# Patient Record
Sex: Male | Born: 1985
Health system: Southern US, Community
[De-identification: ages and names within clinical notes are randomized; demographics above are authoritative.]

---

## 2011-10-12 ENCOUNTER — Emergency Department: Payer: Self-pay | Admitting: Emergency Medicine

## 2014-11-16 ENCOUNTER — Emergency Department (HOSPITAL_COMMUNITY): Payer: Self-pay

## 2014-11-16 ENCOUNTER — Encounter (HOSPITAL_COMMUNITY)
Admission: EM | Disposition: A | Discharge status: Home / Self Care | Payer: Self-pay | Source: Home / Self Care | Attending: Emergency Medicine

## 2014-11-16 ENCOUNTER — Observation Stay (HOSPITAL_COMMUNITY)
Admission: EM | Admit: 2014-11-16 | Discharge: 2014-11-17 | Disposition: A | Discharge status: Home / Self Care | Payer: Self-pay | Attending: General Surgery | Admitting: General Surgery

## 2014-11-16 ENCOUNTER — Encounter (HOSPITAL_COMMUNITY): Payer: Self-pay | Admitting: Emergency Medicine

## 2014-11-16 DIAGNOSIS — Y9289 Other specified places as the place of occurrence of the external cause: Secondary | ICD-10-CM | POA: Insufficient documentation

## 2014-11-16 DIAGNOSIS — F17210 Nicotine dependence, cigarettes, uncomplicated: Secondary | ICD-10-CM | POA: Insufficient documentation

## 2014-11-16 DIAGNOSIS — Y9389 Activity, other specified: Secondary | ICD-10-CM | POA: Insufficient documentation

## 2014-11-16 DIAGNOSIS — S0121XA Laceration without foreign body of nose, initial encounter: Principal | ICD-10-CM | POA: Insufficient documentation

## 2014-11-16 DIAGNOSIS — Y998 Other external cause status: Secondary | ICD-10-CM | POA: Insufficient documentation

## 2014-11-16 DIAGNOSIS — T1490XA Injury, unspecified, initial encounter: Secondary | ICD-10-CM

## 2014-11-16 DIAGNOSIS — S41012A Laceration without foreign body of left shoulder, initial encounter: Secondary | ICD-10-CM | POA: Insufficient documentation

## 2014-11-16 DIAGNOSIS — T07XXXA Unspecified multiple injuries, initial encounter: Secondary | ICD-10-CM

## 2014-11-16 DIAGNOSIS — S31119A Laceration without foreign body of abdominal wall, unspecified quadrant without penetration into peritoneal cavity, initial encounter: Secondary | ICD-10-CM | POA: Insufficient documentation

## 2014-11-16 DIAGNOSIS — S21112A Laceration without foreign body of left front wall of thorax without penetration into thoracic cavity, initial encounter: Secondary | ICD-10-CM | POA: Insufficient documentation

## 2014-11-16 HISTORY — PX: LACERATION REPAIR: SHX5284

## 2014-11-16 LAB — CBC
HCT: 42.4 % (ref 39.0–52.0)
HEMOGLOBIN: 14.3 g/dL (ref 13.0–17.0)
MCH: 29.1 pg (ref 26.0–34.0)
MCHC: 33.7 g/dL (ref 30.0–36.0)
MCV: 86.4 fL (ref 78.0–100.0)
Platelets: 206 10*3/uL (ref 150–400)
RBC: 4.91 MIL/uL (ref 4.22–5.81)
RDW: 13.6 % (ref 11.5–15.5)
WBC: 10 10*3/uL (ref 4.0–10.5)

## 2014-11-16 LAB — PREPARE FRESH FROZEN PLASMA
UNIT DIVISION: 0
Unit division: 0

## 2014-11-16 LAB — PROTIME-INR
INR: 1.18 (ref 0.00–1.49)
PROTHROMBIN TIME: 15.2 s (ref 11.6–15.2)

## 2014-11-16 LAB — CDS SEROLOGY

## 2014-11-16 SURGERY — REPAIR, LACERATION, 2 OR MORE
Anesthesia: General | Site: Nose

## 2014-11-16 MED ORDER — FENTANYL CITRATE 0.05 MG/ML IJ SOLN
INTRAMUSCULAR | Status: AC
  Filled 2014-11-16: qty 5

## 2014-11-16 MED ORDER — ARTIFICIAL TEARS OP OINT
TOPICAL_OINTMENT | OPHTHALMIC | Status: AC
  Filled 2014-11-16: qty 3.5

## 2014-11-16 MED ORDER — CLINDAMYCIN PHOSPHATE 600 MG/50ML IV SOLN
600.0000 mg | Freq: Once | INTRAVENOUS | Status: AC
Start: 2014-11-16 — End: 2014-11-17
  Administered 2014-11-16: 600 mg via INTRAVENOUS
  Filled 2014-11-16: qty 50

## 2014-11-16 MED ORDER — TETANUS-DIPHTH-ACELL PERTUSSIS 5-2.5-18.5 LF-MCG/0.5 IM SUSP
0.5000 mL | Freq: Once | INTRAMUSCULAR | Status: AC
  Administered 2014-11-16: 0.5 mL via INTRAMUSCULAR
  Filled 2014-11-16: qty 0.5

## 2014-11-16 MED ORDER — ONDANSETRON HCL 4 MG/2ML IJ SOLN
INTRAMUSCULAR | Status: AC
  Filled 2014-11-16: qty 2

## 2014-11-16 MED ORDER — MIDAZOLAM HCL 2 MG/2ML IJ SOLN
INTRAMUSCULAR | Status: AC
  Filled 2014-11-16: qty 2

## 2014-11-16 MED ORDER — LIDOCAINE-EPINEPHRINE 1 %-1:100000 IJ SOLN
10.0000 mL | Freq: Once | INTRAMUSCULAR | Status: DC
  Filled 2014-11-16: qty 1

## 2014-11-16 MED ORDER — LACTATED RINGERS IV SOLN
INTRAVENOUS | Status: DC
  Administered 2014-11-16: via INTRAVENOUS

## 2014-11-16 MED ORDER — PROPOFOL 10 MG/ML IV BOLUS
INTRAVENOUS | Status: AC
  Filled 2014-11-16: qty 20

## 2014-11-16 MED ORDER — SUCCINYLCHOLINE CHLORIDE 20 MG/ML IJ SOLN
INTRAMUSCULAR | Status: AC
  Filled 2014-11-16: qty 1

## 2014-11-16 MED ORDER — SODIUM CHLORIDE 0.9 % IV SOLN: Freq: Once | INTRAVENOUS | Status: DC

## 2014-11-16 SURGICAL SUPPLY — 31 items
CLEANER TIP ELECTROSURG 2X2 (MISCELLANEOUS) IMPLANT
COVER SURGICAL LIGHT HANDLE (MISCELLANEOUS) ×4 IMPLANT
CRADLE DONUT ADULT HEAD (MISCELLANEOUS) ×4 IMPLANT
DRSG NASOPORE 8CM (GAUZE/BANDAGES/DRESSINGS) ×4 IMPLANT
ELECT COATED BLADE 2.86 ST (ELECTRODE) ×4 IMPLANT
ELECT NEEDLE BLADE 2-5/6 (NEEDLE) IMPLANT
ELECT NEEDLE TIP 2.8 STRL (NEEDLE) ×4 IMPLANT
ELECT REM PT RETURN 9FT ADLT (ELECTROSURGICAL) ×4
ELECTRODE REM PT RTRN 9FT ADLT (ELECTROSURGICAL) ×2 IMPLANT
GAUZE SPONGE 4X4 12PLY STRL (GAUZE/BANDAGES/DRESSINGS) ×4 IMPLANT
GAUZE SPONGE 4X4 16PLY XRAY LF (GAUZE/BANDAGES/DRESSINGS) ×4 IMPLANT
GLOVE BIOGEL PI IND STRL 7.5 (GLOVE) ×2 IMPLANT
GLOVE BIOGEL PI INDICATOR 7.5 (GLOVE) ×2
GLOVE SURG SS PI 7.5 STRL IVOR (GLOVE) ×8 IMPLANT
GOWN STRL REUS W/ TWL LRG LVL3 (GOWN DISPOSABLE) ×6 IMPLANT
GOWN STRL REUS W/TWL LRG LVL3 (GOWN DISPOSABLE) ×6
KIT BASIN OR (CUSTOM PROCEDURE TRAY) ×4 IMPLANT
KIT ROOM TURNOVER OR (KITS) ×4 IMPLANT
NEEDLE HYPO 25GX1X1/2 BEV (NEEDLE) ×8 IMPLANT
NS IRRIG 1000ML POUR BTL (IV SOLUTION) ×4 IMPLANT
PAD ARMBOARD 7.5X6 YLW CONV (MISCELLANEOUS) ×8 IMPLANT
PENCIL BUTTON HOLSTER BLD 10FT (ELECTRODE) ×4 IMPLANT
SUT CHROMIC 4 0 P 3 18 (SUTURE) ×4 IMPLANT
SUT PROLENE 3 0 PS 2 (SUTURE) ×16 IMPLANT
SUT VIC AB 3-0 SH 27 (SUTURE) ×2
SUT VIC AB 3-0 SH 27X BRD (SUTURE) ×2 IMPLANT
SUT VICRYL 4 0 TF (SUTURE) ×4 IMPLANT
SYR CONTROL 10ML LL (SYRINGE) ×8 IMPLANT
TOWEL OR 17X24 6PK STRL BLUE (TOWEL DISPOSABLE) ×12 IMPLANT
TRAY CATH 16FR W/PLASTIC CATH (SET/KITS/TRAYS/PACK) ×4 IMPLANT
TRAY ENT MC OR (CUSTOM PROCEDURE TRAY) ×4 IMPLANT

## 2014-11-16 NOTE — Anesthesia Preprocedure Evaluation (Signed)
Anesthesia Evaluation  Patient identified by MRN, date of birth, ID band Patient awake    Reviewed: Allergy & Precautions, NPO status , Patient's Chart, lab work & pertinent test results  Airway Mallampati: II  TM Distance: >3 FB Neck ROM: Full    Dental no notable dental hx.    Pulmonary Current Smoker,  breath sounds clear to auscultation  Pulmonary exam normal       Cardiovascular negative cardio ROS  Rhythm:Regular Rate:Normal     Neuro/Psych negative neurological ROS  negative psych ROS   GI/Hepatic negative GI ROS, Neg liver ROS,   Endo/Other  negative endocrine ROS  Renal/GU negative Renal ROS     Musculoskeletal negative musculoskeletal ROS (+)   Abdominal   Peds  Hematology negative hematology ROS (+)   Anesthesia Other Findings   Reproductive/Obstetrics negative OB ROS                             Anesthesia Physical Anesthesia Plan  ASA: II  Anesthesia Plan: General   Post-op Pain Management:    Induction: Intravenous, Rapid sequence and Cricoid pressure planned  Airway Management Planned: Oral ETT  Additional Equipment: None  Intra-op Plan:   Post-operative Plan: Extubation in OR  Informed Consent: I have reviewed the patients History and Physical, chart, labs and discussed the procedure including the risks, benefits and alternatives for the proposed anesthesia with the patient or authorized representative who has indicated his/her understanding and acceptance.   Dental advisory given  Plan Discussed with: CRNA  Anesthesia Plan Comments:         Anesthesia Quick Evaluation

## 2014-11-16 NOTE — ED Notes (Signed)
P[facility] E[facility]
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
## 2014-11-16 NOTE — H&P (Signed)
11/16/2014  Michael Lester, Michael Lester  PREOPERATIVE HISTORY AND PHYSICAL/CONSULT NOTE  CHIEF COMPLAINT: nasal laceration  HISTORY: This is a 29 year old who presents with complex nasal laceration today.  He now presents for complex repair nasal laceration.  Dr. Emeline DarlingGore, Clovis RileyMitchell has discussed the risks (bleeding, infection, scarring, etc.), benefits, and alternatives of this procedure. The patient understands the risks and would like to proceed with the procedure. The chances of success of the procedure are >50% and the patient understands this. I personally performed an examination of the patient within 24 hours of the procedure.  PAST MEDICAL HISTORY: History reviewed. No pertinent past medical history.  PAST SURGICAL HISTORY: History reviewed. No pertinent past surgical history.  MEDICATIONS: No current facility-administered medications on file prior to encounter.   No current outpatient prescriptions on file prior to encounter.     ALLERGIES: Not on File    SOCIAL HISTORY: History   Social History  . Marital Status: N/A    Spouse Name: N/A    Number of Children: N/A  . Years of Education: N/A   Occupational History  . Not on file.   Social History Main Topics  . Smoking status: Current Some Day Smoker  . Smokeless tobacco: Not on file  . Alcohol Use: Yes  . Drug Use: No  . Sexual Activity: Not on file   Other Topics Concern  . Not on file   Social History Narrative  . No narrative on file    FAMILY HISTORY:No family history on file.  REVIEW OF SYSTEMS:  HEENT: nasal pain, otherwise negative x 12 systems except per HPI  PHYSICAL EXAM:  GENERAL:  NAD VITAL SIGNS:   Filed Vitals:   11/16/14 2305  BP: 120/71  Temp: 98.4 F (36.9 C)  Resp: 14   SKIN:  Warm, dry HEENT:  3cm nasal laceration NECK:  siupple LYMPH:  No LAD ABDOMEN:  soft MUSCULOSKELETAL: normal strength PSYCH:  Normal affect NEUROLOGIC:  CN 2-12 intact and symmetric   ASSESSMENT AND  PLAN: Plan to proceed with complex repair of nasal laceration: discussed the risks, benefits, and alternatives. Informed written consent signed witnessed and on chart. 11/16/2014  11:37 PM Michael Lester, Michael Lester
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
## 2014-11-17 ENCOUNTER — Emergency Department (HOSPITAL_COMMUNITY): Payer: Self-pay | Admitting: Anesthesiology

## 2014-11-17 ENCOUNTER — Encounter (HOSPITAL_COMMUNITY): Payer: Self-pay | Admitting: Otolaryngology

## 2014-11-17 DIAGNOSIS — T07XXXA Unspecified multiple injuries, initial encounter: Secondary | ICD-10-CM

## 2014-11-17 LAB — TYPE AND SCREEN
ABO/RH(D): O POS
Antibody Screen: NEGATIVE
UNIT DIVISION: 0
Unit division: 0

## 2014-11-17 LAB — COMPREHENSIVE METABOLIC PANEL
ALBUMIN: 4.2 g/dL (ref 3.5–5.2)
ALT: 15 U/L (ref 0–53)
AST: 25 U/L (ref 0–37)
Alkaline Phosphatase: 84 U/L (ref 39–117)
Anion gap: 23 — ABNORMAL HIGH (ref 5–15)
BILIRUBIN TOTAL: 0.4 mg/dL (ref 0.3–1.2)
BUN: 15 mg/dL (ref 6–23)
CHLORIDE: 103 meq/L (ref 96–112)
CO2: 11 mmol/L — ABNORMAL LOW (ref 19–32)
Calcium: 8.9 mg/dL (ref 8.4–10.5)
Creatinine, Ser: 1.53 mg/dL — ABNORMAL HIGH (ref 0.50–1.35)
GFR calc Af Amer: 69 mL/min — ABNORMAL LOW (ref >90)
GFR calc non Af Amer: 60 mL/min — ABNORMAL LOW (ref >90)
Glucose, Bld: 194 mg/dL — ABNORMAL HIGH (ref 70–99)
POTASSIUM: 4.1 mmol/L (ref 3.5–5.1)
Sodium: 137 mmol/L (ref 135–145)
Total Protein: 6.6 g/dL (ref 6.0–8.3)

## 2014-11-17 LAB — BLOOD PRODUCT ORDER (VERBAL) VERIFICATION

## 2014-11-17 LAB — ABO/RH: ABO/RH(D): O POS

## 2014-11-17 LAB — ETHANOL: Alcohol, Ethyl (B): 102 mg/dL — ABNORMAL HIGH (ref 0–9)

## 2014-11-17 MED ORDER — OXYCODONE HCL 5 MG PO TABS
5.0000 mg | ORAL_TABLET | ORAL | Status: DC | PRN
  Administered 2014-11-17: 5 mg via ORAL
  Filled 2014-11-17: qty 1

## 2014-11-17 MED ORDER — LIDOCAINE HCL (CARDIAC) 20 MG/ML IV SOLN
INTRAVENOUS | Status: DC | PRN
  Administered 2014-11-17: 80 mg via INTRAVENOUS

## 2014-11-17 MED ORDER — PHENYLEPHRINE 40 MCG/ML (10ML) SYRINGE FOR IV PUSH (FOR BLOOD PRESSURE SUPPORT)
PREFILLED_SYRINGE | INTRAVENOUS | Status: AC
  Filled 2014-11-17: qty 10

## 2014-11-17 MED ORDER — FENTANYL CITRATE 0.05 MG/ML IJ SOLN
INTRAMUSCULAR | Status: DC | PRN
  Administered 2014-11-17: 100 ug via INTRAVENOUS
  Administered 2014-11-17: 50 ug via INTRAVENOUS

## 2014-11-17 MED ORDER — ARTIFICIAL TEARS OP OINT
TOPICAL_OINTMENT | OPHTHALMIC | Status: AC
  Filled 2014-11-17: qty 3.5

## 2014-11-17 MED ORDER — OXYCODONE-ACETAMINOPHEN 5-325 MG PO TABS: 1.0000 | ORAL_TABLET | ORAL | Status: DC | PRN

## 2014-11-17 MED ORDER — BACITRACIN ZINC 500 UNIT/GM EX OINT
TOPICAL_OINTMENT | Freq: Two times a day (BID) | CUTANEOUS | Status: DC
  Administered 2014-11-17: 15.5556 via TOPICAL
  Filled 2014-11-17: qty 28.35

## 2014-11-17 MED ORDER — HYDROMORPHONE HCL 1 MG/ML IJ SOLN
1.0000 mg | INTRAMUSCULAR | Status: DC | PRN
Start: 2014-11-17 — End: 2014-11-17
  Administered 2014-11-17 (×2): 1 mg via INTRAVENOUS
  Filled 2014-11-17 (×2): qty 1

## 2014-11-17 MED ORDER — 0.9 % SODIUM CHLORIDE (POUR BTL) OPTIME
TOPICAL | Status: DC | PRN
Start: 2014-11-17 — End: 2014-11-17
  Administered 2014-11-17: 1000 mL

## 2014-11-17 MED ORDER — DEXAMETHASONE SODIUM PHOSPHATE 4 MG/ML IJ SOLN
INTRAMUSCULAR | Status: AC
  Filled 2014-11-17: qty 1

## 2014-11-17 MED ORDER — PROPOFOL 10 MG/ML IV BOLUS
INTRAVENOUS | Status: DC | PRN
  Administered 2014-11-17: 200 mg via INTRAVENOUS

## 2014-11-17 MED ORDER — HEMOSTATIC AGENTS (NO CHARGE) OPTIME
TOPICAL | Status: DC | PRN
  Administered 2014-11-17 (×2): 1 via TOPICAL

## 2014-11-17 MED ORDER — MEPERIDINE HCL 25 MG/ML IJ SOLN: 6.2500 mg | INTRAMUSCULAR | Status: DC | PRN

## 2014-11-17 MED ORDER — MIDAZOLAM HCL 5 MG/5ML IJ SOLN
INTRAMUSCULAR | Status: DC | PRN
  Administered 2014-11-17: 2 mg via INTRAVENOUS

## 2014-11-17 MED ORDER — SUCCINYLCHOLINE CHLORIDE 20 MG/ML IJ SOLN
INTRAMUSCULAR | Status: DC | PRN
  Administered 2014-11-17: 120 mg via INTRAVENOUS

## 2014-11-17 MED ORDER — ONDANSETRON HCL 4 MG/2ML IJ SOLN: 4.0000 mg | Freq: Four times a day (QID) | INTRAMUSCULAR | Status: DC | PRN

## 2014-11-17 MED ORDER — ONDANSETRON HCL 4 MG PO TABS: 4.0000 mg | ORAL_TABLET | Freq: Four times a day (QID) | ORAL | Status: DC | PRN

## 2014-11-17 MED ORDER — HYDROMORPHONE HCL 1 MG/ML IJ SOLN: 0.5000 mg | INTRAMUSCULAR | Status: DC | PRN

## 2014-11-17 MED ORDER — BACITRACIN-NEOMYCIN-POLYMYXIN OINTMENT TUBE
TOPICAL_OINTMENT | Freq: Three times a day (TID) | CUTANEOUS | Status: DC
  Administered 2014-11-17: 10:00:00 via TOPICAL
  Filled 2014-11-17: qty 15

## 2014-11-17 MED ORDER — PROMETHAZINE HCL 25 MG/ML IJ SOLN
6.2500 mg | INTRAMUSCULAR | Status: DC | PRN
  Filled 2014-11-17: qty 1

## 2014-11-17 MED ORDER — LACTATED RINGERS IV SOLN
INTRAVENOUS | Status: DC | PRN
  Administered 2014-11-17 (×2): via INTRAVENOUS

## 2014-11-17 MED ORDER — BACITRACIN 500 UNIT/GM EX OINT
TOPICAL_OINTMENT | CUTANEOUS | Status: DC | PRN
  Administered 2014-11-17: 1 via TOPICAL

## 2014-11-17 MED ORDER — DEXAMETHASONE SODIUM PHOSPHATE 4 MG/ML IJ SOLN
INTRAMUSCULAR | Status: DC | PRN
  Administered 2014-11-17: 4 mg via INTRAVENOUS

## 2014-11-17 MED ORDER — HYDROMORPHONE HCL 1 MG/ML IJ SOLN
0.2500 mg | INTRAMUSCULAR | Status: DC | PRN
  Administered 2014-11-17 (×4): 0.5 mg via INTRAVENOUS

## 2014-11-17 MED ORDER — POTASSIUM CHLORIDE IN NACL 20-0.9 MEQ/L-% IV SOLN
INTRAVENOUS | Status: DC
  Administered 2014-11-17: 04:00:00 via INTRAVENOUS
  Filled 2014-11-17 (×3): qty 1000

## 2014-11-17 MED ORDER — ARTIFICIAL TEARS OP OINT
TOPICAL_OINTMENT | OPHTHALMIC | Status: DC | PRN
  Administered 2014-11-17: 1 via OPHTHALMIC

## 2014-11-17 MED ORDER — LIDOCAINE-EPINEPHRINE 1 %-1:100000 IJ SOLN
INTRAMUSCULAR | Status: DC | PRN
  Administered 2014-11-17: 16 mL

## 2014-11-17 MED ORDER — ONDANSETRON HCL 4 MG/2ML IJ SOLN
INTRAMUSCULAR | Status: DC | PRN
  Administered 2014-11-17: 4 mg via INTRAVENOUS
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
## 2014-11-17 NOTE — Progress Notes (Signed)
UR completed 

## 2014-11-17 NOTE — Anesthesia Procedure Notes (Signed)
Procedure Name: Intubation Date/Time: 11/17/2014 12:21 AM Performed by: Julianne RiceBILOTTA, Michael Lester Z Pre-anesthesia Checklist: Patient identified, Patient being monitored, Timeout performed, Emergency Drugs available and Suction available Patient Re-evaluated:Patient Re-evaluated prior to inductionOxygen Delivery Method: Circle system utilized Intubation Type: IV induction, Rapid sequence and Cricoid Pressure applied Laryngoscope Size: Mac and 4 Grade View: Grade I Tube type: Oral Tube size: 8.0 mm Number of attempts: 1 Airway Equipment and Method: Stylet Placement Confirmation: ETT inserted through vocal cords under direct vision,  breath sounds checked- equal and bilateral and positive ETCO2 Secured at: 24 cm Tube secured with: Tape Dental Injury: Teeth and Oropharynx as per pre-operative assessment and Bloody posterior oropharynx
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
## 2014-11-17 NOTE — Progress Notes (Signed)
Patient took shower independently.  Noted that nasal packing had been removed.  Patient stated that he "couldn't take it anymore" and removed it himself.  Charma IgoMichael Jeffery, PA-C informed.  Discussed importance of avoidance of increased nasal pressure, such as in sneezing or nose - blowing with patient.  Reviewed prescriptions, follow up appointments, and care of incisions with bacitracin with patient.  Ascertained comprehension of instructions via 'teach-back' method.

## 2014-11-17 NOTE — Transfer of Care (Signed)
Immediate Anesthesia Transfer of Care Note  Patient: Michael MedalCarl J Degner  Procedure(s) Performed: Procedure(s) with comments: REPAIR MULTIPLE LACERATIONS (N/A) REPAIR MULTIPLE LACERATIONS (Left) - irrigation and suturing of lacerations on upper body  Patient Location: PACU  Anesthesia Type:General  Level of Consciousness: awake, alert , oriented and patient cooperative  Airway & Oxygen Therapy: Patient Spontanous Breathing and Patient connected to face mask oxygen  Post-op Assessment: Report given to PACU RN and Post -op Vital signs reviewed and stable  Post vital signs: Reviewed and stable  Complications: No apparent anesthesia complications

## 2014-11-17 NOTE — Discharge Instructions (Signed)
Wash wounds daily in shower with soap and water. °Do not soak. °Apply antibiotic ointment (e.g. Neosporin) twice daily and as needed to keep moist. °Cover with dry dressing. ° °No driving while taking oxycodone. °

## 2014-11-17 NOTE — Op Note (Signed)
DATE OF OPERATION: 11/17/2014 Surgeon: Melvenia BeamGore, Arie Powell Procedure Performed: complex repair 3cm nasal laceration  PREOPERATIVE DIAGNOSIS: 3cm nasal laceration  POSTOPERATIVE DIAGNOSIS: 3cm nasal laceration SURGEON: Melvenia BeamGore, Olando Willems ANESTHESIA: General endotracheal.  ESTIMATED BLOOD LOSS: less than 25mL DRAINS: none SPECIMENS: none DRESSINGS: surgiflo and nasopore to bilateral nasal cavities INDICATIONS: The patient is a 28yo with a history of 3cm nasal laceration. DESCRIPTION OF OPERATION: The patient was brought to the operating room and was placed in the supine position and was placed under general endotracheal anesthesia by anesthesiology. The 3cm left nasal laceration traversing and separating the left lower lateral and upper lateral cartilages from the septum was washed out and prepped with copious sterile betadine and then draped and injected with 1% lidocaine with epinephrine. I then closed the mucosal/cartilage and subcutaneous layers with deep buried, interrupted 4-0 vicryl sutures, and then closed the nasal skin with interrupted simple 4-0 chromic gut sutures. There was some oozing from the bilateral nasal cavities so surgiflo and then 4cm of nasopore was placed in the anterior nasal cavity bilaterally. Hemostasis was achieved. The nasal laceration was dressed with bacitracin ointment. The patient was turned back to anesthesia and awakened from anesthesia and extubated without difficulty. The patient tolerated the procedure well with no immediate complications and was taken to the postoperative recovery area in good condition.   Dr. Melvenia BeamMitchell Hortence Charter was present and performed the entire procedure. 11/17/2014  1:07 AM Melvenia BeamGore, Yohann Curl

## 2014-11-18 NOTE — Anesthesia Postprocedure Evaluation (Signed)
Anesthesia Post Note  Patient: Michael Lester  Procedure(s) Performed: Procedure(s) (LRB): REPAIR MULTIPLE LACERATIONS (N/A) REPAIR MULTIPLE LACERATIONS (Left)  Anesthesia type: General  Patient location: PACU  Post pain: Pain level controlled  Post assessment: Post-op Vital signs reviewed  Last Vitals: BP 126/66 mmHg  Pulse 106  Temp(Src) 36.9 C (Oral)  Resp 18  Ht 5' 9.02" (1.753 m)  Wt 155 lb 12.8 oz (70.67 kg)  BMI 23.00 kg/m2  SpO2 98%  Post vital signs: Reviewed  Level of consciousness: sedated  Complications: No apparent anesthesia complications
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
## 2016-04-17 ENCOUNTER — Emergency Department: Payer: Self-pay

## 2016-04-17 ENCOUNTER — Encounter: Payer: Self-pay | Admitting: Emergency Medicine

## 2016-04-17 ENCOUNTER — Emergency Department
Admission: EM | Admit: 2016-04-17 | Discharge: 2016-04-17 | Disposition: A | Discharge status: Home / Self Care | Payer: Self-pay | Attending: Emergency Medicine | Admitting: Emergency Medicine

## 2016-04-17 DIAGNOSIS — W2201XA Walked into wall, initial encounter: Secondary | ICD-10-CM | POA: Insufficient documentation

## 2016-04-17 DIAGNOSIS — S62317A Displaced fracture of base of fifth metacarpal bone. left hand, initial encounter for closed fracture: Secondary | ICD-10-CM | POA: Insufficient documentation

## 2016-04-17 DIAGNOSIS — Y929 Unspecified place or not applicable: Secondary | ICD-10-CM | POA: Insufficient documentation

## 2016-04-17 DIAGNOSIS — S6292XA Unspecified fracture of left wrist and hand, initial encounter for closed fracture: Secondary | ICD-10-CM

## 2016-04-17 DIAGNOSIS — Y939 Activity, unspecified: Secondary | ICD-10-CM | POA: Insufficient documentation

## 2016-04-17 DIAGNOSIS — Y999 Unspecified external cause status: Secondary | ICD-10-CM | POA: Insufficient documentation
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
## 2016-04-17 NOTE — ED Notes (Signed)
Discharge instructions reviewed with patient. Patient verbalized understanding. Patient ambulated to lobby without difficulty.   

## 2016-04-17 NOTE — Discharge Instructions (Signed)
Cast or Splint Care Casts and splints support injured limbs and keep bones from moving while they heal. It is important to care for your cast or splint at home.  HOME CARE INSTRUCTIONS  Keep the cast or splint uncovered during the drying period. It can take 24 to 48 hours to dry if it is made of plaster. A fiberglass cast will dry in less than 1 hour.  Do not rest the cast on anything harder than a pillow for the first 24 hours.  Do not put weight on your injured limb or apply pressure to the cast until your health care provider gives you permission.  Keep the cast or splint dry. Wet casts or splints can lose their shape and may not support the limb as well. A wet cast that has lost its shape can also create harmful pressure on your skin when it dries. Also, wet skin can become infected.  Cover the cast or splint with a plastic bag when bathing or when out in the rain or snow. If the cast is on the trunk of the body, take sponge baths until the cast is removed.  If your cast does become wet, dry it with a towel or a blow dryer on the cool setting only.  Keep your cast or splint clean. Soiled casts may be wiped with a moistened cloth.  Do not place any hard or soft foreign objects under your cast or splint, such as cotton, toilet paper, lotion, or powder.  Do not try to scratch the skin under the cast with any object. The object could get stuck inside the cast. Also, scratching could lead to an infection. If itching is a problem, use a blow dryer on a cool setting to relieve discomfort.  Do not trim or cut your cast or remove padding from inside of it.  Exercise all joints next to the injury that are not immobilized by the cast or splint. For example, if you have a long leg cast, exercise the hip joint and toes. If you have an arm cast or splint, exercise the shoulder, elbow, thumb, and fingers.  Elevate your injured arm or leg on 1 or 2 pillows for the first 1 to 3 days to decrease  swelling and pain.It is best if you can comfortably elevate your cast so it is higher than your heart. SEEK MEDICAL CARE IF:   Your cast or splint cracks.  Your cast or splint is too tight or too loose.  You have unbearable itching inside the cast.  Your cast becomes wet or develops a soft spot or area.  You have a bad smell coming from inside your cast.  You get an object stuck under your cast.  Your skin around the cast becomes red or raw.  You have new pain or worsening pain after the cast has been applied. SEEK IMMEDIATE MEDICAL CARE IF:   You have fluid leaking through the cast.  You are unable to move your fingers or toes.  You have discolored (blue or white), cool, painful, or very swollen fingers or toes beyond the cast.  You have tingling or numbness around the injured area.  You have severe pain or pressure under the cast.  You have any difficulty with your breathing or have shortness of breath.  You have chest pain.   This information is not intended to replace advice given to you by your health care provider. Make sure you discuss any questions you have with your health care  provider.   Document Released: 10/12/2000 Document Revised: 08/05/2013 Document Reviewed: 04/23/2013 Elsevier Interactive Patient Education 2016 [employer]  Rest, ice , and elevate both hands

## 2017-01-13 ENCOUNTER — Encounter: Payer: Self-pay | Admitting: Emergency Medicine

## 2017-01-13 ENCOUNTER — Emergency Department
Admission: EM | Admit: 2017-01-13 | Discharge: 2017-01-13 | Disposition: A | Discharge status: Home / Self Care | Payer: Self-pay | Attending: Student in an Organized Health Care Education/Training Program | Admitting: Student in an Organized Health Care Education/Training Program

## 2017-01-13 DIAGNOSIS — M79671 Pain in right foot: Secondary | ICD-10-CM

## 2017-01-13 DIAGNOSIS — B353 Tinea pedis: Secondary | ICD-10-CM | POA: Insufficient documentation

## 2017-01-13 DIAGNOSIS — M79672 Pain in left foot: Secondary | ICD-10-CM

## 2017-01-13 DIAGNOSIS — F17200 Nicotine dependence, unspecified, uncomplicated: Secondary | ICD-10-CM | POA: Insufficient documentation

## 2017-01-13 MED ORDER — CLOTRIMAZOLE 1 % EX SOLN: 1.0000 "application " | Freq: Two times a day (BID) | CUTANEOUS | 0 refills | Status: DC

## 2017-01-13 MED ORDER — AMOXICILLIN-POT CLAVULANATE 875-125 MG PO TABS: 1.0000 | ORAL_TABLET | Freq: Two times a day (BID) | ORAL | 0 refills | Status: AC

## 2017-01-13 NOTE — ED Triage Notes (Signed)
Patient reports foot pain that started 2 days ago. Patient states that today he had blisters that started on the bottom of his feet.

## 2017-01-13 NOTE — ED Provider Notes (Signed)
[facility] Emergency Department Provider Note    First MD Initiated Contact with Patient 01/13/17 [phone]     (approximate)  I have reviewed the triage vital signs and the nursing notes.   HISTORY  Chief Complaint Foot Pain    HPI Michael Lester is a 31 y.o. male history of polysubstance abuse and admits to using heroin yesterday presents with chief complaint of bilateral foot pain that became acutely worse today. States he has a history of athlete's foot. States that yesterday he noted blisters on his toes bilaterally so he tried to pop them with a needle. States that he did feel some improvement in symptoms after that but today the burning in his toes became significantly worse. No fevers. Denies any nausea or vomiting. No chest pain or shortness of breath.  Able to ambulate but states that it does burn when he puts shoes on her is walking.   History reviewed. No pertinent past medical history. FMH: no bleeding disorders Past Surgical History:  Procedure Laterality Date  . LACERATION REPAIR N/A 11/16/2014   Procedure: REPAIR MULTIPLE LACERATIONS;  Surgeon: Melvenia BeamMitchell Gore, MD;  [facility];  Service: ENT;  Laterality: N/A;  . LACERATION REPAIR Left 11/16/2014   Procedure: REPAIR MULTIPLE LACERATIONS;  Surgeon: Manus RuddMatthew Tsuei, MD;  Location: [facility] OR;  Service: General;  Laterality: Left;  irrigation and suturing of lacerations on upper body   Patient Active Problem List   Diagnosis Date Noted  . Multiple lacerations 11/17/2014      Prior to Admission medications   Medication Sig Start Date End Date Taking? Authorizing Provider  oxyCODONE-acetaminophen (ROXICET) 5-325 MG per tablet Take 1-2 tablets by mouth every 4 (four) hours as needed (Pain). 11/17/14   Freeman CaldronMichael J Jeffery, PA-C    Allergies Shellfish allergy    Social History Social History  Substance Use Topics  . Smoking status: Current Some Day Smoker  . Smokeless tobacco: Never Used  .  Alcohol use Yes    Review of Systems Patient denies headaches, rhinorrhea, blurry vision, numbness, shortness of breath, chest pain, edema, cough, abdominal pain, nausea, vomiting, diarrhea, dysuria, fevers, rashes or hallucinations unless otherwise stated above in HPI. ____________________________________________   PHYSICAL EXAM:  VITAL SIGNS: Vitals:   01/13/17 0440 01/13/17 0551  BP: (!) 131/57 117/72  Pulse: (!) 118 90  Resp: 20 18  Temp: 98.6 F (37 C)     Constitutional: Alert and oriented. Disheveled appearing but in no acute distress Eyes: Conjunctivae are normal. Head: Atraumatic.  Scattered superficial abrasions to face from picking Nose: No congestion/rhinnorhea. Mouth/Throat: Mucous membranes are moist.  Oropharynx non-erythematous. Neck: No stridor. Painless ROM. No cervical spine tenderness to palpation Hematological/Lymphatic/Immunilogical: No cervical lymphadenopathy. Cardiovascular: Normal rate, regular rhythm. Grossly normal heart sounds.  Good peripheral circulation. Respiratory: Normal respiratory effort.  No retractions. Lungs CTAB. Gastrointestinal: Soft and nontender. No distention. No abdominal bruits. No CVA tenderness. Musculoskeletal: No lower extremity tenderness nor edema.  No joint effusions.  BLE with erythematous toes particularly between digits.  Old, popped blisters without purulent drainage.  No crepitus.  No streaking erythema Neurologic:  Normal speech and language. No gross focal neurologic deficits are appreciated. No gait instability. Skin:  Skin is warm, dry and intact. No rash noted. Psychiatric: Mood and affect are normal. Speech and behavior are normal.  ____________________________________________   LABS (all labs ordered are listed, but only abnormal results are displayed)  No results found for this or any previous visit (from  the past 24  hour(s)). ____________________________________________  EKG____________________________________________   PROCEDURES  Procedure(s) performed:  Procedures    Critical Care performed: no ____________________________________________   INITIAL IMPRESSION / ASSESSMENT AND PLAN / ED COURSE  Pertinent labs & imaging results that were available during my care of the patient were reviewed by me and considered in my medical decision making (see chart for details).  DDX: tinea, cellultiis, nsti  Michael Lester is a 30 y.o. who presents to the ED with chief complaint of bilateral foot pain. Patient without any evidence of stomach illness disease afebrile. Does appear disheveled and does admit to history of polysubstance abuse but his exam appears more consistent with tenia with possible superimposed infection. No evidence of deep space infection as he does not have any pain out of proportion to exam. Painless passive flexion and extension of his toes. There is no crepitus. Will treat patient with low treatment as well as antibiotics for area of overlying cellulitis.  Have discussed with the patient and available family all diagnostics and treatments performed thus far and all questions were answered to the best of my ability. The patient demonstrates understanding and agreement with plan.       ____________________________________________   FINAL CLINICAL IMPRESSION(S) / ED DIAGNOSES  Final diagnoses:  Foot pain, bilateral  Tinea pedis of both feet      NEW MEDICATIONS STARTED DURING THIS VISIT:  New Prescriptions   No medications on file     Note:  This document was prepared using Dragon voice recognition software and may include unintentional dictation errors.    Willy Eddy, MD 01/13/17 0800

## 2018-11-20 ENCOUNTER — Other Ambulatory Visit: Payer: Self-pay

## 2018-11-20 ENCOUNTER — Emergency Department
Admission: EM | Admit: 2018-11-20 | Discharge: 2018-11-20 | Disposition: A | Discharge status: Home / Self Care | Payer: Self-pay | Attending: Emergency Medicine | Admitting: Emergency Medicine

## 2018-11-20 DIAGNOSIS — L02413 Cutaneous abscess of right upper limb: Secondary | ICD-10-CM | POA: Insufficient documentation

## 2018-11-20 DIAGNOSIS — L0291 Cutaneous abscess, unspecified: Secondary | ICD-10-CM

## 2018-11-20 DIAGNOSIS — T401X1A Poisoning by heroin, accidental (unintentional), initial encounter: Secondary | ICD-10-CM

## 2018-11-20 DIAGNOSIS — T401X4A Poisoning by heroin, undetermined, initial encounter: Secondary | ICD-10-CM | POA: Insufficient documentation

## 2018-11-20 DIAGNOSIS — F17210 Nicotine dependence, cigarettes, uncomplicated: Secondary | ICD-10-CM | POA: Insufficient documentation

## 2018-11-20 LAB — COMPREHENSIVE METABOLIC PANEL
ALT: 17 U/L (ref 0–44)
ANION GAP: 9 (ref 5–15)
AST: 22 U/L (ref 15–41)
Albumin: 4.1 g/dL (ref 3.5–5.0)
Alkaline Phosphatase: 91 U/L (ref 38–126)
BUN: 19 mg/dL (ref 6–20)
CHLORIDE: 106 mmol/L (ref 98–111)
CO2: 25 mmol/L (ref 22–32)
Calcium: 8.8 mg/dL — ABNORMAL LOW (ref 8.9–10.3)
Creatinine, Ser: 1.04 mg/dL (ref 0.61–1.24)
GFR calc non Af Amer: >60 mL/min (ref >60)
Glucose, Bld: 195 mg/dL — ABNORMAL HIGH (ref 70–99)
POTASSIUM: 3.1 mmol/L — AB (ref 3.5–5.1)
SODIUM: 140 mmol/L (ref 135–145)
Total Bilirubin: 0.3 mg/dL (ref 0.3–1.2)
Total Protein: 7.8 g/dL (ref 6.5–8.1)

## 2018-11-20 LAB — CBC WITH DIFFERENTIAL/PLATELET
ABS IMMATURE GRANULOCYTES: 0.1 10*3/uL — AB (ref 0.00–0.07)
BASOS PCT: 1 %
Basophils Absolute: 0.1 10*3/uL (ref 0.0–0.1)
EOS ABS: 0.4 10*3/uL (ref 0.0–0.5)
EOS PCT: 2 %
HCT: 39 % (ref 39.0–52.0)
Hemoglobin: 12.2 g/dL — ABNORMAL LOW (ref 13.0–17.0)
Immature Granulocytes: 1 %
LYMPHS ABS: 8.2 10*3/uL — AB (ref 0.7–4.0)
Lymphocytes Relative: 45 %
MCH: 25.3 pg — ABNORMAL LOW (ref 26.0–34.0)
MCHC: 31.3 g/dL (ref 30.0–36.0)
MCV: 80.9 fL (ref 80.0–100.0)
MONOS PCT: 5 %
Monocytes Absolute: 0.9 10*3/uL (ref 0.1–1.0)
Neutro Abs: 8.4 10*3/uL — ABNORMAL HIGH (ref 1.7–7.7)
Neutrophils Relative %: 46 %
PLATELETS: 302 10*3/uL (ref 150–400)
RBC: 4.82 MIL/uL (ref 4.22–5.81)
RDW: 16.4 % — AB (ref 11.5–15.5)
WBC: 18.1 10*3/uL — ABNORMAL HIGH (ref 4.0–10.5)
nRBC: 0 % (ref 0.0–0.2)

## 2018-11-20 LAB — ACETAMINOPHEN LEVEL

## 2018-11-20 LAB — SALICYLATE LEVEL

## 2018-11-20 LAB — ETHANOL

## 2018-11-20 MED ORDER — SODIUM CHLORIDE 0.9 % IV SOLN: Freq: Once | INTRAVENOUS | Status: DC

## 2018-11-20 MED ORDER — SULFAMETHOXAZOLE-TRIMETHOPRIM 800-160 MG PO TABS
1.0000 | ORAL_TABLET | Freq: Once | ORAL | Status: AC
  Administered 2018-11-20: 1 via ORAL
  Filled 2018-11-20: qty 1

## 2018-11-20 MED ORDER — SULFAMETHOXAZOLE-TRIMETHOPRIM 800-160 MG PO TABS: 1.0000 | ORAL_TABLET | Freq: Two times a day (BID) | ORAL | 0 refills | Status: DC

## 2018-11-20 MED ORDER — SODIUM CHLORIDE 0.9 % IV BOLUS: 1000.0000 mL | Freq: Once | INTRAVENOUS | Status: DC

## 2018-11-20 NOTE — ED Notes (Signed)
Wife called in looking for pt. He was given phone to speak to her

## 2018-11-20 NOTE — ED Triage Notes (Signed)
Pt to ED via POV with friend. Per friend pt OD on Heroin and was unconscious at home. Pt alert but groggy now pt states he took .1g heroin 2hrs ago and had 2 shots of vodka 4hrs ago. Pt a&o x4, nad.

## 2018-11-20 NOTE — ED Notes (Signed)
Pt attempting to get out of bed.  Pt reoriented per this RN.  Pt expresses that he wishes to sign AMA papers and leave, states, "I will get medical attention but I would rather leave and go to [facility]."  EDP notified and to bedside at this time.

## 2018-11-20 NOTE — ED Provider Notes (Signed)
[facility] Emergency Department Provider Note  ____________________________________________   First MD Initiated Contact with Patient 11/20/18 2049     (approximate)  I have reviewed the triage vital signs and the nursing notes.   HISTORY  Chief Complaint Drug Overdose   HPI Michael Lester is a 33 y.o. male with a history of IV drug use who is presenting to emergency department today with an overdose.  He states that he thinks he passed out.  Was brought in by a friend.  Says that he was using heroin and injected himself in his left antecubital fossa.  Denying any pain at this time.  Says that he also drank alcohol earlier.  Says that he is trying to wean himself down off of heroin.  However, still uses daily.  Also with a "boil" to his right forearm which he said burst yesterday.   History reviewed. No pertinent past medical history.  Patient Active Problem List   Diagnosis Date Noted  . Multiple lacerations 11/17/2014    Past Surgical History:  Procedure Laterality Date  . LACERATION REPAIR N/A 11/16/2014   Procedure: REPAIR MULTIPLE LACERATIONS;  Surgeon: Melvenia BeamMitchell Gore, MD;  [facility];  Service: ENT;  Laterality: N/A;  . LACERATION REPAIR Left 11/16/2014   Procedure: REPAIR MULTIPLE LACERATIONS;  Surgeon: Manus RuddMatthew Tsuei, MD;  Location: [facility] OR;  Service: General;  Laterality: Left;  irrigation and suturing of lacerations on upper body    Prior to Admission medications   Medication Sig Start Date End Date Taking? Authorizing Provider  clotrimazole (LOTRIMIN) 1 % external solution Apply 1 application topically 2 (two) times daily. 01/13/17   Willy Eddyobinson, Patrick, MD  oxyCODONE-acetaminophen (ROXICET) 5-325 MG per tablet Take 1-2 tablets by mouth every 4 (four) hours as needed (Pain). 11/17/14   Freeman CaldronJeffery, Michael J, PA-C    Allergies Shellfish allergy  No family history on file.  Social History Social History   Tobacco Use  . Smoking status:  Current Some Day Smoker  . Smokeless tobacco: Never Used  Substance Use Topics  . Alcohol use: Yes  . Drug use: Yes    Types: Marijuana    Review of Systems  Constitutional: No fever/chills Eyes: No visual changes. ENT: No sore throat. Cardiovascular: Denies chest pain. Respiratory: Denies shortness of breath. Gastrointestinal: No abdominal pain.  No nausea, no vomiting.  No diarrhea.  No constipation. Genitourinary: Negative for dysuria. Musculoskeletal: Negative for back pain. Skin: As above Neurological: Negative for headaches, focal weakness or numbness.   ____________________________________________   PHYSICAL EXAM:  VITAL SIGNS: ED Triage Vitals  Enc Vitals Group     BP 11/20/18 2043 (!) 162/97     Pulse Rate 11/20/18 2043 (!) 142     Resp 11/20/18 2043 (!) 23     Temp 11/20/18 2049 98.3 F (36.8 C)     Temp Source 11/20/18 2049 Oral     SpO2 11/20/18 2043 94 %     Weight 11/20/18 2040 180 lb (81.6 kg)     Height 11/20/18 2040 6' (1.829 m)     Head Circumference --      Peak Flow --      Pain Score --      Pain Loc --      Pain Edu? --      Excl. in [place]? --     Constitutional: Alert and oriented but with mildly slurred speech and droopy eyes.  Appears intoxicated but awake and alert with a normal  respiratory rate. Eyes: Conjunctivae are normal.  Head: Atraumatic. Nose: No congestion/rhinnorhea. Mouth/Throat: Mucous membranes are moist.  Neck: No stridor.   Cardiovascular: Tachycardic, regular rhythm. Grossly normal heart sounds. Respiratory: Normal respiratory effort.  No retractions. Lungs CTAB. Gastrointestinal: Soft and nontender. No distention.  Musculoskeletal: No lower extremity tenderness nor edema.  No joint effusions. Neurologic:   No gross focal neurologic deficits are appreciated. Skin: Right forearm with a 2 cm area of granulation tissue which is pink and scabbed over with a pit in the middle of it.  No pus is able to be expressed but there  is a wheal of erythema proximally 1 cm around this area.     ____________________________________________   LABS (all labs ordered are listed, but only abnormal results are displayed)  Labs Reviewed  CBC WITH DIFFERENTIAL/PLATELET - Abnormal; Notable for the following components:      Result Value   WBC 18.1 (*)    Hemoglobin 12.2 (*)    MCH 25.3 (*)    RDW 16.4 (*)    Neutro Abs 8.4 (*)    Lymphs Abs 8.2 (*)    Abs Immature Granulocytes 0.10 (*)    All other components within normal limits  COMPREHENSIVE METABOLIC PANEL - Abnormal; Notable for the following components:   Potassium 3.1 (*)    Glucose, Bld 195 (*)    Calcium 8.8 (*)    All other components within normal limits  ACETAMINOPHEN LEVEL - Abnormal; Notable for the following components:   Acetaminophen (Tylenol), Serum <10 (*)    All other components within normal limits  SALICYLATE LEVEL  ETHANOL  URINE DRUG SCREEN, QUALITATIVE ([facility] ONLY)  PATHOLOGIST SMEAR REVIEW   ____________________________________________  EKG  ED ECG REPORT I, Arelia Longest, the attending physician, personally viewed and interpreted this ECG.   Date: 11/20/2018  EKG Time: 2041  Rate: 139  Rhythm: sinus tachycardia  Axis: Normal  Intervals:none  ST&T Change: Minimal ST segment elevation in V3 with a concave morphology.  No reciprocal depression.  No abnormal T wave inversions.  ____________________________________________  RADIOLOGY   ____________________________________________   PROCEDURES  Procedure(s) performed:   Procedures  Critical Care performed:   ____________________________________________   INITIAL IMPRESSION / ASSESSMENT AND PLAN / ED COURSE  Pertinent labs & imaging results that were available during my care of the patient were reviewed by me and considered in my medical decision making (see chart for details).  DDX: Polysubstance abuse, heroin overdose, abscess, cellulitis, alcohol  intoxication As part of my medical decision making, I reviewed the following data within the electronic MEDICAL RECORD NUMBER Notes from prior ED visits  ----------------------------------------- 11:22 PM on 11/20/2018 -----------------------------------------  Patient with heart rate of 92 at this time.  Awake and alert.  No longer groggy.  Not slurring his words.  Never received Narcan.  Reassuring blood work.  Patient has needed to be discharged at this time which I believe is appropriate.  He refused the resources from the [facility]TS.  We will give follow-up with [facility] as well as [facility] on discharge instructions.  Is understand the diagnosis well treatment and willing to comply.  Abscess is open and draining.  Does not need to be incised.  However, will give antibiotics. ____________________________________________   FINAL CLINICAL IMPRESSION(S) / ED DIAGNOSES  Heroin overdose.  Abscess.  Cellulitis.  NEW MEDICATIONS STARTED DURING THIS VISIT:  New Prescriptions   No medications on file     Note:  This document was prepared using Dragon  voice recognition software and may include unintentional dictation errors.     Myrna BlazerSchaevitz, Hughey Rittenberry Matthew, MD 11/20/18 [phone]

## 2018-11-21 LAB — PATHOLOGIST SMEAR REVIEW

## 2019-12-06 ENCOUNTER — Other Ambulatory Visit: Payer: Self-pay

## 2019-12-06 ENCOUNTER — Emergency Department
Admission: EM | Admit: 2019-12-06 | Discharge: 2019-12-06 | Disposition: A | Discharge status: Home / Self Care | Payer: Self-pay | Attending: Emergency Medicine | Admitting: Emergency Medicine

## 2019-12-06 ENCOUNTER — Emergency Department: Payer: Self-pay

## 2019-12-06 DIAGNOSIS — T401X1A Poisoning by heroin, accidental (unintentional), initial encounter: Secondary | ICD-10-CM | POA: Insufficient documentation

## 2019-12-06 DIAGNOSIS — T402X4A Poisoning by other opioids, undetermined, initial encounter: Secondary | ICD-10-CM

## 2019-12-06 DIAGNOSIS — E860 Dehydration: Secondary | ICD-10-CM | POA: Insufficient documentation

## 2019-12-06 DIAGNOSIS — Z20822 Contact with and (suspected) exposure to covid-19: Secondary | ICD-10-CM | POA: Insufficient documentation

## 2019-12-06 DIAGNOSIS — M25511 Pain in right shoulder: Secondary | ICD-10-CM | POA: Insufficient documentation

## 2019-12-06 DIAGNOSIS — F17200 Nicotine dependence, unspecified, uncomplicated: Secondary | ICD-10-CM | POA: Insufficient documentation

## 2019-12-06 DIAGNOSIS — R519 Headache, unspecified: Secondary | ICD-10-CM | POA: Insufficient documentation

## 2019-12-06 DIAGNOSIS — J40 Bronchitis, not specified as acute or chronic: Secondary | ICD-10-CM | POA: Insufficient documentation

## 2019-12-06 LAB — CBC WITH DIFFERENTIAL/PLATELET
Abs Immature Granulocytes: 0.05 10*3/uL (ref 0.00–0.07)
Basophils Absolute: 0 10*3/uL (ref 0.0–0.1)
Basophils Relative: 0 %
Eosinophils Absolute: 0.2 10*3/uL (ref 0.0–0.5)
Eosinophils Relative: 2 %
HCT: 35.9 % — ABNORMAL LOW (ref 39.0–52.0)
Hemoglobin: 11.7 g/dL — ABNORMAL LOW (ref 13.0–17.0)
Immature Granulocytes: 1 %
Lymphocytes Relative: 12 %
Lymphs Abs: 1.2 10*3/uL (ref 0.7–4.0)
MCH: 26.4 pg (ref 26.0–34.0)
MCHC: 32.6 g/dL (ref 30.0–36.0)
MCV: 81 fL (ref 80.0–100.0)
Monocytes Absolute: 0.7 10*3/uL (ref 0.1–1.0)
Monocytes Relative: 7 %
Neutro Abs: 7.7 10*3/uL (ref 1.7–7.7)
Neutrophils Relative %: 78 %
Platelets: 252 10*3/uL (ref 150–400)
RBC: 4.43 MIL/uL (ref 4.22–5.81)
RDW: 14.6 % (ref 11.5–15.5)
WBC: 9.8 10*3/uL (ref 4.0–10.5)
nRBC: 0 % (ref 0.0–0.2)

## 2019-12-06 LAB — COMPREHENSIVE METABOLIC PANEL
ALT: 115 U/L — ABNORMAL HIGH (ref 0–44)
AST: 60 U/L — ABNORMAL HIGH (ref 15–41)
Albumin: 3.9 g/dL (ref 3.5–5.0)
Alkaline Phosphatase: 97 U/L (ref 38–126)
Anion gap: 7 (ref 5–15)
BUN: 24 mg/dL — ABNORMAL HIGH (ref 6–20)
CO2: 26 mmol/L (ref 22–32)
Calcium: 8.9 mg/dL (ref 8.9–10.3)
Chloride: 106 mmol/L (ref 98–111)
Creatinine, Ser: 1.37 mg/dL — ABNORMAL HIGH (ref 0.61–1.24)
GFR calc Af Amer: >60 mL/min (ref >60)
GFR calc non Af Amer: >60 mL/min (ref >60)
Glucose, Bld: 131 mg/dL — ABNORMAL HIGH (ref 70–99)
Potassium: 4.2 mmol/L (ref 3.5–5.1)
Sodium: 139 mmol/L (ref 135–145)
Total Bilirubin: 0.7 mg/dL (ref 0.3–1.2)
Total Protein: 8.1 g/dL (ref 6.5–8.1)

## 2019-12-06 LAB — RESPIRATORY PANEL BY RT PCR (FLU A&B, COVID)
Influenza A by PCR: NEGATIVE
Influenza B by PCR: NEGATIVE
SARS Coronavirus 2 by RT PCR: NEGATIVE

## 2019-12-06 LAB — TROPONIN I (HIGH SENSITIVITY): Troponin I (High Sensitivity): 5 ng/L (ref <18)

## 2019-12-06 LAB — ETHANOL: Alcohol, Ethyl (B): <10 mg/dL (ref <10)

## 2019-12-06 MED ORDER — NALOXONE HCL 2 MG/2ML IJ SOSY
2.0000 mg | PREFILLED_SYRINGE | Freq: Once | INTRAMUSCULAR | Status: AC
  Administered 2019-12-06: 2 mg via INTRAVENOUS
  Filled 2019-12-06: qty 2

## 2019-12-06 MED ORDER — AZITHROMYCIN 250 MG PO TABS: ORAL_TABLET | ORAL | 0 refills | Status: DC

## 2019-12-06 MED ORDER — SODIUM CHLORIDE 0.9 % IV SOLN: Freq: Once | INTRAVENOUS | Status: AC

## 2019-12-06 NOTE — ED Notes (Signed)
Dr. Mayford Knife in to see patient. Patient continues to refuse treatment. Dr. Mayford Knife explained to the patient that he would be discharged at this time.

## 2019-12-06 NOTE — ED Triage Notes (Signed)
Per EMS pt was found on kitchen floor by wife who said pt was not breathing. Pt was alert and breathing upon EMS arrival with pinpoint pupils noted. Pt reports heroin use but says he did not use heroin today and has childhood hx of syncopal episodes. Pt c/o right shoulder pain and mild headache.  Pt aox4, nad noted. Pinpoint pupils noted bilaterally.

## 2019-12-06 NOTE — ED Notes (Signed)
Attempted IV X2 without success due to scar tissue. Will have alternate RN attempt.

## 2019-12-06 NOTE — ED Provider Notes (Signed)
[facility] Emergency Department Provider Note       Time seen: ----------------------------------------- 10:00 PM on 12/06/2019 -----------------------------------------   I have reviewed the triage vital signs and the nursing notes.  HISTORY   Chief Complaint Loss of Consciousness    HPI Michael Lester is a 34 y.o. male with a history of heroin use who presents to the ED for unresponsiveness.  Patient reportedly was brought in by EMS after being found on the kitchen floor by his wife is that he was not breathing.  Patient reports heroin use but states he does not use heroin today and has a history in his childhood of syncopal episodes.  He complains of right shoulder pain and headache.  Discomfort is 6 out of 10.  History reviewed. No pertinent past medical history.  Patient Active Problem List   Diagnosis Date Noted  . Multiple lacerations 11/17/2014    Past Surgical History:  Procedure Laterality Date  . LACERATION REPAIR N/A 11/16/2014   Procedure: REPAIR MULTIPLE LACERATIONS;  Surgeon: Ruby Cola, MD;  Location: [place];  Service: ENT;  Laterality: N/A;  . LACERATION REPAIR Left 11/16/2014   Procedure: REPAIR MULTIPLE LACERATIONS;  Surgeon: Donnie Mesa, MD;  Location: [place];  Service: General;  Laterality: Left;  irrigation and suturing of lacerations on upper body    Allergies Shellfish allergy  Social History Social History   Tobacco Use  . Smoking status: Current Some Day Smoker  . Smokeless tobacco: Never Used  Substance Use Topics  . Alcohol use: Yes  . Drug use: Yes    Types: Marijuana    Review of Systems Constitutional: Negative for fever. Cardiovascular: Negative for chest pain.  Positive for syncope Respiratory: Negative for shortness of breath. Gastrointestinal: Negative for abdominal pain, vomiting and diarrhea. Musculoskeletal: positive for right shoulder pain Skin: Negative for rash. Neurological: Positive for  headache  All systems negative/normal/unremarkable except as stated in the HPI  ____________________________________________   PHYSICAL EXAM:  VITAL SIGNS: ED Triage Vitals  Enc Vitals Group     BP 12/06/19 2138 135/80     Pulse Rate 12/06/19 2139 (!) 119     Resp 12/06/19 2143 11     Temp 12/06/19 2143 98.9 F (37.2 C)     Temp src --      SpO2 12/06/19 2138 93 %     Weight 12/06/19 2144 150 lb (68 kg)     Height 12/06/19 2144 6\' 1"  (1.854 m)     Head Circumference --      Peak Flow --      Pain Score 12/06/19 2143 6     Pain Loc --      Pain Edu? --      Excl. in [place]? --     Constitutional: Alert and oriented. Well appearing and in no distress. Eyes: Conjunctivae are normal. Normal extraocular movements. ENT      Head: Normocephalic and atraumatic.      Nose: No congestion/rhinnorhea.      Mouth/Throat: Mucous membranes are moist.      Neck: No stridor. Cardiovascular: Normal rate, regular rhythm. No murmurs, rubs, or gallops. Respiratory: Normal respiratory effort without tachypnea nor retractions. Breath sounds are clear and equal bilaterally. No wheezes/rales/rhonchi. Gastrointestinal: Soft and nontender. Normal bowel sounds Musculoskeletal: Nontender with normal range of motion in extremities. No lower extremity tenderness nor edema. Neurologic:  Normal speech and language. No gross focal neurologic deficits are appreciated.  Skin:  Skin is warm,  dry and intact. No rash noted. Psychiatric: Mood and affect are normal. Speech and behavior are normal.  ____________________________________________  EKG: Interpreted by me.  Sinus tachycardia with a rate of 106 bpm, normal PR interval, normal QRS, normal QT  ____________________________________________  ED COURSE:  As part of my medical decision making, I reviewed the following data within the electronic MEDICAL RECORD NUMBER History obtained from family if available, nursing notes, old chart and ekg, as well as notes  from prior ED visits. Patient presented for an unresponsive episode, we will assess with labs and imaging as indicated at this time.   Procedures  Michael Lester was evaluated in Emergency Department on 12/06/2019 for the symptoms described in the history of present illness. He was evaluated in the context of the global COVID-19 pandemic, which necessitated consideration that the patient might be at risk for infection with the SARS-CoV-2 virus that causes COVID-19. Institutional protocols and algorithms that pertain to the evaluation of patients at risk for COVID-19 are in a state of rapid change based on information released by regulatory bodies including the CDC and federal and state organizations. These policies and algorithms were followed during the patient's care in the ED.  ____________________________________________   LABS (pertinent positives/negatives)  Labs Reviewed  COMPREHENSIVE METABOLIC PANEL - Abnormal; Notable for the following components:      Result Value   Glucose, Bld 131 (*)    BUN 24 (*)    Creatinine, Ser 1.37 (*)    AST 60 (*)    ALT 115 (*)    All other components within normal limits  CBC WITH DIFFERENTIAL/PLATELET - Abnormal; Notable for the following components:   Hemoglobin 11.7 (*)    HCT 35.9 (*)    All other components within normal limits  RESPIRATORY PANEL BY RT PCR (FLU A&B, COVID)  CBC WITH DIFFERENTIAL/PLATELET  URINALYSIS, COMPLETE (UACMP) WITH MICROSCOPIC  URINE DRUG SCREEN, QUALITATIVE ([facility] ONLY)  ETHANOL  TROPONIN I (HIGH SENSITIVITY)    RADIOLOGY Images were viewed by me  Chest x-ray IMPRESSION:  Mildly increased bilateral perihilar opacities which could be due to  atelectasis and/or early infectious etiology.  ____________________________________________   DIFFERENTIAL DIAGNOSIS   Overdose, intoxication, arrhythmia, MI, dehydration, electrolyte abnormality  FINAL ASSESSMENT AND PLAN  Opioid overdose, bronchitis,  dehydration   Plan: The patient had presented for likely accidental overdose of narcotics.  Patient was given Narcan which abruptly improved his pinpoint pupils and altered mental status as well as his intoxicated appearance patient's labs did indicate some dehydration. Patient's imaging resembled bronchitis. He is cleared for outpatient follow up.    Ulice Dash, MD    Note: This note was generated in part or whole with voice recognition software. Voice recognition is usually quite accurate but there are transcription errors that can and very often do occur. I apologize for any typographical errors that were not detected and corrected.     Emily Filbert, MD 12/06/19 2300

## 2019-12-06 NOTE — ED Notes (Signed)
Patient calling out this nurse in to see patient. Patient had pulled IV out and removed cardiac monitor, blood pressure cuff and pulse ox. Patient states that we gave him that shit that he does not want. This nurse explained that we were giving him NS. Patient states that we had given him Narcan. This RN asked patient if he would let me restart his IV he said no that it was making him cold. This nurse asked if he would let me connect him to back to the monitor. Patient refused to be connected to the monitor at this time. Dr. Mayford Knife notified.

## 2019-12-19 ENCOUNTER — Encounter: Payer: Self-pay | Admitting: Emergency Medicine

## 2019-12-19 ENCOUNTER — Emergency Department: Payer: Self-pay

## 2019-12-19 ENCOUNTER — Emergency Department
Admission: EM | Admit: 2019-12-19 | Discharge: 2019-12-19 | Disposition: A | Discharge status: Other Acute Inpatient Hospital | Payer: Self-pay | Attending: Emergency Medicine | Admitting: Emergency Medicine

## 2019-12-19 ENCOUNTER — Inpatient Hospital Stay (HOSPITAL_COMMUNITY)
Admission: AD | Admit: 2019-12-19 | Discharge: 2020-02-02 | DRG: 853 | Disposition: A | Discharge status: Home / Self Care | Payer: Self-pay | Source: Other Acute Inpatient Hospital | Attending: Internal Medicine | Admitting: Internal Medicine

## 2019-12-19 ENCOUNTER — Other Ambulatory Visit: Payer: Self-pay

## 2019-12-19 DIAGNOSIS — G061 Intraspinal abscess and granuloma: Secondary | ICD-10-CM | POA: Diagnosis present

## 2019-12-19 DIAGNOSIS — Z79899 Other long term (current) drug therapy: Secondary | ICD-10-CM

## 2019-12-19 DIAGNOSIS — M5080 Other cervical disc disorders, unspecified cervical region: Secondary | ICD-10-CM

## 2019-12-19 DIAGNOSIS — A411 Sepsis due to other specified staphylococcus: Principal | ICD-10-CM | POA: Diagnosis present

## 2019-12-19 DIAGNOSIS — M4642 Discitis, unspecified, cervical region: Secondary | ICD-10-CM | POA: Insufficient documentation

## 2019-12-19 DIAGNOSIS — Z20822 Contact with and (suspected) exposure to covid-19: Secondary | ICD-10-CM | POA: Diagnosis present

## 2019-12-19 DIAGNOSIS — M4622 Osteomyelitis of vertebra, cervical region: Secondary | ICD-10-CM | POA: Insufficient documentation

## 2019-12-19 DIAGNOSIS — A419 Sepsis, unspecified organism: Secondary | ICD-10-CM | POA: Diagnosis present

## 2019-12-19 DIAGNOSIS — Z419 Encounter for procedure for purposes other than remedying health state, unspecified: Secondary | ICD-10-CM

## 2019-12-19 DIAGNOSIS — B9689 Other specified bacterial agents as the cause of diseases classified elsewhere: Secondary | ICD-10-CM

## 2019-12-19 DIAGNOSIS — F1990 Other psychoactive substance use, unspecified, uncomplicated: Secondary | ICD-10-CM | POA: Diagnosis present

## 2019-12-19 DIAGNOSIS — D638 Anemia in other chronic diseases classified elsewhere: Secondary | ICD-10-CM | POA: Diagnosis present

## 2019-12-19 DIAGNOSIS — K5900 Constipation, unspecified: Secondary | ICD-10-CM | POA: Diagnosis present

## 2019-12-19 DIAGNOSIS — M4802 Spinal stenosis, cervical region: Secondary | ICD-10-CM | POA: Diagnosis present

## 2019-12-19 DIAGNOSIS — R55 Syncope and collapse: Secondary | ICD-10-CM

## 2019-12-19 DIAGNOSIS — R748 Abnormal levels of other serum enzymes: Secondary | ICD-10-CM

## 2019-12-19 DIAGNOSIS — R200 Anesthesia of skin: Secondary | ICD-10-CM | POA: Insufficient documentation

## 2019-12-19 DIAGNOSIS — R768 Other specified abnormal immunological findings in serum: Secondary | ICD-10-CM

## 2019-12-19 DIAGNOSIS — B356 Tinea cruris: Secondary | ICD-10-CM | POA: Diagnosis not present

## 2019-12-19 DIAGNOSIS — D696 Thrombocytopenia, unspecified: Secondary | ICD-10-CM | POA: Diagnosis not present

## 2019-12-19 DIAGNOSIS — Z720 Tobacco use: Secondary | ICD-10-CM | POA: Insufficient documentation

## 2019-12-19 DIAGNOSIS — M479 Spondylosis, unspecified: Secondary | ICD-10-CM | POA: Diagnosis present

## 2019-12-19 DIAGNOSIS — F1120 Opioid dependence, uncomplicated: Secondary | ICD-10-CM | POA: Diagnosis present

## 2019-12-19 DIAGNOSIS — B182 Chronic viral hepatitis C: Secondary | ICD-10-CM | POA: Diagnosis present

## 2019-12-19 DIAGNOSIS — R6520 Severe sepsis without septic shock: Secondary | ICD-10-CM | POA: Diagnosis present

## 2019-12-19 DIAGNOSIS — Z91013 Allergy to seafood: Secondary | ICD-10-CM

## 2019-12-19 DIAGNOSIS — F17210 Nicotine dependence, cigarettes, uncomplicated: Secondary | ICD-10-CM | POA: Diagnosis present

## 2019-12-19 DIAGNOSIS — F1910 Other psychoactive substance abuse, uncomplicated: Secondary | ICD-10-CM

## 2019-12-19 LAB — COMPREHENSIVE METABOLIC PANEL
ALT: 15 U/L (ref 0–44)
AST: 17 U/L (ref 15–41)
Albumin: 3.6 g/dL (ref 3.5–5.0)
Alkaline Phosphatase: 112 U/L (ref 38–126)
Anion gap: 9 (ref 5–15)
BUN: 23 mg/dL — ABNORMAL HIGH (ref 6–20)
CO2: 24 mmol/L (ref 22–32)
Calcium: 9 mg/dL (ref 8.9–10.3)
Chloride: 105 mmol/L (ref 98–111)
Creatinine, Ser: 1.19 mg/dL (ref 0.61–1.24)
GFR calc Af Amer: >60 mL/min (ref >60)
GFR calc non Af Amer: >60 mL/min (ref >60)
Glucose, Bld: 195 mg/dL — ABNORMAL HIGH (ref 70–99)
Potassium: 4.7 mmol/L (ref 3.5–5.1)
Sodium: 138 mmol/L (ref 135–145)
Total Bilirubin: 0.2 mg/dL — ABNORMAL LOW (ref 0.3–1.2)
Total Protein: 8.4 g/dL — ABNORMAL HIGH (ref 6.5–8.1)

## 2019-12-19 LAB — CBC WITH DIFFERENTIAL/PLATELET
Abs Immature Granulocytes: 0.09 10*3/uL — ABNORMAL HIGH (ref 0.00–0.07)
Basophils Absolute: 0.1 10*3/uL (ref 0.0–0.1)
Basophils Relative: 0 %
Eosinophils Absolute: 0 10*3/uL (ref 0.0–0.5)
Eosinophils Relative: 0 %
HCT: 35.1 % — ABNORMAL LOW (ref 39.0–52.0)
Hemoglobin: 11.3 g/dL — ABNORMAL LOW (ref 13.0–17.0)
Immature Granulocytes: 1 %
Lymphocytes Relative: 6 %
Lymphs Abs: 0.8 10*3/uL (ref 0.7–4.0)
MCH: 26.3 pg (ref 26.0–34.0)
MCHC: 32.2 g/dL (ref 30.0–36.0)
MCV: 81.6 fL (ref 80.0–100.0)
Monocytes Absolute: 0.9 10*3/uL (ref 0.1–1.0)
Monocytes Relative: 6 %
Neutro Abs: 12.6 10*3/uL — ABNORMAL HIGH (ref 1.7–7.7)
Neutrophils Relative %: 87 %
Platelets: 325 10*3/uL (ref 150–400)
RBC: 4.3 MIL/uL (ref 4.22–5.81)
RDW: 13.6 % (ref 11.5–15.5)
WBC: 14.4 10*3/uL — ABNORMAL HIGH (ref 4.0–10.5)
nRBC: 0 % (ref 0.0–0.2)

## 2019-12-19 LAB — PROTIME-INR
INR: 1.2 (ref 0.8–1.2)
Prothrombin Time: 14.8 seconds (ref 11.4–15.2)

## 2019-12-19 LAB — ETHANOL: Alcohol, Ethyl (B): <10 mg/dL (ref <10)

## 2019-12-19 LAB — ACETAMINOPHEN LEVEL: Acetaminophen (Tylenol), Serum: <10 ug/mL — ABNORMAL LOW (ref 10–30)

## 2019-12-19 LAB — CK: Total CK: 86 U/L (ref 49–397)

## 2019-12-19 LAB — SALICYLATE LEVEL: Salicylate Lvl: <7 mg/dL — ABNORMAL LOW (ref 7.0–30.0)

## 2019-12-19 LAB — RESPIRATORY PANEL BY RT PCR (FLU A&B, COVID)
Influenza A by PCR: NEGATIVE
Influenza B by PCR: NEGATIVE
SARS Coronavirus 2 by RT PCR: NEGATIVE

## 2019-12-19 LAB — LACTIC ACID, PLASMA: Lactic Acid, Venous: 1.2 mmol/L (ref 0.5–1.9)

## 2019-12-19 LAB — APTT: aPTT: 33 seconds (ref 24–36)

## 2019-12-19 MED ORDER — SODIUM CHLORIDE 0.9 % IV SOLN
2.0000 g | Freq: Once | INTRAVENOUS | Status: AC
  Administered 2019-12-19: 18:00:00 2 g via INTRAVENOUS
  Filled 2019-12-19: qty 2

## 2019-12-19 MED ORDER — LACTATED RINGERS IV BOLUS
1000.0000 mL | Freq: Once | INTRAVENOUS | Status: AC
  Administered 2019-12-19: 1000 mL via INTRAVENOUS

## 2019-12-19 MED ORDER — ACETAMINOPHEN 500 MG PO TABS
1000.0000 mg | ORAL_TABLET | Freq: Once | ORAL | Status: AC
  Administered 2019-12-19: 1000 mg via ORAL
  Filled 2019-12-19: qty 2

## 2019-12-19 MED ORDER — VANCOMYCIN HCL IN DEXTROSE 1-5 GM/200ML-% IV SOLN
1000.0000 mg | Freq: Once | INTRAVENOUS | Status: AC
  Administered 2019-12-19: 20:00:00 1000 mg via INTRAVENOUS
  Filled 2019-12-19: qty 200

## 2019-12-19 MED ORDER — METRONIDAZOLE IN NACL 5-0.79 MG/ML-% IV SOLN
500.0000 mg | Freq: Once | INTRAVENOUS | Status: AC
  Administered 2019-12-19: 500 mg via INTRAVENOUS
  Filled 2019-12-19: qty 100

## 2019-12-19 MED ORDER — MORPHINE SULFATE (PF) 4 MG/ML IV SOLN
4.0000 mg | Freq: Once | INTRAVENOUS | Status: AC
  Administered 2019-12-19: 4 mg via INTRAVENOUS
  Filled 2019-12-19: qty 1

## 2019-12-19 MED ORDER — GADOBUTROL 1 MMOL/ML IV SOLN
7.0000 mL | Freq: Once | INTRAVENOUS | Status: AC | PRN
  Administered 2019-12-19: 19:00:00 7 mL via INTRAVENOUS

## 2019-12-19 MED ORDER — LACTATED RINGERS IV BOLUS
1000.0000 mL | Freq: Once | INTRAVENOUS | Status: AC
  Administered 2019-12-19: 19:00:00 1000 mL via INTRAVENOUS

## 2019-12-19 NOTE — ED Notes (Signed)
Patient's mouth was swabbed with lemon swabs with Dr. Diona Foley approval. Patient has dried blood on teeth and poor dental hygiene.

## 2019-12-19 NOTE — ED Notes (Signed)
Report given to [facility]. 

## 2019-12-19 NOTE — ED Notes (Signed)
Patient gave verbal permission to give report to father via telephone.

## 2019-12-19 NOTE — ED Notes (Signed)
C-collar removed by Dr. Larinda Buttery.

## 2019-12-19 NOTE — ED Notes (Signed)
[facility] at bedside 

## 2019-12-19 NOTE — ED Notes (Addendum)
Patient c/o numbness to right 2nd and 3rd fingers. Dr. Larinda Buttery aware. Patient voided while at MRI. Patient denies need to void at this time.

## 2019-12-19 NOTE — ED Notes (Signed)
[facility] left with patient 

## 2019-12-19 NOTE — ED Notes (Signed)
C-collar in place

## 2019-12-19 NOTE — ED Notes (Signed)
Patient at CT scan.

## 2019-12-19 NOTE — Consult Note (Signed)
PHARMACY -  BRIEF ANTIBIOTIC NOTE   Pharmacy has received consult(s) for vancomycin and cefepime from an ED provider. Patient also ordered metronidazole x 1. The patient's profile has been reviewed for ht/wt/allergies/indication/available labs.    One time order(s) placed for -Vancomycin 1 g (already ordered) -Cefepime 2 g (already ordered)  Further antibiotics/pharmacy consults should be ordered by admitting physician if indicated.                       Thank you, Tressie Ellis  Pharmacy Resident 12/19/2019  5:00 PM

## 2019-12-19 NOTE — ED Notes (Signed)
Patient is fully alert and oriented. Patient was repositioned by raising head of bed. Patient was given oral swabs for mouth care. Patient is watching TV at this time.

## 2019-12-19 NOTE — Progress Notes (Signed)
CODE SEPSIS - PHARMACY COMMUNICATION  **Broad Spectrum Antibiotics should be administered within 1 hour of Sepsis diagnosis**  Time Code Sepsis Called/Page Received: 1657  Antibiotics Ordered: Cefepime, metronidazole, vancomycin  Time of 1st antibiotic administration: 1746  Additional action taken by pharmacy: n/a   Tressie Ellis  Pharmacy Resident 12/19/2019  5:02 PM

## 2019-12-19 NOTE — ED Provider Notes (Signed)
[facility] Emergency Department Provider Note   ____________________________________________   First MD Initiated Contact with Patient 12/19/19 1501     (approximate)  I have reviewed the triage vital signs and the nursing notes.   HISTORY  Chief Complaint Altered Mental Status    HPI Michael Lester is a 34 y.o. male with possible history of IV drug abuse who presents to the ED for altered mental status.  History is limited due to patient's confusion.  Per EMS, patient's girlfriend had stated he went out to the barn to "check on something".  A couple of hours later, she found him on the ground in the barn unresponsive.  EMS found the patient to be responsive but lethargic, groaning and seeming to be in pain.  Patient is able to answer some questions and primarily complains of pain along the back of his head and neck.  He states the pain is sharp and severe, shooting down the back of his head into his neck and right shoulder.  He admits to using heroin earlier today, but does not remember if he fell or hit his head.  He denies any drug use beyond heroin and denies any alcohol abuse.        History reviewed. No pertinent past medical history.  Patient Active Problem List   Diagnosis Date Noted  . Multiple lacerations 11/17/2014    Past Surgical History:  Procedure Laterality Date  . LACERATION REPAIR N/A 11/16/2014   Procedure: REPAIR MULTIPLE LACERATIONS;  Surgeon: Ruby Cola, MD;  Location: [place];  Service: ENT;  Laterality: N/A;  . LACERATION REPAIR Left 11/16/2014   Procedure: REPAIR MULTIPLE LACERATIONS;  Surgeon: Donnie Mesa, MD;  Location: [place];  Service: General;  Laterality: Left;  irrigation and suturing of lacerations on upper body    Prior to Admission medications   Medication Sig Start Date End Date Taking? Authorizing Provider  azithromycin (ZITHROMAX Z-PAK) 250 MG tablet Dispense 1 Z-Pak as directed 12/06/19   Earleen Newport, MD   clotrimazole (LOTRIMIN) 1 % external solution Apply 1 application topically 2 (two) times daily. 01/13/17   Merlyn Lot, MD  oxyCODONE-acetaminophen (ROXICET) 5-325 MG per tablet Take 1-2 tablets by mouth every 4 (four) hours as needed (Pain). 11/17/14   Lisette Abu, PA-C  sulfamethoxazole-trimethoprim (BACTRIM DS,SEPTRA DS) 800-160 MG tablet Take 1 tablet by mouth 2 (two) times daily. 11/20/18   Schaevitz, Randall An, MD    Allergies Shellfish allergy  History reviewed. No pertinent family history.  Social History Social History   Tobacco Use  . Smoking status: Current Some Day Smoker  . Smokeless tobacco: Never Used  Substance Use Topics  . Alcohol use: Yes  . Drug use: Yes    Types: Marijuana    Review of Systems Unable to obtain secondary to altered mental status  ____________________________________________   PHYSICAL EXAM:  VITAL SIGNS: ED Triage Vitals  Enc Vitals Group     BP 12/19/19 1507 (!) 130/96     Pulse Rate 12/19/19 1502 (!) 165     Resp 12/19/19 1502 (!) 26     Temp 12/19/19 1507 98.7 F (37.1 C)     Temp Source 12/19/19 1507 Oral     SpO2 12/19/19 1502 100 %     Weight --      Height --      Head Circumference --      Peak Flow --      Pain Score --  Pain Loc --      Pain Edu? --      Excl. in GC? --     Constitutional: Somnolent but arousable, groaning in pain. Eyes: Conjunctivae are normal.  Pupils 2 mm and minimally reactive bilaterally. Head: Atraumatic. Nose: No congestion/rhinnorhea. Mouth/Throat: Mucous membranes are very dry. Neck: Midline cervical spine tenderness to palpation, range of motion limited secondary to pain. Cardiovascular: Tachycardic, regular rhythm. Grossly normal heart sounds. Respiratory: Normal respiratory effort.  No retractions. Lungs CTAB. Gastrointestinal: Soft and nontender. No distention. Genitourinary: deferred Musculoskeletal: No lower extremity tenderness nor edema. Neurologic: Slurred  and irregular speech pattern.  Difficulty with following commands, appears to have diminished strength in his right upper extremity compared to all other extremities. Skin:  Skin is warm, dry and intact. No rash noted. Psychiatric: Mood and affect are normal.  Slowed speech.  ____________________________________________   LABS (all labs ordered are listed, but only abnormal results are displayed)  Labs Reviewed  COMPREHENSIVE METABOLIC PANEL - Abnormal; Notable for the following components:      Result Value   Glucose, Bld 195 (*)    BUN 23 (*)    Total Protein 8.4 (*)    Total Bilirubin 0.2 (*)    All other components within normal limits  CBC WITH DIFFERENTIAL/PLATELET - Abnormal; Notable for the following components:   WBC 14.4 (*)    Hemoglobin 11.3 (*)    HCT 35.1 (*)    Neutro Abs 12.6 (*)    Abs Immature Granulocytes 0.09 (*)    All other components within normal limits  ACETAMINOPHEN LEVEL - Abnormal; Notable for the following components:   Acetaminophen (Tylenol), Serum <10 (*)    All other components within normal limits  SALICYLATE LEVEL - Abnormal; Notable for the following components:   Salicylate Lvl <7.0 (*)    All other components within normal limits  RESPIRATORY PANEL BY RT PCR (FLU A&B, COVID)  CULTURE, BLOOD (ROUTINE X 2)  CULTURE, BLOOD (ROUTINE X 2)  LACTIC ACID, PLASMA  ETHANOL  CK  APTT  PROTIME-INR  URINALYSIS, COMPLETE (UACMP) WITH MICROSCOPIC  URINE DRUG SCREEN, QUALITATIVE ([facility] ONLY)   ____________________________________________  EKG  ED ECG REPORT I, Chesley Noon, the attending physician, personally viewed and interpreted this ECG.   Date: 12/19/2019  EKG Time: 15:27  Rate: 144  Rhythm: sinus tachycardia  Axis: Normal  Intervals:none  ST&T Change: None   PROCEDURES  Procedure(s) performed (including Critical Care):  .Critical Care Performed by: Chesley Noon, MD Authorized by: Chesley Noon, MD   Critical care  provider statement:    Critical care time (minutes):  45   Critical care time was exclusive of:  Separately billable procedures and treating other patients and teaching time   Critical care was necessary to treat or prevent imminent or life-threatening deterioration of the following conditions:  Sepsis and CNS failure or compromise   Critical care was time spent personally by me on the following activities:  Discussions with consultants, evaluation of patient's response to treatment, examination of patient, ordering and performing treatments and interventions, ordering and review of laboratory studies, ordering and review of radiographic studies, pulse oximetry, re-evaluation of patient's condition, obtaining history from patient or surrogate and review of old charts   I assumed direction of critical care for this patient from another provider in my specialty: no       ____________________________________________   INITIAL IMPRESSION / ASSESSMENT AND PLAN / ED COURSE       34 year old  male with history of IV drug abuse presents to the ED after being found down in a barn by his significant other, admits to heroin use earlier today.  He is confused and lethargic here in the ED with significant sinus tachycardia and tachypnea, but afebrile.  He primarily complains of pain to the back of his head and neck and due to concern for trauma, was subsequently placed in a c-collar.  We will initially assess with CT head and C-spine, but if these are negative, we will also need to consider noninfectious etiology given his IV drug abuse.  Drug intoxication also on the differential but his clinical picture currently does not appear consistent with heroin overdose.  ----------------------------------------- 5:01 PM on 12/19/2019 -----------------------------------------  CT scan concerning for discitis and osteomyelitis, will start on broad-spectrum antibiotics and perform MRI with and without contrast to  further assess.  Will reach out to discuss with neurosurgery at [facility] given no neurosurgery available here at [facility] today.  ----------------------------------------- 7:25 PM on 12/19/2019 -----------------------------------------  Case discussed with PA Doran Durand of [facility] neurosurgery, who states that we will need to determine need for transfer based off of MRI results.  If patient has abscess or other process requiring surgical intervention, then patient may be transferred, but otherwise may be admitted here at [facility].  MRI of cervical spine confirms discitis and osteomyelitis with developing phlegmon but no abscess.  Case was again discussed with neurosurgery at [facility], who does not plan for any acute surgical intervention but agrees with plan for transfer to [facility] for patient to be admitted under hospitalist service.  Case discussed with Dr. Antionette Char at [facility], who accepts patient in transfer.  His mental status is improving at this time, no longer appears intoxicated and is answering questions appropriately.  Patient expresses understanding agreement with plan for transfer.     ____________________________________________   FINAL CLINICAL IMPRESSION(S) / ED DIAGNOSES  Final diagnoses:  Cervical discitis  Acute osteomyelitis of cervical spine (HCC)  IVDU (intravenous drug user)     ED Discharge Orders    None       Note:  This document was prepared using Dragon voice recognition software and may include unintentional dictation errors.   Chesley Noon, MD 12/19/19 2238

## 2019-12-19 NOTE — ED Triage Notes (Addendum)
Pt via EMS from home. Per pt's significant other, pt was found in the barn unresponsive. Pt on arrival is very lethargic. Moaning out in pain stating, "my brain hurts, I am sorry." Pt is alert and oriented to self. Pt sinus tach on the monitor at 160bpm. Per girlfriend, pt had heroin this am.

## 2019-12-19 NOTE — ED Notes (Signed)
Patient transported to MRI. FLuids and Flagyl paused.

## 2019-12-20 ENCOUNTER — Encounter (HOSPITAL_COMMUNITY): Payer: Self-pay | Admitting: Family Medicine

## 2019-12-20 ENCOUNTER — Other Ambulatory Visit: Payer: Self-pay

## 2019-12-20 ENCOUNTER — Inpatient Hospital Stay (HOSPITAL_COMMUNITY): Payer: Self-pay

## 2019-12-20 DIAGNOSIS — F1990 Other psychoactive substance use, unspecified, uncomplicated: Secondary | ICD-10-CM | POA: Diagnosis present

## 2019-12-20 DIAGNOSIS — F419 Anxiety disorder, unspecified: Secondary | ICD-10-CM

## 2019-12-20 DIAGNOSIS — R55 Syncope and collapse: Secondary | ICD-10-CM | POA: Diagnosis present

## 2019-12-20 DIAGNOSIS — M4622 Osteomyelitis of vertebra, cervical region: Secondary | ICD-10-CM | POA: Diagnosis present

## 2019-12-20 DIAGNOSIS — M4800 Spinal stenosis, site unspecified: Secondary | ICD-10-CM

## 2019-12-20 DIAGNOSIS — G061 Intraspinal abscess and granuloma: Secondary | ICD-10-CM

## 2019-12-20 DIAGNOSIS — M4642 Discitis, unspecified, cervical region: Secondary | ICD-10-CM

## 2019-12-20 DIAGNOSIS — I361 Nonrheumatic tricuspid (valve) insufficiency: Secondary | ICD-10-CM

## 2019-12-20 DIAGNOSIS — F17200 Nicotine dependence, unspecified, uncomplicated: Secondary | ICD-10-CM

## 2019-12-20 DIAGNOSIS — Z91013 Allergy to seafood: Secondary | ICD-10-CM

## 2019-12-20 LAB — CBC WITH DIFFERENTIAL/PLATELET
Abs Immature Granulocytes: 0.02 10*3/uL (ref 0.00–0.07)
Basophils Absolute: 0 10*3/uL (ref 0.0–0.1)
Basophils Relative: 1 %
Eosinophils Absolute: 0.1 10*3/uL (ref 0.0–0.5)
Eosinophils Relative: 2 %
HCT: 29.9 % — ABNORMAL LOW (ref 39.0–52.0)
Hemoglobin: 9.8 g/dL — ABNORMAL LOW (ref 13.0–17.0)
Immature Granulocytes: 0 %
Lymphocytes Relative: 32 %
Lymphs Abs: 2 10*3/uL (ref 0.7–4.0)
MCH: 26.6 pg (ref 26.0–34.0)
MCHC: 32.8 g/dL (ref 30.0–36.0)
MCV: 81.3 fL (ref 80.0–100.0)
Monocytes Absolute: 0.4 10*3/uL (ref 0.1–1.0)
Monocytes Relative: 7 %
Neutro Abs: 3.6 10*3/uL (ref 1.7–7.7)
Neutrophils Relative %: 58 %
Platelets: 225 10*3/uL (ref 150–400)
RBC: 3.68 MIL/uL — ABNORMAL LOW (ref 4.22–5.81)
RDW: 13.7 % (ref 11.5–15.5)
WBC: 6.1 10*3/uL (ref 4.0–10.5)
nRBC: 0 % (ref 0.0–0.2)

## 2019-12-20 LAB — TYPE AND SCREEN
ABO/RH(D): O POS
Antibody Screen: NEGATIVE

## 2019-12-20 LAB — ECHOCARDIOGRAM COMPLETE
Height: 73 in
Weight: 2522.06 oz

## 2019-12-20 LAB — BLOOD CULTURE ID PANEL (REFLEXED)

## 2019-12-20 LAB — MAGNESIUM: Magnesium: 1.6 mg/dL — ABNORMAL LOW (ref 1.7–2.4)

## 2019-12-20 LAB — COMPREHENSIVE METABOLIC PANEL
ALT: 13 U/L (ref 0–44)
AST: 10 U/L — ABNORMAL LOW (ref 15–41)
Albumin: 2.6 g/dL — ABNORMAL LOW (ref 3.5–5.0)
Alkaline Phosphatase: 89 U/L (ref 38–126)
Anion gap: 8 (ref 5–15)
BUN: 16 mg/dL (ref 6–20)
CO2: 26 mmol/L (ref 22–32)
Calcium: 8.7 mg/dL — ABNORMAL LOW (ref 8.9–10.3)
Chloride: 103 mmol/L (ref 98–111)
Creatinine, Ser: 0.99 mg/dL (ref 0.61–1.24)
GFR calc Af Amer: >60 mL/min (ref >60)
GFR calc non Af Amer: >60 mL/min (ref >60)
Glucose, Bld: 88 mg/dL (ref 70–99)
Potassium: 3.8 mmol/L (ref 3.5–5.1)
Sodium: 137 mmol/L (ref 135–145)
Total Bilirubin: 0.6 mg/dL (ref 0.3–1.2)
Total Protein: 6.2 g/dL — ABNORMAL LOW (ref 6.5–8.1)

## 2019-12-20 LAB — CBC
HCT: 34 % — ABNORMAL LOW (ref 39.0–52.0)
Hemoglobin: 11.1 g/dL — ABNORMAL LOW (ref 13.0–17.0)
MCH: 26.3 pg (ref 26.0–34.0)
MCHC: 32.6 g/dL (ref 30.0–36.0)
MCV: 80.6 fL (ref 80.0–100.0)
Platelets: 241 10*3/uL (ref 150–400)
RBC: 4.22 MIL/uL (ref 4.22–5.81)
RDW: 13.7 % (ref 11.5–15.5)
WBC: 5.7 10*3/uL (ref 4.0–10.5)
nRBC: 0 % (ref 0.0–0.2)

## 2019-12-20 LAB — CREATININE, SERUM
Creatinine, Ser: 1.08 mg/dL (ref 0.61–1.24)
GFR calc Af Amer: >60 mL/min (ref >60)
GFR calc non Af Amer: >60 mL/min (ref >60)

## 2019-12-20 LAB — MRSA PCR SCREENING: MRSA by PCR: NEGATIVE

## 2019-12-20 LAB — ABO/RH: ABO/RH(D): O POS

## 2019-12-20 LAB — HIV ANTIBODY (ROUTINE TESTING W REFLEX): HIV Screen 4th Generation wRfx: NONREACTIVE — AB

## 2019-12-20 MED ORDER — GABAPENTIN 100 MG PO CAPS
100.0000 mg | ORAL_CAPSULE | Freq: Three times a day (TID) | ORAL | Status: DC
  Administered 2019-12-20 – 2020-02-02 (×130): 100 mg via ORAL
  Filled 2019-12-20 (×133): qty 1

## 2019-12-20 MED ORDER — ONDANSETRON HCL 4 MG PO TABS: 4.0000 mg | ORAL_TABLET | Freq: Four times a day (QID) | ORAL | Status: DC | PRN

## 2019-12-20 MED ORDER — POLYETHYLENE GLYCOL 3350 17 G PO PACK: 17.0000 g | PACK | Freq: Every day | ORAL | Status: DC | PRN

## 2019-12-20 MED ORDER — MORPHINE SULFATE (PF) 2 MG/ML IV SOLN
2.0000 mg | INTRAVENOUS | Status: DC | PRN
  Administered 2019-12-20: 01:00:00 2 mg via INTRAVENOUS
  Administered 2019-12-20 – 2019-12-22 (×14): 4 mg via INTRAVENOUS
  Administered 2019-12-23: 10:00:00 2 mg via INTRAVENOUS
  Administered 2019-12-23: 02:00:00 4 mg via INTRAVENOUS
  Administered 2019-12-23: 16:00:00 2 mg via INTRAVENOUS
  Filled 2019-12-20: qty 2
  Filled 2019-12-20 (×2): qty 1
  Filled 2019-12-20 (×14): qty 2
  Filled 2019-12-20: qty 1

## 2019-12-20 MED ORDER — SODIUM CHLORIDE 0.45 % IV SOLN: INTRAVENOUS | Status: AC

## 2019-12-20 MED ORDER — SODIUM CHLORIDE 0.9% FLUSH
3.0000 mL | Freq: Two times a day (BID) | INTRAVENOUS | Status: DC
  Administered 2019-12-20 – 2019-12-28 (×13): 3 mL via INTRAVENOUS

## 2019-12-20 MED ORDER — VANCOMYCIN HCL 1750 MG/350ML IV SOLN
1750.0000 mg | Freq: Two times a day (BID) | INTRAVENOUS | Status: DC
  Administered 2019-12-20 – 2019-12-21 (×4): 1750 mg via INTRAVENOUS
  Filled 2019-12-20 (×5): qty 350

## 2019-12-20 MED ORDER — SODIUM CHLORIDE 0.9 % IV SOLN
2.0000 g | INTRAVENOUS | Status: AC
  Administered 2019-12-20 – 2020-02-02 (×45): 2 g via INTRAVENOUS
  Filled 2019-12-20 (×44): qty 20

## 2019-12-20 MED ORDER — NICOTINE 14 MG/24HR TD PT24
14.0000 mg | MEDICATED_PATCH | Freq: Every day | TRANSDERMAL | Status: DC
  Administered 2019-12-20 – 2020-01-12 (×19): 14 mg via TRANSDERMAL
  Filled 2019-12-20 (×31): qty 1

## 2019-12-20 MED ORDER — ENOXAPARIN SODIUM 40 MG/0.4ML ~~LOC~~ SOLN
40.0000 mg | SUBCUTANEOUS | Status: DC
  Administered 2019-12-20 – 2019-12-26 (×6): 40 mg via SUBCUTANEOUS
  Filled 2019-12-20 (×6): qty 0.4

## 2019-12-20 MED ORDER — MAGNESIUM SULFATE 2 GM/50ML IV SOLN
2.0000 g | Freq: Once | INTRAVENOUS | Status: AC
  Administered 2019-12-20: 10:00:00 2 g via INTRAVENOUS
  Filled 2019-12-20: qty 50

## 2019-12-20 MED ORDER — ONDANSETRON HCL 4 MG/2ML IJ SOLN: 4.0000 mg | Freq: Four times a day (QID) | INTRAMUSCULAR | Status: DC | PRN

## 2019-12-20 MED ORDER — ACETAMINOPHEN 650 MG RE SUPP: 650.0000 mg | Freq: Four times a day (QID) | RECTAL | Status: DC | PRN

## 2019-12-20 MED ORDER — ACETAMINOPHEN 325 MG PO TABS
650.0000 mg | ORAL_TABLET | Freq: Four times a day (QID) | ORAL | Status: DC | PRN
  Administered 2019-12-20 – 2019-12-21 (×2): 650 mg via ORAL
  Filled 2019-12-20 (×2): qty 2

## 2019-12-20 NOTE — Progress Notes (Signed)
PROGRESS NOTE    Michael Lester    Code Status: Full Code  YQM:[id number] DOB: 07/27/1986 DOA: 12/19/2019 LOS: 1 days  PCP: Patient, [name] CC: No chief complaint on file.      Hospital Summary  Michael Lester is a 34 y.o. male with a history of IV drug use who initially presented to [facility] after being found unresponsive and complaining of headache and neck pain x2 weeks at the base of his skull radiating to right shoulder with associated paresthesias of right first, second and third fingers as well as recurrent syncopal episodes over the same interval.  At [facility], he was found to be afebrile, saturating well on room air, tachycardic as high as 160s, and with stable blood pressure.    Sinus tach on EKG, RSR' in V1 and suspected LVH with anterior ST elevation.  Chest x-ray is negative for acute cardiopulmonary disease.  Cr 1.37, and glucose of 190.    WBC 14,400 and mild normocytic anemia.  Lactic acid is reassuringly normal.  CT head is negative for acute intracranial abnormality and CT cervical spine is negative for acute fracture but features an abnormal lucency at the adjacent endplates of C6-7.  This was followed by MRI cervical spine which is concerning for discitis osteomyelitis at C6-7 with prevertebral edema and phlegmon resulting in spinal stenosis and cord mass-effect without definite cord signal abnormality and without drainable fluid collection. 2 L of lactated Ringer's were administered, and the patient was treated with cefepime, vancomycin, and Flagyl.  Neurosurgery at [facility] was consulted by the ED physician, did not anticipate any urgent surgical intervention but recommended admission to the hospitalist service at [facility] for ongoing treatment.   Blood cultures positive for methicillin-resistant coag negative staph.  ID was consulted.  A & P   Principal Problem:   Septic discitis of cervical region Active Problems:   IV drug abuse (HCC)    Syncope   1. 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 Blood culture positive for methicillin-resistant coag negative staph b. ID consulted c. ED MD discussed with neurosurgery who recommended medical management d. Continue vancomycin and ceftriaxone pending further ID recommendations e. Follow-up echo f. Follow-up with neurosurgery 2. Methicillin-resistant coag negative staph bacteremia a. will repeat blood cultures 3. Recurrent syncope, unknown etiology likely related to above a. Sinus tachycardia on EKG and likely LVH, not concerning for STEMI on personal read b. Follow-up echo and continue telemetry 4. Hypomagnesemia  a. Replete via IV and follow-up 5. Normocytic anemia a. Hb 11.3> 9.8 without signs of bleed, possibly dilutional b. Iron studies 6. IV drug use, daily heroin use a. Continue to advise cessation b. Patient denies withdrawal symptoms at this point c. SW on board  DVT prophylaxis: SCDs, Lovenox Family Communication: Patient updated at bedside Disposition Plan:   Patient came from:   Home                                                                                          Anticipated d/c place: TBD  Barriers to d/c: Medical stability  Pressure injury documentation    None  Consultants  Infectious disease  Procedures  None  Antibiotics   Anti-infectives (From admission, onward)   Start     Dose/Rate Route Frequency Ordered Stop   12/20/19 0600  vancomycin (VANCOREADY) IVPB 1750 mg/350 mL     1,750 mg 175 mL/hr over 120 Minutes Intravenous Every 12 hours 12/20/19 0042     12/20/19 0030  cefTRIAXone (ROCEPHIN) 2 g in sodium chloride 0.9 % 100 mL IVPB     2 g 200 mL/hr over 30 Minutes Intravenous Every 24 hours 12/20/19 0027          Subjective  Reports persistent neck pain, right shoulder pain and paresthesias of first and second digits and reported his having mild left shoulder pain now.  Denies chest pain or shortness of breath.  Denies symptoms of  heroin withdrawal.  States he injects heroin daily into his upper extremities. denies leg swelling or any other complaints.  Objective   Vitals:   12/20/19 0135 12/20/19 0321 12/20/19 0800 12/20/19 0929  BP: 123/83 111/64  122/69  Pulse: 60 70 69 66  Resp: 10 10 13 10   Temp: 98.3 F (36.8 C) 98.3 F (36.8 C)  98.1 F (36.7 C)  TempSrc: Oral Oral  Oral  SpO2:  96% 99% 100%  Weight: 71.5 kg     Height: 6\' 1"  (1.854 m)       Intake/Output Summary (Last 24 hours) at 12/20/2019 1241 Last data filed at 12/20/2019 0900 Gross per 24 hour  Intake 489.18 ml  Output 975 ml  Net -485.82 ml   Filed Weights   12/20/19 0135  Weight: 71.5 kg    Examination:  Physical Exam Vitals and nursing note reviewed.  Constitutional:      Appearance: He is ill-appearing and diaphoretic.  HENT:     Head: Normocephalic.     Mouth/Throat:     Mouth: Mucous membranes are moist.  Eyes:     Conjunctiva/sclera: Conjunctivae normal.  Neck:     Comments: Poor Neck ROM due to pain Cardiovascular:     Rate and Rhythm: Regular rhythm. Tachycardia present.     Heart sounds: No murmur.  Pulmonary:     Effort: Pulmonary effort is normal. No respiratory distress.     Breath sounds: Normal breath sounds.  Abdominal:     General: Abdomen is flat.     Palpations: Abdomen is soft.  Musculoskeletal:        General: No swelling or tenderness.  Neurological:     Mental Status: He is alert.     Comments: Paresthesias of right first and second distal digit  Psychiatric:        Mood and Affect: Mood normal.        Behavior: Behavior normal.     Data Reviewed: I have personally reviewed following labs and imaging studies  CBC: Recent Labs  Lab 12/19/19 1515 12/20/19 0439  WBC 14.4* 6.1  NEUTROABS 12.6* 3.6  HGB 11.3* 9.8*  HCT 35.1* 29.9*  MCV 81.6 81.3  PLT 325 225   Basic Metabolic Panel: Recent Labs  Lab 12/19/19 1515 12/20/19 0439  NA 138 137  K 4.7 3.8  CL 105 103  CO2 24 26   GLUCOSE 195* 88  BUN 23* 16  CREATININE 1.19 0.99  CALCIUM 9.0 8.7*  MG  --  1.6*   GFR: Estimated Creatinine Clearance: 105.3 mL/min (by C-G formula based on SCr of 0.99 mg/dL). Liver Function Tests: Recent Labs  Lab 12/19/19 1515 12/20/19 0439  AST 17 10*  ALT 15 13  ALKPHOS 112 89  BILITOT 0.2* 0.6  PROT 8.4* 6.2*  ALBUMIN 3.6 2.6*   No results for input(s): LIPASE, AMYLASE in the last 168 hours. No results for input(s): AMMONIA in the last 168 hours. Coagulation Profile: Recent Labs  Lab 12/19/19 1743  INR 1.2   Cardiac Enzymes: Recent Labs  Lab 12/19/19 1515  CKTOTAL 86   BNP (last 3 results) No results for input(s): PROBNP in the last 8760 hours. HbA1C: No results for input(s): HGBA1C in the last 72 hours. CBG: No results for input(s): GLUCAP in the last 168 hours. Lipid Profile: No results for input(s): CHOL, HDL, LDLCALC, TRIG, CHOLHDL, LDLDIRECT in the last 72 hours. Thyroid Function Tests: No results for input(s): TSH, T4TOTAL, FREET4, T3FREE, THYROIDAB in the last 72 hours. Anemia Panel: No results for input(s): VITAMINB12, FOLATE, FERRITIN, TIBC, IRON, RETICCTPCT in the last 72 hours. Sepsis Labs: Recent Labs  Lab 12/19/19 1541  LATICACIDVEN 1.2    Recent Results (from the past 240 hour(s))  Culture, blood (routine x 2)     Status: None (Preliminary result)   Collection Time: 12/19/19  3:41 PM   Specimen: BLOOD  Result Value Ref Range Status   Specimen Description BLOOD  R FOREARM  Final   Special Requests   Final    BOTTLES DRAWN AEROBIC AND ANAEROBIC Blood Culture adequate volume   Culture   Final    NO GROWTH < 24 HOURS Performed at [facility], [address] [place], [place] [address]    Report Status PENDING  Incomplete  Culture, blood (routine x 2)     Status: None (Preliminary result)   Collection Time: 12/19/19  3:41 PM   Specimen: BLOOD  Result Value Ref Range Status   Specimen Description BLOOD  L FOREARM   Final   Special Requests   Final    BOTTLES DRAWN AEROBIC AND ANAEROBIC Blood Culture adequate volume   Culture  Setup Time   Final    Organism ID to follow GRAM POSITIVE COCCI IN BOTH AEROBIC AND ANAEROBIC BOTTLES CRITICAL RESULT CALLED TO, READ BACK BY AND VERIFIED WITH: JEENA PIGG AND V BRYAK AT 0719 ON 12/20/19 SNG Performed at [facility], [address] [place], [place] [address]    Culture GRAM POSITIVE COCCI  Final   Report Status PENDING  Incomplete  Blood Culture ID Panel (Reflexed)     Status: Abnormal   Collection Time: 12/19/19  3:41 PM  Result Value Ref Range Status   Enterococcus species NOT DETECTED NOT DETECTED Final   Listeria monocytogenes NOT DETECTED NOT DETECTED Final   Staphylococcus species DETECTED (A) NOT DETECTED Final    Comment: Methicillin (oxacillin) resistant coagulase negative staphylococcus. Possible blood culture contaminant (unless isolated from more than one blood culture draw or clinical case suggests pathogenicity). No antibiotic treatment is indicated for blood  culture contaminants. CRITICAL RESULT CALLED TO, READ BACK BY AND VERIFIED WITH: KAREN PATTON AT 0944 ON 12/20/19 SNG    Staphylococcus aureus (BCID) NOT DETECTED NOT DETECTED Final   Methicillin resistance DETECTED (A) NOT DETECTED Final    Comment: CRITICAL RESULT CALLED TO, READ BACK BY AND VERIFIED WITH: KAREN PATTON AT 0944 ON 12/20/19 SNG    Streptococcus species NOT DETECTED NOT DETECTED Final   Streptococcus agalactiae NOT DETECTED NOT DETECTED Final   Streptococcus pneumoniae NOT DETECTED NOT DETECTED Final   Streptococcus pyogenes NOT DETECTED NOT DETECTED Final   Acinetobacter baumannii  NOT DETECTED NOT DETECTED Final   Enterobacteriaceae species NOT DETECTED NOT DETECTED Final   Enterobacter cloacae complex NOT DETECTED NOT DETECTED Final   Escherichia coli NOT DETECTED NOT DETECTED Final   Klebsiella oxytoca NOT DETECTED NOT DETECTED Final   Klebsiella  pneumoniae NOT DETECTED NOT DETECTED Final   Proteus species NOT DETECTED NOT DETECTED Final   Serratia marcescens NOT DETECTED NOT DETECTED Final   Haemophilus influenzae NOT DETECTED NOT DETECTED Final   Neisseria meningitidis NOT DETECTED NOT DETECTED Final   Pseudomonas aeruginosa NOT DETECTED NOT DETECTED Final   Candida albicans NOT DETECTED NOT DETECTED Final   Candida glabrata NOT DETECTED NOT DETECTED Final   Candida krusei NOT DETECTED NOT DETECTED Final   Candida parapsilosis NOT DETECTED NOT DETECTED Final   Candida tropicalis NOT DETECTED NOT DETECTED Final    Comment: Performed at [facility], [address] [place], [place] [address]  Respiratory Panel by RT PCR (Flu A&B, Covid) - Nasopharyngeal Swab     Status: None   Collection Time: 12/19/19  5:43 PM   Specimen: Nasopharyngeal Swab  Result Value Ref Range Status   SARS Coronavirus 2 by RT PCR NEGATIVE NEGATIVE Final    Comment: (NOTE) SARS-CoV-2 target nucleic acids are NOT DETECTED. The SARS-CoV-2 RNA is generally detectable in upper respiratoy specimens during the acute phase of infection. The lowest concentration of SARS-CoV-2 viral copies this assay can detect is 131 copies/mL. A negative result does not preclude SARS-Cov-2 infection and should not be used as the sole basis for treatment or other patient management decisions. A negative result may occur with  improper specimen collection/handling, submission of specimen other than nasopharyngeal swab, presence of viral mutation(s) within the areas targeted by this assay, and inadequate number of viral copies (<131 copies/mL). A negative result must be combined with clinical observations, patient history, and epidemiological information. The expected result is Negative. Fact Sheet for Patients:  https://www.moore.com/ Fact Sheet for Healthcare Providers:  https://www.young.biz/ This test is not yet ap proved or  cleared by the [place] FDA and  has been authorized for detection and/or diagnosis of SARS-CoV-2 by FDA under an Emergency Use Authorization (EUA). This EUA will remain  in effect (meaning this test can be used) for the duration of the COVID-19 declaration under Section 564(b)(1) of the Act, 21 U.S.C. section 360bbb-3(b)(1), unless the authorization is terminated or revoked sooner.    Influenza A by PCR NEGATIVE NEGATIVE Final   Influenza B by PCR NEGATIVE NEGATIVE Final    Comment: (NOTE) The Xpert Xpress SARS-CoV-2/FLU/RSV assay is intended as an aid in  the diagnosis of influenza from Nasopharyngeal swab specimens and  should not be used as a sole basis for treatment. Nasal washings and  aspirates are unacceptable for Xpert Xpress SARS-CoV-2/FLU/RSV  testing. Fact Sheet for Patients: https://www.moore.com/ Fact Sheet for Healthcare Providers: https://www.young.biz/ This test is not yet approved or cleared by the [place] FDA and  has been authorized for detection and/or diagnosis of SARS-CoV-2 by  FDA under an Emergency Use Authorization (EUA). This EUA will remain  in effect (meaning this test can be used) for the duration of the  Covid-19 declaration under Section 564(b)(1) of the Act, 21  U.S.C. section 360bbb-3(b)(1), unless the authorization is  terminated or revoked. Performed at [facility], [address] [place], [place] [address]   MRSA PCR Screening     Status: None   Collection Time: 12/20/19 12:54 AM   Specimen: Nasal Mucosa;  Nasopharyngeal  Result Value Ref Range Status   MRSA by PCR NEGATIVE NEGATIVE Final    Comment:        The GeneXpert MRSA Assay (FDA approved for NASAL specimens only), is one component of a comprehensive MRSA colonization surveillance program. It is not intended to diagnose MRSA infection nor to guide or monitor treatment for MRSA infections. Performed at [facility], [address] [place], [place] [id number]          Radiology Studies: CT Head Wo Contrast  Result Date: 12/19/2019 CLINICAL DATA:  Found down EXAM: CT HEAD WITHOUT CONTRAST CT CERVICAL SPINE WITHOUT CONTRAST TECHNIQUE: Multidetector CT imaging of the head and cervical spine was performed following the standard protocol without intravenous contrast. Multiplanar CT image reconstructions of the cervical spine were also generated. COMPARISON:  None. FINDINGS: CT HEAD FINDINGS Brain: There is no mass, hemorrhage or extra-axial collection. The size and configuration of the ventricles and extra-axial CSF spaces are normal. The brain parenchyma is normal, without evidence of acute or chronic infarction. Vascular: No abnormal hyperdensity of the major intracranial arteries or dural venous sinuses. No intracranial atherosclerosis. Skull: The visualized skull base, calvarium and extracranial soft tissues are normal. Sinuses/Orbits: No fluid levels or advanced mucosal thickening of the visualized paranasal sinuses. No mastoid or middle ear effusion. The orbits are normal. CT CERVICAL SPINE FINDINGS Alignment: Grade 1 retrolisthesis at C6-7. Skull base and vertebrae: There is abnormal lucency at the adjacent endplates of C6-7. no fracture. Soft tissues and spinal canal: No prevertebral fluid or swelling. No visible canal hematoma. Disc levels: No advanced spinal canal or neural foraminal stenosis. Upper chest: No pneumothorax, pulmonary nodule or pleural effusion. Other: Normal visualized paraspinal cervical soft tissues. IMPRESSION: 1. No acute intracranial abnormality. 2. No acute fracture of the cervical spine. 3. Abnormal lucency at the adjacent endplates of C6-7. This may be a manifestation of degenerative disc disease, but the appearance is more concerning for discitis osteomyelitis in a patient with history of IV drug use. MRI of the cervical spine with and without contrast is recommended for further  characterization. Electronically Signed   By: Deatra RobinsonKevin  Herman M.D.   On: 12/19/2019 16:42   CT Cervical Spine Wo Contrast  Result Date: 12/19/2019 CLINICAL DATA:  Found down EXAM: CT HEAD WITHOUT CONTRAST CT CERVICAL SPINE WITHOUT CONTRAST TECHNIQUE: Multidetector CT imaging of the head and cervical spine was performed following the standard protocol without intravenous contrast. Multiplanar CT image reconstructions of the cervical spine were also generated. COMPARISON:  None. FINDINGS: CT HEAD FINDINGS Brain: There is no mass, hemorrhage or extra-axial collection. The size and configuration of the ventricles and extra-axial CSF spaces are normal. The brain parenchyma is normal, without evidence of acute or chronic infarction. Vascular: No abnormal hyperdensity of the major intracranial arteries or dural venous sinuses. No intracranial atherosclerosis. Skull: The visualized skull base, calvarium and extracranial soft tissues are normal. Sinuses/Orbits: No fluid levels or advanced mucosal thickening of the visualized paranasal sinuses. No mastoid or middle ear effusion. The orbits are normal. CT CERVICAL SPINE FINDINGS Alignment: Grade 1 retrolisthesis at C6-7. Skull base and vertebrae: There is abnormal lucency at the adjacent endplates of C6-7. no fracture. Soft tissues and spinal canal: No prevertebral fluid or swelling. No visible canal hematoma. Disc levels: No advanced spinal canal or neural foraminal stenosis. Upper chest: No pneumothorax, pulmonary nodule or pleural effusion. Other: Normal visualized paraspinal cervical soft tissues. IMPRESSION: 1. No acute intracranial abnormality. 2. No  acute fracture of the cervical spine. 3. Abnormal lucency at the adjacent endplates of L3-8. This may be a manifestation of degenerative disc disease, but the appearance is more concerning for discitis osteomyelitis in a patient with history of IV drug use. MRI of the cervical spine with and without contrast is recommended  for further characterization. Electronically Signed   By: Ulyses Jarred M.D.   On: 12/19/2019 16:42   MR Cervical Spine W or Wo Contrast  Result Date: 12/19/2019 CLINICAL DATA:  34 year old male found down. Cervical spine CT earlier today suspicious for discitis osteomyelitis at C6-C7. EXAM: MRI CERVICAL SPINE WITHOUT AND WITH CONTRAST TECHNIQUE: Multiplanar and multiecho pulse sequences of the cervical spine, to include the craniocervical junction and cervicothoracic junction, were obtained without and with intravenous contrast. CONTRAST:  67mL GADAVIST GADOBUTROL 1 MMOL/ML IV SOLN COMPARISON:  CT cervical spine 1622 hours today. FINDINGS: Alignment: Stable straightening of cervical lordosis with mild retrolisthesis at C6-C7. Vertebrae: Confluent abnormal marrow edema and enhancement in the C6 and C7 vertebral bodies. Mild right side pedicle edema also. Loss of the intervening disc space which is mostly low signal on MRI, however, there is abundant prevertebral and longus coli muscle edema emanating from that level. Superimposed retropharyngeal effusion. Abnormal thickened and enhancing soft tissue in the ventral right epidural space (series 14 image 7 and series 15, image 24). No drainable fluid collection identified. Inflammation appears to extend into the right C7 neural foramen. No superimposed ventral dural thickening, although there does appear to be mild dorsal dural thickening extending cephalad from that level. The C5 and the T1 levels are not inflamed. There is chronic C5-C6 disc and endplate degeneration. Cord: Spinal stenosis and spinal cord mass effect at C5-C6 (degenerative) and C6-C7 (inflammatory) but no definite abnormal cord signal. No abnormal intradural enhancement. Posterior Fossa, vertebral arteries, paraspinal tissues: Cervicomedullary junction is within normal limits. Negative visible posterior fossa. Preserved major vascular flow voids in the neck. Abnormal prevertebral soft tissues  maximal at C6-C7 as detailed above. Pronounced bilateral longus coli muscle edema at C6 and C7. No drainable fluid collection. Disc levels: Abnormal C6-C7 level detailed above. Superimposed chronic appearing disc and endplate degeneration at C5-C6 with right eccentric disc osteophyte complex. Mild spinal stenosis, mild spinal cord mass effect, mild to moderate left and severe right C6 foraminal stenosis. IMPRESSION: 1. Positive for Discitis Osteomyelitis at C6-C7. Mild retrolisthesis at that level with prevertebral fluid and edema, and phlegmon in the ventral epidural space resulting in spinal stenosis and spinal cord mass effect. But spinal cord signal abnormality. And no drainable fluid collection. 2. Superimposed chronic C5-C6 degeneration with degenerative spinal and severe right foraminal stenosis. Electronically Signed   By: Genevie Ann M.D.   On: 12/19/2019 19:39   DG Chest Portable 1 View  Result Date: 12/19/2019 CLINICAL DATA:  Altered mental status, lethargy EXAM: PORTABLE CHEST 1 VIEW COMPARISON:  12/06/2019 FINDINGS: The heart size and mediastinal contours are within normal limits. Both lungs are clear. The visualized skeletal structures are unremarkable. IMPRESSION: No active disease. Electronically Signed   By: Jerilynn Mages.  Shick M.D.   On: 12/19/2019 18:05        Scheduled Meds: . gabapentin  100 mg Oral TID  . nicotine  14 mg Transdermal Daily  . sodium chloride flush  3 mL Intravenous Q12H   Continuous Infusions: . sodium chloride 85 mL/hr at 12/20/19 0700  . cefTRIAXone (ROCEPHIN)  IV 2 g (12/20/19 0128)  . vancomycin 1,750 mg (12/20/19 0428)  Time spent: 30 minutes with over 50% of the time coordinating the patient's care    Harold Hedge, DO [facility] Pager [phone]  Call night coverage person covering after 7pm

## 2019-12-20 NOTE — Progress Notes (Signed)
Patient has withdrawal scores every 4 hours and I do not have the tab for scoring. He was a 0 at admission.

## 2019-12-20 NOTE — Discharge Instructions (Signed)
Buprenorphine providers  Dr. Ronita Hipps, MD [address] [place], [place] [address] [phone] Fax : [phone]  Dr. Dolores Frame, MD [address] [place], [place] [address] [phone] Fax : [phone]  Dr. Caryn Section, MD [address] [facility] [address] [place], [place] [address] [phone] Fax : [phone]  Buprenorphine walk-in clinics  [facility] Address: [address] [place], [place] [address] Hours:  Closed ? Opens 5:30AM Mon Updated by business 2 days ago Phone: [phone]  [facility]  [address] Closed ? Opens 5AM Mon  [phone]

## 2019-12-20 NOTE — Progress Notes (Signed)
Pharmacy Antibiotic Note  Michael Lester is a 34 y.o. male admitted on 12/19/2019 with osteomyelitis.  Pharmacy has been consulted for vancomycin dosing. Vancomycin 1gm given at 20:17  Plan: Continue vancomycin 1750 mg IV q12 hours F/u renal function, cultures and vanc peak and trough     Temp (24hrs), Avg:98.5 F (36.9 C), Min:98.3 F (36.8 C), Max:98.7 F (37.1 C)  Recent Labs  Lab 12/19/19 1515 12/19/19 1541  WBC 14.4*  --   CREATININE 1.19  --   LATICACIDVEN  --  1.2    Estimated Creatinine Clearance: 83.3 mL/min (by C-G formula based on SCr of 1.19 mg/dL).    Allergies  Allergen Reactions  . Shellfish Allergy Anaphylaxis     Thank you for allowing pharmacy to be a part of this patient's care.  Woodfin Ganja 12/20/2019 12:36 AM

## 2019-12-20 NOTE — Consult Note (Signed)
Date of Admission:  12/19/2019          Reason for Consult: Discitis Osteomyelitis at C6-C7.    Referring Provider: Dr. Neysa Bonito   Assessment:   1. Diskitis, Osteomyelitis at C6-C7. With Mild retrolisthesis at that level with prevertebral fluid and edema, 2. phlegmon in the ventral epidural space resulting in spinal stenosis and spinal cord mass effect. 3. IVDU 4. Coag negative staphylococcal species in 1 of 2 blood cultures likely contaminant     Plan:  1. Agree with continue vancomycin and ceftriaxone for now 2. Await neurosurgical input if they operate we can get some deep cultures to help guide therapy 3. Ideally should be treated with 6 to 8 weeks of parenteral antibiotics. 4. Check HIV hep C and hep B. 5. Check sed rate and CRP   Dr. Baxter Flattery to take over the service tomorrow.   Principal Problem:   Septic discitis of cervical region Active Problems:   IV drug abuse (HCC)   Syncope   Scheduled Meds: . enoxaparin (LOVENOX) injection  40 mg Subcutaneous Q24H  . gabapentin  100 mg Oral TID  . nicotine  14 mg Transdermal Daily  . sodium chloride flush  3 mL Intravenous Q12H   Continuous Infusions: . sodium chloride 85 mL/hr at 12/20/19 0700  . cefTRIAXone (ROCEPHIN)  IV 2 g (12/20/19 0128)  . vancomycin 1,750 mg (12/20/19 0428)   PRN Meds:.acetaminophen **OR** acetaminophen, morphine injection, ondansetron **OR** ondansetron (ZOFRAN) IV, polyethylene glycol  HPI: [name] is a 34 y.o. male [name] is a 34 y.o. male with a history of IV drug use who initially presented to [facility] after being found unresponsive and complaining of headache and neck pain x2 weeks at the base of his skull radiating to right shoulder with associated paresthesias of right first, second and third fingers as well as recurrent syncopal episodes over the same interval.    At [facility] regional he was tachycardic with leukocytosis and acute kidney injury.  Blood cultures were drawn  which have now grown a coagulase-negative staphylococcal species in 1 of 2 blood cultures.  MRI of the cervical spine was performed which showed evidence of discitis at C6-C7 with prevertebral edema and a phlegmon with spinal stenosis and cord mass-effect.  He has been started on vancomycin and ceftriaxone.  He has not yet been seen by neurosurgery.  When I saw him today he does have weaker grip on his right hand and states he has numbness that is present as well.  He does endorse active heroin use but says he will stay in the hospital to be treated for now.  His main complaint was that he was hungry as he is n.p.o. for possible surgery.       Review of Systems: Review of Systems  Constitutional: Positive for fever. Negative for chills, diaphoresis, malaise/fatigue and weight loss.  HENT: Negative for congestion, hearing loss, sore throat and tinnitus.   Eyes: Negative for blurred vision and double vision.  Respiratory: Negative for cough, sputum production, shortness of breath and wheezing.   Cardiovascular: Negative for chest pain, palpitations and leg swelling.  Gastrointestinal: Negative for abdominal pain, blood in stool, constipation, diarrhea, heartburn, melena, nausea and vomiting.  Genitourinary: Negative for dysuria, flank pain and hematuria.  Musculoskeletal: Positive for myalgias and neck pain. Negative for back pain, falls and joint pain.  Skin: Negative for itching and rash.  Neurological: Negative for dizziness, sensory change, focal weakness, loss of consciousness,  weakness and headaches.  Endo/Heme/Allergies: Does not bruise/bleed easily.  Psychiatric/Behavioral: Positive for substance abuse. Negative for depression, memory loss and suicidal ideas. The patient is not nervous/anxious.     History reviewed. No pertinent past medical history.  Social History   Tobacco Use  . Smoking status: Current Every Day Smoker    Packs/day: 0.50  . Smokeless tobacco: Never  Used  Substance Use Topics  . Alcohol use: Not Currently  . Drug use: Yes    Types: Marijuana, Heroin    Comment: daily     History reviewed. No pertinent family history. Allergies  Allergen Reactions  . Shellfish Allergy Anaphylaxis    OBJECTIVE: Blood pressure 122/69, pulse 66, temperature 98.1 F (36.7 C), temperature source Oral, resp. rate 10, height 6\' 1"  (1.854 m), weight 71.5 kg, SpO2 100 %.  Physical Exam Constitutional:      General: He is not in acute distress.    Appearance: Normal appearance. He is well-developed. He is not ill-appearing or diaphoretic.  HENT:     Head: Normocephalic and atraumatic.     Right Ear: Hearing and external ear normal.     Left Ear: Hearing and external ear normal.     Nose: No nasal deformity or rhinorrhea.  Eyes:     General: No scleral icterus.    Conjunctiva/sclera: Conjunctivae normal.     Right eye: Right conjunctiva is not injected.     Left eye: Left conjunctiva is not injected.     Pupils: Pupils are equal, round, and reactive to light.  Neck:     Vascular: No JVD.  Cardiovascular:     Rate and Rhythm: Normal rate and regular rhythm.     Heart sounds: Normal heart sounds, S1 normal and S2 normal. No murmur. No friction rub. No gallop.   Pulmonary:     Effort: Pulmonary effort is normal. No respiratory distress.     Breath sounds: Normal breath sounds. No stridor.  Abdominal:     General: Abdomen is flat. Bowel sounds are normal. There is no distension.     Palpations: Abdomen is soft.     Tenderness: There is no abdominal tenderness.  Musculoskeletal:        General: Normal range of motion.     Right shoulder: Normal.     Left shoulder: Normal.     Cervical back: Normal range of motion and neck supple.     Right hip: Normal.     Left hip: Normal.     Right knee: Normal.     Left knee: Normal.  Lymphadenopathy:     Head:     Right side of head: No submandibular, preauricular or posterior auricular adenopathy.      Left side of head: No submandibular, preauricular or posterior auricular adenopathy.     Cervical: No cervical adenopathy.     Right cervical: No superficial or deep cervical adenopathy.    Left cervical: No superficial or deep cervical adenopathy.  Skin:    General: Skin is warm and dry.     Coloration: Skin is not pale.     Findings: No abrasion, bruising, ecchymosis, erythema, lesion or rash.     Nails: There is no clubbing.  Neurological:     Mental Status: He is alert and oriented to person, place, and time.     Coordination: Coordination normal.     Gait: Gait normal.     Comments: His grip is weaker in R vs LUE  Psychiatric:  Attention and Perception: He is attentive.        Mood and Affect: Mood is anxious.        Speech: Speech normal.        Behavior: Behavior normal. Behavior is cooperative.        Thought Content: Thought content normal.        Judgment: Judgment normal.     Lab Results Lab Results  Component Value Date   WBC 6.1 12/20/2019   HGB 9.8 (L) 12/20/2019   HCT 29.9 (L) 12/20/2019   MCV 81.3 12/20/2019   PLT 225 12/20/2019    Lab Results  Component Value Date   CREATININE 0.99 12/20/2019   BUN 16 12/20/2019   NA 137 12/20/2019   K 3.8 12/20/2019   CL 103 12/20/2019   CO2 26 12/20/2019    Lab Results  Component Value Date   ALT 13 12/20/2019   AST 10 (L) 12/20/2019   ALKPHOS 89 12/20/2019   BILITOT 0.6 12/20/2019     Microbiology: Recent Results (from the past 240 hour(s))  Culture, blood (routine x 2)     Status: None (Preliminary result)   Collection Time: 12/19/19  3:41 PM   Specimen: BLOOD  Result Value Ref Range Status   Specimen Description BLOOD  R FOREARM  Final   Special Requests   Final    BOTTLES DRAWN AEROBIC AND ANAEROBIC Blood Culture adequate volume   Culture   Final    NO GROWTH < 24 HOURS Performed at [facility], [address], [place], [place] [address]    Report Status PENDING  Incomplete    Culture, blood (routine x 2)     Status: None (Preliminary result)   Collection Time: 12/19/19  3:41 PM   Specimen: BLOOD  Result Value Ref Range Status   Specimen Description BLOOD  L FOREARM  Final   Special Requests   Final    BOTTLES DRAWN AEROBIC AND ANAEROBIC Blood Culture adequate volume   Culture  Setup Time   Final    Organism ID to follow GRAM POSITIVE COCCI IN BOTH AEROBIC AND ANAEROBIC BOTTLES CRITICAL RESULT CALLED TO, READ BACK BY AND VERIFIED WITH: JEENA PIGG AND V BRYAK AT 0719 ON 12/20/19 SNG Performed at [facility], [address] [place], [place] [address]    Culture GRAM POSITIVE COCCI  Final   Report Status PENDING  Incomplete  Blood Culture ID Panel (Reflexed)     Status: Abnormal   Collection Time: 12/19/19  3:41 PM  Result Value Ref Range Status   Enterococcus species NOT DETECTED NOT DETECTED Final   Listeria monocytogenes NOT DETECTED NOT DETECTED Final   Staphylococcus species DETECTED (A) NOT DETECTED Final    Comment: Methicillin (oxacillin) resistant coagulase negative staphylococcus. Possible blood culture contaminant (unless isolated from more than one blood culture draw or clinical case suggests pathogenicity). No antibiotic treatment is indicated for blood  culture contaminants. CRITICAL RESULT CALLED TO, READ BACK BY AND VERIFIED WITH: KAREN PATTON AT 0944 ON 12/20/19 SNG    Staphylococcus aureus (BCID) NOT DETECTED NOT DETECTED Final   Methicillin resistance DETECTED (A) NOT DETECTED Final    Comment: CRITICAL RESULT CALLED TO, READ BACK BY AND VERIFIED WITH: KAREN PATTON AT 0944 ON 12/20/19 SNG    Streptococcus species NOT DETECTED NOT DETECTED Final   Streptococcus agalactiae NOT DETECTED NOT DETECTED Final   Streptococcus pneumoniae NOT DETECTED NOT DETECTED Final   Streptococcus pyogenes NOT DETECTED NOT DETECTED  Final   Acinetobacter baumannii NOT DETECTED NOT DETECTED Final   Enterobacteriaceae species NOT DETECTED NOT  DETECTED Final   Enterobacter cloacae complex NOT DETECTED NOT DETECTED Final   Escherichia coli NOT DETECTED NOT DETECTED Final   Klebsiella oxytoca NOT DETECTED NOT DETECTED Final   Klebsiella pneumoniae NOT DETECTED NOT DETECTED Final   Proteus species NOT DETECTED NOT DETECTED Final   Serratia marcescens NOT DETECTED NOT DETECTED Final   Haemophilus influenzae NOT DETECTED NOT DETECTED Final   Neisseria meningitidis NOT DETECTED NOT DETECTED Final   Pseudomonas aeruginosa NOT DETECTED NOT DETECTED Final   Candida albicans NOT DETECTED NOT DETECTED Final   Candida glabrata NOT DETECTED NOT DETECTED Final   Candida krusei NOT DETECTED NOT DETECTED Final   Candida parapsilosis NOT DETECTED NOT DETECTED Final   Candida tropicalis NOT DETECTED NOT DETECTED Final    Comment: Performed at [facility], [address] [place], [place] [address]  Respiratory Panel by RT PCR (Flu A&B, Covid) - Nasopharyngeal Swab     Status: None   Collection Time: 12/19/19  5:43 PM   Specimen: Nasopharyngeal Swab  Result Value Ref Range Status   SARS Coronavirus 2 by RT PCR NEGATIVE NEGATIVE Final    Comment: (NOTE) SARS-CoV-2 target nucleic acids are NOT DETECTED. The SARS-CoV-2 RNA is generally detectable in upper respiratoy specimens during the acute phase of infection. The lowest concentration of SARS-CoV-2 viral copies this assay can detect is 131 copies/mL. A negative result does not preclude SARS-Cov-2 infection and should not be used as the sole basis for treatment or other patient management decisions. A negative result may occur with  improper specimen collection/handling, submission of specimen other than nasopharyngeal swab, presence of viral mutation(s) within the areas targeted by this assay, and inadequate number of viral copies (<131 copies/mL). A negative result must be combined with clinical observations, patient history, and epidemiological information. The expected result  is Negative. Fact Sheet for Patients:  https://www.moore.com/ Fact Sheet for Healthcare Providers:  https://www.young.biz/ This test is not yet ap proved or cleared by the [place] FDA and  has been authorized for detection and/or diagnosis of SARS-CoV-2 by FDA under an Emergency Use Authorization (EUA). This EUA will remain  in effect (meaning this test can be used) for the duration of the COVID-19 declaration under Section 564(b)(1) of the Act, 21 U.S.C. section 360bbb-3(b)(1), unless the authorization is terminated or revoked sooner.    Influenza A by PCR NEGATIVE NEGATIVE Final   Influenza B by PCR NEGATIVE NEGATIVE Final    Comment: (NOTE) The Xpert Xpress SARS-CoV-2/FLU/RSV assay is intended as an aid in  the diagnosis of influenza from Nasopharyngeal swab specimens and  should not be used as a sole basis for treatment. Nasal washings and  aspirates are unacceptable for Xpert Xpress SARS-CoV-2/FLU/RSV  testing. Fact Sheet for Patients: https://www.moore.com/ Fact Sheet for Healthcare Providers: https://www.young.biz/ This test is not yet approved or cleared by the [place] FDA and  has been authorized for detection and/or diagnosis of SARS-CoV-2 by  FDA under an Emergency Use Authorization (EUA). This EUA will remain  in effect (meaning this test can be used) for the duration of the  Covid-19 declaration under Section 564(b)(1) of the Act, 21  U.S.C. section 360bbb-3(b)(1), unless the authorization is  terminated or revoked. Performed at [facility], [address] [place], [place] [address]   MRSA PCR Screening     Status: None   Collection Time: 12/20/19 12:54 AM  Specimen: Nasal Mucosa; Nasopharyngeal  Result Value Ref Range Status   MRSA by PCR NEGATIVE NEGATIVE Final    Comment:        The GeneXpert MRSA Assay (FDA approved for NASAL specimens only), is one  component of a comprehensive MRSA colonization surveillance program. It is not intended to diagnose MRSA infection nor to guide or monitor treatment for MRSA infections. Performed at [facility], [address] [place], [place] [address]     Acey Lav, MD [facility] [phone] pager  12/20/2019, 1:18 PM
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
## 2019-12-20 NOTE — Progress Notes (Signed)
*  PRELIMINARY RESULTS* Echocardiogram 2D Echocardiogram has been performed.  Michael Lester 12/20/2019, 3:36 PM

## 2019-12-20 NOTE — H&P (Signed)
History and Physical    Michael Lester OIZ:[id number] DOB: 14-Jun-1986 DOA: 12/19/2019  PCP: Patient, [name]   Patient coming from: Home   Chief Complaint: Found unresponsive, neck pain   HPI: Michael Lester is a 34 y.o. male with medical history significant for IV heroin abuse, now presenting to the emergency department after he was found unresponsive and complaining of headache and neck pain.  Patient reports 1 to 2 weeks of pain near the base of his neck that radiates to the right shoulder and has been associated with numbness involving the right second and third fingers.  He has also had recurrent syncopal episodes over the same interval.  He has not noted any fevers or chills and denies cough or shortness of breath.  He does not become lightheaded, experienced chest pain or palpitations, or have any nausea or diaphoresis associated with the syncopal episodes.  He acknowledges ongoing daily IV heroin use but states that he would like to quit.  He has not experienced any chest pain and denies any other joint aches or rash.  [facility] ED Course: Upon arrival to the ED, patient is found to be afebrile, saturating well on room air, tachycardic as high as 160s, and with stable blood pressure.  EKG features sinus tachycardia with rate 144, RSR' in V1 and suspected LVH with anterior ST elevation.  Chest x-ray is negative for acute cardiopulmonary disease.  Chemistry panel features a slight renal insufficiency and glucose of 190.  CBC notable for leukocytosis to 14,400 and mild normocytic anemia.  Lactic acid is reassuringly normal.  CT head is negative for acute intracranial abnormality and CT cervical spine is negative for acute fracture but features an abnormal lucency at the adjacent endplates of C6-7.  This was followed by MRI cervical spine which is concerning for discitis osteomyelitis at C6-7 with prevertebral edema and phlegmon resulting in spinal stenosis and cord mass-effect without definite cord  signal abnormality and without drainable fluid collection.  Blood cultures were collected in the emergency department, 2 L of lactated Ringer's were administered, and the patient was treated with cefepime, vancomycin, and Flagyl.  Neurosurgery at [facility] was consulted by the ED physician, did not anticipate any urgent surgical intervention but recommended admission to the hospitalist service at [facility] for ongoing treatment. COVID-19 PCR was negative in the ED.   Review of Systems:  All other systems reviewed and apart from HPI, are negative.  History reviewed. No pertinent past medical history.  Past Surgical History:  Procedure Laterality Date  . LACERATION REPAIR N/A 11/16/2014   Procedure: REPAIR MULTIPLE LACERATIONS;  Surgeon: Melvenia Beam, MD;  Location: [facility] OR;  Service: ENT;  Laterality: N/A;  . LACERATION REPAIR Left 11/16/2014   Procedure: REPAIR MULTIPLE LACERATIONS;  Surgeon: Manus Rudd, MD;  Location: [facility] OR;  Service: General;  Laterality: Left;  irrigation and suturing of lacerations on upper body     reports that he has been smoking. He has been smoking about 0.50 packs per day. He has never used smokeless tobacco. He reports previous alcohol use. He reports current drug use. Drugs: Marijuana and Heroin.  Allergies  Allergen Reactions  . Shellfish Allergy Anaphylaxis    History reviewed. No pertinent family history.   Prior to Admission medications   Medication Sig Start Date End Date Taking? Authorizing Provider  azithromycin (ZITHROMAX Z-PAK) 250 MG tablet Dispense 1 Z-Pak as directed 12/06/19   Emily Filbert, MD  clotrimazole (LOTRIMIN) 1 %  external solution Apply 1 application topically 2 (two) times daily. 01/13/17   Willy Eddy, MD  oxyCODONE-acetaminophen (ROXICET) 5-325 MG per tablet Take 1-2 tablets by mouth every 4 (four) hours as needed (Pain). 11/17/14   Freeman Caldron, PA-C  sulfamethoxazole-trimethoprim (BACTRIM  DS,SEPTRA DS) 800-160 MG tablet Take 1 tablet by mouth 2 (two) times daily. 11/20/18   Myrna Blazer, MD    Physical Exam: Vitals:   12/20/19 0051  BP: 123/83  Pulse: 60  Resp: 10  Temp: 98.3 F (36.8 C)  TempSrc: Oral  SpO2: 97%     Constitutional: NAD, calm  Eyes: PERTLA, lids and conjunctivae normal ENMT: Mucous membranes are moist. Posterior pharynx clear of any exudate or lesions.   Neck: normal, supple, no masses, no thyromegaly Respiratory: clear to auscultation bilaterally, no wheezing, no crackles. No accessory muscle use.  Cardiovascular: S1 & S2 heard, regular rate and rhythm. No extremity edema.   Abdomen: No distension, no tenderness, soft. Bowel sounds active.  Musculoskeletal: no clubbing / cyanosis. No joint deformity upper and lower extremities.   Skin: no significant rashes, lesions, ulcers. Warm, dry, well-perfused. Neurologic: CN 2-12 grossly intact. Sensation to light touch diminished in right 2nd and 3rd fingers. Strength 5/5 in all 4 limbs.  Psychiatric: Alert and oriented to person, place, and situation. Pleasant and cooperative.    Labs and Imaging on Admission: I have personally reviewed following labs and imaging studies  CBC: Recent Labs  Lab 12/19/19 1515  WBC 14.4*  NEUTROABS 12.6*  HGB 11.3*  HCT 35.1*  MCV 81.6  PLT 325   Basic Metabolic Panel: Recent Labs  Lab 12/19/19 1515  NA 138  K 4.7  CL 105  CO2 24  GLUCOSE 195*  BUN 23*  CREATININE 1.19  CALCIUM 9.0   GFR: Estimated Creatinine Clearance: 83.3 mL/min (by C-G formula based on SCr of 1.19 mg/dL). Liver Function Tests: Recent Labs  Lab 12/19/19 1515  AST 17  ALT 15  ALKPHOS 112  BILITOT 0.2*  PROT 8.4*  ALBUMIN 3.6   No results for input(s): LIPASE, AMYLASE in the last 168 hours. No results for input(s): AMMONIA in the last 168 hours. Coagulation Profile: Recent Labs  Lab 12/19/19 1743  INR 1.2   Cardiac Enzymes: Recent Labs  Lab 12/19/19 1515   CKTOTAL 86   BNP (last 3 results) No results for input(s): PROBNP in the last 8760 hours. HbA1C: No results for input(s): HGBA1C in the last 72 hours. CBG: No results for input(s): GLUCAP in the last 168 hours. Lipid Profile: No results for input(s): CHOL, HDL, LDLCALC, TRIG, CHOLHDL, LDLDIRECT in the last 72 hours. Thyroid Function Tests: No results for input(s): TSH, T4TOTAL, FREET4, T3FREE, THYROIDAB in the last 72 hours. Anemia Panel: No results for input(s): VITAMINB12, FOLATE, FERRITIN, TIBC, IRON, RETICCTPCT in the last 72 hours. Urine analysis: No results found for: COLORURINE, APPEARANCEUR, LABSPEC, PHURINE, GLUCOSEU, HGBUR, BILIRUBINUR, KETONESUR, PROTEINUR, UROBILINOGEN, NITRITE, LEUKOCYTESUR Sepsis Labs: @LABRCNTIP (procalcitonin:4,lacticidven:4) ) Recent Results (from the past 240 hour(s))  Respiratory Panel by RT PCR (Flu A&B, Covid) - Nasopharyngeal Swab     Status: None   Collection Time: 12/19/19  5:43 PM   Specimen: Nasopharyngeal Swab  Result Value Ref Range Status   SARS Coronavirus 2 by RT PCR NEGATIVE NEGATIVE Final    Comment: (NOTE) SARS-CoV-2 target nucleic acids are NOT DETECTED. The SARS-CoV-2 RNA is generally detectable in upper respiratoy specimens during the acute phase of infection. The lowest concentration of SARS-CoV-2 viral  copies this assay can detect is 131 copies/mL. A negative result does not preclude SARS-Cov-2 infection and should not be used as the sole basis for treatment or other patient management decisions. A negative result may occur with  improper specimen collection/handling, submission of specimen other than nasopharyngeal swab, presence of viral mutation(s) within the areas targeted by this assay, and inadequate number of viral copies (<131 copies/mL). A negative result must be combined with clinical observations, patient history, and epidemiological information. The expected result is Negative. Fact Sheet for Patients:    https://www.moore.com/ Fact Sheet for Healthcare Providers:  https://www.young.biz/ This test is not yet ap proved or cleared by the [place] FDA and  has been authorized for detection and/or diagnosis of SARS-CoV-2 by FDA under an Emergency Use Authorization (EUA). This EUA will remain  in effect (meaning this test can be used) for the duration of the COVID-19 declaration under Section 564(b)(1) of the Act, 21 U.S.C. section 360bbb-3(b)(1), unless the authorization is terminated or revoked sooner.    Influenza A by PCR NEGATIVE NEGATIVE Final   Influenza B by PCR NEGATIVE NEGATIVE Final    Comment: (NOTE) The Xpert Xpress SARS-CoV-2/FLU/RSV assay is intended as an aid in  the diagnosis of influenza from Nasopharyngeal swab specimens and  should not be used as a sole basis for treatment. Nasal washings and  aspirates are unacceptable for Xpert Xpress SARS-CoV-2/FLU/RSV  testing. Fact Sheet for Patients: https://www.moore.com/ Fact Sheet for Healthcare Providers: https://www.young.biz/ This test is not yet approved or cleared by the [place] FDA and  has been authorized for detection and/or diagnosis of SARS-CoV-2 by  FDA under an Emergency Use Authorization (EUA). This EUA will remain  in effect (meaning this test can be used) for the duration of the  Covid-19 declaration under Section 564(b)(1) of the Act, 21  U.S.C. section 360bbb-3(b)(1), unless the authorization is  terminated or revoked. Performed at [facility], [address] [place], [place] [address]      Radiological Exams on Admission: CT Head Wo Contrast  Result Date: 12/19/2019 CLINICAL DATA:  Found down EXAM: CT HEAD WITHOUT CONTRAST CT CERVICAL SPINE WITHOUT CONTRAST TECHNIQUE: Multidetector CT imaging of the head and cervical spine was performed following the standard protocol without intravenous contrast.  Multiplanar CT image reconstructions of the cervical spine were also generated. COMPARISON:  None. FINDINGS: CT HEAD FINDINGS Brain: There is no mass, hemorrhage or extra-axial collection. The size and configuration of the ventricles and extra-axial CSF spaces are normal. The brain parenchyma is normal, without evidence of acute or chronic infarction. Vascular: No abnormal hyperdensity of the major intracranial arteries or dural venous sinuses. No intracranial atherosclerosis. Skull: The visualized skull base, calvarium and extracranial soft tissues are normal. Sinuses/Orbits: No fluid levels or advanced mucosal thickening of the visualized paranasal sinuses. No mastoid or middle ear effusion. The orbits are normal. CT CERVICAL SPINE FINDINGS Alignment: Grade 1 retrolisthesis at C6-7. Skull base and vertebrae: There is abnormal lucency at the adjacent endplates of C6-7. no fracture. Soft tissues and spinal canal: No prevertebral fluid or swelling. No visible canal hematoma. Disc levels: No advanced spinal canal or neural foraminal stenosis. Upper chest: No pneumothorax, pulmonary nodule or pleural effusion. Other: Normal visualized paraspinal cervical soft tissues. IMPRESSION: 1. No acute intracranial abnormality. 2. No acute fracture of the cervical spine. 3. Abnormal lucency at the adjacent endplates of C6-7. This may be a manifestation of degenerative disc disease, but the appearance is more concerning for discitis osteomyelitis  in a patient with history of IV drug use. MRI of the cervical spine with and without contrast is recommended for further characterization. Electronically Signed   By: Deatra Robinson M.D.   On: 12/19/2019 16:42   CT Cervical Spine Wo Contrast  Result Date: 12/19/2019 CLINICAL DATA:  Found down EXAM: CT HEAD WITHOUT CONTRAST CT CERVICAL SPINE WITHOUT CONTRAST TECHNIQUE: Multidetector CT imaging of the head and cervical spine was performed following the standard protocol without  intravenous contrast. Multiplanar CT image reconstructions of the cervical spine were also generated. COMPARISON:  None. FINDINGS: CT HEAD FINDINGS Brain: There is no mass, hemorrhage or extra-axial collection. The size and configuration of the ventricles and extra-axial CSF spaces are normal. The brain parenchyma is normal, without evidence of acute or chronic infarction. Vascular: No abnormal hyperdensity of the major intracranial arteries or dural venous sinuses. No intracranial atherosclerosis. Skull: The visualized skull base, calvarium and extracranial soft tissues are normal. Sinuses/Orbits: No fluid levels or advanced mucosal thickening of the visualized paranasal sinuses. No mastoid or middle ear effusion. The orbits are normal. CT CERVICAL SPINE FINDINGS Alignment: Grade 1 retrolisthesis at C6-7. Skull base and vertebrae: There is abnormal lucency at the adjacent endplates of C6-7. no fracture. Soft tissues and spinal canal: No prevertebral fluid or swelling. No visible canal hematoma. Disc levels: No advanced spinal canal or neural foraminal stenosis. Upper chest: No pneumothorax, pulmonary nodule or pleural effusion. Other: Normal visualized paraspinal cervical soft tissues. IMPRESSION: 1. No acute intracranial abnormality. 2. No acute fracture of the cervical spine. 3. Abnormal lucency at the adjacent endplates of C6-7. This may be a manifestation of degenerative disc disease, but the appearance is more concerning for discitis osteomyelitis in a patient with history of IV drug use. MRI of the cervical spine with and without contrast is recommended for further characterization. Electronically Signed   By: Deatra Robinson M.D.   On: 12/19/2019 16:42   MR Cervical Spine W or Wo Contrast  Result Date: 12/19/2019 CLINICAL DATA:  34 year old male found down. Cervical spine CT earlier today suspicious for discitis osteomyelitis at C6-C7. EXAM: MRI CERVICAL SPINE WITHOUT AND WITH CONTRAST TECHNIQUE:  Multiplanar and multiecho pulse sequences of the cervical spine, to include the craniocervical junction and cervicothoracic junction, were obtained without and with intravenous contrast. CONTRAST:  75mL GADAVIST GADOBUTROL 1 MMOL/ML IV SOLN COMPARISON:  CT cervical spine 1622 hours today. FINDINGS: Alignment: Stable straightening of cervical lordosis with mild retrolisthesis at C6-C7. Vertebrae: Confluent abnormal marrow edema and enhancement in the C6 and C7 vertebral bodies. Mild right side pedicle edema also. Loss of the intervening disc space which is mostly low signal on MRI, however, there is abundant prevertebral and longus coli muscle edema emanating from that level. Superimposed retropharyngeal effusion. Abnormal thickened and enhancing soft tissue in the ventral right epidural space (series 14 image 7 and series 15, image 24). No drainable fluid collection identified. Inflammation appears to extend into the right C7 neural foramen. No superimposed ventral dural thickening, although there does appear to be mild dorsal dural thickening extending cephalad from that level. The C5 and the T1 levels are not inflamed. There is chronic C5-C6 disc and endplate degeneration. Cord: Spinal stenosis and spinal cord mass effect at C5-C6 (degenerative) and C6-C7 (inflammatory) but no definite abnormal cord signal. No abnormal intradural enhancement. Posterior Fossa, vertebral arteries, paraspinal tissues: Cervicomedullary junction is within normal limits. Negative visible posterior fossa. Preserved major vascular flow voids in the neck. Abnormal prevertebral soft tissues maximal  at C6-C7 as detailed above. Pronounced bilateral longus coli muscle edema at C6 and C7. No drainable fluid collection. Disc levels: Abnormal C6-C7 level detailed above. Superimposed chronic appearing disc and endplate degeneration at C5-C6 with right eccentric disc osteophyte complex. Mild spinal stenosis, mild spinal cord mass effect, mild to  moderate left and severe right C6 foraminal stenosis. IMPRESSION: 1. Positive for Discitis Osteomyelitis at C6-C7. Mild retrolisthesis at that level with prevertebral fluid and edema, and phlegmon in the ventral epidural space resulting in spinal stenosis and spinal cord mass effect. But spinal cord signal abnormality. And no drainable fluid collection. 2. Superimposed chronic C5-C6 degeneration with degenerative spinal and severe right foraminal stenosis. Electronically Signed   By: Genevie Ann M.D.   On: 12/19/2019 19:39   DG Chest Portable 1 View  Result Date: 12/19/2019 CLINICAL DATA:  Altered mental status, lethargy EXAM: PORTABLE CHEST 1 VIEW COMPARISON:  12/06/2019 FINDINGS: The heart size and mediastinal contours are within normal limits. Both lungs are clear. The visualized skeletal structures are unremarkable. IMPRESSION: No active disease. Electronically Signed   By: Jerilynn Mages.  Shick M.D.   On: 12/19/2019 18:05    EKG: Independently reviewed. Sinus tachycardia (rate 144), suspected LVH with anterior ST elevation.   Assessment/Plan   1. 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  - Presents after a syncopal episodes, reports 1-2 weeks of severe neck pain with radiation to right shoulder and right 2nd and 3rd finger numbness  - MRI concerning for 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  - Neurosurgery consulted by ED physician and did not anticipate urgent surgery but recommended admission to [facility] for ongoing treatment  - Blood cultures were collected in ED and empiric vancomycin, cefepime, and Flagyl were administered  - Continue empiric coverage with vancomycin and ceftriaxone, follow cultures and clinical response to treatment    2. Recurrent syncope    - Pt reports several syncopal episodes in the past 2 wks without prodrome  - EKG is abnormal  - Continue cardiac monitoring and check echocardiogram    3. IV drug abuse - Ongoing daily IV heroin use, reports interest in quitting  - Monitor for withdrawal, consult SW  for possible resources    DVT prophylaxis: SCD's  Code Status: Full  Family Communication: Discussed with patient   Disposition Plan: Will likely return home once cleared for discharge by neurosurgery. If he requires long course of IV antibiotics but otherwise stable for hospital discharge, will likely require SNF rather than home health for this given hx of IVDA.  Consults called: Neurosurgery  Admission status: Inapient     Vianne Bulls, MD [facility] Pager: See [email or web address]  If 7AM-7PM, please contact the daytime attending [email or web address]  12/20/2019, 1:02 AM

## 2019-12-20 NOTE — Progress Notes (Signed)
PHARMACY - PHYSICIAN COMMUNICATION CRITICAL VALUE ALERT - BLOOD CULTURE IDENTIFICATION (BCID)  Michael Lester is an 34 y.o. male who presented to [facility] on 12/19/2019 with a chief complaint of unresponsiveness > lethargy after heroin use.  Assessment:  Exam/CT concerning for 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; now 1/4 blood cx bottles growing GPC, awaiting C/S.  Name of physician (or Provider) Contacted: JSegal  Current antibiotics: vancomycin and ceftriaxone  Changes to prescribed antibiotics recommended:  No changes for now, f/u C/S.   Vernard Gambles, PharmD, BCPS  12/20/2019  7:34 AM

## 2019-12-21 ENCOUNTER — Other Ambulatory Visit: Payer: Self-pay | Admitting: Neurosurgery

## 2019-12-21 DIAGNOSIS — B182 Chronic viral hepatitis C: Secondary | ICD-10-CM | POA: Diagnosis present

## 2019-12-21 DIAGNOSIS — F1120 Opioid dependence, uncomplicated: Secondary | ICD-10-CM | POA: Diagnosis present

## 2019-12-21 DIAGNOSIS — G061 Intraspinal abscess and granuloma: Secondary | ICD-10-CM | POA: Diagnosis present

## 2019-12-21 LAB — SEDIMENTATION RATE: Sed Rate: 35 mm/hr — ABNORMAL HIGH (ref 0–16)

## 2019-12-21 LAB — CBC
HCT: 33.3 % — ABNORMAL LOW (ref 39.0–52.0)
Hemoglobin: 11.1 g/dL — ABNORMAL LOW (ref 13.0–17.0)
MCH: 26.4 pg (ref 26.0–34.0)
MCHC: 33.3 g/dL (ref 30.0–36.0)
MCV: 79.3 fL — ABNORMAL LOW (ref 80.0–100.0)
Platelets: 274 10*3/uL (ref 150–400)
RBC: 4.2 MIL/uL — ABNORMAL LOW (ref 4.22–5.81)
RDW: 13.5 % (ref 11.5–15.5)
WBC: 5.7 10*3/uL (ref 4.0–10.5)
nRBC: 0 % (ref 0.0–0.2)

## 2019-12-21 LAB — FERRITIN: Ferritin: 72 ng/mL (ref 24–336)

## 2019-12-21 LAB — BASIC METABOLIC PANEL
Anion gap: 11 (ref 5–15)
BUN: 12 mg/dL (ref 6–20)
CO2: 24 mmol/L (ref 22–32)
Calcium: 8.6 mg/dL — ABNORMAL LOW (ref 8.9–10.3)
Chloride: 102 mmol/L (ref 98–111)
Creatinine, Ser: 0.95 mg/dL (ref 0.61–1.24)
GFR calc Af Amer: >60 mL/min (ref >60)
GFR calc non Af Amer: >60 mL/min (ref >60)
Glucose, Bld: 125 mg/dL — ABNORMAL HIGH (ref 70–99)
Potassium: 3.6 mmol/L (ref 3.5–5.1)
Sodium: 137 mmol/L (ref 135–145)

## 2019-12-21 LAB — VANCOMYCIN, TROUGH: Vancomycin Tr: 13 ug/mL — ABNORMAL LOW (ref 15–20)

## 2019-12-21 LAB — IRON AND TIBC
Iron: 38 ug/dL — ABNORMAL LOW (ref 45–182)
Saturation Ratios: 11 % — ABNORMAL LOW (ref 17.9–39.5)
TIBC: 342 ug/dL (ref 250–450)
UIBC: 304 ug/dL

## 2019-12-21 LAB — HIV ANTIBODY (ROUTINE TESTING W REFLEX): HIV Screen 4th Generation wRfx: NONREACTIVE

## 2019-12-21 LAB — HEPATITIS A ANTIBODY, TOTAL: hep A Total Ab: NONREACTIVE

## 2019-12-21 LAB — HEPATITIS C ANTIBODY: HCV Ab: REACTIVE — AB

## 2019-12-21 LAB — VANCOMYCIN, PEAK: Vancomycin Pk: 38 ug/mL (ref 30–40)

## 2019-12-21 LAB — MAGNESIUM: Magnesium: 2 mg/dL (ref 1.7–2.4)

## 2019-12-21 LAB — C-REACTIVE PROTEIN: CRP: 2.8 mg/dL — ABNORMAL HIGH (ref <1.0)

## 2019-12-21 MED ORDER — VANCOMYCIN HCL 1250 MG/250ML IV SOLN
1250.0000 mg | Freq: Two times a day (BID) | INTRAVENOUS | Status: DC
  Administered 2019-12-22 – 2019-12-30 (×17): 1250 mg via INTRAVENOUS
  Filled 2019-12-21 (×18): qty 250

## 2019-12-21 NOTE — Progress Notes (Signed)
[facility]  Date of Admission:  12/19/2019      Total days of antibiotics 3  Day 3 vancomycin + Ceftriaxone           ASSESSMENT: Michael Lester is a 34 y.o. male with subacute osteomyelitis involving C6-7 developing over the last 3 weeks and now with MRI indicating compressive epidural abscess at C7 nerve root. He has been on empiric Vancomycin + Ceftriaxone now x 3 days; blood cultures are 1/4 bottles coagulase negative staph species likely reflecting a skin contaminant. Dr. Trenton Gammon is planning surgery with anterior approach tomorrow for debulking of abscess and stabilization. Planning intraoperative cultures which will hopefully yield an organism during treatment. Discussed typical course of treatment 6-8 weeks with IV course. Baseline CRP 2.8, ESR 35.   Will continue Vancomycin + Ceftriaxone for now and follow micro data from surgery tomorrow.   Substance Use Disorder with Injection - (+) Hep C Ab test. NR HIV. Will check Hep C RNA quantitative level and Hep B Surface Ag.   PLAN: 1. Continue vancomycin and ceftriaxone 2. Follow Hep C Ab (+) with RNA to determine chronic infection for outpatient management.  3. Follow Hep B sAg and Ab 4. Would benefit from Hep A vaccines  5. Hold on PICC line for now    Principal Problem:   Osteomyelitis of C6-7 Active Problems:   Abscess in epidural space of cervical spine   Injection of illicit drug within last 12 months   Syncope   Opioid dependence, daily use (HCC)   Positive hepatitis C antibody test   . enoxaparin (LOVENOX) injection  40 mg Subcutaneous Q24H  . gabapentin  100 mg Oral TID  . nicotine  14 mg Transdermal Daily  . sodium chloride flush  3 mL Intravenous Q12H    SUBJECTIVE: He is tearful upon entering the room with the severe, constant pain. He has had right hand/finger numbness for 2-3 weeks now during the course of time he has experienced worsening and now severe neck pain at  C6-7.   He has not had any problems with progressive radiculopathy since hospital admission. Dr. Trenton Gammon has seen and planning for anterior cervical discectomy with interbody fusion with autograft and hardware C5-C6 and C6-C7.   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 Wife is a Glass blower/designer company.    Review of Systems: Review of Systems  Constitutional: Negative for chills, fever, malaise/fatigue and weight loss.  HENT: Negative for sore throat.        No dental problems  Respiratory: Negative for cough and sputum production.   Cardiovascular: Negative for chest pain and leg swelling.  Gastrointestinal: Negative for abdominal pain, diarrhea and vomiting.  Genitourinary: Negative for dysuria and flank pain.  Musculoskeletal: Positive for neck pain. Negative for joint pain and myalgias.  Skin: Negative for rash.  Neurological: Positive for tingling and weakness. Negative for dizziness and headaches.  Psychiatric/Behavioral: Negative for depression and substance abuse. The patient is not nervous/anxious and does not have insomnia.     Allergies  Allergen Reactions  . Shellfish Allergy Anaphylaxis    OBJECTIVE: Vitals:   12/20/19 1927 12/21/19 0000 12/21/19 0400 12/21/19 0806  BP: 125/68 115/73 130/78 129/79  Pulse:    78  Resp:    15  Temp: 98.5 F (36.9 C) 98 F (36.7 C) 98.4 F (36.9 C) 98.2 F (36.8 C)  TempSrc: Oral Oral  Oral Oral  SpO2:    98%  Weight:      Height:       Body mass index is 20.8 kg/m.  Physical Exam Constitutional:      Appearance: He is well-developed.     Comments: Appears to be in significant pain trying to rest in bed.   HENT:     Mouth/Throat:     Mouth: Mucous membranes are moist.     Dentition: Normal dentition. No dental abscesses.     Pharynx: Oropharynx is clear.     Comments: Poor dentition with multiple carries noted.  Cardiovascular:     Rate and Rhythm: Normal rate and regular  rhythm.     Heart sounds: Normal heart sounds.  Pulmonary:     Effort: Pulmonary effort is normal.     Breath sounds: Normal breath sounds.  Abdominal:     General: There is no distension.     Palpations: Abdomen is soft.     Tenderness: There is no abdominal tenderness.  Lymphadenopathy:     Cervical: No cervical adenopathy.  Skin:    General: Skin is warm and dry.     Findings: No rash.  Neurological:     Mental Status: He is alert and oriented to person, place, and time.  Psychiatric:        Judgment: Judgment normal.     Comments: Tearful in bed.      Lab Results Lab Results  Component Value Date   WBC 5.7 12/21/2019   HGB 11.1 (L) 12/21/2019   HCT 33.3 (L) 12/21/2019   MCV 79.3 (L) 12/21/2019   PLT 274 12/21/2019    Lab Results  Component Value Date   CREATININE 0.95 12/21/2019   BUN 12 12/21/2019   NA 137 12/21/2019   K 3.6 12/21/2019   CL 102 12/21/2019   CO2 24 12/21/2019    Lab Results  Component Value Date   ALT 13 12/20/2019   AST 10 (L) 12/20/2019   ALKPHOS 89 12/20/2019   BILITOT 0.6 12/20/2019     Microbiology: Recent Results (from the past 240 hour(s))  Culture, blood (routine x 2)     Status: None (Preliminary result)   Collection Time: 12/19/19  3:41 PM   Specimen: BLOOD  Result Value Ref Range Status   Specimen Description BLOOD  R FOREARM  Final   Special Requests   Final    BOTTLES DRAWN AEROBIC AND ANAEROBIC Blood Culture adequate volume   Culture   Final    NO GROWTH 2 DAYS Performed at [facility], [address] [place], [place] [address]    Report Status PENDING  Incomplete  Culture, blood (routine x 2)     Status: Abnormal   Collection Time: 12/19/19  3:41 PM   Specimen: BLOOD  Result Value Ref Range Status   Specimen Description   Final    BLOOD  L FOREARM Performed at [facility], [address], [place], [place] [address]    Special Requests   Final    BOTTLES DRAWN AEROBIC AND ANAEROBIC Blood  Culture adequate volume Performed at [facility], [address] [place], [place] [address]    Culture  Setup Time   Final    GRAM POSITIVE COCCI IN BOTH AEROBIC AND ANAEROBIC BOTTLES CRITICAL RESULT CALLED TO, READ BACK BY AND VERIFIED WITH: JEENA PIGG AND V BRYAK AT 0719 ON 12/20/19 SNG    Culture (A)  Final    STAPHYLOCOCCUS  SPECIES (COAGULASE NEGATIVE) THE SIGNIFICANCE OF ISOLATING THIS ORGANISM FROM A SINGLE SET OF BLOOD CULTURES WHEN MULTIPLE SETS ARE DRAWN IS UNCERTAIN. PLEASE NOTIFY THE MICROBIOLOGY DEPARTMENT WITHIN ONE WEEK IF SPECIATION AND SENSITIVITIES ARE REQUIRED. Performed at [facility], [place] [address] [place], [place] [address]    Report Status 12/21/2019 FINAL  Final  Blood Culture ID Panel (Reflexed)     Status: Abnormal   Collection Time: 12/19/19  3:41 PM  Result Value Ref Range Status   Enterococcus species NOT DETECTED NOT DETECTED Final   Listeria monocytogenes NOT DETECTED NOT DETECTED Final   Staphylococcus species DETECTED (A) NOT DETECTED Final    Comment: Methicillin (oxacillin) resistant coagulase negative staphylococcus. Possible blood culture contaminant (unless isolated from more than one blood culture draw or clinical case suggests pathogenicity). No antibiotic treatment is indicated for blood  culture contaminants. CRITICAL RESULT CALLED TO, READ BACK BY AND VERIFIED WITH: KAREN PATTON AT 0944 ON 12/20/19 SNG    Staphylococcus aureus (BCID) NOT DETECTED NOT DETECTED Final   Methicillin resistance DETECTED (A) NOT DETECTED Final    Comment: CRITICAL RESULT CALLED TO, READ BACK BY AND VERIFIED WITH: KAREN PATTON AT 0944 ON 12/20/19 SNG    Streptococcus species NOT DETECTED NOT DETECTED Final   Streptococcus agalactiae NOT DETECTED NOT DETECTED Final   Streptococcus pneumoniae NOT DETECTED NOT DETECTED Final   Streptococcus pyogenes NOT DETECTED NOT DETECTED Final   Acinetobacter baumannii NOT DETECTED NOT DETECTED Final    Enterobacteriaceae species NOT DETECTED NOT DETECTED Final   Enterobacter cloacae complex NOT DETECTED NOT DETECTED Final   Escherichia coli NOT DETECTED NOT DETECTED Final   Klebsiella oxytoca NOT DETECTED NOT DETECTED Final   Klebsiella pneumoniae NOT DETECTED NOT DETECTED Final   Proteus species NOT DETECTED NOT DETECTED Final   Serratia marcescens NOT DETECTED NOT DETECTED Final   Haemophilus influenzae NOT DETECTED NOT DETECTED Final   Neisseria meningitidis NOT DETECTED NOT DETECTED Final   Pseudomonas aeruginosa NOT DETECTED NOT DETECTED Final   Candida albicans NOT DETECTED NOT DETECTED Final   Candida glabrata NOT DETECTED NOT DETECTED Final   Candida krusei NOT DETECTED NOT DETECTED Final   Candida parapsilosis NOT DETECTED NOT DETECTED Final   Candida tropicalis NOT DETECTED NOT DETECTED Final    Comment: Performed at [facility], [address] [place],  [address]  Respiratory Panel by RT PCR (Flu A&B, Covid) - Nasopharyngeal Swab     Status: None   Collection Time: 12/19/19  5:43 PM   Specimen: Nasopharyngeal Swab  Result Value Ref Range Status   SARS Coronavirus 2 by RT PCR NEGATIVE NEGATIVE Final    Comment: (NOTE) SARS-CoV-2 target nucleic acids are NOT DETECTED. The SARS-CoV-2 RNA is generally detectable in upper respiratoy specimens during the acute phase of infection. The lowest concentration of SARS-CoV-2 viral copies this assay can detect is 131 copies/mL. A negative result does not preclude SARS-Cov-2 infection and should not be used as the sole basis for treatment or other patient management decisions. A negative result may occur with  improper specimen collection/handling, submission of specimen other than nasopharyngeal swab, presence of viral mutation(s) within the areas targeted by this assay, and inadequate number of viral copies (<131 copies/mL). A negative result must be combined with clinical observations, patient history, and  epidemiological information. The expected result is Negative. Fact Sheet for Patients:  PinkCheek.be Fact Sheet for Healthcare Providers:  GravelBags.it This test is not yet ap proved or cleared by  the [employer] and  has been authorized for detection and/or diagnosis of SARS-CoV-2 by FDA under an Emergency Use Authorization (EUA). This EUA will remain  in effect (meaning this test can be used) for the duration of the COVID-19 declaration under Section 564(b)(1) of the Act, 21 U.S.C. section 360bbb-3(b)(1), unless the authorization is terminated or revoked sooner.    Influenza A by PCR NEGATIVE NEGATIVE Final   Influenza B by PCR NEGATIVE NEGATIVE Final    Comment: (NOTE) The Xpert Xpress SARS-CoV-2/FLU/RSV assay is intended as an aid in  the diagnosis of influenza from Nasopharyngeal swab specimens and  should not be used as a sole basis for treatment. Nasal washings and  aspirates are unacceptable for Xpert Xpress SARS-CoV-2/FLU/RSV  testing. Fact Sheet for Patients: PinkCheek.be Fact Sheet for Healthcare Providers: GravelBags.it This test is not yet approved or cleared by the [place] FDA and  has been authorized for detection and/or diagnosis of SARS-CoV-2 by  FDA under an Emergency Use Authorization (EUA). This EUA will remain  in effect (meaning this test can be used) for the duration of the  Covid-19 declaration under Section 564(b)(1) of the Act, 21  U.S.C. section 360bbb-3(b)(1), unless the authorization is  terminated or revoked. Performed at [facility], [address] [place], [place] [address]   MRSA PCR Screening     Status: None   Collection Time: 12/20/19 12:54 AM   Specimen: Nasal Mucosa; Nasopharyngeal  Result Value Ref Range Status   MRSA by PCR NEGATIVE NEGATIVE Final    Comment:        The GeneXpert MRSA Assay (FDA  approved for NASAL specimens only), is one component of a comprehensive MRSA colonization surveillance program. It is not intended to diagnose MRSA infection nor to guide or monitor treatment for MRSA infections. Performed at [facility], [place] [address] [place], [place] [address]   Culture, blood (Routine X 2) w Reflex to ID Panel     Status: None (Preliminary result)   Collection Time: 12/20/19  1:07 PM   Specimen: BLOOD LEFT FOREARM  Result Value Ref Range Status   Specimen Description BLOOD LEFT FOREARM  Final   Special Requests   Final    BOTTLES DRAWN AEROBIC AND ANAEROBIC Blood Culture adequate volume   Culture   Final    NO GROWTH < 24 HOURS Performed at [facility], [place] [address] [place], [place] [address]    Report Status PENDING  Incomplete  Culture, blood (Routine X 2) w Reflex to ID Panel     Status: None (Preliminary result)   Collection Time: 12/20/19  1:16 PM   Specimen: BLOOD LEFT FOREARM  Result Value Ref Range Status   Specimen Description BLOOD LEFT FOREARM  Final   Special Requests   Final    BOTTLES DRAWN AEROBIC AND ANAEROBIC Blood Culture adequate volume   Culture   Final    NO GROWTH < 24 HOURS Performed at [facility], [place] [address], [place],  [address]    Report Status PENDING  Incomplete     Janene Madeira, MSN, NP-C [facility] [email or web address] Pager: [phone] Office: [phone] [place]: [phone]

## 2019-12-21 NOTE — Progress Notes (Signed)
PROGRESS NOTE    Michael Lester    Code Status: Full Code  ZOX:[id number] DOB: [birth year] DOA: 12/19/2019 LOS: 2 days  PCP: Patient, [name] CC: No chief complaint on file.      Hospital Summary  Michael Lester is a 34 y.o. male with a history of IV drug use who initially presented to [facility] after being found unresponsive and complaining of headache and neck pain x2 weeks at the base of his skull radiating to right shoulder with associated paresthesias of right first, second and third fingers as well as recurrent syncopal episodes over the same interval.  At [facility], he was found to be afebrile, saturating well on room air, tachycardic as high as 160s, and with stable blood pressure.    Sinus tach on EKG, RSR' in V1 and suspected LVH with anterior ST elevation.  Chest x-ray is negative for acute cardiopulmonary disease.  Cr 1.37, and glucose of 190.    WBC 14,400 and mild normocytic anemia.  Lactic acid is reassuringly normal.  CT head is negative for acute intracranial abnormality and CT cervical spine is negative for acute fracture but features an abnormal lucency at the adjacent endplates of C6-7.  This was followed by MRI cervical spine which is concerning for discitis osteomyelitis at C6-7 with prevertebral edema and phlegmon resulting in spinal stenosis and cord mass-effect without definite cord signal abnormality and without drainable fluid collection. 2 L of lactated Ringer's were administered, and the patient was treated with cefepime, vancomycin, and Flagyl.  Neurosurgery at [facility] was consulted by the ED physician, did not anticipate any urgent surgical intervention but recommended admission to the hospitalist service at [facility] for ongoing treatment.   Blood cultures positive for 1 of 2 methicillin-resistant coag negative staph likely contaminant,.  ID consulted.  Neurosurgery consulted as well.   A & P   Principal Problem:   Septic discitis of cervical  region Active Problems:   IV drug abuse (HCC)   Syncope   1. 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 Blood culture positive for 1 of 2 methicillin-resistant coag negative staph, concern for contaminant b. ID consulted: Vancomycin/ceftriaxone for now.  Plan for 6 to 8 weeks IV antibiotics ideally. c. Echo unremarkable d. Neurosurgery: Plan for OR in a.m.  Will make n.p.o. after midnight 2. Methicillin-resistant coag negative staph bacteremia, likely contaminant a. Follow-up repeat blood cultures and ID recommendations 3. Recurrent syncope, unknown etiology likely related to above a. Sinus tachycardia on EKG and likely LVH, not concerning for STEMI on personal read b. Echo unremarkable c. PT/OT once able to 4. Hypomagnesemia  a. Follow-up 5. Normocytic anemia, likely ACD a. Hb stable at 11 6. IV drug use, daily heroin use a. Plans to stop using.  Glad he is in the hospital to help with his detox b. Patient denies withdrawal symptoms at this point c. SW on board  DVT prophylaxis: SCDs, Lovenox Family Communication: Patient updated at bedside Disposition Plan:   Patient came from:   Home  Anticipated d/c place: TBD  Barriers to d/c: Medical stability  Pressure injury documentation    None  Consultants  Infectious disease Neurosurgery  Procedures  None  Antibiotics   Anti-infectives (From admission, onward)   Start     Dose/Rate Route Frequency Ordered Stop   12/20/19 0600  vancomycin (VANCOREADY) IVPB 1750 mg/350 mL     1,750 mg 175 mL/hr over 120 Minutes Intravenous Every 12 hours 12/20/19 0042     12/20/19 0030  cefTRIAXone (ROCEPHIN) 2 g in sodium chloride 0.9 % 100 mL IVPB     2 g 200 mL/hr over 30 Minutes Intravenous Every 24 hours 12/20/19 0027          Subjective   No overnight events.  Persistent right and left shoulder pain as well as right first and second digit  paresthesias.  Otherwise no complaints.  Objective   Vitals:   12/20/19 1927 12/21/19 0000 12/21/19 0400 12/21/19 0806  BP: 125/68 115/73 130/78 129/79  Pulse:    78  Resp:    15  Temp: 98.5 F (36.9 C) 98 F (36.7 C) 98.4 F (36.9 C) 98.2 F (36.8 C)  TempSrc: Oral Oral Oral Oral  SpO2:    98%  Weight:      Height:        Intake/Output Summary (Last 24 hours) at 12/21/2019 1217 Last data filed at 12/21/2019 4825 Gross per 24 hour  Intake 950.66 ml  Output 2700 ml  Net -1749.34 ml   Filed Weights   12/20/19 0135  Weight: 71.5 kg    Examination:  Physical Exam Vitals and nursing note reviewed.  Constitutional:      General: He is not in acute distress. Eyes:     Conjunctiva/sclera: Conjunctivae normal.  Cardiovascular:     Rate and Rhythm: Normal rate and regular rhythm.     Heart sounds: No murmur.  Pulmonary:     Effort: Pulmonary effort is normal.     Breath sounds: Normal breath sounds.  Abdominal:     General: Abdomen is flat.  Musculoskeletal:        General: No swelling.     Right lower leg: No edema.     Left lower leg: No edema.  Neurological:     Mental Status: He is alert.     Comments: Paresthesias of right first and second digit Weak grip in right hand  Psychiatric:        Mood and Affect: Mood normal.        Behavior: Behavior normal.     Data Reviewed: I have personally reviewed following labs and imaging studies  CBC: Recent Labs  Lab 12/19/19 1515 12/20/19 0439 12/20/19 1314 12/21/19 0452  WBC 14.4* 6.1 5.7 5.7  NEUTROABS 12.6* 3.6  --   --   HGB 11.3* 9.8* 11.1* 11.1*  HCT 35.1* 29.9* 34.0* 33.3*  MCV 81.6 81.3 80.6 79.3*  PLT 325 225 241 274   Basic Metabolic Panel: Recent Labs  Lab 12/19/19 1515 12/20/19 0439 12/20/19 1314 12/21/19 0452  NA 138 137  --  137  K 4.7 3.8  --  3.6  CL 105 103  --  102  CO2 24 26  --  24  GLUCOSE 195* 88  --  125*  BUN 23* 16  --  12  CREATININE 1.19 0.99 1.08 0.95  CALCIUM 9.0  8.7*  --  8.6*  MG  --  1.6*  --   --    GFR:  Estimated Creatinine Clearance: 109.8 mL/min (by C-G formula based on SCr of 0.95 mg/dL). Liver Function Tests: Recent Labs  Lab 12/19/19 1515 12/20/19 0439  AST 17 10*  ALT 15 13  ALKPHOS 112 89  BILITOT 0.2* 0.6  PROT 8.4* 6.2*  ALBUMIN 3.6 2.6*   No results for input(s): LIPASE, AMYLASE in the last 168 hours. No results for input(s): AMMONIA in the last 168 hours. Coagulation Profile: Recent Labs  Lab 12/19/19 1743  INR 1.2   Cardiac Enzymes: Recent Labs  Lab 12/19/19 1515  CKTOTAL 86   BNP (last 3 results) No results for input(s): PROBNP in the last 8760 hours. HbA1C: No results for input(s): HGBA1C in the last 72 hours. CBG: No results for input(s): GLUCAP in the last 168 hours. Lipid Profile: No results for input(s): CHOL, HDL, LDLCALC, TRIG, CHOLHDL, LDLDIRECT in the last 72 hours. Thyroid Function Tests: No results for input(s): TSH, T4TOTAL, FREET4, T3FREE, THYROIDAB in the last 72 hours. Anemia Panel: Recent Labs    12/21/19 0452  FERRITIN 72  TIBC 342  IRON 38*   Sepsis Labs: Recent Labs  Lab 12/19/19 1541  LATICACIDVEN 1.2    Recent Results (from the past 240 hour(s))  Culture, blood (routine x 2)     Status: None (Preliminary result)   Collection Time: 12/19/19  3:41 PM   Specimen: BLOOD  Result Value Ref Range Status   Specimen Description BLOOD  R FOREARM  Final   Special Requests   Final    BOTTLES DRAWN AEROBIC AND ANAEROBIC Blood Culture adequate volume   Culture   Final    NO GROWTH 2 DAYS Performed at [facility], [address] [place], [place] [address]    Report Status PENDING  Incomplete  Culture, blood (routine x 2)     Status: None (Preliminary result)   Collection Time: 12/19/19  3:41 PM   Specimen: BLOOD  Result Value Ref Range Status   Specimen Description   Final    BLOOD  L FOREARM Performed at [facility], [address], [place], [place]  [address]    Special Requests   Final    BOTTLES DRAWN AEROBIC AND ANAEROBIC Blood Culture adequate volume Performed at [facility], [address] [place], [place] [address]    Culture  Setup Time   Final    GRAM POSITIVE COCCI IN BOTH AEROBIC AND ANAEROBIC BOTTLES CRITICAL RESULT CALLED TO, READ BACK BY AND VERIFIED WITH: JEENA PIGG AND V BRYAK AT 0719 ON 12/20/19 SNG    Culture   Final    GRAM POSITIVE COCCI IDENTIFICATION TO FOLLOW Performed at [facility], [address] [place], [place] [address]    Report Status PENDING  Incomplete  Blood Culture ID Panel (Reflexed)     Status: Abnormal   Collection Time: 12/19/19  3:41 PM  Result Value Ref Range Status   Enterococcus species NOT DETECTED NOT DETECTED Final   Listeria monocytogenes NOT DETECTED NOT DETECTED Final   Staphylococcus species DETECTED (A) NOT DETECTED Final    Comment: Methicillin (oxacillin) resistant coagulase negative staphylococcus. Possible blood culture contaminant (unless isolated from more than one blood culture draw or clinical case suggests pathogenicity). No antibiotic treatment is indicated for blood  culture contaminants. CRITICAL RESULT CALLED TO, READ BACK BY AND VERIFIED WITH: KAREN PATTON AT 0944 ON 12/20/19 SNG    Staphylococcus aureus (BCID) NOT DETECTED NOT DETECTED Final   Methicillin resistance DETECTED (A) NOT DETECTED  Final    Comment: CRITICAL RESULT CALLED TO, READ BACK BY AND VERIFIED WITH: KAREN PATTON AT 0944 ON 12/20/19 SNG    Streptococcus species NOT DETECTED NOT DETECTED Final   Streptococcus agalactiae NOT DETECTED NOT DETECTED Final   Streptococcus pneumoniae NOT DETECTED NOT DETECTED Final   Streptococcus pyogenes NOT DETECTED NOT DETECTED Final   Acinetobacter baumannii NOT DETECTED NOT DETECTED Final   Enterobacteriaceae species NOT DETECTED NOT DETECTED Final   Enterobacter cloacae complex NOT DETECTED NOT DETECTED Final   Escherichia coli NOT DETECTED NOT  DETECTED Final   Klebsiella oxytoca NOT DETECTED NOT DETECTED Final   Klebsiella pneumoniae NOT DETECTED NOT DETECTED Final   Proteus species NOT DETECTED NOT DETECTED Final   Serratia marcescens NOT DETECTED NOT DETECTED Final   Haemophilus influenzae NOT DETECTED NOT DETECTED Final   Neisseria meningitidis NOT DETECTED NOT DETECTED Final   Pseudomonas aeruginosa NOT DETECTED NOT DETECTED Final   Candida albicans NOT DETECTED NOT DETECTED Final   Candida glabrata NOT DETECTED NOT DETECTED Final   Candida krusei NOT DETECTED NOT DETECTED Final   Candida parapsilosis NOT DETECTED NOT DETECTED Final   Candida tropicalis NOT DETECTED NOT DETECTED Final    Comment: Performed at [facility], [address] [place], [place] [address]  Respiratory Panel by RT PCR (Flu A&B, Covid) - Nasopharyngeal Swab     Status: None   Collection Time: 12/19/19  5:43 PM   Specimen: Nasopharyngeal Swab  Result Value Ref Range Status   SARS Coronavirus 2 by RT PCR NEGATIVE NEGATIVE Final    Comment: (NOTE) SARS-CoV-2 target nucleic acids are NOT DETECTED. The SARS-CoV-2 RNA is generally detectable in upper respiratoy specimens during the acute phase of infection. The lowest concentration of SARS-CoV-2 viral copies this assay can detect is 131 copies/mL. A negative result does not preclude SARS-Cov-2 infection and should not be used as the sole basis for treatment or other patient management decisions. A negative result may occur with  improper specimen collection/handling, submission of specimen other than nasopharyngeal swab, presence of viral mutation(s) within the areas targeted by this assay, and inadequate number of viral copies (<131 copies/mL). A negative result must be combined with clinical observations, patient history, and epidemiological information. The expected result is Negative. Fact Sheet for Patients:  https://www.moore.com/https://www.fda.gov/media/142436/download Fact Sheet for Healthcare  Providers:  https://www.young.biz/https://www.fda.gov/media/142435/download This test is not yet ap proved or cleared by the [place] FDA and  has been authorized for detection and/or diagnosis of SARS-CoV-2 by FDA under an Emergency Use Authorization (EUA). This EUA will remain  in effect (meaning this test can be used) for the duration of the COVID-19 declaration under Section 564(b)(1) of the Act, 21 U.S.C. section 360bbb-3(b)(1), unless the authorization is terminated or revoked sooner.    Influenza A by PCR NEGATIVE NEGATIVE Final   Influenza B by PCR NEGATIVE NEGATIVE Final    Comment: (NOTE) The Xpert Xpress SARS-CoV-2/FLU/RSV assay is intended as an aid in  the diagnosis of influenza from Nasopharyngeal swab specimens and  should not be used as a sole basis for treatment. Nasal washings and  aspirates are unacceptable for Xpert Xpress SARS-CoV-2/FLU/RSV  testing. Fact Sheet for Patients: https://www.moore.com/https://www.fda.gov/media/142436/download Fact Sheet for Healthcare Providers: https://www.young.biz/https://www.fda.gov/media/142435/download This test is not yet approved or cleared by the [place] FDA and  has been authorized for detection and/or diagnosis of SARS-CoV-2 by  FDA under an Emergency Use Authorization (EUA). This EUA will remain  in effect (meaning this test can be  used) for the duration of the  Covid-19 declaration under Section 564(b)(1) of the Act, 21  U.S.C. section 360bbb-3(b)(1), unless the authorization is  terminated or revoked. Performed at [facility], [address] [place] [address] [place], [place] [address]   MRSA PCR Screening     Status: None   Collection Time: 12/20/19 12:54 AM   Specimen: Nasal Mucosa; Nasopharyngeal  Result Value Ref Range Status   MRSA by PCR NEGATIVE NEGATIVE Final    Comment:        The GeneXpert MRSA Assay (FDA approved for NASAL specimens only), is one component of a comprehensive MRSA colonization surveillance program. It is not intended to diagnose MRSA infection  nor to guide or monitor treatment for MRSA infections. Performed at [facility], [address], [place], [place] [address]   Culture, blood (Routine X 2) w Reflex to ID Panel     Status: None (Preliminary result)   Collection Time: 12/20/19  1:07 PM   Specimen: BLOOD LEFT FOREARM  Result Value Ref Range Status   Specimen Description BLOOD LEFT FOREARM  Final   Special Requests   Final    BOTTLES DRAWN AEROBIC AND ANAEROBIC Blood Culture adequate volume   Culture   Final    NO GROWTH < 24 HOURS Performed at [facility], [address] [place], [place] [address]    Report Status PENDING  Incomplete  Culture, blood (Routine X 2) w Reflex to ID Panel     Status: None (Preliminary result)   Collection Time: 12/20/19  1:16 PM   Specimen: BLOOD LEFT FOREARM  Result Value Ref Range Status   Specimen Description BLOOD LEFT FOREARM  Final   Special Requests   Final    BOTTLES DRAWN AEROBIC AND ANAEROBIC Blood Culture adequate volume   Culture   Final    NO GROWTH < 24 HOURS Performed at [facility], [address] [place], [place] [address]    Report Status PENDING  Incomplete         Radiology Studies: CT Head Wo Contrast  Result Date: 12/19/2019 CLINICAL DATA:  Found down EXAM: CT HEAD WITHOUT CONTRAST CT CERVICAL SPINE WITHOUT CONTRAST TECHNIQUE: Multidetector CT imaging of the head and cervical spine was performed following the standard protocol without intravenous contrast. Multiplanar CT image reconstructions of the cervical spine were also generated. COMPARISON:  None. FINDINGS: CT HEAD FINDINGS Brain: There is no mass, hemorrhage or extra-axial collection. The size and configuration of the ventricles and extra-axial CSF spaces are normal. The brain parenchyma is normal, without evidence of acute or chronic infarction. Vascular: No abnormal hyperdensity of the major intracranial arteries or dural venous sinuses. No intracranial atherosclerosis. Skull: The  visualized skull base, calvarium and extracranial soft tissues are normal. Sinuses/Orbits: No fluid levels or advanced mucosal thickening of the visualized paranasal sinuses. No mastoid or middle ear effusion. The orbits are normal. CT CERVICAL SPINE FINDINGS Alignment: Grade 1 retrolisthesis at C6-7. Skull base and vertebrae: There is abnormal lucency at the adjacent endplates of C6-7. no fracture. Soft tissues and spinal canal: No prevertebral fluid or swelling. No visible canal hematoma. Disc levels: No advanced spinal canal or neural foraminal stenosis. Upper chest: No pneumothorax, pulmonary nodule or pleural effusion. Other: Normal visualized paraspinal cervical soft tissues. IMPRESSION: 1. No acute intracranial abnormality. 2. No acute fracture of the cervical spine. 3. Abnormal lucency at the adjacent endplates of C6-7. This may be a manifestation of degenerative disc disease, but  the appearance is more concerning for discitis osteomyelitis in a patient with history of IV drug use. MRI of the cervical spine with and without contrast is recommended for further characterization. Electronically Signed   By: Ulyses Jarred M.D.   On: 12/19/2019 16:42   CT Cervical Spine Wo Contrast  Result Date: 12/19/2019 CLINICAL DATA:  Found down EXAM: CT HEAD WITHOUT CONTRAST CT CERVICAL SPINE WITHOUT CONTRAST TECHNIQUE: Multidetector CT imaging of the head and cervical spine was performed following the standard protocol without intravenous contrast. Multiplanar CT image reconstructions of the cervical spine were also generated. COMPARISON:  None. FINDINGS: CT HEAD FINDINGS Brain: There is no mass, hemorrhage or extra-axial collection. The size and configuration of the ventricles and extra-axial CSF spaces are normal. The brain parenchyma is normal, without evidence of acute or chronic infarction. Vascular: No abnormal hyperdensity of the major intracranial arteries or dural venous sinuses. No intracranial  atherosclerosis. Skull: The visualized skull base, calvarium and extracranial soft tissues are normal. Sinuses/Orbits: No fluid levels or advanced mucosal thickening of the visualized paranasal sinuses. No mastoid or middle ear effusion. The orbits are normal. CT CERVICAL SPINE FINDINGS Alignment: Grade 1 retrolisthesis at C6-7. Skull base and vertebrae: There is abnormal lucency at the adjacent endplates of G6-4. no fracture. Soft tissues and spinal canal: No prevertebral fluid or swelling. No visible canal hematoma. Disc levels: No advanced spinal canal or neural foraminal stenosis. Upper chest: No pneumothorax, pulmonary nodule or pleural effusion. Other: Normal visualized paraspinal cervical soft tissues. IMPRESSION: 1. No acute intracranial abnormality. 2. No acute fracture of the cervical spine. 3. Abnormal lucency at the adjacent endplates of Q0-3. This may be a manifestation of degenerative disc disease, but the appearance is more concerning for discitis osteomyelitis in a patient with history of IV drug use. MRI of the cervical spine with and without contrast is recommended for further characterization. Electronically Signed   By: Ulyses Jarred M.D.   On: 12/19/2019 16:42   MR Cervical Spine W or Wo Contrast  Result Date: 12/19/2019 CLINICAL DATA:  34 year old male found down. Cervical spine CT earlier today suspicious for discitis osteomyelitis at C6-C7. EXAM: MRI CERVICAL SPINE WITHOUT AND WITH CONTRAST TECHNIQUE: Multiplanar and multiecho pulse sequences of the cervical spine, to include the craniocervical junction and cervicothoracic junction, were obtained without and with intravenous contrast. CONTRAST:  41mL GADAVIST GADOBUTROL 1 MMOL/ML IV SOLN COMPARISON:  CT cervical spine 1622 hours today. FINDINGS: Alignment: Stable straightening of cervical lordosis with mild retrolisthesis at C6-C7. Vertebrae: Confluent abnormal marrow edema and enhancement in the C6 and C7 vertebral bodies. Mild right side  pedicle edema also. Loss of the intervening disc space which is mostly low signal on MRI, however, there is abundant prevertebral and longus coli muscle edema emanating from that level. Superimposed retropharyngeal effusion. Abnormal thickened and enhancing soft tissue in the ventral right epidural space (series 14 image 7 and series 15, image 24). No drainable fluid collection identified. Inflammation appears to extend into the right C7 neural foramen. No superimposed ventral dural thickening, although there does appear to be mild dorsal dural thickening extending cephalad from that level. The C5 and the T1 levels are not inflamed. There is chronic C5-C6 disc and endplate degeneration. Cord: Spinal stenosis and spinal cord mass effect at C5-C6 (degenerative) and C6-C7 (inflammatory) but no definite abnormal cord signal. No abnormal intradural enhancement. Posterior Fossa, vertebral arteries, paraspinal tissues: Cervicomedullary junction is within normal limits. Negative visible posterior fossa. Preserved major vascular flow voids  in the neck. Abnormal prevertebral soft tissues maximal at C6-C7 as detailed above. Pronounced bilateral longus coli muscle edema at C6 and C7. No drainable fluid collection. Disc levels: Abnormal C6-C7 level detailed above. Superimposed chronic appearing disc and endplate degeneration at C5-C6 with right eccentric disc osteophyte complex. Mild spinal stenosis, mild spinal cord mass effect, mild to moderate left and severe right C6 foraminal stenosis. IMPRESSION: 1. Positive for Discitis Osteomyelitis at C6-C7. Mild retrolisthesis at that level with prevertebral fluid and edema, and phlegmon in the ventral epidural space resulting in spinal stenosis and spinal cord mass effect. But spinal cord signal abnormality. And no drainable fluid collection. 2. Superimposed chronic C5-C6 degeneration with degenerative spinal and severe right foraminal stenosis. Electronically Signed   By: Odessa Fleming  M.D.   On: 12/19/2019 19:39   DG Chest Portable 1 View  Result Date: 12/19/2019 CLINICAL DATA:  Altered mental status, lethargy EXAM: PORTABLE CHEST 1 VIEW COMPARISON:  12/06/2019 FINDINGS: The heart size and mediastinal contours are within normal limits. Both lungs are clear. The visualized skeletal structures are unremarkable. IMPRESSION: No active disease. Electronically Signed   By: Judie Petit.  Shick M.D.   On: 12/19/2019 18:05   ECHOCARDIOGRAM COMPLETE  Result Date: 12/20/2019    ECHOCARDIOGRAM REPORT   Patient Name:   SHERIDAN RITCHISON Date of Exam: 12/20/2019 Medical Rec #:  [id number]     Height:       73.0 in Accession #:    [id number]    Weight:       157.6 lb Date of Birth:  1986-09-12     BSA:          1.945 m Patient Age:    33 years      BP:           122/69 mmHg Patient Gender: M             HR:           66 bpm. [facility] Procedure: 2D Echo, Cardiac Doppler and Color Doppler Indications:    Syncope 780.2 / R55  History:        Patient has no prior history of Echocardiogram examinations.                 Risk Factors:Current Smoker. IV drug abuse.  Sonographer:    Celesta Gentile RCS Referring Phys: [id number] TIMOTHY S OPYD IMPRESSIONS  1. Left ventricular ejection fraction, by estimation, is 65 to 70%. The left ventricle has normal function. The left ventricle has no regional wall motion abnormalities. Left ventricular diastolic parameters were normal.  2. Right ventricular systolic function is normal. The right ventricular size is normal. There is normal pulmonary artery systolic pressure. The estimated right ventricular systolic pressure is 28.8 mmHg.  3. The mitral valve is grossly normal. Trivial mitral valve regurgitation.  4. The aortic valve is tricuspid. Aortic valve regurgitation is not visualized.  5. The inferior vena cava is normal in size with greater than 50% respiratory variability, suggesting right atrial pressure of 3 mmHg. FINDINGS  Left Ventricle: Left ventricular ejection  fraction, by estimation, is 65 to 70%. The left ventricle has normal function. The left ventricle has no regional wall motion abnormalities. The left ventricular internal cavity size was normal in size. There is  borderline left ventricular hypertrophy. Left ventricular diastolic parameters were normal. Right Ventricle: The right ventricular size is normal. No increase in right ventricular wall thickness. Right ventricular systolic function is normal.  There is normal pulmonary artery systolic pressure. The tricuspid regurgitant velocity is 2.54 m/s, and  with an assumed right atrial pressure of 3 mmHg, the estimated right ventricular systolic pressure is 28.8 mmHg. Left Atrium: Left atrial size was normal in size. Right Atrium: Right atrial size was normal in size. Pericardium: There is no evidence of pericardial effusion. Mitral Valve: The mitral valve is grossly normal. Trivial mitral valve regurgitation. Tricuspid Valve: The tricuspid valve is grossly normal. Tricuspid valve regurgitation is mild. Aortic Valve: The aortic valve is tricuspid. Aortic valve regurgitation is not visualized. Pulmonic Valve: The pulmonic valve was grossly normal. Pulmonic valve regurgitation is trivial. Aorta: The aortic root is normal in size and structure. Venous: The inferior vena cava is normal in size with greater than 50% respiratory variability, suggesting right atrial pressure of 3 mmHg. IAS/Shunts: No atrial level shunt detected by color flow Doppler.  LEFT VENTRICLE PLAX 2D LVIDd:         5.40 cm      Diastology LVIDs:         2.90 cm      LV e' lateral:   15.20 cm/s LV PW:         1.10 cm      LV E/e' lateral: 5.3 LV IVS:        0.90 cm      LV e' medial:    10.10 cm/s LVOT diam:     2.00 cm      LV E/e' medial:  7.9 LV SV:         75.71 ml LV SV Index:   38.94 LVOT Area:     3.14 cm  LV Volumes (MOD) LV vol d, MOD A2C: 110.0 ml LV vol d, MOD A4C: 93.3 ml LV vol s, MOD A2C: 33.8 ml LV vol s, MOD A4C: 26.0 ml LV SV MOD A2C:      76.2 ml LV SV MOD A4C:     93.3 ml LV SV MOD BP:      75.8 ml RIGHT VENTRICLE RV S prime:     13.90 cm/s TAPSE (M-mode): 2.3 cm LEFT ATRIUM             Index       RIGHT ATRIUM           Index LA diam:        3.00 cm 1.54 cm/m  RA Area:     16.60 cm LA Vol (A2C):   50.1 ml 25.76 ml/m RA Volume:   45.40 ml  23.35 ml/m LA Vol (A4C):   50.2 ml 25.82 ml/m LA Biplane Vol: 51.9 ml 26.69 ml/m  AORTIC VALVE LVOT Vmax:   129.00 cm/s LVOT Vmean:  78.100 cm/s LVOT VTI:    0.241 m  AORTA Ao Root diam: 3.10 cm MITRAL VALVE               TRICUSPID VALVE MV Area (PHT): 4.58 cm    TR Peak grad:   25.8 mmHg MV Decel Time: 166 msec    TR Vmax:        254.00 cm/s MV E velocity: 80.20 cm/s MV A velocity: 58.30 cm/s  SHUNTS MV E/A ratio:  1.38        Systemic VTI:  0.24 m                            Systemic Diam: 2.00 cm Nona Dell MD Electronically  signed by Nona DellSamuel Mcdowell MD Signature Date/Time: 12/20/2019/3:48:14 PM    Final         Scheduled Meds: . enoxaparin (LOVENOX) injection  40 mg Subcutaneous Q24H  . gabapentin  100 mg Oral TID  . nicotine  14 mg Transdermal Daily  . sodium chloride flush  3 mL Intravenous Q12H   Continuous Infusions: . cefTRIAXone (ROCEPHIN)  IV 2 g (12/21/19 0012)  . vancomycin 1,750 mg (12/21/19 0534)     Time spent: 25 minutes with over 50% of the time coordinating the patient's care    Jae DireJared E Hazelee Harbold, DO [facility] Pager [phone]  Call night coverage person covering after 7pm

## 2019-12-21 NOTE — Progress Notes (Signed)
Pharmacy Antibiotic Note  Michael Lester is a 34 y.o. male admitted on 12/19/2019 with osteomyelitis.  Pharmacy has been consulted for vancomycin dosing. Vancomycin 1750mg  IV q12hr with VP 38, VT 13 AUC 640 Which is > goal will decrease dose  Plan: Decrease vancomycin 1250 mg IV q12 hours F/u renal function, cultures and vanc peak and trough  Height: 6\' 1"  (185.4 cm) Weight: 157 lb 10.1 oz (71.5 kg) IBW/kg (Calculated) : 79.9  Temp (24hrs), Avg:98.2 F (36.8 C), Min:97.9 F (36.6 C), Max:98.4 F (36.9 C)  Recent Labs  Lab 12/19/19 1515 12/19/19 1541 12/20/19 0439 12/20/19 1314 12/21/19 0452 12/21/19 1700 12/21/19 2137  WBC 14.4*  --  6.1 5.7 5.7  --   --   CREATININE 1.19  --  0.99 1.08 0.95  --   --   LATICACIDVEN  --  1.2  --   --   --   --   --   VANCOTROUGH  --   --   --   --   --  13*  --   VANCOPEAK  --   --   --   --   --   --  38    Estimated Creatinine Clearance: 109.8 mL/min (by C-G formula based on SCr of 0.95 mg/dL).    Allergies  Allergen Reactions  . Shellfish Allergy Anaphylaxis     12/23/19 Pharm.D. CPP, BCPS Clinical Pharmacist [phone] 12/21/2019 11:03 PM

## 2019-12-21 NOTE — Consult Note (Signed)
Reason for Consult: Osteomyelitis discitis of cervical spine Referring Physician: Medicine  Michael Lester is an 34 y.o. male.  HPI: 34 year old male who with history of IV drug abuse.  Patient presented with evidence of drug overdose and sepsis to outside emergency room.  Work-up of his cervical spine was consistent with osteomyelitis discitis at C6-7.  MRI scanning also demonstrates the osteomyelitis discitis at C6-7 with a significant abscess in his prevertebral space.  Patient with severe neck pain and severe right upper extremity radicular pain mostly consistent with a right-sided C7 radiculopathy.  MRI scanning demonstrates evidence of significant disc disease and associated spondylosis with some stenosis on the right at C5-6.  At C6-7 the patient has a fully involved disc space infection with some epidural abscess again worse to the right side causing marked compression of his right C7 nerve root.  Patient denies any lower extremity symptoms.  Is having no bowel or bladder dysfunction. History reviewed. No pertinent past medical history.  Past Surgical History:  Procedure Laterality Date  . LACERATION REPAIR N/A 11/16/2014   Procedure: REPAIR MULTIPLE LACERATIONS;  Surgeon: Melvenia Beam, MD;  Location: [facility] OR;  Service: ENT;  Laterality: N/A;  . LACERATION REPAIR Left 11/16/2014   Procedure: REPAIR MULTIPLE LACERATIONS;  Surgeon: Manus Rudd, MD;  Location: [facility] OR;  Service: General;  Laterality: Left;  irrigation and suturing of lacerations on upper body    History reviewed. No pertinent family history.  Social History:  reports that he has been smoking. He has been smoking about 0.50 packs per day. He has never used smokeless tobacco. He reports previous alcohol use. He reports current drug use. Drugs: Marijuana and Heroin.  Allergies:  Allergies  Allergen Reactions  . Shellfish Allergy Anaphylaxis    Medications:  Prior to Admission:  Medications Prior to Admission  Medication Sig  Dispense Refill Last Dose  . ibuprofen (ADVIL) 200 MG tablet Take 400 mg by mouth every 6 (six) hours as needed for headache (pain).   12/18/2019  . azithromycin (ZITHROMAX Z-PAK) 250 MG tablet Dispense 1 Z-Pak as directed (Patient not taking: Reported on 12/20/2019) 6 each 0 Not Taking at Unknown time    Results for orders placed or performed during the hospital encounter of 12/19/19 (from the past 48 hour(s))  MRSA PCR Screening     Status: None   Collection Time: 12/20/19 12:54 AM   Specimen: Nasal Mucosa; Nasopharyngeal  Result Value Ref Range   MRSA by PCR NEGATIVE NEGATIVE    Comment:        The GeneXpert MRSA Assay (FDA approved for NASAL specimens only), is one component of a comprehensive MRSA colonization surveillance program. It is not intended to diagnose MRSA infection nor to guide or monitor treatment for MRSA infections. Performed at [facility], [address] [place], [place] [address]   HIV Antibody (routine testing w rflx)     Status: Abnormal   Collection Time: 12/20/19  4:39 AM  Result Value Ref Range   HIV Screen 4th Generation wRfx Non Reactive (A) Non Reactive    Comment: (NOTE) Performed At: [facility] [address] [place], [place] [id number] Jolene Schimke MD [id number]   Comprehensive metabolic panel     Status: Abnormal   Collection Time: 12/20/19  4:39 AM  Result Value Ref Range   Sodium 137 135 - 145 mmol/L   Potassium 3.8 3.5 - 5.1 mmol/L   Chloride 103 98 - 111 mmol/L   CO2 26 22 -  32 mmol/L   Glucose, Bld 88 70 - 99 mg/dL   BUN 16 6 - 20 mg/dL   Creatinine, Ser 0.99 0.61 - 1.24 mg/dL   Calcium 8.7 (L) 8.9 - 10.3 mg/dL   Total Protein 6.2 (L) 6.5 - 8.1 g/dL   Albumin 2.6 (L) 3.5 - 5.0 g/dL   AST 10 (L) 15 - 41 U/L   ALT 13 0 - 44 U/L   Alkaline Phosphatase 89 38 - 126 U/L   Total Bilirubin 0.6 0.3 - 1.2 mg/dL   GFR calc non Af Amer >60 >60 mL/min   GFR calc Af Amer >60 >60 mL/min   Anion gap 8 5 - 15    Comment:  Performed at [facility] [address] [place], [place] [address]  Magnesium     Status: Abnormal   Collection Time: 12/20/19  4:39 AM  Result Value Ref Range   Magnesium 1.6 (L) 1.7 - 2.4 mg/dL    Comment: Performed at [facility] [address] [place], [place] [address]  CBC WITH DIFFERENTIAL     Status: Abnormal   Collection Time: 12/20/19  4:39 AM  Result Value Ref Range   WBC 6.1 4.0 - 10.5 K/uL   RBC 3.68 (L) 4.22 - 5.81 MIL/uL   Hemoglobin 9.8 (L) 13.0 - 17.0 g/dL   HCT 29.9 (L) 39.0 - 52.0 %   MCV 81.3 80.0 - 100.0 fL   MCH 26.6 26.0 - 34.0 pg   MCHC 32.8 30.0 - 36.0 g/dL   RDW 13.7 11.5 - 15.5 %   Platelets 225 150 - 400 K/uL   nRBC 0.0 0.0 - 0.2 %   Neutrophils Relative % 58 %   Neutro Abs 3.6 1.7 - 7.7 K/uL   Lymphocytes Relative 32 %   Lymphs Abs 2.0 0.7 - 4.0 K/uL   Monocytes Relative 7 %   Monocytes Absolute 0.4 0.1 - 1.0 K/uL   Eosinophils Relative 2 %   Eosinophils Absolute 0.1 0.0 - 0.5 K/uL   Basophils Relative 1 %   Basophils Absolute 0.0 0.0 - 0.1 K/uL   Immature Granulocytes 0 %   Abs Immature Granulocytes 0.02 0.00 - 0.07 K/uL    Comment: Performed at [facility], [address], [place], [place] [address]  Type and screen [place]     Status: None   Collection Time: 12/20/19  4:39 AM  Result Value Ref Range   ABO/RH(D) O POS    Antibody Screen NEG    Sample Expiration      12/23/2019,2359 Performed at [facility], [place] [address] [place], [place] [address]   ABO/Rh     Status: None   Collection Time: 12/20/19  4:39 AM  Result Value Ref Range   ABO/RH(D)      O POS Performed at [facility] [address] [place], [place] [address]   Culture, blood (Routine X 2) w Reflex to ID Panel     Status: None (Preliminary result)   Collection Time: 12/20/19  1:07 PM   Specimen: BLOOD LEFT FOREARM  Result Value Ref Range   Specimen Description BLOOD LEFT FOREARM    Special Requests       BOTTLES DRAWN AEROBIC AND ANAEROBIC Blood Culture adequate volume   Culture      NO GROWTH < 24 HOURS Performed at [facility], [place] [address], [place], [place] [address]  Report Status PENDING   CBC     Status: Abnormal   Collection Time: 12/20/19  1:14 PM  Result Value Ref Range   WBC 5.7 4.0 - 10.5 K/uL   RBC 4.22 4.22 - 5.81 MIL/uL   Hemoglobin 11.1 (L) 13.0 - 17.0 g/dL   HCT 51.0 (L) 25.8 - 52.7 %   MCV 80.6 80.0 - 100.0 fL   MCH 26.3 26.0 - 34.0 pg   MCHC 32.6 30.0 - 36.0 g/dL   RDW 78.2 42.3 - 53.6 %   Platelets 241 150 - 400 K/uL   nRBC 0.0 0.0 - 0.2 %    Comment: Performed at [facility], [address] [place], [place] [address]  Creatinine, serum     Status: None   Collection Time: 12/20/19  1:14 PM  Result Value Ref Range   Creatinine, Ser 1.08 0.61 - 1.24 mg/dL   GFR calc non Af Amer >60 >60 mL/min   GFR calc Af Amer >60 >60 mL/min    Comment: Performed at [facility], [address] [place], [place] [address]  Culture, blood (Routine X 2) w Reflex to ID Panel     Status: None (Preliminary result)   Collection Time: 12/20/19  1:16 PM   Specimen: BLOOD LEFT FOREARM  Result Value Ref Range   Specimen Description BLOOD LEFT FOREARM    Special Requests      BOTTLES DRAWN AEROBIC AND ANAEROBIC Blood Culture adequate volume   Culture      NO GROWTH < 24 HOURS Performed at [facility], [address], [place], [place] [address]    Report Status PENDING   Ferritin     Status: None   Collection Time: 12/21/19  4:52 AM  Result Value Ref Range   Ferritin 72 24 - 336 ng/mL    Comment: Performed at [facility], [address], [place], [place] [address]  Iron and TIBC     Status: Abnormal   Collection Time: 12/21/19  4:52 AM  Result Value Ref Range   Iron 38 (L) 45 - 182 ug/dL   TIBC 671 245 - 809 ug/dL   Saturation Ratios 11 (L) 17.9 - 39.5 %   UIBC 304 ug/dL    Comment: Performed at [facility], [address],  [place], [place] [address]  CBC     Status: Abnormal   Collection Time: 12/21/19  4:52 AM  Result Value Ref Range   WBC 5.7 4.0 - 10.5 K/uL   RBC 4.20 (L) 4.22 - 5.81 MIL/uL   Hemoglobin 11.1 (L) 13.0 - 17.0 g/dL   HCT 25.0 (L) 53.9 - 76.7 %   MCV 79.3 (L) 80.0 - 100.0 fL   MCH 26.4 26.0 - 34.0 pg   MCHC 33.3 30.0 - 36.0 g/dL   RDW 34.1 93.7 - 90.2 %   Platelets 274 150 - 400 K/uL   nRBC 0.0 0.0 - 0.2 %    Comment: Performed at [facility], [address] [place], [place] [address]  Basic metabolic panel     Status: Abnormal   Collection Time: 12/21/19  4:52 AM  Result Value Ref Range   Sodium 137 135 - 145 mmol/L   Potassium 3.6 3.5 - 5.1 mmol/L   Chloride 102 98 - 111 mmol/L   CO2 24 22 - 32 mmol/L   Glucose, Bld 125 (H) 70 - 99 mg/dL   BUN 12 6 -  20 mg/dL   Creatinine, Ser 8.290.95 0.61 - 1.24 mg/dL   Calcium 8.6 (L) 8.9 - 10.3 mg/dL   GFR calc non Af Amer >60 >60 mL/min   GFR calc Af Amer >60 >60 mL/min   Anion gap 11 5 - 15    Comment: Performed at [facility], [address] [place], [place] [address]  C-reactive protein     Status: Abnormal   Collection Time: 12/21/19  4:52 AM  Result Value Ref Range   CRP 2.8 (H) <1.0 mg/dL    Comment: Performed at [facility], [address] [place], [place] [address]  Sedimentation rate     Status: Abnormal   Collection Time: 12/21/19  4:52 AM  Result Value Ref Range   Sed Rate 35 (H) 0 - 16 mm/hr    Comment: Performed at [facility], [address] [place], [place] [address]    CT Head Wo Contrast  Result Date: 12/19/2019 CLINICAL DATA:  Found down EXAM: CT HEAD WITHOUT CONTRAST CT CERVICAL SPINE WITHOUT CONTRAST TECHNIQUE: Multidetector CT imaging of the head and cervical spine was performed following the standard protocol without intravenous contrast. Multiplanar CT image reconstructions of the cervical spine were also generated. COMPARISON:  None. FINDINGS: CT HEAD FINDINGS Brain: There is no mass,  hemorrhage or extra-axial collection. The size and configuration of the ventricles and extra-axial CSF spaces are normal. The brain parenchyma is normal, without evidence of acute or chronic infarction. Vascular: No abnormal hyperdensity of the major intracranial arteries or dural venous sinuses. No intracranial atherosclerosis. Skull: The visualized skull base, calvarium and extracranial soft tissues are normal. Sinuses/Orbits: No fluid levels or advanced mucosal thickening of the visualized paranasal sinuses. No mastoid or middle ear effusion. The orbits are normal. CT CERVICAL SPINE FINDINGS Alignment: Grade 1 retrolisthesis at C6-7. Skull base and vertebrae: There is abnormal lucency at the adjacent endplates of C6-7. no fracture. Soft tissues and spinal canal: No prevertebral fluid or swelling. No visible canal hematoma. Disc levels: No advanced spinal canal or neural foraminal stenosis. Upper chest: No pneumothorax, pulmonary nodule or pleural effusion. Other: Normal visualized paraspinal cervical soft tissues. IMPRESSION: 1. No acute intracranial abnormality. 2. No acute fracture of the cervical spine. 3. Abnormal lucency at the adjacent endplates of C6-7. This may be a manifestation of degenerative disc disease, but the appearance is more concerning for discitis osteomyelitis in a patient with history of IV drug use. MRI of the cervical spine with and without contrast is recommended for further characterization. Electronically Signed   By: Deatra RobinsonKevin  Herman M.D.   On: 12/19/2019 16:42   CT Cervical Spine Wo Contrast  Result Date: 12/19/2019 CLINICAL DATA:  Found down EXAM: CT HEAD WITHOUT CONTRAST CT CERVICAL SPINE WITHOUT CONTRAST TECHNIQUE: Multidetector CT imaging of the head and cervical spine was performed following the standard protocol without intravenous contrast. Multiplanar CT image reconstructions of the cervical spine were also generated. COMPARISON:  None. FINDINGS: CT HEAD FINDINGS Brain: There  is no mass, hemorrhage or extra-axial collection. The size and configuration of the ventricles and extra-axial CSF spaces are normal. The brain parenchyma is normal, without evidence of acute or chronic infarction. Vascular: No abnormal hyperdensity of the major intracranial arteries or dural venous sinuses. No intracranial atherosclerosis. Skull: The visualized skull base, calvarium and extracranial soft tissues are normal. Sinuses/Orbits: No fluid levels or advanced mucosal thickening of the visualized paranasal sinuses. No mastoid or middle ear effusion. The orbits are  normal. CT CERVICAL SPINE FINDINGS Alignment: Grade 1 retrolisthesis at C6-7. Skull base and vertebrae: There is abnormal lucency at the adjacent endplates of C6-7. no fracture. Soft tissues and spinal canal: No prevertebral fluid or swelling. No visible canal hematoma. Disc levels: No advanced spinal canal or neural foraminal stenosis. Upper chest: No pneumothorax, pulmonary nodule or pleural effusion. Other: Normal visualized paraspinal cervical soft tissues. IMPRESSION: 1. No acute intracranial abnormality. 2. No acute fracture of the cervical spine. 3. Abnormal lucency at the adjacent endplates of C6-7. This may be a manifestation of degenerative disc disease, but the appearance is more concerning for discitis osteomyelitis in a patient with history of IV drug use. MRI of the cervical spine with and without contrast is recommended for further characterization. Electronically Signed   By: Deatra Robinson M.D.   On: 12/19/2019 16:42   MR Cervical Spine W or Wo Contrast  Result Date: 12/19/2019 CLINICAL DATA:  34 year old male found down. Cervical spine CT earlier today suspicious for discitis osteomyelitis at C6-C7. EXAM: MRI CERVICAL SPINE WITHOUT AND WITH CONTRAST TECHNIQUE: Multiplanar and multiecho pulse sequences of the cervical spine, to include the craniocervical junction and cervicothoracic junction, were obtained without and with  intravenous contrast. CONTRAST:  32mL GADAVIST GADOBUTROL 1 MMOL/ML IV SOLN COMPARISON:  CT cervical spine 1622 hours today. FINDINGS: Alignment: Stable straightening of cervical lordosis with mild retrolisthesis at C6-C7. Vertebrae: Confluent abnormal marrow edema and enhancement in the C6 and C7 vertebral bodies. Mild right side pedicle edema also. Loss of the intervening disc space which is mostly low signal on MRI, however, there is abundant prevertebral and longus coli muscle edema emanating from that level. Superimposed retropharyngeal effusion. Abnormal thickened and enhancing soft tissue in the ventral right epidural space (series 14 image 7 and series 15, image 24). No drainable fluid collection identified. Inflammation appears to extend into the right C7 neural foramen. No superimposed ventral dural thickening, although there does appear to be mild dorsal dural thickening extending cephalad from that level. The C5 and the T1 levels are not inflamed. There is chronic C5-C6 disc and endplate degeneration. Cord: Spinal stenosis and spinal cord mass effect at C5-C6 (degenerative) and C6-C7 (inflammatory) but no definite abnormal cord signal. No abnormal intradural enhancement. Posterior Fossa, vertebral arteries, paraspinal tissues: Cervicomedullary junction is within normal limits. Negative visible posterior fossa. Preserved major vascular flow voids in the neck. Abnormal prevertebral soft tissues maximal at C6-C7 as detailed above. Pronounced bilateral longus coli muscle edema at C6 and C7. No drainable fluid collection. Disc levels: Abnormal C6-C7 level detailed above. Superimposed chronic appearing disc and endplate degeneration at C5-C6 with right eccentric disc osteophyte complex. Mild spinal stenosis, mild spinal cord mass effect, mild to moderate left and severe right C6 foraminal stenosis. IMPRESSION: 1. Positive for Discitis Osteomyelitis at C6-C7. Mild retrolisthesis at that level with prevertebral  fluid and edema, and phlegmon in the ventral epidural space resulting in spinal stenosis and spinal cord mass effect. But spinal cord signal abnormality. And no drainable fluid collection. 2. Superimposed chronic C5-C6 degeneration with degenerative spinal and severe right foraminal stenosis. Electronically Signed   By: Odessa Fleming M.D.   On: 12/19/2019 19:39   DG Chest Portable 1 View  Result Date: 12/19/2019 CLINICAL DATA:  Altered mental status, lethargy EXAM: PORTABLE CHEST 1 VIEW COMPARISON:  12/06/2019 FINDINGS: The heart size and mediastinal contours are within normal limits. Both lungs are clear. The visualized skeletal structures are unremarkable. IMPRESSION: No active disease. Electronically Signed  By: Osvaldo Shipper M.D.   On: 12/19/2019 18:05   ECHOCARDIOGRAM COMPLETE  Result Date: 12/20/2019    ECHOCARDIOGRAM REPORT   Patient Name:   Michael Lester Date of Exam: 12/20/2019 Medical Rec #:  [id number]     Height:       73.0 in Accession #:    [id number]    Weight:       157.6 lb Date of Birth:  Dec 04, 1985     BSA:          1.945 m Patient Age:    33 years      BP:           122/69 mmHg Patient Gender: M             HR:           66 bpm. [facility] Procedure: 2D Echo, Cardiac Doppler and Color Doppler Indications:    Syncope 780.2 / R55  History:        Patient has no prior history of Echocardiogram examinations.                 Risk Factors:Current Smoker. IV drug abuse.  Sonographer:    Celesta Gentile RCS Referring Phys: [id number] TIMOTHY S OPYD IMPRESSIONS  1. Left ventricular ejection fraction, by estimation, is 65 to 70%. The left ventricle has normal function. The left ventricle has no regional wall motion abnormalities. Left ventricular diastolic parameters were normal.  2. Right ventricular systolic function is normal. The right ventricular size is normal. There is normal pulmonary artery systolic pressure. The estimated right ventricular systolic pressure is 28.8 mmHg.  3. The mitral  valve is grossly normal. Trivial mitral valve regurgitation.  4. The aortic valve is tricuspid. Aortic valve regurgitation is not visualized.  5. The inferior vena cava is normal in size with greater than 50% respiratory variability, suggesting right atrial pressure of 3 mmHg. FINDINGS  Left Ventricle: Left ventricular ejection fraction, by estimation, is 65 to 70%. The left ventricle has normal function. The left ventricle has no regional wall motion abnormalities. The left ventricular internal cavity size was normal in size. There is  borderline left ventricular hypertrophy. Left ventricular diastolic parameters were normal. Right Ventricle: The right ventricular size is normal. No increase in right ventricular wall thickness. Right ventricular systolic function is normal. There is normal pulmonary artery systolic pressure. The tricuspid regurgitant velocity is 2.54 m/s, and  with an assumed right atrial pressure of 3 mmHg, the estimated right ventricular systolic pressure is 28.8 mmHg. Left Atrium: Left atrial size was normal in size. Right Atrium: Right atrial size was normal in size. Pericardium: There is no evidence of pericardial effusion. Mitral Valve: The mitral valve is grossly normal. Trivial mitral valve regurgitation. Tricuspid Valve: The tricuspid valve is grossly normal. Tricuspid valve regurgitation is mild. Aortic Valve: The aortic valve is tricuspid. Aortic valve regurgitation is not visualized. Pulmonic Valve: The pulmonic valve was grossly normal. Pulmonic valve regurgitation is trivial. Aorta: The aortic root is normal in size and structure. Venous: The inferior vena cava is normal in size with greater than 50% respiratory variability, suggesting right atrial pressure of 3 mmHg. IAS/Shunts: No atrial level shunt detected by color flow Doppler.  LEFT VENTRICLE PLAX 2D LVIDd:         5.40 cm      Diastology LVIDs:         2.90 cm      LV e' lateral:  15.20 cm/s LV PW:         1.10 cm      LV E/e'  lateral: 5.3 LV IVS:        0.90 cm      LV e' medial:    10.10 cm/s LVOT diam:     2.00 cm      LV E/e' medial:  7.9 LV SV:         75.71 ml LV SV Index:   38.94 LVOT Area:     3.14 cm  LV Volumes (MOD) LV vol d, MOD A2C: 110.0 ml LV vol d, MOD A4C: 93.3 ml LV vol s, MOD A2C: 33.8 ml LV vol s, MOD A4C: 26.0 ml LV SV MOD A2C:     76.2 ml LV SV MOD A4C:     93.3 ml LV SV MOD BP:      75.8 ml RIGHT VENTRICLE RV S prime:     13.90 cm/s TAPSE (M-mode): 2.3 cm LEFT ATRIUM             Index       RIGHT ATRIUM           Index LA diam:        3.00 cm 1.54 cm/m  RA Area:     16.60 cm LA Vol (A2C):   50.1 ml 25.76 ml/m RA Volume:   45.40 ml  23.35 ml/m LA Vol (A4C):   50.2 ml 25.82 ml/m LA Biplane Vol: 51.9 ml 26.69 ml/m  AORTIC VALVE LVOT Vmax:   129.00 cm/s LVOT Vmean:  78.100 cm/s LVOT VTI:    0.241 m  AORTA Ao Root diam: 3.10 cm MITRAL VALVE               TRICUSPID VALVE MV Area (PHT): 4.58 cm    TR Peak grad:   25.8 mmHg MV Decel Time: 166 msec    TR Vmax:        254.00 cm/s MV E velocity: 80.20 cm/s MV A velocity: 58.30 cm/s  SHUNTS MV E/A ratio:  1.38        Systemic VTI:  0.24 m                            Systemic Diam: 2.00 cm Nona Dell MD Electronically signed by Nona Dell MD Signature Date/Time: 12/20/2019/3:48:14 PM    Final     Pertinent items noted in HPI and remainder of comprehensive ROS otherwise negative. Blood pressure 129/79, pulse 78, temperature 98.2 F (36.8 C), temperature source Oral, resp. rate 15, height 6\' 1"  (1.854 m), weight 71.5 kg, SpO2 98 %. Patient is awake and alert.  He is oriented and reasonably appropriate.  His speech is fluent.  His judgment insight appear fair.  Cranial nerve function normal bilateral.  Motor examination reveals significant weakness of his right-sided triceps muscle group grading at 4-/5.  His weakness in his right grip.  Remainder is motor strength intact.  Sensory examination with decrease sensation involving his right C7 dermatome.  Reflexes  normal.  Examination of the head ears eyes nose throat is unremarkable aside from poor dentition.  He has significant cervical spasm and decreased range of motion.  Airway is midline.  Neck is otherwise soft and thin.  Chest and abdomen are unremarkable.  Assessment/Plan: Patient with cervical osteomyelitis discitis with significant epidural abscess and severe radiculopathy.  I discussed options of treatment including both operative and operative  care.  I discussed in detail the possible moving forward with a C5-6 and C6-7 anterior cervical discectomy with interbody fusion utilizing interbody cages, locally harvested autograft, and anterior plate instrumentation.  We will obtain deep cultures during this surgery to hopefully guide antibiotic therapy.  Plan for surgery tomorrow.  Sherilyn CooterHenry A Davidson Palmieri 12/21/2019, 11:20 AM

## 2019-12-22 ENCOUNTER — Inpatient Hospital Stay (HOSPITAL_COMMUNITY): Payer: Self-pay

## 2019-12-22 ENCOUNTER — Encounter (HOSPITAL_COMMUNITY): Payer: Self-pay | Admitting: Family Medicine

## 2019-12-22 ENCOUNTER — Encounter (HOSPITAL_COMMUNITY)
Admission: AD | Disposition: A | Discharge status: Home / Self Care | Payer: Self-pay | Source: Other Acute Inpatient Hospital | Attending: Internal Medicine

## 2019-12-22 ENCOUNTER — Inpatient Hospital Stay (HOSPITAL_COMMUNITY): Payer: Self-pay | Admitting: Certified Registered"

## 2019-12-22 HISTORY — PX: ANTERIOR CERVICAL DECOMP/DISCECTOMY FUSION: SHX1161

## 2019-12-22 LAB — BASIC METABOLIC PANEL
Anion gap: 10 (ref 5–15)
BUN: 13 mg/dL (ref 6–20)
CO2: 22 mmol/L (ref 22–32)
Calcium: 8.9 mg/dL (ref 8.9–10.3)
Chloride: 106 mmol/L (ref 98–111)
Creatinine, Ser: 1.03 mg/dL (ref 0.61–1.24)
GFR calc Af Amer: >60 mL/min (ref >60)
GFR calc non Af Amer: >60 mL/min (ref >60)
Glucose, Bld: 106 mg/dL — ABNORMAL HIGH (ref 70–99)
Potassium: 3.8 mmol/L (ref 3.5–5.1)
Sodium: 138 mmol/L (ref 135–145)

## 2019-12-22 LAB — CBC
HCT: 34 % — ABNORMAL LOW (ref 39.0–52.0)
Hemoglobin: 11.1 g/dL — ABNORMAL LOW (ref 13.0–17.0)
MCH: 26.2 pg (ref 26.0–34.0)
MCHC: 32.6 g/dL (ref 30.0–36.0)
MCV: 80.4 fL (ref 80.0–100.0)
Platelets: 251 10*3/uL (ref 150–400)
RBC: 4.23 MIL/uL (ref 4.22–5.81)
RDW: 13.6 % (ref 11.5–15.5)
WBC: 5.6 10*3/uL (ref 4.0–10.5)
nRBC: 0 % (ref 0.0–0.2)

## 2019-12-22 LAB — CULTURE, BLOOD (ROUTINE X 2): Special Requests: ADEQUATE

## 2019-12-22 LAB — HEPATITIS B SURFACE ANTIGEN: Hepatitis B Surface Ag: NEGATIVE — AB

## 2019-12-22 LAB — MAGNESIUM: Magnesium: 1.8 mg/dL (ref 1.7–2.4)

## 2019-12-22 LAB — HEPATITIS B SURFACE ANTIBODY, QUANTITATIVE: Hep B S AB Quant (Post): 17.2 m[IU]/mL (ref >9.9)

## 2019-12-22 SURGERY — ANTERIOR CERVICAL DECOMPRESSION/DISCECTOMY FUSION 2 LEVEL/HARDWARE REMOVAL
Anesthesia: General | Site: Neck

## 2019-12-22 MED ORDER — 0.9 % SODIUM CHLORIDE (POUR BTL) OPTIME
TOPICAL | Status: DC | PRN
  Administered 2019-12-22: 1000 mL

## 2019-12-22 MED ORDER — MIDAZOLAM HCL 5 MG/5ML IJ SOLN
INTRAMUSCULAR | Status: DC | PRN
  Administered 2019-12-22: 2 mg via INTRAVENOUS

## 2019-12-22 MED ORDER — SODIUM CHLORIDE 0.9 % IV SOLN
250.0000 mL | INTRAVENOUS | Status: DC
  Administered 2019-12-22: 23:00:00 250 mL via INTRAVENOUS

## 2019-12-22 MED ORDER — ONDANSETRON HCL 4 MG/2ML IJ SOLN
INTRAMUSCULAR | Status: AC
  Filled 2019-12-22: qty 2

## 2019-12-22 MED ORDER — THROMBIN 5000 UNITS EX SOLR
CUTANEOUS | Status: DC | PRN
  Administered 2019-12-22 (×2): 5000 [IU] via TOPICAL

## 2019-12-22 MED ORDER — ACETAMINOPHEN 160 MG/5ML PO SOLN: 1000.0000 mg | Freq: Once | ORAL | Status: DC | PRN

## 2019-12-22 MED ORDER — SUCCINYLCHOLINE CHLORIDE 200 MG/10ML IV SOSY
PREFILLED_SYRINGE | INTRAVENOUS | Status: AC
  Filled 2019-12-22: qty 10

## 2019-12-22 MED ORDER — FENTANYL CITRATE (PF) 100 MCG/2ML IJ SOLN
INTRAMUSCULAR | Status: DC | PRN
  Administered 2019-12-22: 50 ug via INTRAVENOUS
  Administered 2019-12-22: 150 ug via INTRAVENOUS
  Administered 2019-12-22: 50 ug via INTRAVENOUS

## 2019-12-22 MED ORDER — FENTANYL CITRATE (PF) 100 MCG/2ML IJ SOLN
INTRAMUSCULAR | Status: AC
  Filled 2019-12-22: qty 2

## 2019-12-22 MED ORDER — ACETAMINOPHEN 500 MG PO TABS: 1000.0000 mg | ORAL_TABLET | Freq: Once | ORAL | Status: DC | PRN

## 2019-12-22 MED ORDER — HYDROCODONE-ACETAMINOPHEN 5-325 MG PO TABS
1.0000 | ORAL_TABLET | ORAL | Status: DC | PRN
  Administered 2019-12-28 – 2020-01-22 (×23): 1 via ORAL
  Filled 2019-12-22 (×24): qty 1

## 2019-12-22 MED ORDER — FENTANYL CITRATE (PF) 100 MCG/2ML IJ SOLN
25.0000 ug | INTRAMUSCULAR | Status: DC | PRN
  Administered 2019-12-22 (×3): 50 ug via INTRAVENOUS
  Administered 2019-12-22 (×2): 25 ug via INTRAVENOUS

## 2019-12-22 MED ORDER — THROMBIN 5000 UNITS EX SOLR
CUTANEOUS | Status: AC
  Filled 2019-12-22: qty 15000

## 2019-12-22 MED ORDER — DIPHENHYDRAMINE HCL 50 MG/ML IJ SOLN
INTRAMUSCULAR | Status: AC
  Filled 2019-12-22: qty 1

## 2019-12-22 MED ORDER — FENTANYL CITRATE (PF) 250 MCG/5ML IJ SOLN
INTRAMUSCULAR | Status: AC
  Filled 2019-12-22: qty 5

## 2019-12-22 MED ORDER — DEXAMETHASONE SODIUM PHOSPHATE 10 MG/ML IJ SOLN
INTRAMUSCULAR | Status: AC
  Filled 2019-12-22: qty 1

## 2019-12-22 MED ORDER — MIDAZOLAM HCL 2 MG/2ML IJ SOLN
INTRAMUSCULAR | Status: AC
  Filled 2019-12-22: qty 2

## 2019-12-22 MED ORDER — OXYCODONE HCL 5 MG PO TABS: 5.0000 mg | ORAL_TABLET | Freq: Once | ORAL | Status: DC | PRN

## 2019-12-22 MED ORDER — DEXMEDETOMIDINE HCL IN NACL 80 MCG/20ML IV SOLN
INTRAVENOUS | Status: AC
  Administered 2019-12-22: 21:00:00 40 ug
  Filled 2019-12-22: qty 20

## 2019-12-22 MED ORDER — SODIUM CHLORIDE 0.9 % IV SOLN
INTRAVENOUS | Status: DC | PRN
  Administered 2019-12-22: 500 mL

## 2019-12-22 MED ORDER — EPHEDRINE SULFATE 50 MG/ML IJ SOLN
INTRAMUSCULAR | Status: DC | PRN
  Administered 2019-12-22: 5 mg via INTRAVENOUS

## 2019-12-22 MED ORDER — DEXMEDETOMIDINE HCL 200 MCG/2ML IV SOLN: 40.0000 ug | Freq: Once | INTRAVENOUS | Status: AC

## 2019-12-22 MED ORDER — ONDANSETRON HCL 4 MG/2ML IJ SOLN
INTRAMUSCULAR | Status: DC | PRN
  Administered 2019-12-22: 4 mg via INTRAVENOUS

## 2019-12-22 MED ORDER — SODIUM CHLORIDE 0.9% FLUSH
3.0000 mL | INTRAVENOUS | Status: DC | PRN
  Administered 2019-12-22 – 2019-12-25 (×2): 3 mL via INTRAVENOUS

## 2019-12-22 MED ORDER — DIPHENHYDRAMINE HCL 50 MG/ML IJ SOLN
INTRAMUSCULAR | Status: DC | PRN
  Administered 2019-12-22: 12.5 mg via INTRAVENOUS

## 2019-12-22 MED ORDER — PHENYLEPHRINE HCL (PRESSORS) 10 MG/ML IV SOLN
INTRAVENOUS | Status: DC | PRN
  Administered 2019-12-22 (×2): 80 ug via INTRAVENOUS

## 2019-12-22 MED ORDER — PROPOFOL 10 MG/ML IV BOLUS
INTRAVENOUS | Status: AC
  Filled 2019-12-22: qty 20

## 2019-12-22 MED ORDER — CYCLOBENZAPRINE HCL 10 MG PO TABS
10.0000 mg | ORAL_TABLET | Freq: Three times a day (TID) | ORAL | Status: DC | PRN
  Administered 2019-12-22 – 2020-01-26 (×18): 10 mg via ORAL
  Filled 2019-12-22 (×19): qty 1

## 2019-12-22 MED ORDER — CEFAZOLIN SODIUM-DEXTROSE 2-4 GM/100ML-% IV SOLN
INTRAVENOUS | Status: AC
  Filled 2019-12-22: qty 100

## 2019-12-22 MED ORDER — MENTHOL 3 MG MT LOZG: 1.0000 | LOZENGE | OROMUCOSAL | Status: DC | PRN

## 2019-12-22 MED ORDER — ACETAMINOPHEN 10 MG/ML IV SOLN
1000.0000 mg | Freq: Once | INTRAVENOUS | Status: DC | PRN
  Administered 2019-12-22: 21:00:00 1000 mg via INTRAVENOUS

## 2019-12-22 MED ORDER — HEMOSTATIC AGENTS (NO CHARGE) OPTIME
TOPICAL | Status: DC | PRN
  Administered 2019-12-22: 1 via TOPICAL

## 2019-12-22 MED ORDER — ACETAMINOPHEN 10 MG/ML IV SOLN
INTRAVENOUS | Status: AC
  Filled 2019-12-22: qty 100

## 2019-12-22 MED ORDER — MIDAZOLAM HCL 2 MG/2ML IJ SOLN
1.0000 mg | INTRAMUSCULAR | Status: AC | PRN
  Administered 2019-12-22 (×2): 1 mg via INTRAVENOUS

## 2019-12-22 MED ORDER — LACTATED RINGERS IV SOLN: INTRAVENOUS | Status: DC

## 2019-12-22 MED ORDER — ROCURONIUM BROMIDE 10 MG/ML (PF) SYRINGE
PREFILLED_SYRINGE | INTRAVENOUS | Status: AC
  Filled 2019-12-22: qty 10

## 2019-12-22 MED ORDER — ONDANSETRON HCL 4 MG/2ML IJ SOLN: 4.0000 mg | Freq: Four times a day (QID) | INTRAMUSCULAR | Status: DC | PRN

## 2019-12-22 MED ORDER — HYDROCODONE-ACETAMINOPHEN 10-325 MG PO TABS
2.0000 | ORAL_TABLET | ORAL | Status: DC | PRN
  Administered 2019-12-22 – 2020-01-22 (×60): 2 via ORAL
  Filled 2019-12-22 (×61): qty 2

## 2019-12-22 MED ORDER — PHENOL 1.4 % MT LIQD: 1.0000 | OROMUCOSAL | Status: DC | PRN

## 2019-12-22 MED ORDER — DEXAMETHASONE SODIUM PHOSPHATE 10 MG/ML IJ SOLN
INTRAMUSCULAR | Status: DC | PRN
  Administered 2019-12-22: 10 mg via INTRAVENOUS

## 2019-12-22 MED ORDER — LIDOCAINE 2% (20 MG/ML) 5 ML SYRINGE
INTRAMUSCULAR | Status: DC | PRN
  Administered 2019-12-22: 100 mg via INTRAVENOUS

## 2019-12-22 MED ORDER — ONDANSETRON HCL 4 MG PO TABS: 4.0000 mg | ORAL_TABLET | Freq: Four times a day (QID) | ORAL | Status: DC | PRN

## 2019-12-22 MED ORDER — OXYCODONE HCL 5 MG/5ML PO SOLN: 5.0000 mg | Freq: Once | ORAL | Status: DC | PRN

## 2019-12-22 MED ORDER — ROCURONIUM BROMIDE 50 MG/5ML IV SOSY
PREFILLED_SYRINGE | INTRAVENOUS | Status: DC | PRN
  Administered 2019-12-22: 60 mg via INTRAVENOUS
  Administered 2019-12-22: 20 mg via INTRAVENOUS

## 2019-12-22 MED ORDER — LIDOCAINE 2% (20 MG/ML) 5 ML SYRINGE
INTRAMUSCULAR | Status: AC
  Filled 2019-12-22: qty 5

## 2019-12-22 MED ORDER — ARTIFICIAL TEARS OPHTHALMIC OINT
TOPICAL_OINTMENT | OPHTHALMIC | Status: AC
  Filled 2019-12-22: qty 3.5

## 2019-12-22 MED ORDER — SODIUM CHLORIDE 0.9% FLUSH
3.0000 mL | Freq: Two times a day (BID) | INTRAVENOUS | Status: DC
  Administered 2019-12-22 – 2019-12-28 (×13): 3 mL via INTRAVENOUS

## 2019-12-22 MED ORDER — PROPOFOL 10 MG/ML IV BOLUS
INTRAVENOUS | Status: DC | PRN
  Administered 2019-12-22: 160 mg via INTRAVENOUS

## 2019-12-22 MED ORDER — HYDROMORPHONE HCL 1 MG/ML IJ SOLN
1.0000 mg | INTRAMUSCULAR | Status: DC | PRN
  Administered 2019-12-22 – 2019-12-23 (×2): 1 mg via INTRAVENOUS
  Filled 2019-12-22 (×2): qty 1

## 2019-12-22 MED ORDER — EPHEDRINE 5 MG/ML INJ
INTRAVENOUS | Status: AC
  Filled 2019-12-22: qty 10

## 2019-12-22 MED ORDER — SUGAMMADEX SODIUM 200 MG/2ML IV SOLN
INTRAVENOUS | Status: DC | PRN
  Administered 2019-12-22: 200 mg via INTRAVENOUS

## 2019-12-22 MED ORDER — PHENYLEPHRINE 40 MCG/ML (10ML) SYRINGE FOR IV PUSH (FOR BLOOD PRESSURE SUPPORT)
PREFILLED_SYRINGE | INTRAVENOUS | Status: AC
  Filled 2019-12-22: qty 10

## 2019-12-22 SURGICAL SUPPLY — 63 items
BAG DECANTER FOR FLEXI CONT (MISCELLANEOUS) ×3 IMPLANT
BENZOIN TINCTURE PRP APPL 2/3 (GAUZE/BANDAGES/DRESSINGS) ×3 IMPLANT
BIT DRILL 13 (BIT) ×1 IMPLANT
BIT DRILL 13MM (BIT) ×1
BUR MATCHSTICK NEURO 3.0 LAGG (BURR) ×3 IMPLANT
CANISTER SUCT 3000ML PPV (MISCELLANEOUS) ×3 IMPLANT
CARTRIDGE OIL MAESTRO DRILL (MISCELLANEOUS) ×1 IMPLANT
CLOSURE STERI-STRIP 1/2X4 (GAUZE/BANDAGES/DRESSINGS) ×1
CLOSURE WOUND 1/2 X4 (GAUZE/BANDAGES/DRESSINGS) ×1
CLSR STERI-STRIP ANTIMIC 1/2X4 (GAUZE/BANDAGES/DRESSINGS) ×1 IMPLANT
COVER WAND RF STERILE (DRAPES) ×3 IMPLANT
DIFFUSER DRILL AIR PNEUMATIC (MISCELLANEOUS) ×3 IMPLANT
DRAPE C-ARM 42X72 X-RAY (DRAPES) ×6 IMPLANT
DRAPE LAPAROTOMY 100X72 PEDS (DRAPES) ×3 IMPLANT
DRAPE MICROSCOPE LEICA (MISCELLANEOUS) ×3 IMPLANT
DRSG OPSITE POSTOP 4X6 (GAUZE/BANDAGES/DRESSINGS) ×2 IMPLANT
DURAPREP 6ML APPLICATOR 50/CS (WOUND CARE) ×3 IMPLANT
ELECT COATED BLADE 2.86 ST (ELECTRODE) ×3 IMPLANT
ELECT REM PT RETURN 9FT ADLT (ELECTROSURGICAL) ×3
ELECTRODE REM PT RTRN 9FT ADLT (ELECTROSURGICAL) ×1 IMPLANT
EVACUATOR 1/8 PVC DRAIN (DRAIN) ×2 IMPLANT
GAUZE 4X4 16PLY RFD (DISPOSABLE) IMPLANT
GAUZE SPONGE 4X4 12PLY STRL (GAUZE/BANDAGES/DRESSINGS) ×3 IMPLANT
GLOVE BIO SURGEON STRL SZ 6.5 (GLOVE) ×3 IMPLANT
GLOVE BIO SURGEON STRL SZ7 (GLOVE) ×2 IMPLANT
GLOVE BIO SURGEONS STRL SZ 6.5 (GLOVE) ×3
GLOVE BIOGEL PI IND STRL 6.5 (GLOVE) IMPLANT
GLOVE BIOGEL PI INDICATOR 6.5 (GLOVE) ×2
GLOVE ECLIPSE 9.0 STRL (GLOVE) ×3 IMPLANT
GLOVE EXAM NITRILE XL STR (GLOVE) IMPLANT
GOWN STRL REUS W/ TWL LRG LVL3 (GOWN DISPOSABLE) IMPLANT
GOWN STRL REUS W/ TWL XL LVL3 (GOWN DISPOSABLE) IMPLANT
GOWN STRL REUS W/TWL 2XL LVL3 (GOWN DISPOSABLE) IMPLANT
GOWN STRL REUS W/TWL LRG LVL3 (GOWN DISPOSABLE) ×4
GOWN STRL REUS W/TWL XL LVL3 (GOWN DISPOSABLE)
HALTER HD/CHIN CERV TRACTION D (MISCELLANEOUS) ×3 IMPLANT
HEMOSTAT POWDER KIT SURGIFOAM (HEMOSTASIS) IMPLANT
HEMOSTAT SURGICEL 2X14 (HEMOSTASIS) IMPLANT
KIT BASIN OR (CUSTOM PROCEDURE TRAY) ×3 IMPLANT
KIT TURNOVER KIT B (KITS) ×3 IMPLANT
NDL SPNL 20GX3.5 QUINCKE YW (NEEDLE) ×1 IMPLANT
NEEDLE SPNL 20GX3.5 QUINCKE YW (NEEDLE) ×3 IMPLANT
NS IRRIG 1000ML POUR BTL (IV SOLUTION) ×3 IMPLANT
OIL CARTRIDGE MAESTRO DRILL (MISCELLANEOUS) ×3
PACK LAMINECTOMY NEURO (CUSTOM PROCEDURE TRAY) ×3 IMPLANT
PAD ARMBOARD 7.5X6 YLW CONV (MISCELLANEOUS) ×9 IMPLANT
PLATE ELITE 42MM (Plate) ×2 IMPLANT
RUBBERBAND STERILE (MISCELLANEOUS) ×6 IMPLANT
SCREW ST 13X4XST VA NS SPNE (Screw) IMPLANT
SCREW ST VAR 4 ATL (Screw) ×12 IMPLANT
SPACER BONE CORNERSTONE 6X14 (Orthopedic Implant) ×2 IMPLANT
SPACER BONE CORNERSTONE 7X14 (Orthopedic Implant) ×2 IMPLANT
SPACER BONE CORNERSTONE 9X14 (Block) ×2 IMPLANT
SPONGE INTESTINAL PEANUT (DISPOSABLE) ×3 IMPLANT
SPONGE SURGIFOAM ABS GEL SZ50 (HEMOSTASIS) IMPLANT
STRIP CLOSURE SKIN 1/2X4 (GAUZE/BANDAGES/DRESSINGS) ×2 IMPLANT
SUT VIC AB 3-0 SH 8-18 (SUTURE) ×3 IMPLANT
SUT VIC AB 4-0 RB1 18 (SUTURE) ×3 IMPLANT
TAPE CLOTH 4X10 WHT NS (GAUZE/BANDAGES/DRESSINGS) ×3 IMPLANT
TOWEL GREEN STERILE (TOWEL DISPOSABLE) ×3 IMPLANT
TOWEL GREEN STERILE FF (TOWEL DISPOSABLE) ×3 IMPLANT
TRAP SPECIMEN MUCOUS 40CC (MISCELLANEOUS) ×3 IMPLANT
WATER STERILE IRR 1000ML POUR (IV SOLUTION) ×3 IMPLANT

## 2019-12-22 NOTE — Anesthesia Preprocedure Evaluation (Addendum)
Anesthesia Evaluation  Patient identified by MRN, date of birth, ID band Patient awake    Reviewed: Allergy & Precautions, NPO status , Patient's Chart, lab work & pertinent test results  Airway Mallampati: II  TM Distance: >3 FB Neck ROM: Full    Dental  (+) Poor Dentition, Dental Advisory Given   Pulmonary neg recent URI, Current Smoker and Patient abstained from smoking.,    breath sounds clear to auscultation       Cardiovascular negative cardio ROS   Rhythm:Regular     Neuro/Psych Cervical osteomyelitis discitis with epidural abscess negative psych ROS   GI/Hepatic negative GI ROS, Neg liver ROS,   Endo/Other  negative endocrine ROS  Renal/GU negative Renal ROS     Musculoskeletal   Abdominal   Peds  Hematology negative hematology ROS (+)   Anesthesia Other Findings  1. Left ventricular ejection fraction, by estimation, is 65 to 70%. The  left ventricle has normal function. The left ventricle has no regional  wall motion abnormalities. Left ventricular diastolic parameters were  normal.  2. Right ventricular systolic function is normal. The right ventricular  size is normal. There is normal pulmonary artery systolic pressure. The  estimated right ventricular systolic pressure is 28.8 mmHg.  3. The mitral valve is grossly normal. Trivial mitral valve  regurgitation.  4. The aortic valve is tricuspid. Aortic valve regurgitation is not  visualized.  5. The inferior vena cava is normal in size with greater than 50%  respiratory variability, suggesting right atrial pressure of 3 mmHg.  Reproductive/Obstetrics                            Anesthesia Physical Anesthesia Plan  ASA: II  Anesthesia Plan: General   Post-op Pain Management:    Induction: Intravenous  PONV Risk Score and Plan: 1 and Treatment may vary due to age or medical condition, Ondansetron and  Dexamethasone  Airway Management Planned: Oral ETT and Video Laryngoscope Planned  Additional Equipment: None  Intra-op Plan:   Post-operative Plan: Extubation in OR  Informed Consent: I have reviewed the patients History and Physical, chart, labs and discussed the procedure including the risks, benefits and alternatives for the proposed anesthesia with the patient or authorized representative who has indicated his/her understanding and acceptance.     Dental advisory given  Plan Discussed with: CRNA and Surgeon  Anesthesia Plan Comments:         Anesthesia Quick Evaluation

## 2019-12-22 NOTE — Progress Notes (Signed)
Patient back to the unit from OR/PACU. Patient alert and oriented, follows command, but in a very excruciating pain, Patient stated pain of 10 out of 10. Vital signs remains stable, TP=97.3, SBP=110/89, RP=14, SPO298%. PRN pain meds administered. Will continue monitoring

## 2019-12-22 NOTE — Progress Notes (Signed)
No new issues. Pain numbness and weakness persist.   C5/6 and C6/7 radiculopathy secondary to osteomyelitis/discitis. Plan C5/6 C6/7 ACDF.Marland Kitchen

## 2019-12-22 NOTE — Brief Op Note (Signed)
12/22/2019  8:39 PM  PATIENT:  Michael Lester  34 y.o. male  PRE-OPERATIVE DIAGNOSIS:  Cervical osteomyelitis discitis with epidural abscess  POST-OPERATIVE DIAGNOSIS:  Cervical osteomyelitis discitis with epidural abscess  PROCEDURE:  Procedure(s): cervical five-six, cervical six-seven anterior cervical discectomy with interbody fusion (N/A)  SURGEON:  Surgeon(s) and Role:    Julio Sicks, MD - Primary    * Bedelia Person, MD - Assisting  PHYSICIAN ASSISTANT:   ASSISTANTS:    ANESTHESIA:   general  EBL:  100 mL   BLOOD ADMINISTERED:none  DRAINS: (med) Hemovact drain(s) in the prevertebral space with  Suction Open   LOCAL MEDICATIONS USED:  NONE  SPECIMEN:  No Specimen  DISPOSITION OF SPECIMEN:  N/A  COUNTS:  YES  TOURNIQUET:  * No tourniquets in log *  DICTATION: .Dragon Dictation  PLAN OF CARE: Admit to inpatient   PATIENT DISPOSITION:  PACU - hemodynamically stable.   Delay start of Pharmacological VTE agent (>24hrs) due to surgical blood loss or risk of bleeding: yes

## 2019-12-22 NOTE — Op Note (Signed)
Date of procedure: 12/22/2019  Date of dictation: Same  Service: Neurosurgery  Preoperative diagnosis: Cervical osteomyelitis discitis with epidural abscess and myeloradiculopathy  Postoperative diagnosis: Same  Procedure Name: C5-6, C6-7 anterior cervical discectomy with interbody fusion utilizing interbody allograft wedges and anterior plate instrumentation.  Surgeon:Yash Cacciola A.Breck [place], M.D.  Asst. Surgeon: Maisie Fus  Anesthesia: General  Indication: 34 year old male with history of IV drug abuse presents with severe neck and right upper extremity pain.  Work-up consistent with cervical osteomyelitis discitis worse at the C6-7 level with right-sided extension into the anterior epidural space with marked compression of the thecal sac and right C7 nerve root.  Patient also with severe spondylosis and stenosis at C5-6.  Patient presents now for two-level anterior cervical decompression and fusion in hopes of improving his symptoms.  Operative note: After induction of anesthesia, patient positioned supine with neck slightly extended and held placed halter traction.  Patient's anterior cervical region prepped and draped sterilely.  Incision made overlying C6.  Dissection performed on the right side.  Retractor placed.  Fluoroscopy used.  Levels confirmed.  Longus coli muscles and prevertebral tissue was very edematous.  There was some phlegmon change but no liquid abscess.  Cultures were taken and the C6-7 disc space and sent to microbiology.  Displaces at C5-6 and C6-7 were incised.  Discectomy was then performed using various instruments down to level the posterior annulus.  Microscope was then brought to field using microdissection of the spinal canal.  Remaining aspects of annulus and osteophytes removed using high-speed drill.  Posterior logical limb was then elevated and resected piecemeal fashion.  Underlying thecal sac was then identified.  Wide central decompression then performed undercutting the  bodies of C5 and C6.  Decompression then proceeded to each neural foramina.  Wide anterior foraminotomies were performed on the course exiting C6 nerve roots bilaterally.  At this point a very thorough decompression had been achieved.  Discectomy then repeated at C6-7.  At C6-7 on the right side there was significant epidural phlegmon which was dissected free.  Wound is then irrigated with antibiotic solution.  Gelfoam was placed topically for hemostasis then removed.  [employer] allograft wedges were then impacted in the place and recessed slightly from the anterior cortical margin at both C5-6 and C6-7.  Atlantis anterior cervical plate was then placed over the C5, C6 and C7 levels.  This then attached under fluoroscopic guidance using 13 mm variable angle screws to each at all 3 levels.  All screws given final tightening found to be solidly within the bone.  Locking screws engaged.  Final images reveal good position of the cages and the hardware at the proper operative level with normal alignment of spine.  Wound is then inspected for hemostasis.  A medium Hemovac drain was left in the prevertebral space.  Wound is then closed in layers with Vicryl sutures.  Steri-Strips and sterile dressing were applied.  No apparent complications.  Patient tolerated the procedure well and he returns to recovery room postop.Marland Kitchen

## 2019-12-22 NOTE — Progress Notes (Signed)
PROGRESS NOTE    Michael Lester    Code Status: Full Code  RDE:[id number] DOB: Nov 29, 1985 DOA: 12/19/2019 LOS: 3 days  PCP: Patient, [name] CC: No chief complaint on file.      Hospital Summary  Michael Lester is a 34 y.o. male with a history of IV drug use who initially presented to [facility] after being found unresponsive and complaining of headache and neck pain x2 weeks at the base of his skull radiating to right shoulder with associated paresthesias of right first, second and third fingers as well as recurrent syncopal episodes over the same interval.  At [facility], he was found to be afebrile, saturating well on room air, tachycardic as high as 160s, and with stable blood pressure.    Sinus tach on EKG, RSR' in V1 and suspected LVH with anterior ST elevation.  Chest x-ray is negative for acute cardiopulmonary disease.  Cr 1.37, and glucose of 190.    WBC 14,400 and mild normocytic anemia.  Lactic acid is reassuringly normal.  CT head is negative for acute intracranial abnormality and CT cervical spine is negative for acute fracture but features an abnormal lucency at the adjacent endplates of C6-7.  This was followed by MRI cervical spine which is concerning for discitis osteomyelitis at C6-7 with prevertebral edema and phlegmon resulting in spinal stenosis and cord mass-effect without definite cord signal abnormality and without drainable fluid collection. 2 L of lactated Ringer's were administered, and the patient was treated with cefepime, vancomycin, and Flagyl.  Neurosurgery at [facility] was consulted by the ED physician, did not anticipate any urgent surgical intervention but recommended admission to the hospitalist service at [facility] for ongoing treatment.   Blood cultures positive for 1 of 2 methicillin-resistant coag negative staph likely contaminant,.  ID consulted.  Neurosurgery consulted as well.  A & P   Principal Problem:   Osteomyelitis of C6-7 Active Problems:  Injection of illicit drug within last 12 months   Syncope   Abscess in epidural space of cervical spine   Opioid dependence, daily use (HCC)   Positive hepatitis C antibody test   1. C6-7 discitis/osteomyelitis with RUE paresthesias/weak grip strength a. Symptomatically worse today after 'moving wrong' last night b. Blood culture positive for 1 of 2 methicillin-resistant coag negative staph, likely contaminant. Repeat cultures with no growth to date c. ID consulted: Vancomycin/ceftriaxone for now.  Plan for 6 to 8 weeks IV antibiotics ideally. Holding PICC for now d. Echo unremarkable e. Neurosurgery: OR this evening with cultures. 2. Methicillin-resistant coag negative staph bacteremia, likely contaminant a. Follow-up repeat blood cultures and ID recommendations 3. Recurrent syncope, unknown etiology likely related to above a. Sinus tachycardia on EKG and likely LVH, not concerning for STEMI on personal read b. Echo unremarkable c. PT/OT and further workup after surgery 4. Newly diagnosed HCV a. HCV Ab + b. Follow up RNA quantity and genotype 5. Hypomagnesemia resolved 6. Normocytic anemia, likely ACD a. Hb stable at 11 7. IV drug use, daily heroin use a. Plans to stop using.  Glad he is in the hospital to help with his detox b. Patient denies withdrawal symptoms at this point c. SW on board  DVT prophylaxis: SCDs, Lovenox Family Communication: Patient updated at bedside Disposition Plan:   Patient came from:   Home  Anticipated d/c place: TBD  Barriers to d/c: Medical stability, further infectious workup  Pressure injury documentation    None  Consultants  Infectious disease Neurosurgery  Procedures  None  Antibiotics   Anti-infectives (From admission, onward)   Start     Dose/Rate Route Frequency Ordered Stop   12/22/19 1000  vancomycin (VANCOREADY) IVPB 1250 mg/250 mL      1,250 mg 166.7 mL/hr over 90 Minutes Intravenous Every 12 hours 12/21/19 2301     12/20/19 0600  vancomycin (VANCOREADY) IVPB 1750 mg/350 mL  Status:  Discontinued     1,750 mg 175 mL/hr over 120 Minutes Intravenous Every 12 hours 12/20/19 0042 12/21/19 2301   12/20/19 0030  cefTRIAXone (ROCEPHIN) 2 g in sodium chloride 0.9 % 100 mL IVPB     2 g 200 mL/hr over 30 Minutes Intravenous Every 24 hours 12/20/19 0027          Subjective   Reported that he "moved wrong last night "and this led to worsening paresthesias of right upper extremity now up to mid forearm.  Having pain in his right shoulder which is worse today leading to decreased ROM.  Pain medication does help but only last for a few hours.  Appears in discomfort.  No other complaints at this point Objective   Vitals:   12/21/19 2354 12/22/19 0428 12/22/19 0741 12/22/19 1226  BP: 117/79 131/80 103/63 120/78  Pulse: 64 60 (!) 57 60  Resp: 16 16 20 17   Temp: 98.3 F (36.8 C) 98.6 F (37 C) 98 F (36.7 C) 97.6 F (36.4 C)  TempSrc: Oral Oral  Oral  SpO2: 97% 98% 98% 99%  Weight:      Height:        Intake/Output Summary (Last 24 hours) at 12/22/2019 1531 Last data filed at 12/22/2019 1250 Gross per 24 hour  Intake 243 ml  Output 1575 ml  Net -1332 ml   Filed Weights   12/20/19 0135  Weight: 71.5 kg    Examination:  Physical Exam Constitutional:      Appearance: He is ill-appearing.     Comments: Appears in discomfort  HENT:     Head: Normocephalic.     Mouth/Throat:     Mouth: Mucous membranes are moist.  Cardiovascular:     Rate and Rhythm: Normal rate and regular rhythm.  Pulmonary:     Effort: Pulmonary effort is normal.     Breath sounds: Normal breath sounds.  Abdominal:     General: Abdomen is flat.     Palpations: Abdomen is soft.  Musculoskeletal:        General: No swelling.     Comments: Decreased ROM of right shoulder  Neurological:     Sensory: Sensory deficit present.     Comments:  Paresthesias of anterior and posterior RUE from about elbow to fingertips  Psychiatric:        Mood and Affect: Mood normal.        Behavior: Behavior normal.     Data Reviewed: I have personally reviewed following labs and imaging studies  CBC: Recent Labs  Lab 12/19/19 1515 12/20/19 0439 12/20/19 1314 12/21/19 0452 12/22/19 0650  WBC 14.4* 6.1 5.7 5.7 5.6  NEUTROABS 12.6* 3.6  --   --   --   HGB 11.3* 9.8* 11.1* 11.1* 11.1*  HCT 35.1* 29.9* 34.0* 33.3* 34.0*  MCV 81.6 81.3 80.6 79.3* 80.4  PLT 325 225 241 274 251   Basic Metabolic Panel: Recent Labs  Lab 12/19/19 1515 12/20/19 0439 12/20/19 1314 12/21/19 0452 12/22/19 0650  NA 138 137  --  137 138  K 4.7 3.8  --  3.6 3.8  CL 105 103  --  102 106  CO2 24 26  --  24 22  GLUCOSE 195* 88  --  125* 106*  BUN 23* 16  --  12 13  CREATININE 1.19 0.99 1.08 0.95 1.03  CALCIUM 9.0 8.7*  --  8.6* 8.9  MG  --  1.6*  --  2.0 1.8   GFR: Estimated Creatinine Clearance: 101.2 mL/min (by C-G formula based on SCr of 1.03 mg/dL). Liver Function Tests: Recent Labs  Lab 12/19/19 1515 12/20/19 0439  AST 17 10*  ALT 15 13  ALKPHOS 112 89  BILITOT 0.2* 0.6  PROT 8.4* 6.2*  ALBUMIN 3.6 2.6*   No results for input(s): LIPASE, AMYLASE in the last 168 hours. No results for input(s): AMMONIA in the last 168 hours. Coagulation Profile: Recent Labs  Lab 12/19/19 1743  INR 1.2   Cardiac Enzymes: Recent Labs  Lab 12/19/19 1515  CKTOTAL 86   BNP (last 3 results) No results for input(s): PROBNP in the last 8760 hours. HbA1C: No results for input(s): HGBA1C in the last 72 hours. CBG: No results for input(s): GLUCAP in the last 168 hours. Lipid Profile: No results for input(s): CHOL, HDL, LDLCALC, TRIG, CHOLHDL, LDLDIRECT in the last 72 hours. Thyroid Function Tests: No results for input(s): TSH, T4TOTAL, FREET4, T3FREE, THYROIDAB in the last 72 hours. Anemia Panel: Recent Labs    12/21/19 0452  FERRITIN 72  TIBC 342    IRON 38*   Sepsis Labs: Recent Labs  Lab 12/19/19 1541  LATICACIDVEN 1.2    Recent Results (from the past 240 hour(s))  Culture, blood (routine x 2)     Status: None (Preliminary result)   Collection Time: 12/19/19  3:41 PM   Specimen: BLOOD  Result Value Ref Range Status   Specimen Description BLOOD  R FOREARM  Final   Special Requests   Final    BOTTLES DRAWN AEROBIC AND ANAEROBIC Blood Culture adequate volume   Culture   Final    NO GROWTH 3 DAYS Performed at [facility], [address] [place], [place] [address]    Report Status PENDING  Incomplete  Culture, blood (routine x 2)     Status: Abnormal   Collection Time: 12/19/19  3:41 PM   Specimen: BLOOD  Result Value Ref Range Status   Specimen Description BLOOD  L FOREARM  Final   Special Requests   Final    BOTTLES DRAWN AEROBIC AND ANAEROBIC Blood Culture adequate volume   Culture  Setup Time   Final    GRAM POSITIVE COCCI IN BOTH AEROBIC AND ANAEROBIC BOTTLES CRITICAL RESULT CALLED TO, READ BACK BY AND VERIFIED WITH: JEENA PIGG AND V BRYAK AT 0719 ON 12/20/19 SNG    Culture (A)  Final    STAPHYLOCOCCUS SPECIES (COAGULASE NEGATIVE) THE SIGNIFICANCE OF ISOLATING THIS ORGANISM FROM A SINGLE SET OF BLOOD CULTURES WHEN MULTIPLE SETS ARE DRAWN IS UNCERTAIN. PLEASE NOTIFY THE MICROBIOLOGY DEPARTMENT WITHIN ONE WEEK IF SPECIATION AND SENSITIVITIES ARE REQUIRED.    Report Status 12/21/2019 FINAL  Final  Blood Culture ID Panel (Reflexed)     Status: Abnormal   Collection Time: 12/19/19  3:41 PM  Result Value Ref Range Status   Enterococcus species NOT DETECTED NOT DETECTED Final   Listeria monocytogenes NOT DETECTED NOT DETECTED  Final   Staphylococcus species DETECTED (A) NOT DETECTED Final    Comment: Methicillin (oxacillin) resistant coagulase negative staphylococcus. Possible blood culture contaminant (unless isolated from more than one blood culture draw or clinical case suggests pathogenicity). No  antibiotic treatment is indicated for blood  culture contaminants. CRITICAL RESULT CALLED TO, READ BACK BY AND VERIFIED WITH: KAREN PATTON AT 0944 ON 12/20/19 SNG    Staphylococcus aureus (BCID) NOT DETECTED NOT DETECTED Final   Methicillin resistance DETECTED (A) NOT DETECTED Final    Comment: CRITICAL RESULT CALLED TO, READ BACK BY AND VERIFIED WITH: KAREN PATTON AT 0944 ON 12/20/19 SNG    Streptococcus species NOT DETECTED NOT DETECTED Final   Streptococcus agalactiae NOT DETECTED NOT DETECTED Final   Streptococcus pneumoniae NOT DETECTED NOT DETECTED Final   Streptococcus pyogenes NOT DETECTED NOT DETECTED Final   Acinetobacter baumannii NOT DETECTED NOT DETECTED Final   Enterobacteriaceae species NOT DETECTED NOT DETECTED Final   Enterobacter cloacae complex NOT DETECTED NOT DETECTED Final   Escherichia coli NOT DETECTED NOT DETECTED Final   Klebsiella oxytoca NOT DETECTED NOT DETECTED Final   Klebsiella pneumoniae NOT DETECTED NOT DETECTED Final   Proteus species NOT DETECTED NOT DETECTED Final   Serratia marcescens NOT DETECTED NOT DETECTED Final   Haemophilus influenzae NOT DETECTED NOT DETECTED Final   Neisseria meningitidis NOT DETECTED NOT DETECTED Final   Pseudomonas aeruginosa NOT DETECTED NOT DETECTED Final   Candida albicans NOT DETECTED NOT DETECTED Final   Candida glabrata NOT DETECTED NOT DETECTED Final   Candida krusei NOT DETECTED NOT DETECTED Final   Candida parapsilosis NOT DETECTED NOT DETECTED Final   Candida tropicalis NOT DETECTED NOT DETECTED Final    Comment: Performed at [facility], [address] [place], [place] [address]  Respiratory Panel by RT PCR (Flu A&B, Covid) - Nasopharyngeal Swab     Status: None   Collection Time: 12/19/19  5:43 PM   Specimen: Nasopharyngeal Swab  Result Value Ref Range Status   SARS Coronavirus 2 by RT PCR NEGATIVE NEGATIVE Final    Comment: (NOTE) SARS-CoV-2 target nucleic acids are NOT DETECTED. The  SARS-CoV-2 RNA is generally detectable in upper respiratoy specimens during the acute phase of infection. The lowest concentration of SARS-CoV-2 viral copies this assay can detect is 131 copies/mL. A negative result does not preclude SARS-Cov-2 infection and should not be used as the sole basis for treatment or other patient management decisions. A negative result may occur with  improper specimen collection/handling, submission of specimen other than nasopharyngeal swab, presence of viral mutation(s) within the areas targeted by this assay, and inadequate number of viral copies (<131 copies/mL). A negative result must be combined with clinical observations, patient history, and epidemiological information. The expected result is Negative. Fact Sheet for Patients:  https://www.moore.com/ Fact Sheet for Healthcare Providers:  https://www.young.biz/ This test is not yet ap proved or cleared by the [place] FDA and  has been authorized for detection and/or diagnosis of SARS-CoV-2 by FDA under an Emergency Use Authorization (EUA). This EUA will remain  in effect (meaning this test can be used) for the duration of the COVID-19 declaration under Section 564(b)(1) of the Act, 21 U.S.C. section 360bbb-3(b)(1), unless the authorization is terminated or revoked sooner.    Influenza A by PCR NEGATIVE NEGATIVE Final   Influenza B by PCR NEGATIVE NEGATIVE Final    Comment: (NOTE) The Xpert Xpress SARS-CoV-2/FLU/RSV assay is intended as an aid in  the diagnosis of influenza from  Nasopharyngeal swab specimens and  should not be used as a sole basis for treatment. Nasal washings and  aspirates are unacceptable for Xpert Xpress SARS-CoV-2/FLU/RSV  testing. Fact Sheet for Patients: PinkCheek.be Fact Sheet for Healthcare Providers: GravelBags.it This test is not yet approved or cleared by the [place] FDA and  has been authorized for detection and/or diagnosis of SARS-CoV-2 by  FDA under an Emergency Use Authorization (EUA). This EUA will remain  in effect (meaning this test can be used) for the duration of the  Covid-19 declaration under Section 564(b)(1) of the Act, 21  U.S.C. section 360bbb-3(b)(1), unless the authorization is  terminated or revoked. Performed at [facility], [address] [place], [place] [address]   MRSA PCR Screening     Status: None   Collection Time: 12/20/19 12:54 AM   Specimen: Nasal Mucosa; Nasopharyngeal  Result Value Ref Range Status   MRSA by PCR NEGATIVE NEGATIVE Final    Comment:        The GeneXpert MRSA Assay (FDA approved for NASAL specimens only), is one component of a comprehensive MRSA colonization surveillance program. It is not intended to diagnose MRSA infection nor to guide or monitor treatment for MRSA infections. Performed at [facility], [place] [address], [place], [place] [address]   Culture, blood (Routine X 2) w Reflex to ID Panel     Status: None (Preliminary result)   Collection Time: 12/20/19  1:07 PM   Specimen: BLOOD LEFT FOREARM  Result Value Ref Range Status   Specimen Description BLOOD LEFT FOREARM  Final   Special Requests   Final    BOTTLES DRAWN AEROBIC AND ANAEROBIC Blood Culture adequate volume   Culture   Final    NO GROWTH 2 DAYS Performed at [facility], [place] [address] [place], [place] [address]    Report Status PENDING  Incomplete  Culture, blood (Routine X 2) w Reflex to ID Panel     Status: None (Preliminary result)   Collection Time: 12/20/19  1:16 PM   Specimen: BLOOD LEFT FOREARM  Result Value Ref Range Status   Specimen Description BLOOD LEFT FOREARM  Final   Special Requests   Final    BOTTLES DRAWN AEROBIC AND ANAEROBIC Blood Culture adequate volume   Culture   Final    NO GROWTH 2 DAYS Performed at [facility], [place] [address] [place], [place]  [address]    Report Status PENDING  Incomplete         Radiology Studies: ECHOCARDIOGRAM COMPLETE  Result Date: 12/20/2019    ECHOCARDIOGRAM REPORT   Patient Name:   Michael Lester Date of Exam: 12/20/2019 Medical Rec #:  [id number]     Height:       73.0 in Accession #:    [id number]    Weight:       157.6 lb Date of Birth:  04-23-1986     BSA:          1.945 m Patient Age:    33 years      BP:           122/69 mmHg Patient Gender: M             HR:           66 bpm. Exam Location:  [facility] Procedure: 2D Echo, Cardiac Doppler and Color Doppler Indications:    Syncope 780.2 / R55  History:  Patient has no prior history of Echocardiogram examinations.                 Risk Factors:Current Smoker. IV drug abuse.  Sonographer:    Celesta GentileBernard White RCS Referring Phys: [id number] TIMOTHY S OPYD IMPRESSIONS  1. Left ventricular ejection fraction, by estimation, is 65 to 70%. The left ventricle has normal function. The left ventricle has no regional wall motion abnormalities. Left ventricular diastolic parameters were normal.  2. Right ventricular systolic function is normal. The right ventricular size is normal. There is normal pulmonary artery systolic pressure. The estimated right ventricular systolic pressure is 28.8 mmHg.  3. The mitral valve is grossly normal. Trivial mitral valve regurgitation.  4. The aortic valve is tricuspid. Aortic valve regurgitation is not visualized.  5. The inferior vena cava is normal in size with greater than 50% respiratory variability, suggesting right atrial pressure of 3 mmHg. FINDINGS  Left Ventricle: Left ventricular ejection fraction, by estimation, is 65 to 70%. The left ventricle has normal function. The left ventricle has no regional wall motion abnormalities. The left ventricular internal cavity size was normal in size. There is  borderline left ventricular hypertrophy. Left ventricular diastolic parameters were normal. Right Ventricle: The right ventricular size is  normal. No increase in right ventricular wall thickness. Right ventricular systolic function is normal. There is normal pulmonary artery systolic pressure. The tricuspid regurgitant velocity is 2.54 m/s, and  with an assumed right atrial pressure of 3 mmHg, the estimated right ventricular systolic pressure is 28.8 mmHg. Left Atrium: Left atrial size was normal in size. Right Atrium: Right atrial size was normal in size. Pericardium: There is no evidence of pericardial effusion. Mitral Valve: The mitral valve is grossly normal. Trivial mitral valve regurgitation. Tricuspid Valve: The tricuspid valve is grossly normal. Tricuspid valve regurgitation is mild. Aortic Valve: The aortic valve is tricuspid. Aortic valve regurgitation is not visualized. Pulmonic Valve: The pulmonic valve was grossly normal. Pulmonic valve regurgitation is trivial. Aorta: The aortic root is normal in size and structure. Venous: The inferior vena cava is normal in size with greater than 50% respiratory variability, suggesting right atrial pressure of 3 mmHg. IAS/Shunts: No atrial level shunt detected by color flow Doppler.  LEFT VENTRICLE PLAX 2D LVIDd:         5.40 cm      Diastology LVIDs:         2.90 cm      LV e' lateral:   15.20 cm/s LV PW:         1.10 cm      LV E/e' lateral: 5.3 LV IVS:        0.90 cm      LV e' medial:    10.10 cm/s LVOT diam:     2.00 cm      LV E/e' medial:  7.9 LV SV:         75.71 ml LV SV Index:   38.94 LVOT Area:     3.14 cm  LV Volumes (MOD) LV vol d, MOD A2C: 110.0 ml LV vol d, MOD A4C: 93.3 ml LV vol s, MOD A2C: 33.8 ml LV vol s, MOD A4C: 26.0 ml LV SV MOD A2C:     76.2 ml LV SV MOD A4C:     93.3 ml LV SV MOD BP:      75.8 ml RIGHT VENTRICLE RV S prime:     13.90 cm/s TAPSE (M-mode): 2.3 cm LEFT ATRIUM  Index       RIGHT ATRIUM           Index LA diam:        3.00 cm 1.54 cm/m  RA Area:     16.60 cm LA Vol (A2C):   50.1 ml 25.76 ml/m RA Volume:   45.40 ml  23.35 ml/m LA Vol (A4C):   50.2 ml  25.82 ml/m LA Biplane Vol: 51.9 ml 26.69 ml/m  AORTIC VALVE LVOT Vmax:   129.00 cm/s LVOT Vmean:  78.100 cm/s LVOT VTI:    0.241 m  AORTA Ao Root diam: 3.10 cm MITRAL VALVE               TRICUSPID VALVE MV Area (PHT): 4.58 cm    TR Peak grad:   25.8 mmHg MV Decel Time: 166 msec    TR Vmax:        254.00 cm/s MV E velocity: 80.20 cm/s MV A velocity: 58.30 cm/s  SHUNTS MV E/A ratio:  1.38        Systemic VTI:  0.24 m                            Systemic Diam: 2.00 cm Nona Dell MD Electronically signed by Nona Dell MD Signature Date/Time: 12/20/2019/3:48:14 PM    Final         Scheduled Meds: . enoxaparin (LOVENOX) injection  40 mg Subcutaneous Q24H  . gabapentin  100 mg Oral TID  . nicotine  14 mg Transdermal Daily  . sodium chloride flush  3 mL Intravenous Q12H   Continuous Infusions: . cefTRIAXone (ROCEPHIN)  IV 2 g (12/22/19 0031)  . vancomycin 1,250 mg (12/22/19 0956)     Time spent: with over 50% of the time coordinating the patient's care    Jae Dire, DO [facility] Pager [phone]  Call night coverage person covering after 7pm

## 2019-12-22 NOTE — Anesthesia Procedure Notes (Signed)
Procedure Name: Intubation Date/Time: 12/22/2019 6:44 PM Performed by: Laruth Bouchard., CRNA Pre-anesthesia Checklist: Patient identified, Emergency Drugs available, Suction available, Patient being monitored and Timeout performed Patient Re-evaluated:Patient Re-evaluated prior to induction Oxygen Delivery Method: Circle system utilized Preoxygenation: Pre-oxygenation with 100% oxygen Induction Type: IV induction Ventilation: Mask ventilation without difficulty Laryngoscope Size: Glidescope and 4 Grade View: Grade I Tube type: Oral Tube size: 7.5 mm Number of attempts: 1 Airway Equipment and Method: Video-laryngoscopy and Rigid stylet Placement Confirmation: ETT inserted through vocal cords under direct vision,  positive ETCO2 and breath sounds checked- equal and bilateral Secured at: 23 cm Tube secured with: Tape Dental Injury: Teeth and Oropharynx as per pre-operative assessment

## 2019-12-22 NOTE — Progress Notes (Signed)
[facility]  Date of Admission:  12/19/2019      Total days of antibiotics 4  Day 4 vancomycin + Ceftriaxone           ASSESSMENT: Michael Lester is a 33 y.o. male with subacute osteomyelitis involving C6-7 developing over the last 3 weeks complicated by compressive epidural abscess at C7 nerve root. Surgery planned tonight @ 5:30.  Will continue Vancomycin + Ceftriaxone for now and follow micro data from surgery.   Please send cultures to lab in sterile cup to facilitate in ID of pathogen causing infection.   Substance Use Disorder with Injection - (+) Hep C Ab test. NR HIV. Hep B sAg negative and surface Ab c/w immunity. Following for Hep C RNA but would benefit from Hep A vaccine.     PLAN: 1. Continue vancomycin and ceftriaxone 2. Follow micro from surgery this evening.  3. Follow Hep C Ab (+) with RNA to determine chronic infection for outpatient management.  4. Hold on PICC line for now    Principal Problem:   Osteomyelitis of C6-7 Active Problems:   Abscess in epidural space of cervical spine   Injection of illicit drug within last 12 months   Syncope   Opioid dependence, daily use (HCC)   Positive hepatitis C antibody test   . enoxaparin (LOVENOX) injection  40 mg Subcutaneous Q24H  . gabapentin  100 mg Oral TID  . nicotine  14 mg Transdermal Daily  . sodium chloride flush  3 mL Intravenous Q12H    SUBJECTIVE: Severe pain unchanged. At least stable tingling/weakness compared to yesterday.  Tolerating IV abx without concern.    Review of Systems: Review of Systems  Constitutional: Negative for chills, fever, malaise/fatigue and weight loss.  HENT: Negative for sore throat.        Multiple dental carries  Respiratory: Negative for cough and sputum production.   Cardiovascular: Negative for chest pain and leg swelling.  Gastrointestinal: Negative for abdominal pain, diarrhea and vomiting.  Genitourinary: Negative for  dysuria and flank pain.  Musculoskeletal: Positive for neck pain. Negative for joint pain and myalgias.  Skin: Negative for rash.  Neurological: Positive for tingling and weakness. Negative for dizziness and headaches.  Psychiatric/Behavioral: Negative for depression and substance abuse. The patient is not nervous/anxious and does not have insomnia.     Allergies  Allergen Reactions  . Shellfish Allergy Anaphylaxis    OBJECTIVE: Vitals:   12/21/19 2354 12/22/19 0428 12/22/19 0741 12/22/19 1226  BP: 117/79 131/80 103/63 120/78  Pulse: 64 60 (!) 57 60  Resp: 16 16 20 17   Temp: 98.3 F (36.8 C) 98.6 F (37 C) 98 F (36.7 C) 97.6 F (36.4 C)  TempSrc: Oral Oral  Oral  SpO2: 97% 98% 98% 99%  Weight:      Height:       Body mass index is 20.8 kg/m.  Physical Exam Constitutional:      Appearance: He is well-developed.     Comments: Appears to be in significant pain trying to rest in bed.   HENT:     Mouth/Throat:     Mouth: Mucous membranes are moist.     Dentition: Normal dentition. No dental abscesses.     Pharynx: Oropharynx is clear.     Comments: Poor dentition with multiple carries noted.  Cardiovascular:     Rate and Rhythm: Normal rate and regular rhythm.  Heart sounds: Normal heart sounds.  Pulmonary:     Effort: Pulmonary effort is normal.     Breath sounds: Normal breath sounds.  Abdominal:     General: There is no distension.     Palpations: Abdomen is soft.     Tenderness: There is no abdominal tenderness.  Lymphadenopathy:     Cervical: No cervical adenopathy.  Skin:    General: Skin is warm and dry.     Findings: No rash.  Neurological:     Mental Status: He is alert and oriented to person, place, and time.  Psychiatric:        Judgment: Judgment normal.     Lab Results Lab Results  Component Value Date   WBC 5.6 12/22/2019   HGB 11.1 (L) 12/22/2019   HCT 34.0 (L) 12/22/2019   MCV 80.4 12/22/2019   PLT 251 12/22/2019    Lab Results    Component Value Date   CREATININE 1.03 12/22/2019   BUN 13 12/22/2019   NA 138 12/22/2019   K 3.8 12/22/2019   CL 106 12/22/2019   CO2 22 12/22/2019    Lab Results  Component Value Date   ALT 13 12/20/2019   AST 10 (L) 12/20/2019   ALKPHOS 89 12/20/2019   BILITOT 0.6 12/20/2019     Microbiology: Recent Results (from the past 240 hour(s))  Culture, blood (routine x 2)     Status: None (Preliminary result)   Collection Time: 12/19/19  3:41 PM   Specimen: BLOOD  Result Value Ref Range Status   Specimen Description BLOOD  R FOREARM  Final   Special Requests   Final    BOTTLES DRAWN AEROBIC AND ANAEROBIC Blood Culture adequate volume   Culture   Final    NO GROWTH 3 DAYS Performed at [facility], [address] [place], [place] [address]    Report Status PENDING  Incomplete  Culture, blood (routine x 2)     Status: Abnormal   Collection Time: 12/19/19  3:41 PM   Specimen: BLOOD  Result Value Ref Range Status   Specimen Description BLOOD  L FOREARM  Final   Special Requests   Final    BOTTLES DRAWN AEROBIC AND ANAEROBIC Blood Culture adequate volume   Culture  Setup Time   Final    GRAM POSITIVE COCCI IN BOTH AEROBIC AND ANAEROBIC BOTTLES CRITICAL RESULT CALLED TO, READ BACK BY AND VERIFIED WITH: JEENA PIGG AND V BRYAK AT 0719 ON 12/20/19 SNG    Culture (A)  Final    STAPHYLOCOCCUS SPECIES (COAGULASE NEGATIVE) THE SIGNIFICANCE OF ISOLATING THIS ORGANISM FROM A SINGLE SET OF BLOOD CULTURES WHEN MULTIPLE SETS ARE DRAWN IS UNCERTAIN. PLEASE NOTIFY THE MICROBIOLOGY DEPARTMENT WITHIN ONE WEEK IF SPECIATION AND SENSITIVITIES ARE REQUIRED.    Report Status 12/21/2019 FINAL  Final  Blood Culture ID Panel (Reflexed)     Status: Abnormal   Collection Time: 12/19/19  3:41 PM  Result Value Ref Range Status   Enterococcus species NOT DETECTED NOT DETECTED Final   Listeria monocytogenes NOT DETECTED NOT DETECTED Final   Staphylococcus species DETECTED (A) NOT DETECTED  Final    Comment: Methicillin (oxacillin) resistant coagulase negative staphylococcus. Possible blood culture contaminant (unless isolated from more than one blood culture draw or clinical case suggests pathogenicity). No antibiotic treatment is indicated for blood  culture contaminants. CRITICAL RESULT CALLED TO, READ BACK BY AND VERIFIED WITH: KAREN PATTON AT 0944 ON 12/20/19 SNG    Staphylococcus aureus (BCID)  NOT DETECTED NOT DETECTED Final   Methicillin resistance DETECTED (A) NOT DETECTED Final    Comment: CRITICAL RESULT CALLED TO, READ BACK BY AND VERIFIED WITH: KAREN PATTON AT 0944 ON 12/20/19 SNG    Streptococcus species NOT DETECTED NOT DETECTED Final   Streptococcus agalactiae NOT DETECTED NOT DETECTED Final   Streptococcus pneumoniae NOT DETECTED NOT DETECTED Final   Streptococcus pyogenes NOT DETECTED NOT DETECTED Final   Acinetobacter baumannii NOT DETECTED NOT DETECTED Final   Enterobacteriaceae species NOT DETECTED NOT DETECTED Final   Enterobacter cloacae complex NOT DETECTED NOT DETECTED Final   Escherichia coli NOT DETECTED NOT DETECTED Final   Klebsiella oxytoca NOT DETECTED NOT DETECTED Final   Klebsiella pneumoniae NOT DETECTED NOT DETECTED Final   Proteus species NOT DETECTED NOT DETECTED Final   Serratia marcescens NOT DETECTED NOT DETECTED Final   Haemophilus influenzae NOT DETECTED NOT DETECTED Final   Neisseria meningitidis NOT DETECTED NOT DETECTED Final   Pseudomonas aeruginosa NOT DETECTED NOT DETECTED Final   Candida albicans NOT DETECTED NOT DETECTED Final   Candida glabrata NOT DETECTED NOT DETECTED Final   Candida krusei NOT DETECTED NOT DETECTED Final   Candida parapsilosis NOT DETECTED NOT DETECTED Final   Candida tropicalis NOT DETECTED NOT DETECTED Final    Comment: Performed at [facility], [address] [place], [place] [address]  Respiratory Panel by RT PCR (Flu A&B, Covid) - Nasopharyngeal Swab     Status: None   Collection Time:  12/19/19  5:43 PM   Specimen: Nasopharyngeal Swab  Result Value Ref Range Status   SARS Coronavirus 2 by RT PCR NEGATIVE NEGATIVE Final    Comment: (NOTE) SARS-CoV-2 target nucleic acids are NOT DETECTED. The SARS-CoV-2 RNA is generally detectable in upper respiratoy specimens during the acute phase of infection. The lowest concentration of SARS-CoV-2 viral copies this assay can detect is 131 copies/mL. A negative result does not preclude SARS-Cov-2 infection and should not be used as the sole basis for treatment or other patient management decisions. A negative result may occur with  improper specimen collection/handling, submission of specimen other than nasopharyngeal swab, presence of viral mutation(s) within the areas targeted by this assay, and inadequate number of viral copies (<131 copies/mL). A negative result must be combined with clinical observations, patient history, and epidemiological information. The expected result is Negative. Fact Sheet for Patients:  PinkCheek.be Fact Sheet for Healthcare Providers:  GravelBags.it This test is not yet ap proved or cleared by the [place] FDA and  has been authorized for detection and/or diagnosis of SARS-CoV-2 by FDA under an Emergency Use Authorization (EUA). This EUA will remain  in effect (meaning this test can be used) for the duration of the COVID-19 declaration under Section 564(b)(1) of the Act, 21 U.S.C. section 360bbb-3(b)(1), unless the authorization is terminated or revoked sooner.    Influenza A by PCR NEGATIVE NEGATIVE Final   Influenza B by PCR NEGATIVE NEGATIVE Final    Comment: (NOTE) The Xpert Xpress SARS-CoV-2/FLU/RSV assay is intended as an aid in  the diagnosis of influenza from Nasopharyngeal swab specimens and  should not be used as a sole basis for treatment. Nasal washings and  aspirates are unacceptable for Xpert Xpress SARS-CoV-2/FLU/RSV    testing. Fact Sheet for Patients: PinkCheek.be Fact Sheet for Healthcare Providers: GravelBags.it This test is not yet approved or cleared by the [place] FDA and  has been authorized for detection and/or diagnosis of SARS-CoV-2 by  FDA under an Emergency Use  Authorization (EUA). This EUA will remain  in effect (meaning this test can be used) for the duration of the  Covid-19 declaration under Section 564(b)(1) of the Act, 21  U.S.C. section 360bbb-3(b)(1), unless the authorization is  terminated or revoked. Performed at [facility], [address] [place], [place] [address]   MRSA PCR Screening     Status: None   Collection Time: 12/20/19 12:54 AM   Specimen: Nasal Mucosa; Nasopharyngeal  Result Value Ref Range Status   MRSA by PCR NEGATIVE NEGATIVE Final    Comment:        The GeneXpert MRSA Assay (FDA approved for NASAL specimens only), is one component of a comprehensive MRSA colonization surveillance program. It is not intended to diagnose MRSA infection nor to guide or monitor treatment for MRSA infections. Performed at [facility], [address] [place], [place] [address]   Culture, blood (Routine X 2) w Reflex to ID Panel     Status: None (Preliminary result)   Collection Time: 12/20/19  1:07 PM   Specimen: BLOOD LEFT FOREARM  Result Value Ref Range Status   Specimen Description BLOOD LEFT FOREARM  Final   Special Requests   Final    BOTTLES DRAWN AEROBIC AND ANAEROBIC Blood Culture adequate volume   Culture   Final    NO GROWTH 2 DAYS Performed at [facility], [address], [place], [place] [address]    Report Status PENDING  Incomplete  Culture, blood (Routine X 2) w Reflex to ID Panel     Status: None (Preliminary result)   Collection Time: 12/20/19  1:16 PM   Specimen: BLOOD LEFT FOREARM  Result Value Ref Range Status   Specimen Description BLOOD LEFT FOREARM   Final   Special Requests   Final    BOTTLES DRAWN AEROBIC AND ANAEROBIC Blood Culture adequate volume   Culture   Final    NO GROWTH 2 DAYS Performed at [facility], [address] [place], [place] [address]    Report Status PENDING  Incomplete     Rexene Alberts, MSN, NP-C [facility]  [email or web address] Pager: [phone] Office: [phone] [facility] Main Line: [phone]

## 2019-12-22 NOTE — Transfer of Care (Signed)
Immediate Anesthesia Transfer of Care Note  Patient: Michael Lester  Procedure(s) Performed: cervical five-six, cervical six-seven anterior cervical discectomy with interbody fusion (N/A Neck)  Patient Location: PACU  Anesthesia Type:General  Level of Consciousness: drowsy  Airway & Oxygen Therapy: Patient Spontanous Breathing and Patient connected to face mask oxygen  Post-op Assessment: Report given to RN and Post -op Vital signs reviewed and stable  Post vital signs: Reviewed and stable  Last Vitals:  Vitals Value Taken Time  BP 128/84 12/22/19 2052  Temp    Pulse 92 12/22/19 2054  Resp 16 12/22/19 2054  SpO2 100 % 12/22/19 2054  Vitals shown include unvalidated device data.  Last Pain:  Vitals:   12/22/19 1549  TempSrc:   PainSc: Asleep      Patients Stated Pain Goal: 2 (12/22/19 0503)  Complications: No apparent anesthesia complications

## 2019-12-23 DIAGNOSIS — M79609 Pain in unspecified limb: Secondary | ICD-10-CM

## 2019-12-23 DIAGNOSIS — R202 Paresthesia of skin: Secondary | ICD-10-CM

## 2019-12-23 DIAGNOSIS — A419 Sepsis, unspecified organism: Secondary | ICD-10-CM

## 2019-12-23 LAB — BASIC METABOLIC PANEL
Anion gap: 10 (ref 5–15)
BUN: 18 mg/dL (ref 6–20)
CO2: 21 mmol/L — ABNORMAL LOW (ref 22–32)
Calcium: 9.1 mg/dL (ref 8.9–10.3)
Chloride: 106 mmol/L (ref 98–111)
Creatinine, Ser: 0.98 mg/dL (ref 0.61–1.24)
GFR calc Af Amer: >60 mL/min (ref >60)
GFR calc non Af Amer: >60 mL/min (ref >60)
Glucose, Bld: 149 mg/dL — ABNORMAL HIGH (ref 70–99)
Potassium: 4.5 mmol/L (ref 3.5–5.1)
Sodium: 137 mmol/L (ref 135–145)

## 2019-12-23 LAB — CBC
HCT: 35.5 % — ABNORMAL LOW (ref 39.0–52.0)
Hemoglobin: 11.8 g/dL — ABNORMAL LOW (ref 13.0–17.0)
MCH: 26.3 pg (ref 26.0–34.0)
MCHC: 33.2 g/dL (ref 30.0–36.0)
MCV: 79.1 fL — ABNORMAL LOW (ref 80.0–100.0)
Platelets: 292 10*3/uL (ref 150–400)
RBC: 4.49 MIL/uL (ref 4.22–5.81)
RDW: 13.4 % (ref 11.5–15.5)
WBC: 4.9 10*3/uL (ref 4.0–10.5)
nRBC: 0 % (ref 0.0–0.2)

## 2019-12-23 LAB — HCV RNA QUANT
HCV Quantitative Log: 1.954 log10 IU/mL (ref >1.70)
HCV Quantitative: 90 IU/mL (ref >50)

## 2019-12-23 MED ORDER — SENNOSIDES-DOCUSATE SODIUM 8.6-50 MG PO TABS
1.0000 | ORAL_TABLET | Freq: Two times a day (BID) | ORAL | Status: DC | PRN
  Administered 2019-12-30: 1 via ORAL
  Filled 2019-12-23: qty 1

## 2019-12-23 MED ORDER — POLYETHYLENE GLYCOL 3350 17 G PO PACK
17.0000 g | PACK | Freq: Two times a day (BID) | ORAL | Status: DC | PRN
  Administered 2019-12-24: 09:00:00 17 g via ORAL
  Filled 2019-12-23: qty 1

## 2019-12-23 NOTE — Progress Notes (Signed)
[facility]  Date of Admission:  12/19/2019      Total days of antibiotics 5  Day 5 vancomycin + Ceftriaxone           ASSESSMENT: Michael Lester is a 34 y.o. male with subacute osteomyelitis involving C6-7 complicated by compressive epidural abscess at C7 nerve root. POD1 anterior decompression with fusion. Phlegmon described but bone did not appear to be badly damaged. Gram stain is without organisms and no growth thusfar < 24h.   Will continue vancomycin + ceftriaxone IV.   Substance Use Disorder with Injection - (+) Hep C Ab test. NR HIV. Hep B sAg negative and surface Ab c/w immunity. Following for Hep C RNA but would benefit from Hep A vaccine.     PLAN: 1. Continue vancomycin and ceftriaxone 2. Follow micro from surgery   3. Follow Hep C Ab (+) with RNA to determine chronic infection for outpatient management.  4. Hold on PICC line for now    Principal Problem:   Osteomyelitis of C6-7 Active Problems:   Abscess in epidural space of cervical spine   Injection of illicit drug within last 12 months   Syncope   Opioid dependence, daily use (HCC)   Positive hepatitis C antibody test   . enoxaparin (LOVENOX) injection  40 mg Subcutaneous Q24H  . gabapentin  100 mg Oral TID  . nicotine  14 mg Transdermal Daily  . sodium chloride flush  3 mL Intravenous Q12H  . sodium chloride flush  3 mL Intravenous Q12H    SUBJECTIVE: Severe pain unchanged. At least stable tingling/weakness compared to yesterday.  Tolerating IV abx without concern.    Review of Systems: Review of Systems  Constitutional: Negative for chills and fever.  Musculoskeletal: Positive for neck pain.    Allergies  Allergen Reactions  . Shellfish Allergy Anaphylaxis    OBJECTIVE: Vitals:   12/23/19 0000 12/23/19 0400 12/23/19 0741 12/23/19 1139  BP: 114/72 110/75 125/74 123/73  Pulse: 60 71 72 88  Resp: 14 13 14 17   Temp:  (!) 97.4 F (36.3 C) 97.8 F  (36.6 C) 98.2 F (36.8 C)  TempSrc:  Oral Oral Oral  SpO2: 96% 100% 99% 98%  Weight:      Height:       Body mass index is 20.8 kg/m.  Physical Exam Constitutional:      Comments: Trying to sleep upright in bed.   Cardiovascular:     Rate and Rhythm: Normal rate and regular rhythm.  Pulmonary:     Effort: Pulmonary effort is normal.  Musculoskeletal:     Comments: Attempting to sleep and not move due to pain presently. Noted improvement outlined in Dr. note with regards to neuro exam.   Skin:    General: Skin is warm and dry.  Neurological:     Mental Status: He is oriented to person, place, and time.     Lab Results Lab Results  Component Value Date   WBC 4.9 12/23/2019   HGB 11.8 (L) 12/23/2019   HCT 35.5 (L) 12/23/2019   MCV 79.1 (L) 12/23/2019   PLT 292 12/23/2019    Lab Results  Component Value Date   CREATININE 0.98 12/23/2019   BUN 18 12/23/2019   NA 137 12/23/2019   K 4.5 12/23/2019   CL 106 12/23/2019   CO2 21 (L) 12/23/2019    Lab Results  Component Value Date   ALT  13 12/20/2019   AST 10 (L) 12/20/2019   ALKPHOS 89 12/20/2019   BILITOT 0.6 12/20/2019     Microbiology: Recent Results (from the past 240 hour(s))  Culture, blood (routine x 2)     Status: None (Preliminary result)   Collection Time: 12/19/19  3:41 PM   Specimen: BLOOD  Result Value Ref Range Status   Specimen Description BLOOD  R FOREARM  Final   Special Requests   Final    BOTTLES DRAWN AEROBIC AND ANAEROBIC Blood Culture adequate volume   Culture   Final    NO GROWTH 4 DAYS Performed at [facility], [address] [place], [place] [address]    Report Status PENDING  Incomplete  Culture, blood (routine x 2)     Status: Abnormal   Collection Time: 12/19/19  3:41 PM   Specimen: BLOOD  Result Value Ref Range Status   Specimen Description BLOOD  L FOREARM  Final   Special Requests   Final    BOTTLES DRAWN AEROBIC AND ANAEROBIC Blood Culture adequate  volume   Culture  Setup Time   Final    GRAM POSITIVE COCCI IN BOTH AEROBIC AND ANAEROBIC BOTTLES CRITICAL RESULT CALLED TO, READ BACK BY AND VERIFIED WITH: JEENA PIGG AND V BRYAK AT 0719 ON 12/20/19 SNG    Culture (A)  Final    STAPHYLOCOCCUS SPECIES (COAGULASE NEGATIVE) THE SIGNIFICANCE OF ISOLATING THIS ORGANISM FROM A SINGLE SET OF BLOOD CULTURES WHEN MULTIPLE SETS ARE DRAWN IS UNCERTAIN. PLEASE NOTIFY THE MICROBIOLOGY DEPARTMENT WITHIN ONE WEEK IF SPECIATION AND SENSITIVITIES ARE REQUIRED.    Report Status 12/21/2019 FINAL  Final  Blood Culture ID Panel (Reflexed)     Status: Abnormal   Collection Time: 12/19/19  3:41 PM  Result Value Ref Range Status   Enterococcus species NOT DETECTED NOT DETECTED Final   Listeria monocytogenes NOT DETECTED NOT DETECTED Final   Staphylococcus species DETECTED (A) NOT DETECTED Final    Comment: Methicillin (oxacillin) resistant coagulase negative staphylococcus. Possible blood culture contaminant (unless isolated from more than one blood culture draw or clinical case suggests pathogenicity). No antibiotic treatment is indicated for blood  culture contaminants. CRITICAL RESULT CALLED TO, READ BACK BY AND VERIFIED WITH: KAREN PATTON AT 0944 ON 12/20/19 SNG    Staphylococcus aureus (BCID) NOT DETECTED NOT DETECTED Final   Methicillin resistance DETECTED (A) NOT DETECTED Final    Comment: CRITICAL RESULT CALLED TO, READ BACK BY AND VERIFIED WITH: KAREN PATTON AT 0944 ON 12/20/19 SNG    Streptococcus species NOT DETECTED NOT DETECTED Final   Streptococcus agalactiae NOT DETECTED NOT DETECTED Final   Streptococcus pneumoniae NOT DETECTED NOT DETECTED Final   Streptococcus pyogenes NOT DETECTED NOT DETECTED Final   Acinetobacter baumannii NOT DETECTED NOT DETECTED Final   Enterobacteriaceae species NOT DETECTED NOT DETECTED Final   Enterobacter cloacae complex NOT DETECTED NOT DETECTED Final   Escherichia coli NOT DETECTED NOT DETECTED Final    Klebsiella oxytoca NOT DETECTED NOT DETECTED Final   Klebsiella pneumoniae NOT DETECTED NOT DETECTED Final   Proteus species NOT DETECTED NOT DETECTED Final   Serratia marcescens NOT DETECTED NOT DETECTED Final   Haemophilus influenzae NOT DETECTED NOT DETECTED Final   Neisseria meningitidis NOT DETECTED NOT DETECTED Final   Pseudomonas aeruginosa NOT DETECTED NOT DETECTED Final   Candida albicans NOT DETECTED NOT DETECTED Final   Candida glabrata NOT DETECTED NOT DETECTED Final   Candida krusei NOT DETECTED NOT DETECTED Final   Candida parapsilosis  NOT DETECTED NOT DETECTED Final   Candida tropicalis NOT DETECTED NOT DETECTED Final    Comment: Performed at [facility], [address] [place], [place] [address]  Respiratory Panel by RT PCR (Flu A&B, Covid) - Nasopharyngeal Swab     Status: None   Collection Time: 12/19/19  5:43 PM   Specimen: Nasopharyngeal Swab  Result Value Ref Range Status   SARS Coronavirus 2 by RT PCR NEGATIVE NEGATIVE Final    Comment: (NOTE) SARS-CoV-2 target nucleic acids are NOT DETECTED. The SARS-CoV-2 RNA is generally detectable in upper respiratoy specimens during the acute phase of infection. The lowest concentration of SARS-CoV-2 viral copies this assay can detect is 131 copies/mL. A negative result does not preclude SARS-Cov-2 infection and should not be used as the sole basis for treatment or other patient management decisions. A negative result may occur with  improper specimen collection/handling, submission of specimen other than nasopharyngeal swab, presence of viral mutation(s) within the areas targeted by this assay, and inadequate number of viral copies (<131 copies/mL). A negative result must be combined with clinical observations, patient history, and epidemiological information. The expected result is Negative. Fact Sheet for Patients:  https://www.moore.com/ Fact Sheet for Healthcare Providers:    https://www.young.biz/ This test is not yet ap proved or cleared by the [place] FDA and  has been authorized for detection and/or diagnosis of SARS-CoV-2 by FDA under an Emergency Use Authorization (EUA). This EUA will remain  in effect (meaning this test can be used) for the duration of the COVID-19 declaration under Section 564(b)(1) of the Act, 21 U.S.C. section 360bbb-3(b)(1), unless the authorization is terminated or revoked sooner.    Influenza A by PCR NEGATIVE NEGATIVE Final   Influenza B by PCR NEGATIVE NEGATIVE Final    Comment: (NOTE) The Xpert Xpress SARS-CoV-2/FLU/RSV assay is intended as an aid in  the diagnosis of influenza from Nasopharyngeal swab specimens and  should not be used as a sole basis for treatment. Nasal washings and  aspirates are unacceptable for Xpert Xpress SARS-CoV-2/FLU/RSV  testing. Fact Sheet for Patients: https://www.moore.com/ Fact Sheet for Healthcare Providers: https://www.young.biz/ This test is not yet approved or cleared by the [place] FDA and  has been authorized for detection and/or diagnosis of SARS-CoV-2 by  FDA under an Emergency Use Authorization (EUA). This EUA will remain  in effect (meaning this test can be used) for the duration of the  Covid-19 declaration under Section 564(b)(1) of the Act, 21  U.S.C. section 360bbb-3(b)(1), unless the authorization is  terminated or revoked. Performed at [facility], [address] [place], [place] [address]   MRSA PCR Screening     Status: None   Collection Time: 12/20/19 12:54 AM   Specimen: Nasal Mucosa; Nasopharyngeal  Result Value Ref Range Status   MRSA by PCR NEGATIVE NEGATIVE Final    Comment:        The GeneXpert MRSA Assay (FDA approved for NASAL specimens only), is one component of a comprehensive MRSA colonization surveillance program. It is not intended to diagnose MRSA infection nor to  guide or monitor treatment for MRSA infections. Performed at [facility], [address], [place], [place] [address]   Culture, blood (Routine X 2) w Reflex to ID Panel     Status: None (Preliminary result)   Collection Time: 12/20/19  1:07 PM   Specimen: BLOOD LEFT FOREARM  Result Value Ref Range Status   Specimen Description BLOOD LEFT FOREARM  Final  Special Requests   Final    BOTTLES DRAWN AEROBIC AND ANAEROBIC Blood Culture adequate volume   Culture   Final    NO GROWTH 2 DAYS Performed at [facility], [place] [address] [place], [place] [address]    Report Status PENDING  Incomplete  Culture, blood (Routine X 2) w Reflex to ID Panel     Status: None (Preliminary result)   Collection Time: 12/20/19  1:16 PM   Specimen: BLOOD LEFT FOREARM  Result Value Ref Range Status   Specimen Description BLOOD LEFT FOREARM  Final   Special Requests   Final    BOTTLES DRAWN AEROBIC AND ANAEROBIC Blood Culture adequate volume   Culture   Final    NO GROWTH 2 DAYS Performed at [facility], [place] [address], [place], [place] [address]    Report Status PENDING  Incomplete  Aerobic Culture (superficial specimen)     Status: None (Preliminary result)   Collection Time: 12/22/19  7:20 PM   Specimen: Wound; Abscess  Result Value Ref Range Status   Specimen Description WOUND CERVICAL DISC SPACE  Final   Special Requests PATIENT ON FOLLOWING ROCEPHIN AND VANC  Final   Gram Stain   Final    RARE WBC PRESENT, PREDOMINANTLY PMN NO ORGANISMS SEEN    Culture   Final    NO GROWTH < 24 HOURS Performed at [facility], [place] [address], [place],  [address]    Report Status PENDING  Incomplete     Janene Madeira, MSN, NP-C [facility] [email or web address] Pager: [phone] Office: [phone] [place]: [phone]

## 2019-12-23 NOTE — Progress Notes (Signed)
PROGRESS NOTE  DEAUNDRE ALLSTON BTD:[id number] DOB: Oct 20, 1986   PCP: Patient, [name]  Patient is from: home  DOA: 12/19/2019 LOS: 4  Brief Narrative / Interim history: 34 year old male with history of IVDU presented to [facility] after found unresponsive and complaining of headache, neck pain, right shoulder pain and paresthesia in his right arm fingers.  He was septic with tachycardia and leukocytosis.  CT head and cervical spine without acute finding but abnormal lucency at the adjacent endplates of C6-7.  MRI cervical spine showed discitis and osteomyelitis at C6-7 with prevertebral edema and phlegmon resulting in spinal stenosis and cord mass-effect without definite cord signal abnormality and drainable fluid collection.  Resuscitated with IV fluids and started on cefepime, vancomycin and Flagyl.  NS at [facility] consulted by EDP I recommended admission to hospitalist service [facility] hospital for further treatment.  Blood culture at [facility] coag negative methicillin-resistant staph in 1 out of 2 bottles.  ID consulted and felt this to be contaminant.  Repeat blood culture here NGTD.  Patient underwent ACDF by neurosurgery on 12/22/2019.  Remains on vancomycin and ceftriaxone per ID pending tissue cultures.  Subjective: No major events overnight or this morning.  Reports pain in his neck and right shoulder. Still with paresthesia in his right forearm and weakness with grip strengths.  He rates his neck pain 6/10.  Denies headache, vision change, chest pain, dyspnea, GI or UTI symptoms.  He says he has not had a bowel movement in 4 days.  Objective: Vitals:   12/23/19 0000 12/23/19 0400 12/23/19 0741 12/23/19 1139  BP: 114/72 110/75 125/74 123/73  Pulse: 60 71 72 88  Resp: 14 13 14 17   Temp:  (!) 97.4 F (36.3 C) 97.8 F (36.6 C) 98.2 F (36.8 C)  TempSrc:  Oral Oral Oral  SpO2: 96% 100% 99% 98%  Weight:      Height:        Intake/Output Summary (Last 24 hours) at 12/23/2019 1229 Last data filed at  12/23/2019 1032 Gross per 24 hour  Intake 2301 ml  Output 1600 ml  Net 701 ml   Filed Weights   12/20/19 0135  Weight: 71.5 kg    Examination:  GENERAL: No acute distress.  Appears well.  HEENT: MMM.  Vision and hearing grossly intact.  NECK: In a soft collar. RESP:  No IWOB.  Fair aeration bilaterally. CVS:  RRR. Heart sounds normal.  ABD/GI/GU: Bowel sounds present. Soft. Non tender.  MSK/EXT:  Moves extremities. No apparent deformity. No edema.  SKIN: no apparent skin lesion or wound NEURO: Awake, alert and oriented appropriately.  Diminished light sensation in right forearm.  Grip 3+/5 in right arm.  4/5 with elbow flexion in the right.  Neuro exam was normal elsewhere. PSYCH: Calm. Normal affect.  Procedures:  2/23-ACDF by Dr. 12/22/19  Assessment & Plan: Sepsis due to discitis/osteomyelitis in patient with history of IVDU C6-7 discitis/osteomyelitis with RUE paresthesia and weakness -Initial blood culture with coag negative methicillin-resistant staph in 1 out of 2 bottles-felt to be contaminant -Repeat blood culture NGTD -Neurosurgery and ID following-appreciate care and guidance -ACDF by Dr. Jordan Likes on 2/23.  Tissue cultures NGTD so far. -On vancomycin and ceftriaxone-plan for 6 to 8 weeks of IV antibiotics -Follow tissue cultures  Abnormal blood culture: Initial blood culture at [facility] with coag negative methicillin-resistant staph in 1 out of 2 bottles.  Felt to be contaminant.  Repeat blood cultures NGTD.  TTE without significant finding.  Recurrent syncope:  Due to drug use?  EKG with ST but no other significant finding.  Echo unremarkable. -PT/OT  IVDU-Daily heroin use -Encourage cessation -May consider Suboxone if withdrawal symptoms -CSW consulted for resources and counseling  Normocytic anemia: Likely ACD.  H&H stable -Continue monitoring  Constipation -Bowel regimen                DVT prophylaxis: Subcu Lovenox Code Status: Full code Family  Communication: Patient and/or RN. Available if any question.  Discharge barrier: IV antibiotics for osteomyelitis/discitis Patient is from: Home Final disposition: To be determined.  Patient on IV antibiotics.  History of IVDU.  Difficult disposition  Consultants: Neurosurgery, ID   Microbiology summarized: 2/20-blood culture with coag negative methicillin-resistant staph in 1 out of 2 bottles 2/21- Blood cultures NGTD 2/21-MRSA PCR negative 2/23-tissue cultures NGTD  Sch Meds:  Scheduled Meds: . enoxaparin (LOVENOX) injection  40 mg Subcutaneous Q24H  . gabapentin  100 mg Oral TID  . nicotine  14 mg Transdermal Daily  . sodium chloride flush  3 mL Intravenous Q12H  . sodium chloride flush  3 mL Intravenous Q12H   Continuous Infusions: . sodium chloride 250 mL (12/22/19 2244)  . cefTRIAXone (ROCEPHIN)  IV 2 g (12/23/19 0030)  . lactated ringers 200 mL/hr at 12/22/19 2004  . vancomycin 1,250 mg (12/23/19 0938)   PRN Meds:.acetaminophen **OR** acetaminophen, cyclobenzaprine, HYDROcodone-acetaminophen, HYDROcodone-acetaminophen, HYDROmorphone (DILAUDID) injection, menthol-cetylpyridinium **OR** phenol, morphine injection, ondansetron **OR** ondansetron (ZOFRAN) IV, polyethylene glycol, sodium chloride flush  Antimicrobials: Anti-infectives (From admission, onward)   Start     Dose/Rate Route Frequency Ordered Stop   12/22/19 1938  bacitracin 50,000 Units in sodium chloride 0.9 % 500 mL irrigation  Status:  Discontinued       As needed 12/22/19 1939 12/22/19 2048   12/22/19 1821  ceFAZolin (ANCEF) 2-4 GM/100ML-% IVPB    Note to Pharmacy: [place]   : cabinet override      12/22/19 1821 12/23/19 0629   12/22/19 1000  vancomycin (VANCOREADY) IVPB 1250 mg/250 mL     1,250 mg 166.7 mL/hr over 90 Minutes Intravenous Every 12 hours 12/21/19 2301     12/20/19 0600  vancomycin (VANCOREADY) IVPB 1750 mg/350 mL  Status:  Discontinued     1,750 mg 175 mL/hr over 120 Minutes  Intravenous Every 12 hours 12/20/19 0042 12/21/19 2301   12/20/19 0030  cefTRIAXone (ROCEPHIN) 2 g in sodium chloride 0.9 % 100 mL IVPB     2 g 200 mL/hr over 30 Minutes Intravenous Every 24 hours 12/20/19 0027         I have personally reviewed the following labs and images: CBC: Recent Labs  Lab 12/19/19 1515 12/19/19 1515 12/20/19 0439 12/20/19 1314 12/21/19 0452 12/22/19 0650 12/23/19 0629  WBC 14.4*   < > 6.1 5.7 5.7 5.6 4.9  NEUTROABS 12.6*  --  3.6  --   --   --   --   HGB 11.3*   < > 9.8* 11.1* 11.1* 11.1* 11.8*  HCT 35.1*   < > 29.9* 34.0* 33.3* 34.0* 35.5*  MCV 81.6   < > 81.3 80.6 79.3* 80.4 79.1*  PLT 325   < > 225 241 274 251 292   < > = values in this interval not displayed.   BMP &GFR Recent Labs  Lab 12/19/19 1515 12/19/19 1515 12/20/19 0439 12/20/19 1314 12/21/19 0452 12/22/19 0650 12/23/19 0629  NA 138  --  137  --  137 138 137  K  4.7  --  3.8  --  3.6 3.8 4.5  CL 105  --  103  --  102 106 106  CO2 24  --  26  --  24 22 21*  GLUCOSE 195*  --  88  --  125* 106* 149*  BUN 23*  --  16  --  12 13 18   CREATININE 1.19   < > 0.99 1.08 0.95 1.03 0.98  CALCIUM 9.0  --  8.7*  --  8.6* 8.9 9.1  MG  --   --  1.6*  --  2.0 1.8  --    < > = values in this interval not displayed.   Estimated Creatinine Clearance: 106.4 mL/min (by C-G formula based on SCr of 0.98 mg/dL). Liver & Pancreas: Recent Labs  Lab 12/19/19 1515 12/20/19 0439  AST 17 10*  ALT 15 13  ALKPHOS 112 89  BILITOT 0.2* 0.6  PROT 8.4* 6.2*  ALBUMIN 3.6 2.6*   No results for input(s): LIPASE, AMYLASE in the last 168 hours. No results for input(s): AMMONIA in the last 168 hours. Diabetic: No results for input(s): HGBA1C in the last 72 hours. No results for input(s): GLUCAP in the last 168 hours. Cardiac Enzymes: Recent Labs  Lab 12/19/19 1515  CKTOTAL 86   No results for input(s): PROBNP in the last 8760 hours. Coagulation Profile: Recent Labs  Lab 12/19/19 1743  INR 1.2    Thyroid Function Tests: No results for input(s): TSH, T4TOTAL, FREET4, T3FREE, THYROIDAB in the last 72 hours. Lipid Profile: No results for input(s): CHOL, HDL, LDLCALC, TRIG, CHOLHDL, LDLDIRECT in the last 72 hours. Anemia Panel: Recent Labs    12/21/19 0452  FERRITIN 72  TIBC 342  IRON 38*   Urine analysis: No results found for: COLORURINE, APPEARANCEUR, LABSPEC, PHURINE, GLUCOSEU, HGBUR, BILIRUBINUR, KETONESUR, PROTEINUR, UROBILINOGEN, NITRITE, LEUKOCYTESUR Sepsis Labs: Invalid input(s): PROCALCITONIN, LACTICIDVEN  Microbiology: Recent Results (from the past 240 hour(s))  Culture, blood (routine x 2)     Status: None (Preliminary result)   Collection Time: 12/19/19  3:41 PM   Specimen: BLOOD  Result Value Ref Range Status   Specimen Description BLOOD  R FOREARM  Final   Special Requests   Final    BOTTLES DRAWN AEROBIC AND ANAEROBIC Blood Culture adequate volume   Culture   Final    NO GROWTH 4 DAYS Performed at [facility], [address] [place], [place] [address]    Report Status PENDING  Incomplete  Culture, blood (routine x 2)     Status: Abnormal   Collection Time: 12/19/19  3:41 PM   Specimen: BLOOD  Result Value Ref Range Status   Specimen Description BLOOD  L FOREARM  Final   Special Requests   Final    BOTTLES DRAWN AEROBIC AND ANAEROBIC Blood Culture adequate volume   Culture  Setup Time   Final    GRAM POSITIVE COCCI IN BOTH AEROBIC AND ANAEROBIC BOTTLES CRITICAL RESULT CALLED TO, READ BACK BY AND VERIFIED WITH: JEENA PIGG AND V BRYAK AT 0719 ON 12/20/19 SNG    Culture (A)  Final    STAPHYLOCOCCUS SPECIES (COAGULASE NEGATIVE) THE SIGNIFICANCE OF ISOLATING THIS ORGANISM FROM A SINGLE SET OF BLOOD CULTURES WHEN MULTIPLE SETS ARE DRAWN IS UNCERTAIN. PLEASE NOTIFY THE MICROBIOLOGY DEPARTMENT WITHIN ONE WEEK IF SPECIATION AND SENSITIVITIES ARE REQUIRED.    Report Status 12/21/2019 FINAL  Final  Blood Culture ID Panel (Reflexed)     Status:  Abnormal  Collection Time: 12/19/19  3:41 PM  Result Value Ref Range Status   Enterococcus species NOT DETECTED NOT DETECTED Final   Listeria monocytogenes NOT DETECTED NOT DETECTED Final   Staphylococcus species DETECTED (A) NOT DETECTED Final    Comment: Methicillin (oxacillin) resistant coagulase negative staphylococcus. Possible blood culture contaminant (unless isolated from more than one blood culture draw or clinical case suggests pathogenicity). No antibiotic treatment is indicated for blood  culture contaminants. CRITICAL RESULT CALLED TO, READ BACK BY AND VERIFIED WITH: KAREN PATTON AT 0944 ON 12/20/19 SNG    Staphylococcus aureus (BCID) NOT DETECTED NOT DETECTED Final   Methicillin resistance DETECTED (A) NOT DETECTED Final    Comment: CRITICAL RESULT CALLED TO, READ BACK BY AND VERIFIED WITH: KAREN PATTON AT 0944 ON 12/20/19 SNG    Streptococcus species NOT DETECTED NOT DETECTED Final   Streptococcus agalactiae NOT DETECTED NOT DETECTED Final   Streptococcus pneumoniae NOT DETECTED NOT DETECTED Final   Streptococcus pyogenes NOT DETECTED NOT DETECTED Final   Acinetobacter baumannii NOT DETECTED NOT DETECTED Final   Enterobacteriaceae species NOT DETECTED NOT DETECTED Final   Enterobacter cloacae complex NOT DETECTED NOT DETECTED Final   Escherichia coli NOT DETECTED NOT DETECTED Final   Klebsiella oxytoca NOT DETECTED NOT DETECTED Final   Klebsiella pneumoniae NOT DETECTED NOT DETECTED Final   Proteus species NOT DETECTED NOT DETECTED Final   Serratia marcescens NOT DETECTED NOT DETECTED Final   Haemophilus influenzae NOT DETECTED NOT DETECTED Final   Neisseria meningitidis NOT DETECTED NOT DETECTED Final   Pseudomonas aeruginosa NOT DETECTED NOT DETECTED Final   Candida albicans NOT DETECTED NOT DETECTED Final   Candida glabrata NOT DETECTED NOT DETECTED Final   Candida krusei NOT DETECTED NOT DETECTED Final   Candida parapsilosis NOT DETECTED NOT DETECTED Final    Candida tropicalis NOT DETECTED NOT DETECTED Final    Comment: Performed at [facility], [address] [place], [place] [address]  Respiratory Panel by RT PCR (Flu A&B, Covid) - Nasopharyngeal Swab     Status: None   Collection Time: 12/19/19  5:43 PM   Specimen: Nasopharyngeal Swab  Result Value Ref Range Status   SARS Coronavirus 2 by RT PCR NEGATIVE NEGATIVE Final    Comment: (NOTE) SARS-CoV-2 target nucleic acids are NOT DETECTED. The SARS-CoV-2 RNA is generally detectable in upper respiratoy specimens during the acute phase of infection. The lowest concentration of SARS-CoV-2 viral copies this assay can detect is 131 copies/mL. A negative result does not preclude SARS-Cov-2 infection and should not be used as the sole basis for treatment or other patient management decisions. A negative result may occur with  improper specimen collection/handling, submission of specimen other than nasopharyngeal swab, presence of viral mutation(s) within the areas targeted by this assay, and inadequate number of viral copies (<131 copies/mL). A negative result must be combined with clinical observations, patient history, and epidemiological information. The expected result is Negative. Fact Sheet for Patients:  https://www.moore.com/ Fact Sheet for Healthcare Providers:  https://www.young.biz/ This test is not yet ap proved or cleared by the [place] FDA and  has been authorized for detection and/or diagnosis of SARS-CoV-2 by FDA under an Emergency Use Authorization (EUA). This EUA will remain  in effect (meaning this test can be used) for the duration of the COVID-19 declaration under Section 564(b)(1) of the Act, 21 U.S.C. section 360bbb-3(b)(1), unless the authorization is terminated or revoked sooner.    Influenza A by PCR NEGATIVE NEGATIVE Final   Influenza  B by PCR NEGATIVE NEGATIVE Final    Comment: (NOTE) The Xpert Xpress  SARS-CoV-2/FLU/RSV assay is intended as an aid in  the diagnosis of influenza from Nasopharyngeal swab specimens and  should not be used as a sole basis for treatment. Nasal washings and  aspirates are unacceptable for Xpert Xpress SARS-CoV-2/FLU/RSV  testing. Fact Sheet for Patients: https://www.moore.com/ Fact Sheet for Healthcare Providers: https://www.young.biz/ This test is not yet approved or cleared by the [place] FDA and  has been authorized for detection and/or diagnosis of SARS-CoV-2 by  FDA under an Emergency Use Authorization (EUA). This EUA will remain  in effect (meaning this test can be used) for the duration of the  Covid-19 declaration under Section 564(b)(1) of the Act, 21  U.S.C. section 360bbb-3(b)(1), unless the authorization is  terminated or revoked. Performed at [facility], [address] [place], [place] [address]   MRSA PCR Screening     Status: None   Collection Time: 12/20/19 12:54 AM   Specimen: Nasal Mucosa; Nasopharyngeal  Result Value Ref Range Status   MRSA by PCR NEGATIVE NEGATIVE Final    Comment:        The GeneXpert MRSA Assay (FDA approved for NASAL specimens only), is one component of a comprehensive MRSA colonization surveillance program. It is not intended to diagnose MRSA infection nor to guide or monitor treatment for MRSA infections. Performed at [facility], [address], [place], [place] [address]   Culture, blood (Routine X 2) w Reflex to ID Panel     Status: None (Preliminary result)   Collection Time: 12/20/19  1:07 PM   Specimen: BLOOD LEFT FOREARM  Result Value Ref Range Status   Specimen Description BLOOD LEFT FOREARM  Final   Special Requests   Final    BOTTLES DRAWN AEROBIC AND ANAEROBIC Blood Culture adequate volume   Culture   Final    NO GROWTH 2 DAYS Performed at [facility], [address], [place], [place] [address]    Report Status  PENDING  Incomplete  Culture, blood (Routine X 2) w Reflex to ID Panel     Status: None (Preliminary result)   Collection Time: 12/20/19  1:16 PM   Specimen: BLOOD LEFT FOREARM  Result Value Ref Range Status   Specimen Description BLOOD LEFT FOREARM  Final   Special Requests   Final    BOTTLES DRAWN AEROBIC AND ANAEROBIC Blood Culture adequate volume   Culture   Final    NO GROWTH 2 DAYS Performed at [facility], [address], [place], [place] [address]    Report Status PENDING  Incomplete  Aerobic Culture (superficial specimen)     Status: None (Preliminary result)   Collection Time: 12/22/19  7:20 PM   Specimen: Wound; Abscess  Result Value Ref Range Status   Specimen Description WOUND CERVICAL DISC SPACE  Final   Special Requests PATIENT ON FOLLOWING ROCEPHIN AND VANC  Final   Gram Stain   Final    RARE WBC PRESENT, PREDOMINANTLY PMN NO ORGANISMS SEEN    Culture   Final    NO GROWTH < 24 HOURS Performed at [facility], [address] [place], [place] [address]    Report Status PENDING  Incomplete    Radiology Studies: DG Cervical Spine 2-3 Views  Result Date: 12/22/2019 CLINICAL DATA:  C5-C7 ACDF, discitis/osteomyelitis at C6/C7 EXAM: CERVICAL SPINE - 2-3 VIEW; DG C-ARM 1-60 MIN COMPARISON:  12/19/2019 FINDINGS:  A single fluoroscopic images obtained during the performance of the procedure and is provided for interpretation only. Anterior cervical fusion hardware is identified in the lower cervical spine, reportedly from C5 through C7. Intervening discectomies. Prevertebral soft tissue edema. The patient is intubated. FLUOROSCOPY TIME:  9 seconds IMPRESSION: Anterior cervical fusion hardware in the lower cervical spine, reportedly from C5 through C7. Electronically Signed   By: Randa Ngo M.D.   On: 12/22/2019 20:56   DG C-Arm 1-60 Min  Result Date: 12/22/2019 CLINICAL DATA:  C5-C7 ACDF, discitis/osteomyelitis at C6/C7 EXAM: CERVICAL SPINE - 2-3 VIEW; DG  C-ARM 1-60 MIN COMPARISON:  12/19/2019 FINDINGS: A single fluoroscopic images obtained during the performance of the procedure and is provided for interpretation only. Anterior cervical fusion hardware is identified in the lower cervical spine, reportedly from C5 through C7. Intervening discectomies. Prevertebral soft tissue edema. The patient is intubated. FLUOROSCOPY TIME:  9 seconds IMPRESSION: Anterior cervical fusion hardware in the lower cervical spine, reportedly from C5 through C7. Electronically Signed   By: Randa Ngo M.D.   On: 12/22/2019 20:56     Icholas Irby T. Soper  If 7PM-7AM, please contact night-coverage [email or web address] Password [facility] An 12/23/2019, 12:29 PM

## 2019-12-23 NOTE — Progress Notes (Signed)
Postop day 1.  Overall stable overnight.  Pain seems better controlled.  Still complains of numbness in his right upper extremity which is expected.  Radicular pain appears improved.  Swallowing reasonably well.  Neck soft.  Awake and alert.  Oriented and reasonably appropriate.  Motor examination with some mild continued weakness of his right triceps muscle group and grip but improved from preop.  Status post two-level anterior cervical decompression and fusion.  Findings intraoperatively without of chronic appearing osteomyelitis discitis at C6-7.  No evidence of true abscess but there was a significant phlegmon in the anterior epidural space as well as paravertebrally along the longus coli muscles bilaterally.  Cultures were taken.  Continue antibiotics.  No new recommendations.  Patient may be mobilized ad lib.

## 2019-12-23 NOTE — Progress Notes (Signed)
Orthopedic Tech Progress Note Patient Details:  Michael Lester 07-10-1986 [id number]  Ortho Devices Type of Ortho Device: Soft collar Ortho Device/Splint Interventions: Ordered, Application, Adjustment   Post Interventions Patient Tolerated: Well Instructions Provided: Care of device, Adjustment of device   Trinna Post 12/23/2019, 7:11 AM

## 2019-12-23 NOTE — Evaluation (Addendum)
Occupational Therapy Evaluation Patient Details Name: Michael Lester MRN: [id number] DOB: [birth year] Today's Date: 12/23/2019    History of Present Illness Michael Lester is a 34 y.o. male with medical history significant for IV heroin abuse, now presenting to the emergency department after he was found unresponsive and complaining of headache and neck pain.  Patient reports 1 to 2 weeks of pain near the base of his neck that radiates to the right shoulder and has been associated with numbness involving the right second and third fingers. Pt now s/p C5-7 ACDF due to osteomyeltis discitis with epidural abscess.   Clinical Impression   PTA pt independent, working self employed in Architect. At time of eval, he presents with cervical pain with radiating nerve pain and weakness. Noted gross tricep and grip strength weakness. Appears to be median and radial nerve involvement. Middle finger is without sensation, and 1st finger with limited sensation- as well as dorsum of hand radiating up to forearm. Educated pt on safety with impaired sensation and issued Harvey ball to initiate intrinsic strengthening. Gave pt cervical precautions sheet and reviewed in example with BADL. Recommend he follow up with OP OT at d/c, but will continue to follow acutely per POC listed below.   Issued pt resistive exercise ball for hand and built up silverware.    Follow Up Recommendations  Outpatient OT    Equipment Recommendations  None recommended by OT    Recommendations for Other Services       Precautions / Restrictions Precautions Precautions: Cervical Precaution Booklet Issued: Yes (comment) Precaution Comments: reviewed in context of BADL Required Braces or Orthoses: Cervical Brace Cervical Brace: Soft collar;Other (comment) Restrictions Weight Bearing Restrictions: No Other Position/Activity Restrictions: okay to remove collar in bed; okay to walk to bathroom without collar      Mobility Bed  Mobility               General bed mobility comments: up in hall walking with nursing at start of session, then to chair  Transfers Overall transfer level: Needs assistance Equipment used: None Transfers: Sit to/from Stand Sit to Stand: Supervision              Balance Overall balance assessment: No apparent balance deficits (not formally assessed)                                         ADL either performed or assessed with clinical judgement   ADL Overall ADL's : Needs assistance/impaired Eating/Feeding: Independent   Grooming: Independent   Upper Body Bathing: Minimal assistance;Sitting   Lower Body Bathing: Min guard;Sitting/lateral leans;Sit to/from stand   Upper Body Dressing : Minimal assistance;Sitting   Lower Body Dressing: Min guard;Sitting/lateral leans;Sit to/from stand   Toilet Transfer: Min guard   Toileting- Water quality scientist and Hygiene: Min guard   Tub/ Banker: Min guard   Functional mobility during ADLs: Min guard       Vision Patient Visual Report: No change from baseline Vision Assessment?: No apparent visual deficits     Perception     Praxis      Pertinent Vitals/Pain Pain Assessment: 0-10 Pain Score: 6  Pain Location: cervical sx site Pain Descriptors / Indicators: Aching;Sore;Radiating Pain Intervention(s): Limited activity within patient's tolerance;Monitored during session;Repositioned     Hand Dominance Right   Extremity/Trunk Assessment Upper Extremity Assessment Upper Extremity Assessment: RUE deficits/detail;LUE deficits/detail  RUE Deficits / Details: tricep weakness; grip strength weakness. Pt able to complete motions normally but residually weak. Reports total numbness in middle finger. First finger partially numb as well as dorsal hand up to forearm RUE Sensation: decreased light touch RUE Coordination: decreased fine motor LUE Deficits / Details: WFL   Lower Extremity  Assessment Lower Extremity Assessment: Overall WFL for tasks assessed(slight "limp" but states he has knee problema nd was stiff from sitting for longer period of time)       Communication Communication Communication: No difficulties   Cognition Arousal/Alertness: Awake/alert Behavior During Therapy: WFL for tasks assessed/performed Overall Cognitive Status: Within Functional Limits for tasks assessed                                     General Comments       Exercises     Shoulder Instructions      Home Living Family/patient expects to be discharged to:: Private residence Living Arrangements: Spouse/significant other;Children Available Help at Discharge: Family Type of Home: House Home Access: Stairs to enter Secretary/administrator of Steps: 3-4   Home Layout: Two level;Able to live on main level with bedroom/bathroom Alternate Level Stairs-Number of Steps: sons room is upstairs, does not need to go up there   Bathroom Shower/Tub: Chief Strategy Officer: Standard     Home Equipment: None          Prior Functioning/Environment Level of Independence: Independent        Comments: self employed in Garment/textile technologist Problem List: Decreased strength;Decreased knowledge of use of DME or AE;Decreased knowledge of precautions;Decreased coordination;Decreased range of motion;Impaired UE functional use;Pain;Impaired sensation      OT Treatment/Interventions: Self-care/ADL training;Manual therapy;Therapeutic exercise;Patient/family education;Neuromuscular education;Therapeutic activities;DME and/or AE instruction    OT Goals(Current goals can be found in the care plan section) Acute Rehab OT Goals Patient Stated Goal: regian RUE strength OT Goal Formulation: With patient Time For Goal Achievement: 01/06/20 Potential to Achieve Goals: Good  OT Frequency: Min 3X/week   Barriers to D/C:            Co-evaluation               AM-PAC OT "6 Clicks" Daily Activity     Outcome Measure Help from another person eating meals?: None Help from another person taking care of personal grooming?: None Help from another person toileting, which includes using toliet, bedpan, or urinal?: None Help from another person bathing (including washing, rinsing, drying)?: A Little Help from another person to put on and taking off regular upper body clothing?: A Little Help from another person to put on and taking off regular lower body clothing?: None 6 Click Score: 22   End of Session Equipment Utilized During Treatment: Cervical collar Nurse Communication: Mobility status;Precautions  Activity Tolerance: Patient tolerated treatment well Patient left: in chair;with call bell/phone within reach  OT Visit Diagnosis: Muscle weakness (generalized) (M62.81);Other symptoms and signs involving the nervous system (R29.898);Pain Pain - part of body: (neck)                Time: 3154-0086 OT Time Calculation (min): 30 min Charges:  OT General Charges $OT Visit: 1 Visit OT Evaluation $OT Eval Moderate Complexity: 1 Mod OT Treatments $Self Care/Home Management : 8-22 mins  Dalphine Handing, MSOT, OTR/L Acute Rehabilitation Services [facility] Office Number: [phone] Pager:  [phone]  Dalphine Handing 12/23/2019, 6:35 PM

## 2019-12-24 LAB — CULTURE, BLOOD (ROUTINE X 2)
Culture: NO GROWTH
Special Requests: ADEQUATE

## 2019-12-24 LAB — HEPATITIS C GENOTYPE

## 2019-12-24 NOTE — Anesthesia Postprocedure Evaluation (Signed)
Anesthesia Post Note  Patient: Michael Lester  Procedure(s) Performed: cervical five-six, cervical six-seven anterior cervical discectomy with interbody fusion (N/A Neck)     Patient location during evaluation: PACU Anesthesia Type: General Level of consciousness: awake and alert Pain management: pain level controlled Vital Signs Assessment: post-procedure vital signs reviewed and stable Respiratory status: spontaneous breathing, nonlabored ventilation, respiratory function stable and patient connected to nasal cannula oxygen Cardiovascular status: blood pressure returned to baseline and stable Postop Assessment: no apparent nausea or vomiting Anesthetic complications: no    Last Vitals:  Vitals:   12/24/19 1115 12/24/19 1540  BP: 104/68 113/62  Pulse: 75 79  Resp: 18 18  Temp: 36.4 C 36.5 C  SpO2: 98% 99%    Last Pain:  Vitals:   12/24/19 1728  TempSrc:   PainSc: 3                  Nicholaus Steinke COKER

## 2019-12-24 NOTE — Progress Notes (Signed)
[facility]  Date of Admission:  12/19/2019      Total days of antibiotics 5  Day 5 vancomycin + Ceftriaxone           ASSESSMENT: Michael Lester is a 34 y.o. male with subacute osteomyelitis involving C6-7 complicated by compressive epidural abscess at C7 nerve root. POD1 anterior decompression with fusion. Phlegmon described but bone did not appear to be badly damaged. No growth from cultures still; may not recover pathogen given >72h antibiotic exposure prior to sampling. Most likely culprit would be staph aureus given his risk factors; will consider adding Rifampin given hardware now in place Sunday/Monday --> this will interfere with pain control so will defer until that point. Continue vancomycin + ceftriaxone IV.   Substance Use Disorder with Injection - (+) Hep C Ab test. NR HIV. Hep B sAg negative and surface Ab c/w immunity. Following for Hep C RNA but would benefit from Hep A vaccine.     PLAN: 1. Continue vancomycin and ceftriaxone 2. Follow micro from surgery   3. Follow Hep C Ab (+) with RNA to determine chronic infection for outpatient management.  4. Hold on PICC line for now    Principal Problem:   Osteomyelitis of C6-7 Active Problems:   Abscess in epidural space of cervical spine   Injection of illicit drug within last 12 months   Syncope   Opioid dependence, daily use (HCC)   Positive hepatitis C antibody test   . enoxaparin (LOVENOX) injection  40 mg Subcutaneous Q24H  . gabapentin  100 mg Oral TID  . nicotine  14 mg Transdermal Daily  . sodium chloride flush  3 mL Intravenous Q12H  . sodium chloride flush  3 mL Intravenous Q12H    SUBJECTIVE: Pain is under better control. Planning removal of hemovac drain today. Tolerating antibiotics well.    Review of Systems: Review of Systems  Constitutional: Negative for chills and fever.  Gastrointestinal: Negative for abdominal pain, diarrhea and vomiting.    Musculoskeletal: Positive for neck pain.  Skin: Negative for itching and rash.    Allergies  Allergen Reactions  . Shellfish Allergy Anaphylaxis    OBJECTIVE: Vitals:   12/24/19 0047 12/24/19 0356 12/24/19 0730 12/24/19 1115  BP: 106/68 107/73 114/80 104/68  Pulse: 91 63 71 75  Resp: 20 16 18 18   Temp: 97.9 F (36.6 C) 97.6 F (36.4 C) 97.7 F (36.5 C) 97.6 F (36.4 C)  TempSrc: Oral Oral Oral   SpO2: 97% 98% 100% 98%  Weight:      Height:       Body mass index is 20.8 kg/m.  Physical Exam Constitutional:      Comments: Trying to sleep upright in bed.   HENT:     Mouth/Throat:     Mouth: Mucous membranes are moist.     Pharynx: No oropharyngeal exudate.  Eyes:     General: No scleral icterus.    Pupils: Pupils are equal, round, and reactive to light.  Cardiovascular:     Rate and Rhythm: Normal rate and regular rhythm.     Heart sounds: No murmur.  Pulmonary:     Effort: Pulmonary effort is normal.     Breath sounds: Normal breath sounds.  Abdominal:     General: There is no distension.     Tenderness: There is no abdominal tenderness.  Skin:    General: Skin is warm and dry.  Capillary Refill: Capillary refill takes less than 2 seconds.  Neurological:     Mental Status: He is oriented to person, place, and time.     Lab Results Lab Results  Component Value Date   WBC 4.9 12/23/2019   HGB 11.8 (L) 12/23/2019   HCT 35.5 (L) 12/23/2019   MCV 79.1 (L) 12/23/2019   PLT 292 12/23/2019    Lab Results  Component Value Date   CREATININE 0.98 12/23/2019   BUN 18 12/23/2019   NA 137 12/23/2019   K 4.5 12/23/2019   CL 106 12/23/2019   CO2 21 (L) 12/23/2019    Lab Results  Component Value Date   ALT 13 12/20/2019   AST 10 (L) 12/20/2019   ALKPHOS 89 12/20/2019   BILITOT 0.6 12/20/2019     Microbiology: Recent Results (from the past 240 hour(s))  Culture, blood (routine x 2)     Status: None   Collection Time: 12/19/19  3:41 PM   Specimen:  BLOOD  Result Value Ref [place] Status   Specimen Description BLOOD  R FOREARM  Final   Special Requests   Final    BOTTLES DRAWN AEROBIC AND ANAEROBIC Blood Culture adequate volume   Culture   Final    NO GROWTH 5 DAYS Performed at [facility], [address] [place], [place] [address]    Report Status 12/24/2019 FINAL  Final  Culture, blood (routine x 2)     Status: Abnormal   Collection Time: 12/19/19  3:41 PM   Specimen: BLOOD  Result Value Ref [place] Status   Specimen Description BLOOD  L FOREARM  Final   Special Requests   Final    BOTTLES DRAWN AEROBIC AND ANAEROBIC Blood Culture adequate volume   Culture  Setup Time   Final    GRAM POSITIVE COCCI IN BOTH AEROBIC AND ANAEROBIC BOTTLES CRITICAL RESULT CALLED TO, READ BACK BY AND VERIFIED WITH: JEENA PIGG AND V BRYAK AT 1914 ON 12/20/19 SNG    Culture (A)  Final    STAPHYLOCOCCUS SPECIES (COAGULASE NEGATIVE) THE SIGNIFICANCE OF ISOLATING THIS ORGANISM FROM A SINGLE SET OF BLOOD CULTURES WHEN MULTIPLE SETS ARE DRAWN IS UNCERTAIN. PLEASE NOTIFY THE MICROBIOLOGY DEPARTMENT WITHIN ONE WEEK IF SPECIATION AND SENSITIVITIES ARE REQUIRED.    Report Status 12/21/2019 FINAL  Final  Blood Culture ID Panel (Reflexed)     Status: Abnormal   Collection Time: 12/19/19  3:41 PM  Result Value Ref [place] Status   Enterococcus species NOT DETECTED NOT DETECTED Final   Listeria monocytogenes NOT DETECTED NOT DETECTED Final   Staphylococcus species DETECTED (A) NOT DETECTED Final    Comment: Methicillin (oxacillin) resistant coagulase negative staphylococcus. Possible blood culture contaminant (unless isolated from more than one blood culture draw or clinical case suggests pathogenicity). No antibiotic treatment is indicated for blood  culture contaminants. CRITICAL RESULT CALLED TO, READ BACK BY AND VERIFIED WITH: KAREN PATTON AT 0944 ON 12/20/19 SNG    Staphylococcus aureus (BCID) NOT DETECTED NOT DETECTED Final   Methicillin  resistance DETECTED (A) NOT DETECTED Final    Comment: CRITICAL RESULT CALLED TO, READ BACK BY AND VERIFIED WITH: KAREN PATTON AT 0944 ON 12/20/19 SNG    Streptococcus species NOT DETECTED NOT DETECTED Final   Streptococcus agalactiae NOT DETECTED NOT DETECTED Final   Streptococcus pneumoniae NOT DETECTED NOT DETECTED Final   Streptococcus pyogenes NOT DETECTED NOT DETECTED Final   Acinetobacter baumannii NOT DETECTED NOT DETECTED Final   Enterobacteriaceae species NOT DETECTED  NOT DETECTED Final   Enterobacter cloacae complex NOT DETECTED NOT DETECTED Final   Escherichia coli NOT DETECTED NOT DETECTED Final   Klebsiella oxytoca NOT DETECTED NOT DETECTED Final   Klebsiella pneumoniae NOT DETECTED NOT DETECTED Final   Proteus species NOT DETECTED NOT DETECTED Final   Serratia marcescens NOT DETECTED NOT DETECTED Final   Haemophilus influenzae NOT DETECTED NOT DETECTED Final   Neisseria meningitidis NOT DETECTED NOT DETECTED Final   Pseudomonas aeruginosa NOT DETECTED NOT DETECTED Final   Candida albicans NOT DETECTED NOT DETECTED Final   Candida glabrata NOT DETECTED NOT DETECTED Final   Candida krusei NOT DETECTED NOT DETECTED Final   Candida parapsilosis NOT DETECTED NOT DETECTED Final   Candida tropicalis NOT DETECTED NOT DETECTED Final    Comment: Performed at [facility], [address] [place], [place] [address]  Respiratory Panel by RT PCR (Flu A&B, Covid) - Nasopharyngeal Swab     Status: None   Collection Time: 12/19/19  5:43 PM   Specimen: Nasopharyngeal Swab  Result Value [place] Status   SARS Coronavirus 2 by RT PCR NEGATIVE NEGATIVE Final    Comment: (NOTE) SARS-CoV-2 target nucleic acids are NOT DETECTED. The SARS-CoV-2 RNA is generally detectable in upper respiratoy specimens during the acute phase of infection. The lowest concentration of SARS-CoV-2 viral copies this assay can detect is 131 copies/mL. A negative result does not preclude  SARS-Cov-2 infection and should not be used as the sole basis for treatment or other patient management decisions. A negative result may occur with  improper specimen collection/handling, submission of specimen other than nasopharyngeal swab, presence of viral mutation(s) within the areas targeted by this assay, and inadequate number of viral copies (<131 copies/mL). A negative result must be combined with clinical observations, patient history, and epidemiological information. The expected result is Negative. Fact Sheet for Patients:  https://www.moore.com/ Fact Sheet for Healthcare Providers:  https://www.young.biz/ This test is not yet ap proved or cleared by the [place] FDA and  has been authorized for detection and/or diagnosis of SARS-CoV-2 by FDA under an Emergency Use Authorization (EUA). This EUA will remain  in effect (meaning this test can be used) for the duration of the COVID-19 declaration under Section 564(b)(1) of the Act, 21 U.S.C. section 360bbb-3(b)(1), unless the authorization is terminated or revoked sooner.    Influenza A by PCR NEGATIVE NEGATIVE Final   Influenza B by PCR NEGATIVE NEGATIVE Final    Comment: (NOTE) The Xpert Xpress SARS-CoV-2/FLU/RSV assay is intended as an aid in  the diagnosis of influenza from Nasopharyngeal swab specimens and  should not be used as a sole basis for treatment. Nasal washings and  aspirates are unacceptable for Xpert Xpress SARS-CoV-2/FLU/RSV  testing. Fact Sheet for Patients: https://www.moore.com/ Fact Sheet for Healthcare Providers: https://www.young.biz/ This test is not yet approved or cleared by the [place] FDA and  has been authorized for detection and/or diagnosis of SARS-CoV-2 by  FDA under an Emergency Use Authorization (EUA). This EUA will remain  in effect (meaning this test can be used) for the duration of the  Covid-19  declaration under Section 564(b)(1) of the Act, 21  U.S.C. section 360bbb-3(b)(1), unless the authorization is  terminated or revoked. Performed at [facility], [address] [place], [place] [address]   MRSA PCR Screening     Status: None   Collection Time: 12/20/19 12:54 AM   Specimen: Nasal Mucosa; Nasopharyngeal  Result Value [place] Status   MRSA by  PCR NEGATIVE NEGATIVE Final    Comment:        The GeneXpert MRSA Assay (FDA approved for NASAL specimens only), is one component of a comprehensive MRSA colonization surveillance program. It is not intended to diagnose MRSA infection nor to guide or monitor treatment for MRSA infections. Performed at [facility], [address] [place], [place] [address]   Culture, blood (Routine X 2) w Reflex to ID Panel     Status: None (Preliminary result)   Collection Time: 12/20/19  1:07 PM   Specimen: BLOOD LEFT FOREARM  Result Value Ref [place] Status   Specimen Description BLOOD LEFT FOREARM  Final   Special Requests   Final    BOTTLES DRAWN AEROBIC AND ANAEROBIC Blood Culture adequate volume   Culture   Final    NO GROWTH 3 DAYS Performed at [facility], [address] [place], [place] [address]    Report Status PENDING  Incomplete  Culture, blood (Routine X 2) w Reflex to ID Panel     Status: None (Preliminary result)   Collection Time: 12/20/19  1:16 PM   Specimen: BLOOD LEFT FOREARM  Result Value Ref [place] Status   Specimen Description BLOOD LEFT FOREARM  Final   Special Requests   Final    BOTTLES DRAWN AEROBIC AND ANAEROBIC Blood Culture adequate volume   Culture   Final    NO GROWTH 3 DAYS Performed at [facility], [address], [place], [place] [address]    Report Status PENDING  Incomplete  Aerobic Culture (superficial specimen)     Status: None (Preliminary result)   Collection Time: 12/22/19  7:20 PM   Specimen: Wound; Abscess  Result Value [place] Status   Specimen  Description WOUND CERVICAL DISC SPACE  Final   Special Requests PATIENT ON FOLLOWING ROCEPHIN AND VANC  Final   Gram Stain   Final    RARE WBC PRESENT, PREDOMINANTLY PMN NO ORGANISMS SEEN    Culture   Final    NO GROWTH 2 DAYS Performed at [facility], [address] [place], [place] [address]    Report Status PENDING  Incomplete     Rexene Alberts, MSN, NP-C [facility]  [email or web address] Pager: [phone] Office: [phone] [facility] Main Line: [phone]

## 2019-12-24 NOTE — Progress Notes (Signed)
PROGRESS NOTE  KASSEN FINE QMG:[id number] DOB: [birth year]   PCP: Patient, [name]  Patient is from: home  DOA: 12/19/2019 LOS: 5  Brief Narrative / Interim history: 34 year old male with history of IVDU presented to [facility] after found unresponsive and complaining of headache, neck pain, right shoulder pain and paresthesia in his right arm fingers.  He was septic with tachycardia and leukocytosis.  CT head and cervical spine without acute finding but abnormal lucency at the adjacent endplates of C6-7.  MRI cervical spine showed discitis and osteomyelitis at C6-7 with prevertebral edema and phlegmon resulting in spinal stenosis and cord mass-effect without definite cord signal abnormality and drainable fluid collection.  Resuscitated with IV fluids and started on cefepime, vancomycin and Flagyl.  NS at [facility] consulted by EDP I recommended admission to hospitalist service [facility] hospital for further treatment.  Blood culture at [facility] coag negative methicillin-resistant staph in 1 out of 2 bottles.  ID consulted and felt this to be contaminant.  Repeat blood culture here NGTD.  Patient underwent ACDF by neurosurgery on 12/22/2019.  Remains on vancomycin and ceftriaxone per ID pending tissue cultures.  Subjective: No major events overnight or this morning. He says he has not had a good sleep due to back pain.  Feels better this morning after pain medication.  He rates his pain 6/10.  Reports improvement in his right arm weakness and numbness.  Drain output 200 cc in the last 24 hours.  Has not had bowel movement yet.  Objective: Vitals:   12/23/19 2038 12/24/19 0047 12/24/19 0356 12/24/19 0730  BP: 114/74 106/68 107/73 114/80  Pulse: 90 91 63 71  Resp: 18 20 16 18   Temp: 98.1 F (36.7 C) 97.9 F (36.6 C) 97.6 F (36.4 C) 97.7 F (36.5 C)  TempSrc: Oral Oral Oral Oral  SpO2: 97% 97% 98% 100%  Weight:      Height:        Intake/Output Summary (Last 24 hours) at 12/24/2019 1045 Last data filed  at 12/24/2019 0800 Gross per 24 hour  Intake 720 ml  Output 1100 ml  Net -380 ml   Filed Weights   12/20/19 0135  Weight: 71.5 kg    Examination:  GENERAL: No acute distress.  Appears well.  HEENT: MMM.  Vision and hearing grossly intact.  NECK: In a soft collar. RESP:  No IWOB. Good air movement bilaterally. CVS:  RRR. Heart sounds normal.  ABD/GI/GU: Bowel sounds present. Soft. Non tender.  MSK/EXT:  Moves extremities. No apparent deformity. No edema.  SKIN: no apparent skin lesion or wound NEURO: Awake, alert and oriented appropriately.  Grip 4+/5 in right arm, 5/5 in left arm.  Motor intact and symmetric in BLE.  Light sensation intact in all dermatomes. PSYCH: Calm. Normal affect.  Procedures:  2/23-ACDF by Dr. Jordan Likes  Assessment & Plan: Sepsis due to discitis/osteomyelitis in patient with history of IVDU C6-7 discitis/osteomyelitis with RUE paresthesia and weakness -ACDF by Dr. Jordan Likes on 2/23.  -Initial blood culture with coag neg methicillin-resistant staph in 1 out of 2 bottles-felt to be contaminant -Repeat blood culture and tissue cultures NGTD -Neurosurgery and ID following-appreciate care and guidance -On vancomycin and ceftriaxone-plan for 6 to 8 weeks of IV antibiotics --PICC line?-May have to complete IV antibiotics inpatient due to history of IVDU  Abnormal blood culture: Initial blood culture at [facility] with coag negative methicillin-resistant staph in 1 out of 2 bottles.  Felt to be contaminant.  Repeat blood cultures NGTD.  TTE without significant finding.  Recurrent syncope: Due to drug use?  EKG with ST but no other significant finding.  Echo unremarkable. -PT/OT  IVDU-Daily heroin use -Encourage cessation -May consider Suboxone if withdrawal symptoms -CSW consulted for resources and counseling  Normocytic anemia: Likely ACD.  H&H stable -Continue monitoring  Constipation -Has Senokot-S and MiraLAX twice daily as needed.  Encouraged him to ask for  this                DVT prophylaxis: Subcu Lovenox Code Status: Full code Family Communication: Patient and/or Charity fundraiser. Available if any question.  Discharge barrier: IV antibiotics for osteomyelitis/discitis in patient with IVDU Patient is from: Home Final disposition: To be determined.  Patient on IV antibiotics.  History of IVDU.  Difficult disposition  Consultants: Neurosurgery, ID   Microbiology summarized: 2/20-blood culture with coag negative methicillin-resistant staph in 1 out of 2 bottles 2/21- Blood cultures NGTD 2/21-MRSA PCR negative 2/23-tissue cultures NGTD  Sch Meds:  Scheduled Meds: . enoxaparin (LOVENOX) injection  40 mg Subcutaneous Q24H  . gabapentin  100 mg Oral TID  . nicotine  14 mg Transdermal Daily  . sodium chloride flush  3 mL Intravenous Q12H  . sodium chloride flush  3 mL Intravenous Q12H   Continuous Infusions: . sodium chloride 250 mL (12/22/19 2244)  . cefTRIAXone (ROCEPHIN)  IV 2 g (12/24/19 0002)  . lactated ringers 200 mL/hr at 12/22/19 2004  . vancomycin 1,250 mg (12/24/19 0834)   PRN Meds:.acetaminophen **OR** acetaminophen, cyclobenzaprine, HYDROcodone-acetaminophen, HYDROcodone-acetaminophen, HYDROmorphone (DILAUDID) injection, menthol-cetylpyridinium **OR** phenol, morphine injection, ondansetron **OR** ondansetron (ZOFRAN) IV, polyethylene glycol, senna-docusate, sodium chloride flush  Antimicrobials: Anti-infectives (From admission, onward)   Start     Dose/Rate Route Frequency Ordered Stop   12/22/19 1938  bacitracin 50,000 Units in sodium chloride 0.9 % 500 mL irrigation  Status:  Discontinued       As needed 12/22/19 1939 12/22/19 2048   12/22/19 1821  ceFAZolin (ANCEF) 2-4 GM/100ML-% IVPB    Note to Pharmacy: Little Ishikawa   : cabinet override      12/22/19 1821 12/23/19 0629   12/22/19 1000  vancomycin (VANCOREADY) IVPB 1250 mg/250 mL     1,250 mg 166.7 mL/hr over 90 Minutes Intravenous Every 12 hours 12/21/19 2301      12/20/19 0600  vancomycin (VANCOREADY) IVPB 1750 mg/350 mL  Status:  Discontinued     1,750 mg 175 mL/hr over 120 Minutes Intravenous Every 12 hours 12/20/19 0042 12/21/19 2301   12/20/19 0030  cefTRIAXone (ROCEPHIN) 2 g in sodium chloride 0.9 % 100 mL IVPB     2 g 200 mL/hr over 30 Minutes Intravenous Every 24 hours 12/20/19 0027         I have personally reviewed the following labs and images: CBC: Recent Labs  Lab 12/19/19 1515 12/19/19 1515 12/20/19 0439 12/20/19 1314 12/21/19 0452 12/22/19 0650 12/23/19 0629  WBC 14.4*   < > 6.1 5.7 5.7 5.6 4.9  NEUTROABS 12.6*  --  3.6  --   --   --   --   HGB 11.3*   < > 9.8* 11.1* 11.1* 11.1* 11.8*  HCT 35.1*   < > 29.9* 34.0* 33.3* 34.0* 35.5*  MCV 81.6   < > 81.3 80.6 79.3* 80.4 79.1*  PLT 325   < > 225 241 274 251 292   < > = values in this interval not displayed.   BMP &GFR Recent Labs  Lab 12/19/19 1515 12/19/19 1515  12/20/19 0439 12/20/19 1314 12/21/19 0452 12/22/19 0650 12/23/19 0629  NA 138  --  137  --  137 138 137  K 4.7  --  3.8  --  3.6 3.8 4.5  CL 105  --  103  --  102 106 106  CO2 24  --  26  --  24 22 21*  GLUCOSE 195*  --  88  --  125* 106* 149*  BUN 23*  --  16  --  12 13 18   CREATININE 1.19   < > 0.99 1.08 0.95 1.03 0.98  CALCIUM 9.0  --  8.7*  --  8.6* 8.9 9.1  MG  --   --  1.6*  --  2.0 1.8  --    < > = values in this interval not displayed.   Estimated Creatinine Clearance: 106.4 mL/min (by C-G formula based on SCr of 0.98 mg/dL). Liver & Pancreas: Recent Labs  Lab 12/19/19 1515 12/20/19 0439  AST 17 10*  ALT 15 13  ALKPHOS 112 89  BILITOT 0.2* 0.6  PROT 8.4* 6.2*  ALBUMIN 3.6 2.6*   No results for input(s): LIPASE, AMYLASE in the last 168 hours. No results for input(s): AMMONIA in the last 168 hours. Diabetic: No results for input(s): HGBA1C in the last 72 hours. No results for input(s): GLUCAP in the last 168 hours. Cardiac Enzymes: Recent Labs  Lab 12/19/19 1515  CKTOTAL 86   No  results for input(s): PROBNP in the last 8760 hours. Coagulation Profile: Recent Labs  Lab 12/19/19 1743  INR 1.2   Thyroid Function Tests: No results for input(s): TSH, T4TOTAL, FREET4, T3FREE, THYROIDAB in the last 72 hours. Lipid Profile: No results for input(s): CHOL, HDL, LDLCALC, TRIG, CHOLHDL, LDLDIRECT in the last 72 hours. Anemia Panel: No results for input(s): VITAMINB12, FOLATE, FERRITIN, TIBC, IRON, RETICCTPCT in the last 72 hours. Urine analysis: No results found for: COLORURINE, APPEARANCEUR, LABSPEC, PHURINE, GLUCOSEU, HGBUR, BILIRUBINUR, KETONESUR, PROTEINUR, UROBILINOGEN, NITRITE, LEUKOCYTESUR Sepsis Labs: Invalid input(s): PROCALCITONIN, LACTICIDVEN  Microbiology: Recent Results (from the past 240 hour(s))  Culture, blood (routine x 2)     Status: None   Collection Time: 12/19/19  3:41 PM   Specimen: BLOOD  Result Value Ref Range Status   Specimen Description BLOOD  R FOREARM  Final   Special Requests   Final    BOTTLES DRAWN AEROBIC AND ANAEROBIC Blood Culture adequate volume   Culture   Final    NO GROWTH 5 DAYS Performed at [facility], [address] [place], [place] [address]    Report Status 12/24/2019 FINAL  Final  Culture, blood (routine x 2)     Status: Abnormal   Collection Time: 12/19/19  3:41 PM   Specimen: BLOOD  Result Value Ref Range Status   Specimen Description BLOOD  L FOREARM  Final   Special Requests   Final    BOTTLES DRAWN AEROBIC AND ANAEROBIC Blood Culture adequate volume   Culture  Setup Time   Final    GRAM POSITIVE COCCI IN BOTH AEROBIC AND ANAEROBIC BOTTLES CRITICAL RESULT CALLED TO, READ BACK BY AND VERIFIED WITH: JEENA PIGG AND V BRYAK AT 0719 ON 12/20/19 SNG    Culture (A)  Final    STAPHYLOCOCCUS SPECIES (COAGULASE NEGATIVE) THE SIGNIFICANCE OF ISOLATING THIS ORGANISM FROM A SINGLE SET OF BLOOD CULTURES WHEN MULTIPLE SETS ARE DRAWN IS UNCERTAIN. PLEASE NOTIFY THE MICROBIOLOGY DEPARTMENT WITHIN ONE WEEK IF  SPECIATION AND SENSITIVITIES ARE REQUIRED.  Report Status 12/21/2019 FINAL  Final  Blood Culture ID Panel (Reflexed)     Status: Abnormal   Collection Time: 12/19/19  3:41 PM  Result Value Ref Range Status   Enterococcus species NOT DETECTED NOT DETECTED Final   Listeria monocytogenes NOT DETECTED NOT DETECTED Final   Staphylococcus species DETECTED (A) NOT DETECTED Final    Comment: Methicillin (oxacillin) resistant coagulase negative staphylococcus. Possible blood culture contaminant (unless isolated from more than one blood culture draw or clinical case suggests pathogenicity). No antibiotic treatment is indicated for blood  culture contaminants. CRITICAL RESULT CALLED TO, READ BACK BY AND VERIFIED WITH: KAREN PATTON AT 0944 ON 12/20/19 SNG    Staphylococcus aureus (BCID) NOT DETECTED NOT DETECTED Final   Methicillin resistance DETECTED (A) NOT DETECTED Final    Comment: CRITICAL RESULT CALLED TO, READ BACK BY AND VERIFIED WITH: KAREN PATTON AT 0944 ON 12/20/19 SNG    Streptococcus species NOT DETECTED NOT DETECTED Final   Streptococcus agalactiae NOT DETECTED NOT DETECTED Final   Streptococcus pneumoniae NOT DETECTED NOT DETECTED Final   Streptococcus pyogenes NOT DETECTED NOT DETECTED Final   Acinetobacter baumannii NOT DETECTED NOT DETECTED Final   Enterobacteriaceae species NOT DETECTED NOT DETECTED Final   Enterobacter cloacae complex NOT DETECTED NOT DETECTED Final   Escherichia coli NOT DETECTED NOT DETECTED Final   Klebsiella oxytoca NOT DETECTED NOT DETECTED Final   Klebsiella pneumoniae NOT DETECTED NOT DETECTED Final   Proteus species NOT DETECTED NOT DETECTED Final   Serratia marcescens NOT DETECTED NOT DETECTED Final   Haemophilus influenzae NOT DETECTED NOT DETECTED Final   Neisseria meningitidis NOT DETECTED NOT DETECTED Final   Pseudomonas aeruginosa NOT DETECTED NOT DETECTED Final   Candida albicans NOT DETECTED NOT DETECTED Final   Candida glabrata NOT DETECTED  NOT DETECTED Final   Candida krusei NOT DETECTED NOT DETECTED Final   Candida parapsilosis NOT DETECTED NOT DETECTED Final   Candida tropicalis NOT DETECTED NOT DETECTED Final    Comment: Performed at [facility], [address] [place], [place] [address]  Respiratory Panel by RT PCR (Flu A&B, Covid) - Nasopharyngeal Swab     Status: None   Collection Time: 12/19/19  5:43 PM   Specimen: Nasopharyngeal Swab  Result Value Ref Range Status   SARS Coronavirus 2 by RT PCR NEGATIVE NEGATIVE Final    Comment: (NOTE) SARS-CoV-2 target nucleic acids are NOT DETECTED. The SARS-CoV-2 RNA is generally detectable in upper respiratoy specimens during the acute phase of infection. The lowest concentration of SARS-CoV-2 viral copies this assay can detect is 131 copies/mL. A negative result does not preclude SARS-Cov-2 infection and should not be used as the sole basis for treatment or other patient management decisions. A negative result may occur with  improper specimen collection/handling, submission of specimen other than nasopharyngeal swab, presence of viral mutation(s) within the areas targeted by this assay, and inadequate number of viral copies (<131 copies/mL). A negative result must be combined with clinical observations, patient history, and epidemiological information. The expected result is Negative. Fact Sheet for Patients:  PinkCheek.be Fact Sheet for Healthcare Providers:  GravelBags.it This test is not yet ap proved or cleared by the [place] FDA and  has been authorized for detection and/or diagnosis of SARS-CoV-2 by FDA under an Emergency Use Authorization (EUA). This EUA will remain  in effect (meaning this test can be used) for the duration of the COVID-19 declaration under Section 564(b)(1) of the Act, 21 U.S.C. section 360bbb-3(b)(1), unless  the authorization is terminated or revoked sooner.     Influenza A by PCR NEGATIVE NEGATIVE Final   Influenza B by PCR NEGATIVE NEGATIVE Final    Comment: (NOTE) The Xpert Xpress SARS-CoV-2/FLU/RSV assay is intended as an aid in  the diagnosis of influenza from Nasopharyngeal swab specimens and  should not be used as a sole basis for treatment. Nasal washings and  aspirates are unacceptable for Xpert Xpress SARS-CoV-2/FLU/RSV  testing. Fact Sheet for Patients: https://www.moore.com/ Fact Sheet for Healthcare Providers: https://www.young.biz/ This test is not yet approved or cleared by the [place] FDA and  has been authorized for detection and/or diagnosis of SARS-CoV-2 by  FDA under an Emergency Use Authorization (EUA). This EUA will remain  in effect (meaning this test can be used) for the duration of the  Covid-19 declaration under Section 564(b)(1) of the Act, 21  U.S.C. section 360bbb-3(b)(1), unless the authorization is  terminated or revoked. Performed at [facility], [address] [place], [place] [address]   MRSA PCR Screening     Status: None   Collection Time: 12/20/19 12:54 AM   Specimen: Nasal Mucosa; Nasopharyngeal  Result Value Ref Range Status   MRSA by PCR NEGATIVE NEGATIVE Final    Comment:        The GeneXpert MRSA Assay (FDA approved for NASAL specimens only), is one component of a comprehensive MRSA colonization surveillance program. It is not intended to diagnose MRSA infection nor to guide or monitor treatment for MRSA infections. Performed at [facility], [address] [place], [place] [address]   Culture, blood (Routine X 2) w Reflex to ID Panel     Status: None (Preliminary result)   Collection Time: 12/20/19  1:07 PM   Specimen: BLOOD LEFT FOREARM  Result Value Ref Range Status   Specimen Description BLOOD LEFT FOREARM  Final   Special Requests   Final    BOTTLES DRAWN AEROBIC AND ANAEROBIC Blood Culture adequate volume   Culture    Final    NO GROWTH 3 DAYS Performed at [facility], [address], [place], [place] [address]    Report Status PENDING  Incomplete  Culture, blood (Routine X 2) w Reflex to ID Panel     Status: None (Preliminary result)   Collection Time: 12/20/19  1:16 PM   Specimen: BLOOD LEFT FOREARM  Result Value Ref Range Status   Specimen Description BLOOD LEFT FOREARM  Final   Special Requests   Final    BOTTLES DRAWN AEROBIC AND ANAEROBIC Blood Culture adequate volume   Culture   Final    NO GROWTH 3 DAYS Performed at [facility], [address] [place], [place] [address]    Report Status PENDING  Incomplete  Aerobic Culture (superficial specimen)     Status: None (Preliminary result)   Collection Time: 12/22/19  7:20 PM   Specimen: Wound; Abscess  Result Value Ref Range Status   Specimen Description WOUND CERVICAL DISC SPACE  Final   Special Requests PATIENT ON FOLLOWING ROCEPHIN AND VANC  Final   Gram Stain   Final    RARE WBC PRESENT, PREDOMINANTLY PMN NO ORGANISMS SEEN    Culture   Final    NO GROWTH < 24 HOURS Performed at [facility], [address] [place], [place] [address]    Report Status PENDING  Incomplete    Radiology Studies: No results found.   Bryndle Corredor [facility]ist  If 7PM-7AM, please contact night-coverage [email or web address] Password [facility] 12/24/2019, 10:45 AM

## 2019-12-24 NOTE — Progress Notes (Signed)
Postop day 2.  Pain much better controlled.  Patient does note some posterior cervical pain.  Denies any upper extremity pain.  He is afebrile.  His vital signs are stable.  Drain output is moderate.  He is awake and alert.  He is oriented and reasonably appropriate.  Motor examination with significant improvement in his right triceps and grip strength grading at 4+/5.  Sensory examination also improved.  Wound clean and dry.  Overall progressing well.  Continue rehab efforts and IV antibiotics.  We will remove drain.

## 2019-12-25 ENCOUNTER — Encounter: Payer: Self-pay | Admitting: *Deleted

## 2019-12-25 LAB — AEROBIC CULTURE W GRAM STAIN (SUPERFICIAL SPECIMEN): Culture: NO GROWTH

## 2019-12-25 LAB — VANCOMYCIN, TROUGH: Vancomycin Tr: 13 ug/mL — ABNORMAL LOW (ref 15–20)

## 2019-12-25 LAB — VANCOMYCIN, PEAK: Vancomycin Pk: 28 ug/mL — ABNORMAL LOW (ref 30–40)

## 2019-12-25 MED ORDER — HYDROMORPHONE HCL 1 MG/ML IJ SOLN: 0.5000 mg | INTRAMUSCULAR | Status: DC | PRN

## 2019-12-25 MED ORDER — RIFAMPIN 300 MG PO CAPS
300.0000 mg | ORAL_CAPSULE | Freq: Two times a day (BID) | ORAL | Status: DC
  Administered 2019-12-26 – 2020-01-24 (×55): 300 mg via ORAL
  Filled 2019-12-25 (×61): qty 1

## 2019-12-25 NOTE — Progress Notes (Signed)
Occupational Therapy Treatment Patient Details Name: Michael Lester MRN: [id number] DOB: 07-Sep-1986 Today's Date: 12/25/2019    History of present illness Michael Lester is a 34 y.o. male with medical history significant for IV heroin abuse, now presenting to the emergency department after he was found unresponsive and complaining of headache and neck pain.  Patient reports 1 to 2 weeks of pain near the base of his neck that radiates to the right shoulder and has been associated with numbness involving the right second and third fingers. Pt now s/p C5-7 ACDF due to osteomyeltis discitis with epidural abscess.   OT comments  Pt making steady progress towards OT goals this session. Session focus on functional mobility and RUE therex to increase gross/ fine motor coordination for ADL/ IADL participation. Pt completed therex as indicated below. Provided pt with written HEP to increase carryover for [facility] and GM therex. Pt completed functional mobility in hallway greater than a household distance with min guard assist. DC plan currently remains appropriate, will follow acutely per POC for OT needs.    Follow Up Recommendations  Outpatient OT    Equipment Recommendations  None recommended by OT    Recommendations for Other Services      Precautions / Restrictions Precautions Precautions: Cervical Precaution Booklet Issued: Yes (comment) Precaution Comments: reviewed in context of BADL Required Braces or Orthoses: Cervical Brace Cervical Brace: Soft collar;Other (comment) Restrictions Weight Bearing Restrictions: No Other Position/Activity Restrictions: Cleared by Dr. Jordan Likes for gentle UE exercises with theraband.       Mobility Bed Mobility Overal bed mobility: Modified Independent             General bed mobility comments: no assist needed  Transfers Overall transfer level: Needs assistance Equipment used: None Transfers: Sit to/from Stand Sit to Stand: Supervision          General transfer comment: supervision for safety    Balance Overall balance assessment: No apparent balance deficits (not formally assessed)                                         ADL either performed or assessed with clinical judgement   ADL Overall ADL's : Needs assistance/impaired                     Lower Body Dressing: Supervision/safety;Sitting/lateral leans Lower Body Dressing Details (indicate cue type and reason): able to don socks via figure four Toilet Transfer: Min guard;Ambulation Toilet Transfer Details (indicate cue type and reason): simulated via functional mobility with no AD, min guard to keep gown closed         Functional mobility during ADLs: Min guard General ADL Comments: session focus on RUE HEP with level 1 theraband and theraputty ( written HEP provided)     Vision       Perception     Praxis      Cognition Arousal/Alertness: Awake/alert Behavior During Therapy: WFL for tasks assessed/performed Overall Cognitive Status: Within Functional Limits for tasks assessed                                          Exercises General Exercises - Upper Extremity Elbow Flexion: AROM;Right;10 reps;Seated;Theraband Theraband Level (Elbow Flexion): Level 1 (Yellow) Elbow Extension: AROM;Right;10 reps;Seated;Theraband Theraband Level (Elbow  Extension): Level 1 (Yellow) Other Exercises Other Exercises: scapular elevation/ depression, protraction/retraction and circumduction seated in chairx 10 reps Other Exercises: tricep extension seated in chair, RUE, level 1 theraband, x 10 reps Other Exercises: chest pull with level 1 theraband, with theraband positioned low at hip height, seated, x 10 reps Other Exercises: issued pt level 1 theraputty and written HEP to increase instrinsic strength   Shoulder Instructions       General Comments issued level 1 theraband and theraputty with written HEP    Pertinent Vitals/  Pain       Pain Assessment: Faces Faces Pain Scale: Hurts little more Pain Location: RUE with sh flexion. New pain in LUE Pain Descriptors / Indicators: Aching;Sore;Radiating;Discomfort;Grimacing Pain Intervention(s): Monitored during session;Other (comment)(downgraded therex)  Home Living                                          Prior Functioning/Environment              Frequency  Min 3X/week        Progress Toward Goals  OT Goals(current goals can now be found in the care plan section)  Progress towards OT goals: Progressing toward goals  Acute Rehab OT Goals Patient Stated Goal: regian RUE strength OT Goal Formulation: With patient Time For Goal Achievement: 01/06/20 Potential to Achieve Goals: Good  Plan Discharge plan remains appropriate    Co-evaluation                 AM-PAC OT "6 Clicks" Daily Activity     Outcome Measure   Help from another person eating meals?: None Help from another person taking care of personal grooming?: None Help from another person toileting, which includes using toliet, bedpan, or urinal?: None Help from another person bathing (including washing, rinsing, drying)?: A Little Help from another person to put on and taking off regular upper body clothing?: A Little Help from another person to put on and taking off regular lower body clothing?: None 6 Click Score: 22    End of Session Equipment Utilized During Treatment: Cervical collar  OT Visit Diagnosis: Muscle weakness (generalized) (M62.81);Other symptoms and signs involving the nervous system (R29.898);Pain   Activity Tolerance Patient tolerated treatment well   Patient Left in chair;with call bell/phone within reach   Nurse Communication Mobility status        Time: 3267-1245 OT Time Calculation (min): 20 min  Charges: OT General Charges $OT Visit: 1 Visit OT Treatments $Therapeutic Exercise: 8-22 mins  Lanier Clam., COTA/L Acute  Rehabilitation Services [phone]   Ihor Gully 12/25/2019, 4:09 PM

## 2019-12-25 NOTE — Progress Notes (Signed)
PROGRESS NOTE  Michael Lester DOB: July 19, 1986   PCP: Patient, [name]  Patient is from: home  DOA: 12/19/2019 LOS: 6  Brief Narrative / Interim history: 34 year old male with history of IVDU presented to [facility] after found unresponsive and complaining of headache, neck pain, right shoulder pain and paresthesia in his right arm fingers.  He was septic with tachycardia and leukocytosis.  CT head and cervical spine without acute finding but abnormal lucency at the adjacent endplates of C6-7.  MRI cervical spine showed discitis and osteomyelitis at C6-7 with prevertebral edema and phlegmon resulting in spinal stenosis and cord mass-effect without definite cord signal abnormality and drainable fluid collection.  Resuscitated with IV fluids and started on cefepime, vancomycin and Flagyl.  NS at [facility] consulted by EDP I recommended admission to hospitalist service [facility] hospital for further treatment.  Blood culture at [facility] coag negative methicillin-resistant staph in 1 out of 2 bottles.  ID consulted and felt this to be contaminant.  Repeat blood culture here NGTD.  Patient underwent ACDF by neurosurgery on 12/22/2019.  Remains on vancomycin and ceftriaxone per ID pending tissue cultures.  Subjective: No major events overnight or this morning.  Still with some pain in his right shoulder.  He rates his pain 6/10.  Reports improvement in his arm strength and numbness.  Has had bowel movement this morning.  Objective: Vitals:   12/24/19 2300 12/25/19 0348 12/25/19 0740 12/25/19 1110  BP: 114/67 (!) 118/97 106/71 104/76  Pulse: 76 74 69 99  Resp: 16 16 18 18   Temp: 98.7 F (37.1 C) 98.4 F (36.9 C) 98.3 F (36.8 C) 97.9 F (36.6 C)  TempSrc: Oral Oral Oral Oral  SpO2: 97% 98% 97% 97%  Weight:      Height:        Intake/Output Summary (Last 24 hours) at 12/25/2019 1118 Last data filed at 12/25/2019 1020 Gross per 24 hour  Intake 486 ml  Output 3000 ml  Net -2514 ml   Filed  Weights   12/20/19 0135  Weight: 71.5 kg    Examination:  GENERAL: No acute distress.  Appears well.  HEENT: MMM.  Vision and hearing grossly intact.  NECK: In a soft collar. RESP:  No IWOB. Good air movement bilaterally. CVS:  RRR. Heart sounds normal.  ABD/GI/GU: Bowel sounds present. Soft. Non tender.  MSK/EXT:  Moves extremities. No apparent deformity. No edema.  SKIN: no apparent skin lesion or wound NEURO: Awake, alert and oriented appropriately.  Grip 4+/5 in right arm.  Intact elsewhere.  Light sensation intact. PSYCH: Calm. Normal affect.   Procedures:  2/23-ACDF by Dr. 12/22/19  Assessment & Plan: Sepsis due to discitis/osteomyelitis in patient with history of IVDU C6-7 discitis/osteomyelitis with RUE paresthesia and weakness -ACDF by Dr. Jordan Likes on 2/23.  -Initial blood culture with coag neg methicillin-resistant staph in 1 out of 2 bottles-felt to be contaminant -Repeat blood culture and tissue cultures NGTD -Neurosurgery and ID following-appreciate care and guidance -On vancomycin and ceftriaxone-plan for 6 to 8 weeks of IV antibiotics -PICC line?-May have to complete IV antibiotics inpatient due to history of IVDU -Pain controlled on oral regimen.  Did not require IV pain med in the last 24 hours.  Abnormal blood culture: Initial blood culture at [facility] with coag negative methicillin-resistant staph in 1 out of 2 bottles.  Felt to be contaminant.  Repeat blood cultures NGTD.  TTE without significant finding.  Recurrent syncope: Due to drug use?  EKG with  ST but no other significant finding.  Echo unremarkable. -PT/OT  IVDU-Daily heroin use -Encouraged cessation -May consider Suboxone -CSW consulted for resources and counseling  Normocytic anemia: Likely ACD.  H&H stable -Continue monitoring  Constipation: Resolved -Continue Senokot-S and MiraLAX twice daily as needed.                DVT prophylaxis: Subcu Lovenox Code Status: Full code Family  Communication: Patient and/or RN. Available if any question.  Discharge barrier: IV antibiotics for osteomyelitis/discitis in patient with IVDU Patient is from: Home Final disposition: To be determined.  Patient on IV antibiotics.  History of IVDU.  Difficult disposition  Consultants: Neurosurgery, ID   Microbiology summarized: 2/20-blood culture with coag negative methicillin-resistant staph in 1 out of 2 bottles 2/21- Blood cultures NGTD 2/21-MRSA PCR negative 2/23-tissue cultures NGTD  Sch Meds:  Scheduled Meds: . enoxaparin (LOVENOX) injection  40 mg Subcutaneous Q24H  . gabapentin  100 mg Oral TID  . nicotine  14 mg Transdermal Daily  . [START ON 12/26/2019] rifampin  300 mg Oral BID WC  . sodium chloride flush  3 mL Intravenous Q12H  . sodium chloride flush  3 mL Intravenous Q12H   Continuous Infusions: . sodium chloride 250 mL (12/22/19 2244)  . cefTRIAXone (ROCEPHIN)  IV 2 g (12/25/19 0119)  . lactated ringers 200 mL/hr at 12/22/19 2004  . vancomycin 1,250 mg (12/24/19 2220)   PRN Meds:.acetaminophen **OR** acetaminophen, cyclobenzaprine, HYDROcodone-acetaminophen, HYDROcodone-acetaminophen, HYDROmorphone (DILAUDID) injection, menthol-cetylpyridinium **OR** phenol, morphine injection, ondansetron **OR** ondansetron (ZOFRAN) IV, polyethylene glycol, senna-docusate, sodium chloride flush  Antimicrobials: Anti-infectives (From admission, onward)   Start     Dose/Rate Route Frequency Ordered Stop   12/26/19 0800  rifampin (RIFADIN) capsule 300 mg     300 mg Oral 2 times daily with meals 12/25/19 1037     12/22/19 1938  bacitracin 50,000 Units in sodium chloride 0.9 % 500 mL irrigation  Status:  Discontinued       As needed 12/22/19 1939 12/22/19 2048   12/22/19 1821  ceFAZolin (ANCEF) 2-4 GM/100ML-% IVPB    Note to Pharmacy: [place]   : cabinet override      12/22/19 1821 12/23/19 0629   12/22/19 1000  vancomycin (VANCOREADY) IVPB 1250 mg/250 mL     1,250 mg 166.7  mL/hr over 90 Minutes Intravenous Every 12 hours 12/21/19 2301     12/20/19 0600  vancomycin (VANCOREADY) IVPB 1750 mg/350 mL  Status:  Discontinued     1,750 mg 175 mL/hr over 120 Minutes Intravenous Every 12 hours 12/20/19 0042 12/21/19 2301   12/20/19 0030  cefTRIAXone (ROCEPHIN) 2 g in sodium chloride 0.9 % 100 mL IVPB     2 g 200 mL/hr over 30 Minutes Intravenous Every 24 hours 12/20/19 0027         I have personally reviewed the following labs and images: CBC: Recent Labs  Lab 12/19/19 1515 12/19/19 1515 12/20/19 0439 12/20/19 1314 12/21/19 0452 12/22/19 0650 12/23/19 0629  WBC 14.4*   < > 6.1 5.7 5.7 5.6 4.9  NEUTROABS 12.6*  --  3.6  --   --   --   --   HGB 11.3*   < > 9.8* 11.1* 11.1* 11.1* 11.8*  HCT 35.1*   < > 29.9* 34.0* 33.3* 34.0* 35.5*  MCV 81.6   < > 81.3 80.6 79.3* 80.4 79.1*  PLT 325   < > 225 241 274 251 292   < > = values in this  interval not displayed.   BMP &GFR Recent Labs  Lab 12/19/19 1515 12/19/19 1515 12/20/19 0439 12/20/19 1314 12/21/19 0452 12/22/19 0650 12/23/19 0629  NA 138  --  137  --  137 138 137  K 4.7  --  3.8  --  3.6 3.8 4.5  CL 105  --  103  --  102 106 106  CO2 24  --  26  --  24 22 21*  GLUCOSE 195*  --  88  --  125* 106* 149*  BUN 23*  --  16  --  12 13 18   CREATININE 1.19   < > 0.99 1.08 0.95 1.03 0.98  CALCIUM 9.0  --  8.7*  --  8.6* 8.9 9.1  MG  --   --  1.6*  --  2.0 1.8  --    < > = values in this interval not displayed.   Estimated Creatinine Clearance: 106.4 mL/min (by C-G formula based on SCr of 0.98 mg/dL). Liver & Pancreas: Recent Labs  Lab 12/19/19 1515 12/20/19 0439  AST 17 10*  ALT 15 13  ALKPHOS 112 89  BILITOT 0.2* 0.6  PROT 8.4* 6.2*  ALBUMIN 3.6 2.6*   No results for input(s): LIPASE, AMYLASE in the last 168 hours. No results for input(s): AMMONIA in the last 168 hours. Diabetic: No results for input(s): HGBA1C in the last 72 hours. No results for input(s): GLUCAP in the last 168  hours. Cardiac Enzymes: Recent Labs  Lab 12/19/19 1515  CKTOTAL 86   No results for input(s): PROBNP in the last 8760 hours. Coagulation Profile: Recent Labs  Lab 12/19/19 1743  INR 1.2   Thyroid Function Tests: No results for input(s): TSH, T4TOTAL, FREET4, T3FREE, THYROIDAB in the last 72 hours. Lipid Profile: No results for input(s): CHOL, HDL, LDLCALC, TRIG, CHOLHDL, LDLDIRECT in the last 72 hours. Anemia Panel: No results for input(s): VITAMINB12, FOLATE, FERRITIN, TIBC, IRON, RETICCTPCT in the last 72 hours. Urine analysis: No results found for: COLORURINE, APPEARANCEUR, LABSPEC, PHURINE, GLUCOSEU, HGBUR, BILIRUBINUR, KETONESUR, PROTEINUR, UROBILINOGEN, NITRITE, LEUKOCYTESUR Sepsis Labs: Invalid input(s): PROCALCITONIN, [place]  Microbiology: Recent Results (from the past 240 hour(s))  Culture, blood (routine x 2)     Status: None   Collection Time: 12/19/19  3:41 PM   Specimen: BLOOD  Result Value Ref Range Status   Specimen Description BLOOD  R FOREARM  Final   Special Requests   Final    BOTTLES DRAWN AEROBIC AND ANAEROBIC Blood Culture adequate volume   Culture   Final    NO GROWTH 5 DAYS Performed at [facility], [address] [place], [place] [address]    Report Status 12/24/2019 FINAL  Final  Culture, blood (routine x 2)     Status: Abnormal   Collection Time: 12/19/19  3:41 PM   Specimen: BLOOD  Result Value Ref Range Status   Specimen Description BLOOD  L FOREARM  Final   Special Requests   Final    BOTTLES DRAWN AEROBIC AND ANAEROBIC Blood Culture adequate volume   Culture  Setup Time   Final    GRAM POSITIVE COCCI IN BOTH AEROBIC AND ANAEROBIC BOTTLES CRITICAL RESULT CALLED TO, READ BACK BY AND VERIFIED WITH: JEENA PIGG AND V BRYAK AT 7902 ON 12/20/19 SNG    Culture (A)  Final    STAPHYLOCOCCUS SPECIES (COAGULASE NEGATIVE) THE SIGNIFICANCE OF ISOLATING THIS ORGANISM FROM A SINGLE SET OF BLOOD CULTURES WHEN MULTIPLE SETS ARE DRAWN  IS UNCERTAIN.  PLEASE NOTIFY THE MICROBIOLOGY DEPARTMENT WITHIN ONE WEEK IF SPECIATION AND SENSITIVITIES ARE REQUIRED.    Report Status 12/21/2019 FINAL  Final  Blood Culture ID Panel (Reflexed)     Status: Abnormal   Collection Time: 12/19/19  3:41 PM  Result Value Ref Range Status   Enterococcus species NOT DETECTED NOT DETECTED Final   Listeria monocytogenes NOT DETECTED NOT DETECTED Final   Staphylococcus species DETECTED (A) NOT DETECTED Final    Comment: Methicillin (oxacillin) resistant coagulase negative staphylococcus. Possible blood culture contaminant (unless isolated from more than one blood culture draw or clinical case suggests pathogenicity). No antibiotic treatment is indicated for blood  culture contaminants. CRITICAL RESULT CALLED TO, READ BACK BY AND VERIFIED WITH: KAREN PATTON AT 0944 ON 12/20/19 SNG    Staphylococcus aureus (BCID) NOT DETECTED NOT DETECTED Final   Methicillin resistance DETECTED (A) NOT DETECTED Final    Comment: CRITICAL RESULT CALLED TO, READ BACK BY AND VERIFIED WITH: KAREN PATTON AT 0944 ON 12/20/19 SNG    Streptococcus species NOT DETECTED NOT DETECTED Final   Streptococcus agalactiae NOT DETECTED NOT DETECTED Final   Streptococcus pneumoniae NOT DETECTED NOT DETECTED Final   Streptococcus pyogenes NOT DETECTED NOT DETECTED Final   Acinetobacter baumannii NOT DETECTED NOT DETECTED Final   Enterobacteriaceae species NOT DETECTED NOT DETECTED Final   Enterobacter cloacae complex NOT DETECTED NOT DETECTED Final   Escherichia coli NOT DETECTED NOT DETECTED Final   Klebsiella oxytoca NOT DETECTED NOT DETECTED Final   Klebsiella pneumoniae NOT DETECTED NOT DETECTED Final   Proteus species NOT DETECTED NOT DETECTED Final   Serratia marcescens NOT DETECTED NOT DETECTED Final   Haemophilus influenzae NOT DETECTED NOT DETECTED Final   Neisseria meningitidis NOT DETECTED NOT DETECTED Final   Pseudomonas aeruginosa NOT DETECTED NOT DETECTED Final    Candida albicans NOT DETECTED NOT DETECTED Final   Candida glabrata NOT DETECTED NOT DETECTED Final   Candida krusei NOT DETECTED NOT DETECTED Final   Candida parapsilosis NOT DETECTED NOT DETECTED Final   Candida tropicalis NOT DETECTED NOT DETECTED Final    Comment: Performed at [facility], [address] [place], [place] [address]  Respiratory Panel by RT PCR (Flu A&B, Covid) - Nasopharyngeal Swab     Status: None   Collection Time: 12/19/19  5:43 PM   Specimen: Nasopharyngeal Swab  Result Value Ref Range Status   SARS Coronavirus 2 by RT PCR NEGATIVE NEGATIVE Final    Comment: (NOTE) SARS-CoV-2 target nucleic acids are NOT DETECTED. The SARS-CoV-2 RNA is generally detectable in upper respiratoy specimens during the acute phase of infection. The lowest concentration of SARS-CoV-2 viral copies this assay can detect is 131 copies/mL. A negative result does not preclude SARS-Cov-2 infection and should not be used as the sole basis for treatment or other patient management decisions. A negative result may occur with  improper specimen collection/handling, submission of specimen other than nasopharyngeal swab, presence of viral mutation(s) within the areas targeted by this assay, and inadequate number of viral copies (<131 copies/mL). A negative result must be combined with clinical observations, patient history, and epidemiological information. The expected result is Negative. Fact Sheet for Patients:  https://www.moore.com/https://www.fda.gov/media/142436/download Fact Sheet for Healthcare Providers:  https://www.young.biz/https://www.fda.gov/media/142435/download This test is not yet ap proved or cleared by the [place] FDA and  has been authorized for detection and/or diagnosis of SARS-CoV-2 by FDA under an Emergency Use Authorization (EUA). This EUA will remain  in effect (meaning this test can be used) for  the duration of the COVID-19 declaration under Section 564(b)(1) of the Act, 21 U.S.C. section  360bbb-3(b)(1), unless the authorization is terminated or revoked sooner.    Influenza A by PCR NEGATIVE NEGATIVE Final   Influenza B by PCR NEGATIVE NEGATIVE Final    Comment: (NOTE) The Xpert Xpress SARS-CoV-2/FLU/RSV assay is intended as an aid in  the diagnosis of influenza from Nasopharyngeal swab specimens and  should not be used as a sole basis for treatment. Nasal washings and  aspirates are unacceptable for Xpert Xpress SARS-CoV-2/FLU/RSV  testing. Fact Sheet for Patients: https://www.moore.com/ Fact Sheet for Healthcare Providers: https://www.young.biz/ This test is not yet approved or cleared by the [place] FDA and  has been authorized for detection and/or diagnosis of SARS-CoV-2 by  FDA under an Emergency Use Authorization (EUA). This EUA will remain  in effect (meaning this test can be used) for the duration of the  Covid-19 declaration under Section 564(b)(1) of the Act, 21  U.S.C. section 360bbb-3(b)(1), unless the authorization is  terminated or revoked. Performed at [facility], [address] [place], [place] [address]   MRSA PCR Screening     Status: None   Collection Time: 12/20/19 12:54 AM   Specimen: Nasal Mucosa; Nasopharyngeal  Result Value Ref Range Status   MRSA by PCR NEGATIVE NEGATIVE Final    Comment:        The GeneXpert MRSA Assay (FDA approved for NASAL specimens only), is one component of a comprehensive MRSA colonization surveillance program. It is not intended to diagnose MRSA infection nor to guide or monitor treatment for MRSA infections. Performed at [facility], [address] [place], [place] [address]   Culture, blood (Routine X 2) w Reflex to ID Panel     Status: None (Preliminary result)   Collection Time: 12/20/19  1:07 PM   Specimen: BLOOD LEFT FOREARM  Result Value Ref Range Status   Specimen Description BLOOD LEFT FOREARM  Final   Special Requests   Final     BOTTLES DRAWN AEROBIC AND ANAEROBIC Blood Culture adequate volume Performed at [facility], [address], [place], [place] [address]    Culture NO GROWTH 4 DAYS  Final   Report Status PENDING  Incomplete  Culture, blood (Routine X 2) w Reflex to ID Panel     Status: None (Preliminary result)   Collection Time: 12/20/19  1:16 PM   Specimen: BLOOD LEFT FOREARM  Result Value Ref Range Status   Specimen Description BLOOD LEFT FOREARM  Final   Special Requests   Final    BOTTLES DRAWN AEROBIC AND ANAEROBIC Blood Culture adequate volume Performed at [facility], [address], [place], [place] [address]    Culture NO GROWTH 4 DAYS  Final   Report Status PENDING  Incomplete  Aerobic Culture (superficial specimen)     Status: None (Preliminary result)   Collection Time: 12/22/19  7:20 PM   Specimen: Wound; Abscess  Result Value Ref Range Status   Specimen Description WOUND CERVICAL DISC SPACE  Final   Special Requests PATIENT ON FOLLOWING ROCEPHIN AND VANC  Final   Gram Stain   Final    RARE WBC PRESENT, PREDOMINANTLY PMN NO ORGANISMS SEEN    Culture   Final    NO GROWTH 2 DAYS Performed at [facility], [address], [place], [place] [address]    Report Status PENDING  Incomplete    Radiology Studies: No results found.  Ladye Macnaughton T. Harim [facility]  If 7PM-7AM, please contact night-coverage [email or web address] Password [email or web address] 12/25/2019, 11:18 AM

## 2019-12-25 NOTE — Progress Notes (Signed)
[facility]  Date of Admission:  12/19/2019      Total days of antibiotics 5  Day 5 vancomycin + Ceftriaxone           ASSESSMENT: Michael Lester is a 34 y.o. male with subacute osteomyelitis involving C6-7 complicated by compressive epidural abscess at C7 nerve root. POD3 anterior decompression with fusion.  No growth from cultures still; may not recover pathogen given >72h antibiotic exposure prior to sampling. Most likely culprit would be staph aureus; will plan on adding Rifampin given hardware over the weekend --> this will interfere with pain control so he may require ~20% increase in dose. Continue vancomycin + ceftriaxone IV - likely this will be the course of therapy recommended.   Opioid Use Disorder, Early Remission = he may be an acceptable candidate for home IV therapy given our discussion today. He would require Q1-2 week follow up if this were the case to ensure all is well and consideration of drug screens. He feels it would not be a temptation for him to have PICC line in place. Will discuss with Dr. Alanda Slim and Home Health team in preparation for possible discharge once cleared by surgery and pain under control.   (+) Hep C Ab test. NR HIV. Hep B sAg negative and surface Ab c/w immunity. Hep C RNA returned at 90 copies --> likely this is a false positive. Will plan to repeat in a few weeks at outpatient follow up.     PLAN: 1. Continue vancomycin and ceftriaxone 2. Plan to add Rifampin in AM BID   3. Will repeat Hep C labs outpatient - may be false positive  4. Further discussions re: consideration for home discharge amongst his medical care team to continue     Principal Problem:   Osteomyelitis of C6-7 Active Problems:   Abscess in epidural space of cervical spine   Injection of illicit drug within last 12 months   Syncope   Opioid dependence, daily use (HCC)   Positive hepatitis C antibody test   . enoxaparin (LOVENOX)  injection  40 mg Subcutaneous Q24H  . gabapentin  100 mg Oral TID  . nicotine  14 mg Transdermal Daily  . [START ON 12/26/2019] rifampin  300 mg Oral BID WC  . sodium chloride flush  3 mL Intravenous Q12H  . sodium chloride flush  3 mL Intravenous Q12H    SUBJECTIVE: Pain is under better control. Working with squeeze ball and feels his hand and arm are regaining sensation and strength nicely. Has been up out of bed walking around. Uncertain as to when discharge is going to be.   He last used illicit substances 4 months ago prior to rehab stay for detox. Used Buprenorphine at first with a taper off. He "did not want to trade one for another."  He is married and lives with his wife and 2 children (3 and 49 y). Working in [employer] regularly prior to this infection.    Review of Systems  Constitutional: Negative for chills and fever.  Gastrointestinal: Negative for abdominal pain, diarrhea and vomiting.  Musculoskeletal: Positive for neck pain.  Skin: Negative for itching and rash.     Allergies  Allergen Reactions  . Shellfish Allergy Anaphylaxis    OBJECTIVE: Vitals:   12/24/19 2300 12/25/19 0348 12/25/19 0740 12/25/19 1110  BP: 114/67 (!) 118/97 106/71 104/76  Pulse: 76 74 69 99  Resp: 16 16 18  18  Temp: 98.7 F (37.1 C) 98.4 F (36.9 C) 98.3 F (36.8 C) 97.9 F (36.6 C)  TempSrc: Oral Oral Oral Oral  SpO2: 97% 98% 97% 97%  Weight:      Height:       Body mass index is 20.8 kg/m.  Physical Exam Constitutional:      Comments: Trying to sleep upright in bed.   HENT:     Mouth/Throat:     Mouth: Mucous membranes are moist.     Pharynx: No oropharyngeal exudate.  Eyes:     General: No scleral icterus.    Pupils: Pupils are equal, round, and reactive to light.  Cardiovascular:     Rate and Rhythm: Normal rate and regular rhythm.     Heart sounds: No murmur.  Pulmonary:     Effort: Pulmonary effort is normal.     Breath sounds: Normal breath sounds.    Abdominal:     General: There is no distension.     Tenderness: There is no abdominal tenderness.  Skin:    General: Skin is warm and dry.     Capillary Refill: Capillary refill takes less than 2 seconds.  Neurological:     Mental Status: He is oriented to person, place, and time.     Lab Results Lab Results  Component Value Date   WBC 4.9 12/23/2019   HGB 11.8 (L) 12/23/2019   HCT 35.5 (L) 12/23/2019   MCV 79.1 (L) 12/23/2019   PLT 292 12/23/2019    Lab Results  Component Value Date   CREATININE 0.98 12/23/2019   BUN 18 12/23/2019   NA 137 12/23/2019   K 4.5 12/23/2019   CL 106 12/23/2019   CO2 21 (L) 12/23/2019    Lab Results  Component Value Date   ALT 13 12/20/2019   AST 10 (L) 12/20/2019   ALKPHOS 89 12/20/2019   BILITOT 0.6 12/20/2019     Microbiology: Recent Results (from the past 240 hour(s))  Culture, blood (routine x 2)     Status: None   Collection Time: 12/19/19  3:41 PM   Specimen: BLOOD  Result Value Ref Range Status   Specimen Description BLOOD  R FOREARM  Final   Special Requests   Final    BOTTLES DRAWN AEROBIC AND ANAEROBIC Blood Culture adequate volume   Culture   Final    NO GROWTH 5 DAYS Performed at [facility], [address] [place], [place] [address]    Report Status 12/24/2019 FINAL  Final  Culture, blood (routine x 2)     Status: Abnormal   Collection Time: 12/19/19  3:41 PM   Specimen: BLOOD  Result Value Ref Range Status   Specimen Description BLOOD  L FOREARM  Final   Special Requests   Final    BOTTLES DRAWN AEROBIC AND ANAEROBIC Blood Culture adequate volume   Culture  Setup Time   Final    GRAM POSITIVE COCCI IN BOTH AEROBIC AND ANAEROBIC BOTTLES CRITICAL RESULT CALLED TO, READ BACK BY AND VERIFIED WITH: JEENA PIGG AND V BRYAK AT 0719 ON 12/20/19 SNG    Culture (A)  Final    STAPHYLOCOCCUS SPECIES (COAGULASE NEGATIVE) THE SIGNIFICANCE OF ISOLATING THIS ORGANISM FROM A SINGLE SET OF BLOOD CULTURES WHEN  MULTIPLE SETS ARE DRAWN IS UNCERTAIN. PLEASE NOTIFY THE MICROBIOLOGY DEPARTMENT WITHIN ONE WEEK IF SPECIATION AND SENSITIVITIES ARE REQUIRED.    Report Status 12/21/2019 FINAL  Final  Blood Culture ID Panel (Reflexed)  Status: Abnormal   Collection Time: 12/19/19  3:41 PM  Result Value Ref Range Status   Enterococcus species NOT DETECTED NOT DETECTED Final   Listeria monocytogenes NOT DETECTED NOT DETECTED Final   Staphylococcus species DETECTED (A) NOT DETECTED Final    Comment: Methicillin (oxacillin) resistant coagulase negative staphylococcus. Possible blood culture contaminant (unless isolated from more than one blood culture draw or clinical case suggests pathogenicity). No antibiotic treatment is indicated for blood  culture contaminants. CRITICAL RESULT CALLED TO, READ BACK BY AND VERIFIED WITH: KAREN PATTON AT 0944 ON 12/20/19 SNG    Staphylococcus aureus (BCID) NOT DETECTED NOT DETECTED Final   Methicillin resistance DETECTED (A) NOT DETECTED Final    Comment: CRITICAL RESULT CALLED TO, READ BACK BY AND VERIFIED WITH: KAREN PATTON AT 0944 ON 12/20/19 SNG    Streptococcus species NOT DETECTED NOT DETECTED Final   Streptococcus agalactiae NOT DETECTED NOT DETECTED Final   Streptococcus pneumoniae NOT DETECTED NOT DETECTED Final   Streptococcus pyogenes NOT DETECTED NOT DETECTED Final   Acinetobacter baumannii NOT DETECTED NOT DETECTED Final   Enterobacteriaceae species NOT DETECTED NOT DETECTED Final   Enterobacter cloacae complex NOT DETECTED NOT DETECTED Final   Escherichia coli NOT DETECTED NOT DETECTED Final   Klebsiella oxytoca NOT DETECTED NOT DETECTED Final   Klebsiella pneumoniae NOT DETECTED NOT DETECTED Final   Proteus species NOT DETECTED NOT DETECTED Final   Serratia marcescens NOT DETECTED NOT DETECTED Final   Haemophilus influenzae NOT DETECTED NOT DETECTED Final   Neisseria meningitidis NOT DETECTED NOT DETECTED Final   Pseudomonas aeruginosa NOT DETECTED NOT  DETECTED Final   Candida albicans NOT DETECTED NOT DETECTED Final   Candida glabrata NOT DETECTED NOT DETECTED Final   Candida krusei NOT DETECTED NOT DETECTED Final   Candida parapsilosis NOT DETECTED NOT DETECTED Final   Candida tropicalis NOT DETECTED NOT DETECTED Final    Comment: Performed at [facility], [address] [place], [place] [address]  Respiratory Panel by RT PCR (Flu A&B, Covid) - Nasopharyngeal Swab     Status: None   Collection Time: 12/19/19  5:43 PM   Specimen: Nasopharyngeal Swab  Result Value Ref Range Status   SARS Coronavirus 2 by RT PCR NEGATIVE NEGATIVE Final    Comment: (NOTE) SARS-CoV-2 target nucleic acids are NOT DETECTED. The SARS-CoV-2 RNA is generally detectable in upper respiratoy specimens during the acute phase of infection. The lowest concentration of SARS-CoV-2 viral copies this assay can detect is 131 copies/mL. A negative result does not preclude SARS-Cov-2 infection and should not be used as the sole basis for treatment or other patient management decisions. A negative result may occur with  improper specimen collection/handling, submission of specimen other than nasopharyngeal swab, presence of viral mutation(s) within the areas targeted by this assay, and inadequate number of viral copies (<131 copies/mL). A negative result must be combined with clinical observations, patient history, and epidemiological information. The expected result is Negative. Fact Sheet for Patients:  https://www.moore.com/ Fact Sheet for Healthcare Providers:  https://www.young.biz/ This test is not yet ap proved or cleared by the [place] FDA and  has been authorized for detection and/or diagnosis of SARS-CoV-2 by FDA under an Emergency Use Authorization (EUA). This EUA will remain  in effect (meaning this test can be used) for the duration of the COVID-19 declaration under Section 564(b)(1) of the Act, 21  U.S.C. section 360bbb-3(b)(1), unless the authorization is terminated or revoked sooner.    Influenza A by PCR NEGATIVE  NEGATIVE Final   Influenza B by PCR NEGATIVE NEGATIVE Final    Comment: (NOTE) The Xpert Xpress SARS-CoV-2/FLU/RSV assay is intended as an aid in  the diagnosis of influenza from Nasopharyngeal swab specimens and  should not be used as a sole basis for treatment. Nasal washings and  aspirates are unacceptable for Xpert Xpress SARS-CoV-2/FLU/RSV  testing. Fact Sheet for Patients: PinkCheek.be Fact Sheet for Healthcare Providers: GravelBags.it This test is not yet approved or cleared by the [place] FDA and  has been authorized for detection and/or diagnosis of SARS-CoV-2 by  FDA under an Emergency Use Authorization (EUA). This EUA will remain  in effect (meaning this test can be used) for the duration of the  Covid-19 declaration under Section 564(b)(1) of the Act, 21  U.S.C. section 360bbb-3(b)(1), unless the authorization is  terminated or revoked. Performed at [facility], [address] [place], [place] [address]   MRSA PCR Screening     Status: None   Collection Time: 12/20/19 12:54 AM   Specimen: Nasal Mucosa; Nasopharyngeal  Result Value Ref Range Status   MRSA by PCR NEGATIVE NEGATIVE Final    Comment:        The GeneXpert MRSA Assay (FDA approved for NASAL specimens only), is one component of a comprehensive MRSA colonization surveillance program. It is not intended to diagnose MRSA infection nor to guide or monitor treatment for MRSA infections. Performed at [facility], [place] [address], [place], [place] [address]   Culture, blood (Routine X 2) w Reflex to ID Panel     Status: None (Preliminary result)   Collection Time: 12/20/19  1:07 PM   Specimen: BLOOD LEFT FOREARM  Result Value Ref Range Status   Specimen Description BLOOD LEFT FOREARM  Final   Special  Requests   Final    BOTTLES DRAWN AEROBIC AND ANAEROBIC Blood Culture adequate volume Performed at [facility], [place] [address], [place], [place] [address]    Culture NO GROWTH 4 DAYS  Final   Report Status PENDING  Incomplete  Culture, blood (Routine X 2) w Reflex to ID Panel     Status: None (Preliminary result)   Collection Time: 12/20/19  1:16 PM   Specimen: BLOOD LEFT FOREARM  Result Value Ref Range Status   Specimen Description BLOOD LEFT FOREARM  Final   Special Requests   Final    BOTTLES DRAWN AEROBIC AND ANAEROBIC Blood Culture adequate volume Performed at [facility], [place] [address] [place], [place] [address]    Culture NO GROWTH 4 DAYS  Final   Report Status PENDING  Incomplete  Aerobic Culture (superficial specimen)     Status: None   Collection Time: 12/22/19  7:20 PM   Specimen: Wound; Abscess  Result Value Ref Range Status   Specimen Description WOUND CERVICAL DISC SPACE  Final   Special Requests PATIENT ON FOLLOWING ROCEPHIN AND VANC  Final   Gram Stain   Final    RARE WBC PRESENT, PREDOMINANTLY PMN NO ORGANISMS SEEN    Culture   Final    NO GROWTH Performed at [facility], [place] [address], [place],  [address]    Report Status 12/25/2019 FINAL  Final     Janene Madeira, MSN, NP-[facility] [email or web address] Pager: [phone] Office: [place]: [phone]

## 2019-12-25 NOTE — Progress Notes (Signed)
Pharmacy Antibiotic Note Michael Lester is a 34 y.o. male admitted on 12/19/2019 with osteomyelitis of the cervical spine. Pharmacy has been consulted for vancomycin dosing.  Pt is currently on vancomycin 1250mg  IV q12hr with VP 28, VT 13, calculated AUC 507. Pt currently therapeutic. Scr, UOP remain stable.    Plan: Continue vancomycin 1250 mg IV q12 hours. Obtain vancomycin peak and trough in one week.  F/u renal function and continued clinical improvement.   Height: 6\' 1"  (185.4 cm) Weight: 157 lb 10.1 oz (71.5 kg) IBW/kg (Calculated) : 79.9  Temp (24hrs), Avg:98.3 F (36.8 C), Min:97.7 F (36.5 C), Max:98.7 F (37.1 C)  Recent Labs  Lab 12/19/19 1515 12/19/19 1541 12/20/19 0439 12/20/19 1314 12/21/19 0452 12/21/19 1700 12/21/19 2137 12/22/19 0650 12/23/19 0629 12/25/19 0204 12/25/19 0820  WBC   < >  --  6.1 5.7 5.7  --   --  5.6 4.9  --   --   CREATININE   < >  --  0.99 1.08 0.95  --   --  1.03 0.98  --   --   LATICACIDVEN  --  1.2  --   --   --   --   --   --   --   --   --   VANCOTROUGH  --   --   --   --   --  13*  --   --   --   --  13*  VANCOPEAK  --   --   --   --   --   --  38  --   --  28*  --    < > = values in this interval not displayed.    Estimated Creatinine Clearance: 106.4 mL/min (by C-G formula based on SCr of 0.98 mg/dL).    Allergies  Allergen Reactions  . Shellfish Allergy Anaphylaxis   12/27/19; PharmD Candidate 12/25/2019 12:07 PM

## 2019-12-25 NOTE — Progress Notes (Signed)
Stop day 3.  Overall progressing well.  Neck pain much improved.  No upper extremity pain.  Motor and sensory function much improved and near normal.  Wound clean and dry.  Progressing well following 2 level anterior cervical decompression fusion.  Continue IV antibiotics and efforts at mobilization.

## 2019-12-26 LAB — RENAL FUNCTION PANEL
Albumin: 3.2 g/dL — ABNORMAL LOW (ref 3.5–5.0)
Anion gap: 9 (ref 5–15)
BUN: 16 mg/dL (ref 6–20)
CO2: 24 mmol/L (ref 22–32)
Calcium: 9.4 mg/dL (ref 8.9–10.3)
Chloride: 103 mmol/L (ref 98–111)
Creatinine, Ser: 0.95 mg/dL (ref 0.61–1.24)
GFR calc Af Amer: >60 mL/min (ref >60)
GFR calc non Af Amer: >60 mL/min (ref >60)
Glucose, Bld: 96 mg/dL (ref 70–99)
Phosphorus: 3.6 mg/dL (ref 2.5–4.6)
Potassium: 4 mmol/L (ref 3.5–5.1)
Sodium: 136 mmol/L (ref 135–145)

## 2019-12-26 LAB — CBC
HCT: 36.1 % — ABNORMAL LOW (ref 39.0–52.0)
Hemoglobin: 11.7 g/dL — ABNORMAL LOW (ref 13.0–17.0)
MCH: 26.3 pg (ref 26.0–34.0)
MCHC: 32.4 g/dL (ref 30.0–36.0)
MCV: 81.1 fL (ref 80.0–100.0)
Platelets: 238 10*3/uL (ref 150–400)
RBC: 4.45 MIL/uL (ref 4.22–5.81)
RDW: 14.1 % (ref 11.5–15.5)
WBC: 6.7 10*3/uL (ref 4.0–10.5)
nRBC: 0 % (ref 0.0–0.2)

## 2019-12-26 LAB — MAGNESIUM: Magnesium: 1.8 mg/dL (ref 1.7–2.4)

## 2019-12-26 NOTE — Progress Notes (Signed)
PROGRESS NOTE  Michael Lester VZC:[id number] DOB: 11/19/1985   PCP: Patient, [name]  Patient is from: home  DOA: 12/19/2019 LOS: 7  Brief Narrative / Interim history: 34 year old male with history of IVDU presented to [facility] after found unresponsive and complaining of headache, neck pain, right shoulder pain and paresthesia in his right arm fingers.  He was septic with tachycardia and leukocytosis.  CT head and cervical spine without acute finding but abnormal lucency at the adjacent endplates of C6-7.  MRI cervical spine showed discitis and osteomyelitis at C6-7 with prevertebral edema and phlegmon resulting in spinal stenosis and cord mass-effect without definite cord signal abnormality and drainable fluid collection.  Resuscitated with IV fluids and started on cefepime, vancomycin and Flagyl.  NS at [facility] consulted by EDP I recommended admission to hospitalist service [facility] hospital for further treatment.  Blood culture at [facility] coag negative methicillin-resistant staph in 1 out of 2 bottles.  ID consulted and felt this to be contaminant.  Repeat blood culture here NGTD.  Patient underwent ACDF by neurosurgery on 12/22/2019.  Remains on vancomycin and ceftriaxone per ID pending tissue cultures.  Subjective: No major events overnight or this morning.  No complaints.  Pain fairly controlled.  Right arm weakness basically resolved.  Numbness improved.  Objective: Vitals:   12/25/19 2312 12/26/19 0410 12/26/19 0755 12/26/19 1129  BP: 111/70 109/65  107/67  Pulse: 94 71  85  Resp: 16 18 18    Temp: 98.1 F (36.7 C) (!) 97.5 F (36.4 C) 97.8 F (36.6 C) 98.3 F (36.8 C)  TempSrc: Oral Oral Oral Oral  SpO2: 99% 99%  99%  Weight:      Height:        Intake/Output Summary (Last 24 hours) at 12/26/2019 1347 Last data filed at 12/26/2019 0826 Gross per 24 hour  Intake 240 ml  Output 1020 ml  Net -780 ml   Filed Weights   12/20/19 0135  Weight: 71.5 kg    Examination:  GENERAL: No  acute distress.  Appears well.  HEENT: MMM.  Vision and hearing grossly intact.  NECK: In soft neck collar. RESP:  No IWOB. Good air movement bilaterally. CVS:  RRR. Heart sounds normal.  ABD/GI/GU: Bowel sounds present. Soft. Non tender.  MSK/EXT:  Moves extremities. No apparent deformity. No edema.  SKIN: no apparent skin lesion or wound NEURO: Awake, alert and oriented appropriately.  No apparent focal neuro deficit. PSYCH: Calm. Normal affect.  Procedures:  2/23-ACDF by Dr. 12/22/19  Assessment & Plan: Sepsis due to discitis/osteomyelitis in patient with history of IVDU C6-7 discitis/osteomyelitis with RUE paresthesia and weakness -ACDF by Dr. Jordan Likes on 2/23.  -Initial blood culture with coag neg methicillin-resistant staph in 1 out of 2 bottles-felt to be contaminant -Repeat blood culture and tissue cultures NGTD -Neurosurgery and ID following-appreciate care and guidance -On vancomycin and ceftriaxone-plan for 6 to 8 weeks of IV antibiotics -PICC line and possibly discharge home with [facility].  Patient seems to be in remission for 4 weeks after detox at [facility].  No drug-seeking behavior here. -Pain controlled on oral regimen.  Abnormal blood culture: Initial blood culture at [facility] with coag negative methicillin-resistant staph in 1 out of 2 bottles.  Felt to be contaminant.  Repeat blood cultures NGTD.  TTE without significant finding.  Recurrent syncope: Due to drug use?  EKG with ST but no other significant finding.  Echo unremarkable. -PT/OT  IVDU-last heroin injection was 4 weeks ago.  Reportedly, he  underwent detox at [facility] and sent home on Suboxone taper.  He has been clean since then.  No drug-seeking behavior this hospitalization. -Encouraged to remain clean  Normocytic anemia: Likely ACD.  H&H stable -Continue monitoring  Constipation: Resolved -Continue Senokot-S and MiraLAX twice daily as needed.                DVT prophylaxis: Subcu Lovenox Code Status: Full  code Family Communication: Patient and/or RN. Available if any question.  Discharge barrier: IV antibiotics for osteomyelitis/discitis in patient with IVDU Patient is from: Home Final disposition: To be determined.   Consultants: Neurosurgery, ID   Microbiology summarized: 2/20-blood culture with coag negative methicillin-resistant staph in 1 out of 2 bottles 2/21- Blood cultures NGTD 2/21-MRSA PCR negative 2/23-tissue cultures NGTD  Sch Meds:  Scheduled Meds: . enoxaparin (LOVENOX) injection  40 mg Subcutaneous Q24H  . gabapentin  100 mg Oral TID  . nicotine  14 mg Transdermal Daily  . rifampin  300 mg Oral BID WC  . sodium chloride flush  3 mL Intravenous Q12H  . sodium chloride flush  3 mL Intravenous Q12H   Continuous Infusions: . sodium chloride 250 mL (12/22/19 2244)  . cefTRIAXone (ROCEPHIN)  IV 2 g (12/26/19 0107)  . lactated ringers 200 mL/hr at 12/22/19 2004  . vancomycin 1,250 mg (12/26/19 0913)   PRN Meds:.acetaminophen **OR** acetaminophen, cyclobenzaprine, HYDROcodone-acetaminophen, HYDROcodone-acetaminophen, menthol-cetylpyridinium **OR** phenol, morphine injection, ondansetron **OR** ondansetron (ZOFRAN) IV, polyethylene glycol, senna-docusate, sodium chloride flush  Antimicrobials: Anti-infectives (From admission, onward)   Start     Dose/Rate Route Frequency Ordered Stop   12/26/19 0800  rifampin (RIFADIN) capsule 300 mg     300 mg Oral 2 times daily with meals 12/25/19 1037     12/22/19 1938  bacitracin 50,000 Units in sodium chloride 0.9 % 500 mL irrigation  Status:  Discontinued       As needed 12/22/19 1939 12/22/19 2048   12/22/19 1821  ceFAZolin (ANCEF) 2-4 GM/100ML-% IVPB    Note to Pharmacy: [place]   : cabinet override      12/22/19 1821 12/23/19 0629   12/22/19 1000  vancomycin (VANCOREADY) IVPB 1250 mg/250 mL     1,250 mg 166.7 mL/hr over 90 Minutes Intravenous Every 12 hours 12/21/19 2301     12/20/19 0600  vancomycin (VANCOREADY) IVPB  1750 mg/350 mL  Status:  Discontinued     1,750 mg 175 mL/hr over 120 Minutes Intravenous Every 12 hours 12/20/19 0042 12/21/19 2301   12/20/19 0030  cefTRIAXone (ROCEPHIN) 2 g in sodium chloride 0.9 % 100 mL IVPB     2 g 200 mL/hr over 30 Minutes Intravenous Every 24 hours 12/20/19 0027         I have personally reviewed the following labs and images: CBC: Recent Labs  Lab 12/19/19 1515 12/19/19 1515 12/20/19 0439 12/20/19 0439 12/20/19 1314 12/21/19 0452 12/22/19 0650 12/23/19 0629 12/26/19 0541  WBC 14.4*   < > 6.1   < > 5.7 5.7 5.6 4.9 6.7  NEUTROABS 12.6*  --  3.6  --   --   --   --   --   --   HGB 11.3*   < > 9.8*   < > 11.1* 11.1* 11.1* 11.8* 11.7*  HCT 35.1*   < > 29.9*   < > 34.0* 33.3* 34.0* 35.5* 36.1*  MCV 81.6   < > 81.3   < > 80.6 79.3* 80.4 79.1* 81.1  PLT 325   < >  225   < > 241 274 251 292 238   < > = values in this interval not displayed.   BMP &GFR Recent Labs  Lab 12/20/19 0439 12/20/19 0439 12/20/19 1314 12/21/19 0452 12/22/19 0650 12/23/19 0629 12/26/19 0541  NA 137  --   --  137 138 137 136  K 3.8  --   --  3.6 3.8 4.5 4.0  CL 103  --   --  102 106 106 103  CO2 26  --   --  24 22 21* 24  GLUCOSE 88  --   --  125* 106* 149* 96  BUN 16  --   --  12 13 18 16   CREATININE 0.99   < > 1.08 0.95 1.03 0.98 0.95  CALCIUM 8.7*  --   --  8.6* 8.9 9.1 9.4  MG 1.6*  --   --  2.0 1.8  --  1.8  PHOS  --   --   --   --   --   --  3.6   < > = values in this interval not displayed.   Estimated Creatinine Clearance: 109.8 mL/min (by C-G formula based on SCr of 0.95 mg/dL). Liver & Pancreas: Recent Labs  Lab 12/19/19 1515 12/20/19 0439 12/26/19 0541  AST 17 10*  --   ALT 15 13  --   ALKPHOS 112 89  --   BILITOT 0.2* 0.6  --   PROT 8.4* 6.2*  --   ALBUMIN 3.6 2.6* 3.2*   No results for input(s): LIPASE, AMYLASE in the last 168 hours. No results for input(s): AMMONIA in the last 168 hours. Diabetic: No results for input(s): HGBA1C in the last 72  hours. No results for input(s): GLUCAP in the last 168 hours. Cardiac Enzymes: Recent Labs  Lab 12/19/19 1515  CKTOTAL 86   No results for input(s): PROBNP in the last 8760 hours. Coagulation Profile: Recent Labs  Lab 12/19/19 1743  INR 1.2   Thyroid Function Tests: No results for input(s): TSH, T4TOTAL, FREET4, T3FREE, THYROIDAB in the last 72 hours. Lipid Profile: No results for input(s): CHOL, HDL, LDLCALC, TRIG, CHOLHDL, LDLDIRECT in the last 72 hours. Anemia Panel: No results for input(s): VITAMINB12, FOLATE, FERRITIN, TIBC, IRON, RETICCTPCT in the last 72 hours. Urine analysis: No results found for: COLORURINE, APPEARANCEUR, LABSPEC, PHURINE, GLUCOSEU, HGBUR, BILIRUBINUR, KETONESUR, PROTEINUR, UROBILINOGEN, NITRITE, LEUKOCYTESUR Sepsis Labs: Invalid input(s): PROCALCITONIN, LACTICIDVEN  Microbiology: Recent Results (from the past 240 hour(s))  Culture, blood (routine x 2)     Status: None   Collection Time: 12/19/19  3:41 PM   Specimen: BLOOD  Result Value Ref Range Status   Specimen Description BLOOD  R FOREARM  Final   Special Requests   Final    BOTTLES DRAWN AEROBIC AND ANAEROBIC Blood Culture adequate volume   Culture   Final    NO GROWTH 5 DAYS Performed at [facility], [address] [place], [place]    Report Status 12/24/2019 FINAL  Final  Culture, blood (routine x 2)     Status: Abnormal   Collection Time: 12/19/19  3:41 PM   Specimen: BLOOD  Result Value Ref Range Status   Specimen Description BLOOD  L FOREARM  Final   Special Requests   Final    BOTTLES DRAWN AEROBIC AND ANAEROBIC Blood Culture adequate volume   Culture  Setup Time   Final    GRAM POSITIVE COCCI IN BOTH AEROBIC AND ANAEROBIC BOTTLES  CRITICAL RESULT CALLED TO, READ BACK BY AND VERIFIED WITH: JEENA PIGG AND V BRYAK AT 0719 ON 12/20/19 SNG    Culture (A)  Final    STAPHYLOCOCCUS SPECIES (COAGULASE NEGATIVE) THE SIGNIFICANCE OF ISOLATING THIS ORGANISM FROM A  SINGLE SET OF BLOOD CULTURES WHEN MULTIPLE SETS ARE DRAWN IS UNCERTAIN. PLEASE NOTIFY THE MICROBIOLOGY DEPARTMENT WITHIN ONE WEEK IF SPECIATION AND SENSITIVITIES ARE REQUIRED.    Report Status 12/21/2019 FINAL  Final  Blood Culture ID Panel (Reflexed)     Status: Abnormal   Collection Time: 12/19/19  3:41 PM  Result Value Ref Range Status   Enterococcus species NOT DETECTED NOT DETECTED Final   Listeria monocytogenes NOT DETECTED NOT DETECTED Final   Staphylococcus species DETECTED (A) NOT DETECTED Final    Comment: Methicillin (oxacillin) resistant coagulase negative staphylococcus. Possible blood culture contaminant (unless isolated from more than one blood culture draw or clinical case suggests pathogenicity). No antibiotic treatment is indicated for blood  culture contaminants. CRITICAL RESULT CALLED TO, READ BACK BY AND VERIFIED WITH: KAREN PATTON AT 0944 ON 12/20/19 SNG    Staphylococcus aureus (BCID) NOT DETECTED NOT DETECTED Final   Methicillin resistance DETECTED (A) NOT DETECTED Final    Comment: CRITICAL RESULT CALLED TO, READ BACK BY AND VERIFIED WITH: KAREN PATTON AT 0944 ON 12/20/19 SNG    Streptococcus species NOT DETECTED NOT DETECTED Final   Streptococcus agalactiae NOT DETECTED NOT DETECTED Final   Streptococcus pneumoniae NOT DETECTED NOT DETECTED Final   Streptococcus pyogenes NOT DETECTED NOT DETECTED Final   Acinetobacter baumannii NOT DETECTED NOT DETECTED Final   Enterobacteriaceae species NOT DETECTED NOT DETECTED Final   Enterobacter cloacae complex NOT DETECTED NOT DETECTED Final   Escherichia coli NOT DETECTED NOT DETECTED Final   Klebsiella oxytoca NOT DETECTED NOT DETECTED Final   Klebsiella pneumoniae NOT DETECTED NOT DETECTED Final   Proteus species NOT DETECTED NOT DETECTED Final   Serratia marcescens NOT DETECTED NOT DETECTED Final   Haemophilus influenzae NOT DETECTED NOT DETECTED Final   Neisseria meningitidis NOT DETECTED NOT DETECTED Final    Pseudomonas aeruginosa NOT DETECTED NOT DETECTED Final   Candida albicans NOT DETECTED NOT DETECTED Final   Candida glabrata NOT DETECTED NOT DETECTED Final   Candida krusei NOT DETECTED NOT DETECTED Final   Candida parapsilosis NOT DETECTED NOT DETECTED Final   Candida tropicalis NOT DETECTED NOT DETECTED Final    Comment: Performed at [facility], [address] [place], [place] [address]  Respiratory Panel by RT PCR (Flu A&B, Covid) - Nasopharyngeal Swab     Status: None   Collection Time: 12/19/19  5:43 PM   Specimen: Nasopharyngeal Swab  Result Value Ref Range Status   SARS Coronavirus 2 by RT PCR NEGATIVE NEGATIVE Final    Comment: (NOTE) SARS-CoV-2 target nucleic acids are NOT DETECTED. The SARS-CoV-2 RNA is generally detectable in upper respiratoy specimens during the acute phase of infection. The lowest concentration of SARS-CoV-2 viral copies this assay can detect is 131 copies/mL. A negative result does not preclude SARS-Cov-2 infection and should not be used as the sole basis for treatment or other patient management decisions. A negative result may occur with  improper specimen collection/handling, submission of specimen other than nasopharyngeal swab, presence of viral mutation(s) within the areas targeted by this assay, and inadequate number of viral copies (<131 copies/mL). A negative result must be combined with clinical observations, patient history, and epidemiological information. The expected result is Negative. Fact Sheet for Patients:  https://www.moore.com/https://www.fda.gov/media/142436/download Fact Sheet for Healthcare Providers:  https://www.young.biz/https://www.fda.gov/media/142435/download This test is not yet ap proved or cleared by the [place] FDA and  has been authorized for detection and/or diagnosis of SARS-CoV-2 by FDA under an Emergency Use Authorization (EUA). This EUA will remain  in effect (meaning this test can be used) for the duration of the COVID-19 declaration  under Section 564(b)(1) of the Act, 21 U.S.C. section 360bbb-3(b)(1), unless the authorization is terminated or revoked sooner.    Influenza A by PCR NEGATIVE NEGATIVE Final   Influenza B by PCR NEGATIVE NEGATIVE Final    Comment: (NOTE) The Xpert Xpress SARS-CoV-2/FLU/RSV assay is intended as an aid in  the diagnosis of influenza from Nasopharyngeal swab specimens and  should not be used as a sole basis for treatment. Nasal washings and  aspirates are unacceptable for Xpert Xpress SARS-CoV-2/FLU/RSV  testing. Fact Sheet for Patients: https://www.moore.com/https://www.fda.gov/media/142436/download Fact Sheet for Healthcare Providers: https://www.young.biz/https://www.fda.gov/media/142435/download This test is not yet approved or cleared by the [place] FDA and  has been authorized for detection and/or diagnosis of SARS-CoV-2 by  FDA under an Emergency Use Authorization (EUA). This EUA will remain  in effect (meaning this test can be used) for the duration of the  Covid-19 declaration under Section 564(b)(1) of the Act, 21  U.S.C. section 360bbb-3(b)(1), unless the authorization is  terminated or revoked. Performed at [facility], [address] [place], [place] [address]   MRSA PCR Screening     Status: None   Collection Time: 12/20/19 12:54 AM   Specimen: Nasal Mucosa; Nasopharyngeal  Result Value Ref Range Status   MRSA by PCR NEGATIVE NEGATIVE Final    Comment:        The GeneXpert MRSA Assay (FDA approved for NASAL specimens only), is one component of a comprehensive MRSA colonization surveillance program. It is not intended to diagnose MRSA infection nor to guide or monitor treatment for MRSA infections. Performed at [facility], [address] [place], [place] [address]   Culture, blood (Routine X 2) w Reflex to ID Panel     Status: None (Preliminary result)   Collection Time: 12/20/19  1:07 PM   Specimen: BLOOD LEFT FOREARM  Result Value Ref Range Status   Specimen Description  BLOOD LEFT FOREARM  Final   Special Requests   Final    BOTTLES DRAWN AEROBIC AND ANAEROBIC Blood Culture adequate volume Performed at [facility], [address] [place], [place] [address]    Culture NO GROWTH 4 DAYS  Final   Report Status PENDING  Incomplete  Culture, blood (Routine X 2) w Reflex to ID Panel     Status: None (Preliminary result)   Collection Time: 12/20/19  1:16 PM   Specimen: BLOOD LEFT FOREARM  Result Value Ref Range Status   Specimen Description BLOOD LEFT FOREARM  Final   Special Requests   Final    BOTTLES DRAWN AEROBIC AND ANAEROBIC Blood Culture adequate volume Performed at [facility], [address] [place], [place] [address]    Culture NO GROWTH 4 DAYS  Final   Report Status PENDING  Incomplete  Aerobic Culture (superficial specimen)     Status: None   Collection Time: 12/22/19  7:20 PM   Specimen: Wound; Abscess  Result Value Ref Range Status   Specimen Description WOUND CERVICAL DISC SPACE  Final   Special Requests PATIENT ON FOLLOWING ROCEPHIN AND VANC  Final   Gram Stain   Final  RARE WBC PRESENT, PREDOMINANTLY PMN NO ORGANISMS SEEN    Culture   Final    NO GROWTH Performed at [facility], [address], [place], [place] [address]    Report Status 12/25/2019 FINAL  Final    Radiology Studies: No results found.   Donterius Filley T. Shanitra Phillippi [facility]  If 7PM-7AM, please contact night-coverage [email or web address] Password [facility] 12/26/2019, 1:47 PM

## 2019-12-26 NOTE — Progress Notes (Signed)
Subjective: Patient reports Patient reports improved upper extremity strength numbness and tingling  Objective: Vital signs in last 24 hours: Temp:  [97.5 F (36.4 C)-98.1 F (36.7 C)] 97.8 F (36.6 C) (02/27 0755) Pulse Rate:  [71-99] 71 (02/27 0410) Resp:  [16-18] 18 (02/27 0755) BP: (104-124)/(65-83) 109/65 (02/27 0410) SpO2:  [97 %-100 %] 99 % (02/27 0410)  Intake/Output from previous day: 02/26 0701 - 02/27 0700 In: 486 [P.O.:480; I.V.:6] Out: 1400 [Urine:1400] Intake/Output this shift: Total I/O In: 240 [P.O.:240] Out: 520 [Urine:520]  Awake alert incision clean dry and intact strength stable  Lab Results: Recent Labs    12/26/19 0541  WBC 6.7  HGB 11.7*  HCT 36.1*  PLT 238   BMET Recent Labs    12/26/19 0541  NA 136  K 4.0  CL 103  CO2 24  GLUCOSE 96  BUN 16  CREATININE 0.95  CALCIUM 9.4    Studies/Results: No results found.  Assessment/Plan: Postop day 3 anterior cervical discectomy and fusion continue IV antibiotics no new neurosurgical recommendations  LOS: 7 days     Damita Eppard P 12/26/2019, 8:51 AM

## 2019-12-27 LAB — CREATININE, SERUM
Creatinine, Ser: 0.7 mg/dL (ref 0.61–1.24)
GFR calc Af Amer: >60 mL/min (ref >60)
GFR calc non Af Amer: >60 mL/min (ref >60)

## 2019-12-27 NOTE — Progress Notes (Addendum)
PROGRESS NOTE  Michael Lester UUE:[id number] DOB: November 07, 1985   PCP: Patient, [name]  Patient is from: home  DOA: 12/19/2019 LOS: 8  Brief Narrative / Interim history: 34 year old male with history of IVDU presented to [facility] after found unresponsive and complaining of headache, neck pain, right shoulder pain and paresthesia in his right arm fingers.  He was septic with tachycardia and leukocytosis.  CT head and cervical spine without acute finding but abnormal lucency at the adjacent endplates of C6-7.  MRI cervical spine showed discitis and osteomyelitis at C6-7 with prevertebral edema and phlegmon resulting in spinal stenosis and cord mass-effect without definite cord signal abnormality and drainable fluid collection.  Resuscitated with IV fluids and started on cefepime, vancomycin and Flagyl.  NS at [facility] consulted by EDP I recommended admission to hospitalist service [facility] hospital for further treatment.  Blood culture at [facility] coag negative methicillin-resistant staph in 1 out of 2 bottles.  ID consulted and felt this to be contaminant.  Repeat blood culture here NGTD.  Patient underwent ACDF by neurosurgery on 12/22/2019.  Remains on vancomycin and ceftriaxone per ID pending tissue cultures.  Assessment & Plan: Sepsis due to discitis/osteomyelitis in patient with history of IVDU, present on admission v C6-7 discitis/osteomyelitis with RUE paresthesia and weakness -ACDF by Dr. Jordan Likes on 2/23.  -Initial blood culture with coag neg methicillin-resistant staph in 1 out of 2 bottles-felt to be contaminant -Repeat blood culture and tissue cultures NGTD -Neurosurgery and ID following-appreciate care and guidance -On vancomycin and ceftriaxone-plan for 6 to 8 weeks of IV antibiotics.  Plan was very confusing.  Per hospitalist note, plan was to have PICC line and continue home IV antibiotics and this gentleman with history of IV drug abuse but there is no PICC line placed and patient himself is also  concerned about going home and continuing antibiotics although he seems to be determined not to return and do IV drug abuse again.  Discussed with Dr. Remigio Eisenmenger from infectious disease today and he agrees with keeping patient in the hospital for duration of antibiotics.  Patient seems to be in remission for 4 weeks after detox at [facility].  No drug-seeking behavior here. -Pain controlled on oral regimen.  Recurrent syncope: Due to drug use?  EKG with ST but no other significant finding.  Echo unremarkable. -PT/OT  IVDU-last heroin injection was 4 weeks ago.  Reportedly, he underwent detox at [facility] and sent home on Suboxone taper.  He has been clean since then.  No drug-seeking behavior this hospitalization.  Normocytic anemia: Likely ACD.  H&H stable -Continue monitoring  Constipation: Resolved -Continue Senokot-S and MiraLAX twice daily as needed.                DVT prophylaxis: Subcu Lovenox Code Status: Full code Family Communication: No family member present.  Patient alert, oriented and competent.  Plan of care discussed with patient.  Discharge barrier: IV antibiotics for osteomyelitis/discitis in patient with IVDU Patient is from: Home Final disposition: Home  Consultants: Neurosurgery, ID    Subjective: Patient seen and examined.  He tells me that he had severe neck pain this morning and required pain medication but currently is feeling better.  No other complaint.  Objective: Vitals:   12/26/19 1959 12/26/19 2331 12/27/19 0834 12/27/19 1101  BP: 125/72 119/68 106/72 121/75  Pulse: 91 89 79 90  Resp: 18 18 20    Temp: 98.1 F (36.7 C) 98.5 F (36.9 C) 97.8 F (36.6 C) 97.9 F (  36.6 C)  TempSrc: Oral Oral Oral   SpO2: 100% 97% 100% 99%  Weight:      Height:        Intake/Output Summary (Last 24 hours) at 12/27/2019 1318 Last data filed at 12/27/2019 0900 Gross per 24 hour  Intake 1403 ml  Output 1600 ml  Net -197 ml   Filed Weights   12/20/19 0135  Weight:  71.5 kg    Examination:  General exam: Appears calm and comfortable, neck collar in place Respiratory system: Clear to auscultation. Respiratory effort normal. Cardiovascular system: S1 & S2 heard, RRR. No JVD, murmurs, rubs, gallops or clicks. No pedal edema. Gastrointestinal system: Abdomen is nondistended, soft and nontender. No organomegaly or masses felt. Normal bowel sounds heard. Central nervous system: Alert and oriented. No focal neurological deficits. Extremities: Symmetric 5 x 5 power. Skin: No rashes, lesions or ulcers.  Psychiatry: Judgement and insight appear normal. Mood & affect appropriate.    Procedures:  2/23-ACDF by Dr. Annette Stable   Microbiology summarized: 2/20-blood culture with coag negative methicillin-resistant staph in 1 out of 2 bottles 2/21- Blood cultures NGTD 2/21-MRSA PCR negative 2/23-tissue cultures NGTD  Sch Meds:  Scheduled Meds: . enoxaparin (LOVENOX) injection  40 mg Subcutaneous Q24H  . gabapentin  100 mg Oral TID  . nicotine  14 mg Transdermal Daily  . rifampin  300 mg Oral BID WC  . sodium chloride flush  3 mL Intravenous Q12H  . sodium chloride flush  3 mL Intravenous Q12H   Continuous Infusions: . sodium chloride 250 mL (12/22/19 2244)  . cefTRIAXone (ROCEPHIN)  IV 2 g (12/26/19 2341)  . lactated ringers 200 mL/hr at 12/22/19 2004  . vancomycin 1,250 mg (12/27/19 0851)   PRN Meds:.acetaminophen **OR** acetaminophen, cyclobenzaprine, HYDROcodone-acetaminophen, HYDROcodone-acetaminophen, menthol-cetylpyridinium **OR** phenol, morphine injection, ondansetron **OR** ondansetron (ZOFRAN) IV, polyethylene glycol, senna-docusate, sodium chloride flush  Antimicrobials: Anti-infectives (From admission, onward)   Start     Dose/Rate Route Frequency Ordered Stop   12/26/19 0800  rifampin (RIFADIN) capsule 300 mg     300 mg Oral 2 times daily with meals 12/25/19 1037     12/22/19 1938  bacitracin 50,000 Units in sodium chloride 0.9 % 500 mL  irrigation  Status:  Discontinued       As needed 12/22/19 1939 12/22/19 2048   12/22/19 1821  ceFAZolin (ANCEF) 2-4 GM/100ML-% IVPB    Note to Pharmacy: Rejeana Brock   : cabinet override      12/22/19 1821 12/23/19 0629   12/22/19 1000  vancomycin (VANCOREADY) IVPB 1250 mg/250 mL     1,250 mg 166.7 mL/hr over 90 Minutes Intravenous Every 12 hours 12/21/19 2301     12/20/19 0600  vancomycin (VANCOREADY) IVPB 1750 mg/350 mL  Status:  Discontinued     1,750 mg 175 mL/hr over 120 Minutes Intravenous Every 12 hours 12/20/19 0042 12/21/19 2301   12/20/19 0030  cefTRIAXone (ROCEPHIN) 2 g in sodium chloride 0.9 % 100 mL IVPB     2 g 200 mL/hr over 30 Minutes Intravenous Every 24 hours 12/20/19 0027         I have personally reviewed the following labs and images: CBC: Recent Labs  Lab 12/21/19 0452 12/22/19 0650 12/23/19 0629 12/26/19 0541  WBC 5.7 5.6 4.9 6.7  HGB 11.1* 11.1* 11.8* 11.7*  HCT 33.3* 34.0* 35.5* 36.1*  MCV 79.3* 80.4 79.1* 81.1  PLT 274 251 292 238   BMP &GFR Recent Labs  Lab 12/21/19 0452 12/22/19 0650 12/23/19  3845 12/26/19 0541 12/27/19 0625  NA 137 138 137 136  --   K 3.6 3.8 4.5 4.0  --   CL 102 106 106 103  --   CO2 24 22 21* 24  --   GLUCOSE 125* 106* 149* 96  --   BUN 12 13 18 16   --   CREATININE 0.95 1.03 0.98 0.95 0.70  CALCIUM 8.6* 8.9 9.1 9.4  --   MG 2.0 1.8  --  1.8  --   PHOS  --   --   --  3.6  --    Estimated Creatinine Clearance: 130.3 mL/min (by C-G formula based on SCr of 0.7 mg/dL). Liver & Pancreas: Recent Labs  Lab 12/26/19 0541  ALBUMIN 3.2*   No results for input(s): LIPASE, AMYLASE in the last 168 hours. No results for input(s): AMMONIA in the last 168 hours. Diabetic: No results for input(s): HGBA1C in the last 72 hours. No results for input(s): GLUCAP in the last 168 hours. Cardiac Enzymes: No results for input(s): CKTOTAL, CKMB, CKMBINDEX, TROPONINI in the last 168 hours. No results for input(s): PROBNP in the last  8760 hours. Coagulation Profile: No results for input(s): INR, PROTIME in the last 168 hours. Thyroid Function Tests: No results for input(s): TSH, T4TOTAL, FREET4, T3FREE, THYROIDAB in the last 72 hours. Lipid Profile: No results for input(s): CHOL, HDL, LDLCALC, TRIG, CHOLHDL, LDLDIRECT in the last 72 hours. Anemia Panel: No results for input(s): VITAMINB12, FOLATE, FERRITIN, TIBC, IRON, RETICCTPCT in the last 72 hours. Urine analysis: No results found for: COLORURINE, APPEARANCEUR, LABSPEC, PHURINE, GLUCOSEU, HGBUR, BILIRUBINUR, KETONESUR, PROTEINUR, UROBILINOGEN, NITRITE, LEUKOCYTESUR Sepsis Labs: Invalid input(s): PROCALCITONIN, LACTICIDVEN  Microbiology: Recent Results (from the past 240 hour(s))  Culture, blood (routine x 2)     Status: None   Collection Time: 12/19/19  3:41 PM   Specimen: BLOOD  Result Value Ref Range Status   Specimen Description BLOOD  R FOREARM  Final   Special Requests   Final    BOTTLES DRAWN AEROBIC AND ANAEROBIC Blood Culture adequate volume   Culture   Final    NO GROWTH 5 DAYS Performed at [facility], [address] [place], [place]    Report Status 12/24/2019 FINAL  Final  Culture, blood (routine x 2)     Status: Abnormal   Collection Time: 12/19/19  3:41 PM   Specimen: BLOOD  Result Value Ref Range Status   Specimen Description BLOOD  L FOREARM  Final   Special Requests   Final    BOTTLES DRAWN AEROBIC AND ANAEROBIC Blood Culture adequate volume   Culture  Setup Time   Final    GRAM POSITIVE COCCI IN BOTH AEROBIC AND ANAEROBIC BOTTLES CRITICAL RESULT CALLED TO, READ BACK BY AND VERIFIED WITH: JEENA PIGG AND V BRYAK AT 0719 ON 12/20/19 SNG    Culture (A)  Final    STAPHYLOCOCCUS SPECIES (COAGULASE NEGATIVE) THE SIGNIFICANCE OF ISOLATING THIS ORGANISM FROM A SINGLE SET OF BLOOD CULTURES WHEN MULTIPLE SETS ARE DRAWN IS UNCERTAIN. PLEASE NOTIFY THE MICROBIOLOGY DEPARTMENT WITHIN ONE WEEK IF SPECIATION AND SENSITIVITIES  ARE REQUIRED.    Report Status 12/21/2019 FINAL  Final  Blood Culture ID Panel (Reflexed)     Status: Abnormal   Collection Time: 12/19/19  3:41 PM  Result Value Ref Range Status   Enterococcus species NOT DETECTED NOT DETECTED Final   Listeria monocytogenes NOT DETECTED NOT DETECTED Final   Staphylococcus species DETECTED (A) NOT DETECTED Final  Comment: Methicillin (oxacillin) resistant coagulase negative staphylococcus. Possible blood culture contaminant (unless isolated from more than one blood culture draw or clinical case suggests pathogenicity). No antibiotic treatment is indicated for blood  culture contaminants. CRITICAL RESULT CALLED TO, READ BACK BY AND VERIFIED WITH: KAREN PATTON AT 0944 ON 12/20/19 SNG    Staphylococcus aureus (BCID) NOT DETECTED NOT DETECTED Final   Methicillin resistance DETECTED (A) NOT DETECTED Final    Comment: CRITICAL RESULT CALLED TO, READ BACK BY AND VERIFIED WITH: KAREN PATTON AT 0944 ON 12/20/19 SNG    Streptococcus species NOT DETECTED NOT DETECTED Final   Streptococcus agalactiae NOT DETECTED NOT DETECTED Final   Streptococcus pneumoniae NOT DETECTED NOT DETECTED Final   Streptococcus pyogenes NOT DETECTED NOT DETECTED Final   Acinetobacter baumannii NOT DETECTED NOT DETECTED Final   Enterobacteriaceae species NOT DETECTED NOT DETECTED Final   Enterobacter cloacae complex NOT DETECTED NOT DETECTED Final   Escherichia coli NOT DETECTED NOT DETECTED Final   Klebsiella oxytoca NOT DETECTED NOT DETECTED Final   Klebsiella pneumoniae NOT DETECTED NOT DETECTED Final   Proteus species NOT DETECTED NOT DETECTED Final   Serratia marcescens NOT DETECTED NOT DETECTED Final   Haemophilus influenzae NOT DETECTED NOT DETECTED Final   Neisseria meningitidis NOT DETECTED NOT DETECTED Final   Pseudomonas aeruginosa NOT DETECTED NOT DETECTED Final   Candida albicans NOT DETECTED NOT DETECTED Final   Candida glabrata NOT DETECTED NOT DETECTED Final    Candida krusei NOT DETECTED NOT DETECTED Final   Candida parapsilosis NOT DETECTED NOT DETECTED Final   Candida tropicalis NOT DETECTED NOT DETECTED Final    Comment: Performed at [facility], [address] [place], [place] [address]  Respiratory Panel by RT PCR (Flu A&B, Covid) - Nasopharyngeal Swab     Status: None   Collection Time: 12/19/19  5:43 PM   Specimen: Nasopharyngeal Swab  Result Value Ref Range Status   SARS Coronavirus 2 by RT PCR NEGATIVE NEGATIVE Final    Comment: (NOTE) SARS-CoV-2 target nucleic acids are NOT DETECTED. The SARS-CoV-2 RNA is generally detectable in upper respiratoy specimens during the acute phase of infection. The lowest concentration of SARS-CoV-2 viral copies this assay can detect is 131 copies/mL. A negative result does not preclude SARS-Cov-2 infection and should not be used as the sole basis for treatment or other patient management decisions. A negative result may occur with  improper specimen collection/handling, submission of specimen other than nasopharyngeal swab, presence of viral mutation(s) within the areas targeted by this assay, and inadequate number of viral copies (<131 copies/mL). A negative result must be combined with clinical observations, patient history, and epidemiological information. The expected result is Negative. Fact Sheet for Patients:  https://www.moore.com/ Fact Sheet for Healthcare Providers:  https://www.young.biz/ This test is not yet ap proved or cleared by the [place] FDA and  has been authorized for detection and/or diagnosis of SARS-CoV-2 by FDA under an Emergency Use Authorization (EUA). This EUA will remain  in effect (meaning this test can be used) for the duration of the COVID-19 declaration under Section 564(b)(1) of the Act, 21 U.S.C. section 360bbb-3(b)(1), unless the authorization is terminated or revoked sooner.    Influenza A by PCR NEGATIVE  NEGATIVE Final   Influenza B by PCR NEGATIVE NEGATIVE Final    Comment: (NOTE) The Xpert Xpress SARS-CoV-2/FLU/RSV assay is intended as an aid in  the diagnosis of influenza from Nasopharyngeal swab specimens and  should not be used as a sole basis  for treatment. Nasal washings and  aspirates are unacceptable for Xpert Xpress SARS-CoV-2/FLU/RSV  testing. Fact Sheet for Patients: https://www.moore.com/ Fact Sheet for Healthcare Providers: https://www.young.biz/ This test is not yet approved or cleared by the [place] FDA and  has been authorized for detection and/or diagnosis of SARS-CoV-2 by  FDA under an Emergency Use Authorization (EUA). This EUA will remain  in effect (meaning this test can be used) for the duration of the  Covid-19 declaration under Section 564(b)(1) of the Act, 21  U.S.C. section 360bbb-3(b)(1), unless the authorization is  terminated or revoked. Performed at [facility], [address] [place], [place] [address]   MRSA PCR Screening     Status: None   Collection Time: 12/20/19 12:54 AM   Specimen: Nasal Mucosa; Nasopharyngeal  Result Value Ref Range Status   MRSA by PCR NEGATIVE NEGATIVE Final    Comment:        The GeneXpert MRSA Assay (FDA approved for NASAL specimens only), is one component of a comprehensive MRSA colonization surveillance program. It is not intended to diagnose MRSA infection nor to guide or monitor treatment for MRSA infections. Performed at [facility], [address], [place], [place] [address]   Culture, blood (Routine X 2) w Reflex to ID Panel     Status: None (Preliminary result)   Collection Time: 12/20/19  1:07 PM   Specimen: BLOOD LEFT FOREARM  Result Value Ref Range Status   Specimen Description BLOOD LEFT FOREARM  Final   Special Requests   Final    BOTTLES DRAWN AEROBIC AND ANAEROBIC Blood Culture adequate volume Performed at [facility], [address], [place], [place] [address]    Culture NO GROWTH 4 DAYS  Final   Report Status PENDING  Incomplete  Culture, blood (Routine X 2) w Reflex to ID Panel     Status: None (Preliminary result)   Collection Time: 12/20/19  1:16 PM   Specimen: BLOOD LEFT FOREARM  Result Value Ref Range Status   Specimen Description BLOOD LEFT FOREARM  Final   Special Requests   Final    BOTTLES DRAWN AEROBIC AND ANAEROBIC Blood Culture adequate volume Performed at [facility], [address], [place], [place] [address]    Culture NO GROWTH 4 DAYS  Final   Report Status PENDING  Incomplete  Aerobic Culture (superficial specimen)     Status: None   Collection Time: 12/22/19  7:20 PM   Specimen: Wound; Abscess  Result Value Ref Range Status   Specimen Description WOUND CERVICAL DISC SPACE  Final   Special Requests PATIENT ON FOLLOWING ROCEPHIN AND VANC  Final   Gram Stain   Final    RARE WBC PRESENT, PREDOMINANTLY PMN NO ORGANISMS SEEN    Culture   Final    NO GROWTH Performed at [facility], [address], [place], [place] [address]    Report Status 12/25/2019 FINAL  Final    Radiology Studies: No results found.   Hughie Closs, MD [facility]  If 7PM-7AM, please contact night-coverage [email or web address] 12/27/2019, 1:18 PM

## 2019-12-27 NOTE — Progress Notes (Signed)
Neurosurgery Service Progress Note  Subjective: No acute events overnight, +odynophagia no dysphagia, BUE feel improved from preop   Objective: Vitals:   12/26/19 1636 12/26/19 1959 12/26/19 2331 12/27/19 0834  BP: 115/78 125/72 119/68 106/72  Pulse: 92 91 89 79  Resp:  18 18 20   Temp: 97.7 F (36.5 C) 98.1 F (36.7 C) 98.5 F (36.9 C) 97.8 F (36.6 C)  TempSrc: Oral Oral Oral Oral  SpO2: 100% 100% 97% 100%  Weight:      Height:       Temp (24hrs), Avg:98.1 F (36.7 C), Min:97.7 F (36.5 C), Max:98.5 F (36.9 C)  CBC Latest Ref Rng & Units 12/26/2019 12/23/2019 12/22/2019  WBC 4.0 - 10.5 K/uL 6.7 4.9 5.6  Hemoglobin 13.0 - 17.0 g/dL 11.7(L) 11.8(L) 11.1(L)  Hematocrit 39.0 - 52.0 % 36.1(L) 35.5(L) 34.0(L)  Platelets 150 - 400 K/uL 238 292 251   BMP Latest Ref Rng & Units 12/27/2019 12/26/2019 12/23/2019  Glucose 70 - 99 mg/dL - 96 149(H)  BUN 6 - 20 mg/dL - 16 18  Creatinine 0.61 - 1.24 mg/dL 0.70 0.95 0.98  Sodium 135 - 145 mmol/L - 136 137  Potassium 3.5 - 5.1 mmol/L - 4.0 4.5  Chloride 98 - 111 mmol/L - 103 106  CO2 22 - 32 mmol/L - 24 21(L)  Calcium 8.9 - 10.3 mg/dL - 9.4 9.1    Intake/Output Summary (Last 24 hours) at 12/27/2019 1008 Last data filed at 12/27/2019 0500 Gross per 24 hour  Intake 1043 ml  Output 1300 ml  Net -257 ml    Current Facility-Administered Medications:  .  0.9 %  sodium chloride infusion, 250 mL, Intravenous, Continuous, Gonfa, Taye T, MD, Last Rate: 1 mL/hr at 12/22/19 2244, 250 mL at 12/22/19 2244 .  acetaminophen (TYLENOL) tablet 650 mg, 650 mg, Oral, Q6H PRN, 650 mg at 12/21/19 2013 **OR** acetaminophen (TYLENOL) suppository 650 mg, 650 mg, Rectal, Q6H PRN, Gonfa, Taye T, MD .  cefTRIAXone (ROCEPHIN) 2 g in sodium chloride 0.9 % 100 mL IVPB, 2 g, Intravenous, Q24H, Gonfa, Taye T, MD, Last Rate: 200 mL/hr at 12/26/19 2341, 2 g at 12/26/19 2341 .  cyclobenzaprine (FLEXERIL) tablet 10 mg, 10 mg, Oral, TID PRN, Wendee Beavers T, MD, 10 mg at  12/24/19 0830 .  enoxaparin (LOVENOX) injection 40 mg, 40 mg, Subcutaneous, Q24H, Gonfa, Taye T, MD, 40 mg at 12/26/19 1406 .  gabapentin (NEURONTIN) capsule 100 mg, 100 mg, Oral, TID, Cyndia Skeeters, Taye T, MD, 100 mg at 12/27/19 0846 .  HYDROcodone-acetaminophen (NORCO) 10-325 MG per tablet 2 tablet, 2 tablet, Oral, Q4H PRN, Mercy Riding, MD, 2 tablet at 12/27/19 0846 .  HYDROcodone-acetaminophen (NORCO/VICODIN) 5-325 MG per tablet 1 tablet, 1 tablet, Oral, Q4H PRN, Cyndia Skeeters, Taye T, MD .  lactated ringers infusion, , Intravenous, Continuous, Gonfa, Taye T, MD, Last Rate: 200 mL/hr at 12/22/19 2004, New Bag at 12/22/19 2052 .  menthol-cetylpyridinium (CEPACOL) lozenge 3 mg, 1 lozenge, Oral, PRN **OR** phenol (CHLORASEPTIC) mouth spray 1 spray, 1 spray, Mouth/Throat, PRN, Gonfa, Taye T, MD .  morphine 2 MG/ML injection 2-4 mg, 2-4 mg, Intravenous, Q4H PRN, Cyndia Skeeters, Taye T, MD, 2 mg at 12/23/19 1533 .  nicotine (NICODERM CQ - dosed in mg/24 hours) patch 14 mg, 14 mg, Transdermal, Daily, Cyndia Skeeters, Taye T, MD, 14 mg at 12/27/19 0845 .  ondansetron (ZOFRAN) tablet 4 mg, 4 mg, Oral, Q6H PRN **OR** ondansetron (ZOFRAN) injection 4 mg, 4 mg, Intravenous, Q6H PRN, Gonfa, Taye T,  MD .  polyethylene glycol (MIRALAX / GLYCOLAX) packet 17 g, 17 g, Oral, BID PRN, Alanda Slim, Taye T, MD, 17 g at 12/24/19 0830 .  rifampin (RIFADIN) capsule 300 mg, 300 mg, Oral, BID WC, Judyann Munson, MD, 300 mg at 12/27/19 0846 .  senna-docusate (Senokot-S) tablet 1 tablet, 1 tablet, Oral, BID PRN, Gonfa, Taye T, MD .  sodium chloride flush (NS) 0.9 % injection 3 mL, 3 mL, Intravenous, Q12H, Gonfa, Taye T, MD, 3 mL at 12/27/19 0850 .  sodium chloride flush (NS) 0.9 % injection 3 mL, 3 mL, Intravenous, Q12H, Gonfa, Taye T, MD, 3 mL at 12/27/19 0850 .  sodium chloride flush (NS) 0.9 % injection 3 mL, 3 mL, Intravenous, PRN, Alanda Slim, Taye T, MD, 3 mL at 12/25/19 1019 .  vancomycin (VANCOREADY) IVPB 1250 mg/250 mL, 1,250 mg, Intravenous, Q12H, Gonfa,  Taye T, MD, Last Rate: 166.7 mL/hr at 12/27/19 0851, 1,250 mg at 12/27/19 0851   Physical Exam: Strength 5/5 x4, SILTx4, incision c/d/i  Assessment & Plan: 34 y.o. man s/p C5/6-C6-7 ACDF for infection, recovering well.  -no change in neurosurgical plan of care  Michael Lester  12/27/19 10:08 AM

## 2019-12-28 ENCOUNTER — Inpatient Hospital Stay: Payer: Self-pay

## 2019-12-28 LAB — CBC
HCT: 36.2 % — ABNORMAL LOW (ref 39.0–52.0)
Hemoglobin: 11.7 g/dL — ABNORMAL LOW (ref 13.0–17.0)
MCH: 25.9 pg — ABNORMAL LOW (ref 26.0–34.0)
MCHC: 32.3 g/dL (ref 30.0–36.0)
MCV: 80.1 fL (ref 80.0–100.0)
Platelets: 269 10*3/uL (ref 150–400)
RBC: 4.52 MIL/uL (ref 4.22–5.81)
RDW: 14.1 % (ref 11.5–15.5)
WBC: 6.6 10*3/uL (ref 4.0–10.5)
nRBC: 0 % (ref 0.0–0.2)

## 2019-12-28 LAB — BASIC METABOLIC PANEL
Anion gap: 11 (ref 5–15)
BUN: 19 mg/dL (ref 6–20)
CO2: 23 mmol/L (ref 22–32)
Calcium: 9.5 mg/dL (ref 8.9–10.3)
Chloride: 102 mmol/L (ref 98–111)
Creatinine, Ser: 0.74 mg/dL (ref 0.61–1.24)
GFR calc Af Amer: >60 mL/min (ref >60)
GFR calc non Af Amer: >60 mL/min (ref >60)
Glucose, Bld: 100 mg/dL — ABNORMAL HIGH (ref 70–99)
Potassium: 4.1 mmol/L (ref 3.5–5.1)
Sodium: 136 mmol/L (ref 135–145)

## 2019-12-28 NOTE — Progress Notes (Signed)
OT Cancellation Note  Patient Details Name: Michael Lester MRN: [id number] DOB: [birth year]   Cancelled Treatment:    Reason Eval/Treat Not Completed: Pain limiting ability to participate;Other (comment) Pt reports intense pain this afternoon politely declining OT session. Will check back as time allows.   Audery Amel., COTA/L Acute Rehabilitation Services [phone]   Angelina Pih 12/28/2019, 4:15 PM

## 2019-12-28 NOTE — Progress Notes (Signed)
PROGRESS NOTE  RASMUS PREUSSER HGD:[id number] DOB: 1986-01-09   PCP: Patient, [name]  Patient is from: home  DOA: 12/19/2019 LOS: 9  Brief Narrative / Interim history: 34 year old male with history of IVDU presented to [facility] after found unresponsive and complaining of headache, neck pain, right shoulder pain and paresthesia in his right arm fingers.  He was septic with tachycardia and leukocytosis.  CT head and cervical spine without acute finding but abnormal lucency at the adjacent endplates of C6-7.  MRI cervical spine showed discitis and osteomyelitis at C6-7 with prevertebral edema and phlegmon resulting in spinal stenosis and cord mass-effect without definite cord signal abnormality and drainable fluid collection.  Resuscitated with IV fluids and started on cefepime, vancomycin and Flagyl.  NS at [facility] consulted by EDP I recommended admission to hospitalist service [facility] hospital for further treatment.  Blood culture at [facility] coag negative methicillin-resistant staph in 1 out of 2 bottles.  ID consulted and felt this to be contaminant.  Repeat blood culture here NGTD.  Patient underwent ACDF by neurosurgery on 12/22/2019.  Remains on vancomycin and ceftriaxone per ID pending tissue cultures.  Assessment & Plan: Sepsis due to discitis/osteomyelitis in patient with history of IVDU, present on admission v C6-7 discitis/osteomyelitis with RUE paresthesia and weakness -ACDF by Dr. Jordan Likes on 2/23.  -Initial blood culture with coag neg methicillin-resistant staph in 1 out of 2 bottles-felt to be contaminant -Repeat blood culture and tissue cultures from 12/20/2019 NGTD -Neurosurgery and ID following-appreciate care and guidance -On vancomycin and ceftriaxone and rifampin-plan for 6 to 8 weeks of IV antibiotics.  Plan was very confusing.  Per hospitalist note, plan was to have PICC line and continue home IV antibiotics and this gentleman with history of IV drug abuse but there is no PICC line placed and  patient himself is also concerned about going home and continuing antibiotics although he seems to be determined not to return and do IV drug abuse again.  Discussed with Dr. Remigio Eisenmenger from infectious disease on 12/27/2019 and he agrees with keeping patient in the hospital for duration of antibiotics.  Patient seems to be in remission for 36 weeks after detox at [facility] (not 4 months).  No drug-seeking behavior here. -Pain controlled on oral regimen.  Seen by ID.  Cleared to have PICC line.  Will place order for PICC line placement.  Recurrent syncope: Due to drug use?  EKG with ST but no other significant finding.  Echo unremarkable. -PT/OT  IVDU-last heroin injection was 4 weeks ago.  Reportedly, he underwent detox at [facility] and sent home on Suboxone taper.  He has been clean since then.  No drug-seeking behavior this hospitalization.  Normocytic anemia: Likely ACD.  H&H stable -Continue monitoring  Constipation: Resolved -Continue Senokot-S and MiraLAX twice daily as needed.      DVT prophylaxis: Subcu Lovenox Code Status: Full code Family Communication: No family member present.  Patient alert, oriented and competent.  Plan of care discussed with patient.  Discharge barrier: IV antibiotics for osteomyelitis/discitis in patient with IVDU Patient is from: Home Final disposition: Home  Consultants: Neurosurgery, ID  Subjective: Patient seen and examined.  No new complaint.  Neck pain controlled.  Objective: Vitals:   12/27/19 2358 12/28/19 0409 12/28/19 0759 12/28/19 1150  BP: 127/79 112/77 140/84 113/90  Pulse: 87 86 100 87  Resp: 18 20 20    Temp: 98.2 F (36.8 C) (!) 97.4 F (36.3 C) (!) 97.4 F (36.3 C) 98.2 F (36.8  C)  TempSrc: Oral Oral Oral Oral  SpO2: 100% 98% 99% 99%  Weight:      Height:        Intake/Output Summary (Last 24 hours) at 12/28/2019 1254 Last data filed at 12/28/2019 0700 Gross per 24 hour  Intake 923 ml  Output 775 ml  Net 148 ml   Filed Weights    12/20/19 0135  Weight: 71.5 kg    Examination:  General exam: Appears calm and comfortable, neck collar in place Respiratory system: Clear to auscultation. Respiratory effort normal. Cardiovascular system: S1 & S2 heard, RRR. No JVD, murmurs, rubs, gallops or clicks. No pedal edema. Gastrointestinal system: Abdomen is nondistended, soft and nontender. No organomegaly or masses felt. Normal bowel sounds heard. Central nervous system: Alert and oriented. No focal neurological deficits. Extremities: Symmetric 5 x 5 power. Skin: No rashes, lesions or ulcers.  Psychiatry: Judgement and insight appear normal. Mood & affect appropriate.    Procedures:  2/23-ACDF by Dr. Jordan Likes   Microbiology summarized: 2/20-blood culture with coag negative methicillin-resistant staph in 1 out of 2 bottles 2/21- Blood cultures NGTD 2/21-MRSA PCR negative 2/23-tissue cultures NGTD  Sch Meds:  Scheduled Meds:  gabapentin  100 mg Oral TID   nicotine  14 mg Transdermal Daily   rifampin  300 mg Oral BID WC   sodium chloride flush  3 mL Intravenous Q12H   sodium chloride flush  3 mL Intravenous Q12H   Continuous Infusions:  sodium chloride 250 mL (12/22/19 2244)   cefTRIAXone (ROCEPHIN)  IV 2 g (12/28/19 0019)   lactated ringers 200 mL/hr at 12/22/19 2004   vancomycin 1,250 mg (12/28/19 1159)   PRN Meds:.acetaminophen **OR** acetaminophen, cyclobenzaprine, HYDROcodone-acetaminophen, HYDROcodone-acetaminophen, menthol-cetylpyridinium **OR** phenol, morphine injection, ondansetron **OR** ondansetron (ZOFRAN) IV, polyethylene glycol, senna-docusate, sodium chloride flush  Antimicrobials: Anti-infectives (From admission, onward)   Start     Dose/Rate Route Frequency Ordered Stop   12/26/19 0800  rifampin (RIFADIN) capsule 300 mg     300 mg Oral 2 times daily with meals 12/25/19 1037     12/22/19 1938  bacitracin 50,000 Units in sodium chloride 0.9 % 500 mL irrigation  Status:  Discontinued        As needed 12/22/19 1939 12/22/19 2048   12/22/19 1821  ceFAZolin (ANCEF) 2-4 GM/100ML-% IVPB    Note to Pharmacy: [place]   : cabinet override      12/22/19 1821 12/23/19 0629   12/22/19 1000  vancomycin (VANCOREADY) IVPB 1250 mg/250 mL     1,250 mg 166.7 mL/hr over 90 Minutes Intravenous Every 12 hours 12/21/19 2301     12/20/19 0600  vancomycin (VANCOREADY) IVPB 1750 mg/350 mL  Status:  Discontinued     1,750 mg 175 mL/hr over 120 Minutes Intravenous Every 12 hours 12/20/19 0042 12/21/19 2301   12/20/19 0030  cefTRIAXone (ROCEPHIN) 2 g in sodium chloride 0.9 % 100 mL IVPB     2 g 200 mL/hr over 30 Minutes Intravenous Every 24 hours 12/20/19 0027         I have personally reviewed the following labs and images: CBC: Recent Labs  Lab 12/22/19 0650 12/23/19 0629 12/26/19 0541 12/28/19 0521  WBC 5.6 4.9 6.7 6.6  HGB 11.1* 11.8* 11.7* 11.7*  HCT 34.0* 35.5* 36.1* 36.2*  MCV 80.4 79.1* 81.1 80.1  PLT 251 292 238 269   BMP &GFR Recent Labs  Lab 12/22/19 0650 12/23/19 0629 12/26/19 0541 12/27/19 0625 12/28/19 0521  NA 138 137 136  --  136  K 3.8 4.5 4.0  --  4.1  CL 106 106 103  --  102  CO2 22 21* 24  --  23  GLUCOSE 106* 149* 96  --  100*  BUN 13 18 16   --  19  CREATININE 1.03 0.98 0.95 0.70 0.74  CALCIUM 8.9 9.1 9.4  --  9.5  MG 1.8  --  1.8  --   --   PHOS  --   --  3.6  --   --    Estimated Creatinine Clearance: 130.3 mL/min (by C-G formula based on SCr of 0.74 mg/dL). Liver & Pancreas: Recent Labs  Lab 12/26/19 0541  ALBUMIN 3.2*   No results for input(s): LIPASE, AMYLASE in the last 168 hours. No results for input(s): AMMONIA in the last 168 hours. Diabetic: No results for input(s): HGBA1C in the last 72 hours. No results for input(s): GLUCAP in the last 168 hours. Cardiac Enzymes: No results for input(s): CKTOTAL, CKMB, CKMBINDEX, TROPONINI in the last 168 hours. No results for input(s): PROBNP in the last 8760 hours. Coagulation Profile: No  results for input(s): INR, PROTIME in the last 168 hours. Thyroid Function Tests: No results for input(s): TSH, T4TOTAL, FREET4, T3FREE, THYROIDAB in the last 72 hours. Lipid Profile: No results for input(s): CHOL, HDL, LDLCALC, TRIG, CHOLHDL, LDLDIRECT in the last 72 hours. Anemia Panel: No results for input(s): VITAMINB12, FOLATE, FERRITIN, TIBC, IRON, RETICCTPCT in the last 72 hours. Urine analysis: No results found for: COLORURINE, APPEARANCEUR, LABSPEC, PHURINE, GLUCOSEU, HGBUR, BILIRUBINUR, KETONESUR, PROTEINUR, UROBILINOGEN, NITRITE, LEUKOCYTESUR Sepsis Labs: Invalid input(s): PROCALCITONIN, [place]  Microbiology: Recent Results (from the past 240 hour(s))  Culture, blood (routine x 2)     Status: None   Collection Time: 12/19/19  3:41 PM   Specimen: BLOOD  Result Value Ref Range Status   Specimen Description BLOOD  R FOREARM  Final   Special Requests   Final    BOTTLES DRAWN AEROBIC AND ANAEROBIC Blood Culture adequate volume   Culture   Final    NO GROWTH 5 DAYS Performed at [facility], [address] [place], [place] [address]    Report Status 12/24/2019 FINAL  Final  Culture, blood (routine x 2)     Status: Abnormal   Collection Time: 12/19/19  3:41 PM   Specimen: BLOOD  Result Value Ref Range Status   Specimen Description BLOOD  L FOREARM  Final   Special Requests   Final    BOTTLES DRAWN AEROBIC AND ANAEROBIC Blood Culture adequate volume   Culture  Setup Time   Final    GRAM POSITIVE COCCI IN BOTH AEROBIC AND ANAEROBIC BOTTLES CRITICAL RESULT CALLED TO, READ BACK BY AND VERIFIED WITH: JEENA PIGG AND V BRYAK AT 5638 ON 12/20/19 SNG    Culture (A)  Final    STAPHYLOCOCCUS SPECIES (COAGULASE NEGATIVE) THE SIGNIFICANCE OF ISOLATING THIS ORGANISM FROM A SINGLE SET OF BLOOD CULTURES WHEN MULTIPLE SETS ARE DRAWN IS UNCERTAIN. PLEASE NOTIFY THE MICROBIOLOGY DEPARTMENT WITHIN ONE WEEK IF SPECIATION AND SENSITIVITIES ARE REQUIRED.    Report Status  12/21/2019 FINAL  Final  Blood Culture ID Panel (Reflexed)     Status: Abnormal   Collection Time: 12/19/19  3:41 PM  Result Value Ref Range Status   Enterococcus species NOT DETECTED NOT DETECTED Final   Listeria monocytogenes NOT DETECTED NOT DETECTED Final   Staphylococcus species DETECTED (A) NOT DETECTED Final    Comment: Methicillin (oxacillin) resistant coagulase negative staphylococcus. Possible  blood culture contaminant (unless isolated from more than one blood culture draw or clinical case suggests pathogenicity). No antibiotic treatment is indicated for blood  culture contaminants. CRITICAL RESULT CALLED TO, READ BACK BY AND VERIFIED WITH: KAREN PATTON AT 0944 ON 12/20/19 SNG    Staphylococcus aureus (BCID) NOT DETECTED NOT DETECTED Final   Methicillin resistance DETECTED (A) NOT DETECTED Final    Comment: CRITICAL RESULT CALLED TO, READ BACK BY AND VERIFIED WITH: KAREN PATTON AT 0944 ON 12/20/19 SNG    Streptococcus species NOT DETECTED NOT DETECTED Final   Streptococcus agalactiae NOT DETECTED NOT DETECTED Final   Streptococcus pneumoniae NOT DETECTED NOT DETECTED Final   Streptococcus pyogenes NOT DETECTED NOT DETECTED Final   Acinetobacter baumannii NOT DETECTED NOT DETECTED Final   Enterobacteriaceae species NOT DETECTED NOT DETECTED Final   Enterobacter cloacae complex NOT DETECTED NOT DETECTED Final   Escherichia coli NOT DETECTED NOT DETECTED Final   Klebsiella oxytoca NOT DETECTED NOT DETECTED Final   Klebsiella pneumoniae NOT DETECTED NOT DETECTED Final   Proteus species NOT DETECTED NOT DETECTED Final   Serratia marcescens NOT DETECTED NOT DETECTED Final   Haemophilus influenzae NOT DETECTED NOT DETECTED Final   Neisseria meningitidis NOT DETECTED NOT DETECTED Final   Pseudomonas aeruginosa NOT DETECTED NOT DETECTED Final   Candida albicans NOT DETECTED NOT DETECTED Final   Candida glabrata NOT DETECTED NOT DETECTED Final   Candida krusei NOT DETECTED NOT  DETECTED Final   Candida parapsilosis NOT DETECTED NOT DETECTED Final   Candida tropicalis NOT DETECTED NOT DETECTED Final    Comment: Performed at [facility], [address] [place], [place] [address]  Respiratory Panel by RT PCR (Flu A&B, Covid) - Nasopharyngeal Swab     Status: None   Collection Time: 12/19/19  5:43 PM   Specimen: Nasopharyngeal Swab  Result Value Ref Range Status   SARS Coronavirus 2 by RT PCR NEGATIVE NEGATIVE Final    Comment: (NOTE) SARS-CoV-2 target nucleic acids are NOT DETECTED. The SARS-CoV-2 RNA is generally detectable in upper respiratoy specimens during the acute phase of infection. The lowest concentration of SARS-CoV-2 viral copies this assay can detect is 131 copies/mL. A negative result does not preclude SARS-Cov-2 infection and should not be used as the sole basis for treatment or other patient management decisions. A negative result may occur with  improper specimen collection/handling, submission of specimen other than nasopharyngeal swab, presence of viral mutation(s) within the areas targeted by this assay, and inadequate number of viral copies (<131 copies/mL). A negative result must be combined with clinical observations, patient history, and epidemiological information. The expected result is Negative. Fact Sheet for Patients:  https://www.moore.com/ Fact Sheet for Healthcare Providers:  https://www.young.biz/ This test is not yet ap proved or cleared by the [place] FDA and  has been authorized for detection and/or diagnosis of SARS-CoV-2 by FDA under an Emergency Use Authorization (EUA). This EUA will remain  in effect (meaning this test can be used) for the duration of the COVID-19 declaration under Section 564(b)(1) of the Act, 21 U.S.C. section 360bbb-3(b)(1), unless the authorization is terminated or revoked sooner.    Influenza A by PCR NEGATIVE NEGATIVE Final   Influenza B by  PCR NEGATIVE NEGATIVE Final    Comment: (NOTE) The Xpert Xpress SARS-CoV-2/FLU/RSV assay is intended as an aid in  the diagnosis of influenza from Nasopharyngeal swab specimens and  should not be used as a sole basis for treatment. Nasal washings and  aspirates are  unacceptable for Xpert Xpress SARS-CoV-2/FLU/RSV  testing. Fact Sheet for Patients: https://www.moore.com/ Fact Sheet for Healthcare Providers: https://www.young.biz/ This test is not yet approved or cleared by the [place] FDA and  has been authorized for detection and/or diagnosis of SARS-CoV-2 by  FDA under an Emergency Use Authorization (EUA). This EUA will remain  in effect (meaning this test can be used) for the duration of the  Covid-19 declaration under Section 564(b)(1) of the Act, 21  U.S.C. section 360bbb-3(b)(1), unless the authorization is  terminated or revoked. Performed at [facility], [address] [place], [place] [address]   MRSA PCR Screening     Status: None   Collection Time: 12/20/19 12:54 AM   Specimen: Nasal Mucosa; Nasopharyngeal  Result Value Ref Range Status   MRSA by PCR NEGATIVE NEGATIVE Final    Comment:        The GeneXpert MRSA Assay (FDA approved for NASAL specimens only), is one component of a comprehensive MRSA colonization surveillance program. It is not intended to diagnose MRSA infection nor to guide or monitor treatment for MRSA infections. Performed at [facility], [address], [place], [place] [address]   Culture, blood (Routine X 2) w Reflex to ID Panel     Status: None (Preliminary result)   Collection Time: 12/20/19  1:07 PM   Specimen: BLOOD LEFT FOREARM  Result Value Ref Range Status   Specimen Description BLOOD LEFT FOREARM  Final   Special Requests   Final    BOTTLES DRAWN AEROBIC AND ANAEROBIC Blood Culture adequate volume Performed at [facility], [address], [place], [place]  [address]    Culture NO GROWTH 4 DAYS  Final   Report Status PENDING  Incomplete  Culture, blood (Routine X 2) w Reflex to ID Panel     Status: None (Preliminary result)   Collection Time: 12/20/19  1:16 PM   Specimen: BLOOD LEFT FOREARM  Result Value Ref Range Status   Specimen Description BLOOD LEFT FOREARM  Final   Special Requests   Final    BOTTLES DRAWN AEROBIC AND ANAEROBIC Blood Culture adequate volume Performed at [facility], [address], [place], [place] [address]    Culture NO GROWTH 4 DAYS  Final   Report Status PENDING  Incomplete  Aerobic Culture (superficial specimen)     Status: None   Collection Time: 12/22/19  7:20 PM   Specimen: Wound; Abscess  Result Value Ref Range Status   Specimen Description WOUND CERVICAL DISC SPACE  Final   Special Requests PATIENT ON FOLLOWING ROCEPHIN AND VANC  Final   Gram Stain   Final    RARE WBC PRESENT, PREDOMINANTLY PMN NO ORGANISMS SEEN    Culture   Final    NO GROWTH Performed at [facility], [address] [place], [place] [address]    Report Status 12/25/2019 FINAL  Final    Radiology Studies: No results found.  Total time spent 25 minutes Hughie Closs, MD [facility]  If 7PM-7AM, please contact night-coverage [email or web address] 12/28/2019, 12:54 PM

## 2019-12-28 NOTE — Progress Notes (Signed)
Overall stable from a neurosurgical standpoint.  Continue IV antibiotics.  Mobilize ad lib.  I will not actively follow.  Please call with any new developments.

## 2019-12-28 NOTE — Progress Notes (Signed)
[facility]  Date of Admission:  12/19/2019      Total days of antibiotics 10  Day 10 vancomycin + Ceftriaxone           ASSESSMENT: Michael Lester is a 34 y.o. male with subacute osteomyelitis involving C6-7 complicated by compressive epidural abscess at C7 nerve root now s/p anterior decompression with fusion.  Intraoperative cultures negative, final. Presume staph may be involved and doing well on Rifampin since 2/27.   Opioid Use Disorder, Early Remission = he seems to be an acceptable candidate for home IV therapy given our discussions. He has stable housing. 4 months clean from his report. His pain is adequately controlled on PO regimen currently. Waiting on HomeHealth do further determine ability to arrange this.   (+) Hep C Ab test. NR HIV. Hep B sAg negative and surface Ab c/w immunity. Hep C RNA returned at 90 copies --> likely this is a false positive. Will plan to repeat in a few weeks at outpatient follow up.     PLAN: 1. Continue vancomycin, ceftriaxone + rifampin  2. Will repeat Hep C labs outpatient - may be false positive  3. PICC line OK to place from ID perspective.  4. Waiting from Home Health to determine if he would be candidate for home infusion     Principal Problem:   Osteomyelitis of C6-7 Active Problems:   Abscess in epidural space of cervical spine   Injection of illicit drug within last 12 months   Syncope   Opioid dependence, daily use (HCC)   Positive hepatitis C antibody test   . gabapentin  100 mg Oral TID  . nicotine  14 mg Transdermal Daily  . rifampin  300 mg Oral BID WC  . sodium chloride flush  3 mL Intravenous Q12H  . sodium chloride flush  3 mL Intravenous Q12H    SUBJECTIVE: Pain under better control on oral treatment alone. He finds it is worse when he first wakes up in the morning but the current prescribed treatment is controlling it adequately.  No trouble with antibiotics - no  rashes/diarrhea or nausea.    Review of Systems  Constitutional: Negative for chills and fever.  Gastrointestinal: Negative for abdominal pain, diarrhea and vomiting.  Musculoskeletal: Positive for neck pain.  Skin: Negative for itching and rash.     Allergies  Allergen Reactions  . Shellfish Allergy Anaphylaxis    OBJECTIVE: Vitals:   12/27/19 2358 12/28/19 0409 12/28/19 0759 12/28/19 1150  BP: 127/79 112/77 140/84 113/90  Pulse: 87 86 100 87  Resp: 18 20 20    Temp: 98.2 F (36.8 C) (!) 97.4 F (36.3 C) (!) 97.4 F (36.3 C) 98.2 F (36.8 C)  TempSrc: Oral Oral Oral Oral  SpO2: 100% 98% 99% 99%  Weight:      Height:       Body mass index is 20.8 kg/m.  Physical Exam Constitutional:      Comments: Trying to sleep upright in bed.   HENT:     Mouth/Throat:     Mouth: Mucous membranes are moist.     Pharynx: No oropharyngeal exudate.  Eyes:     General: No scleral icterus.    Pupils: Pupils are equal, round, and reactive to light.  Neck:     Comments: Soft C collar in place. Honeycomb dressing intact without drainage. No swelling or tenderness anteriorly. Some posterior tenderness between shoulder blades and  difficulty swallowing.  Cardiovascular:     Rate and Rhythm: Normal rate and regular rhythm.     Heart sounds: No murmur.  Pulmonary:     Effort: Pulmonary effort is normal.     Breath sounds: Normal breath sounds.  Abdominal:     General: There is no distension.     Tenderness: There is no abdominal tenderness.  Skin:    General: Skin is warm and dry.     Capillary Refill: Capillary refill takes less than 2 seconds.  Neurological:     Mental Status: He is oriented to person, place, and time.     Lab Results Lab Results  Component Value Date   WBC 6.6 12/28/2019   HGB 11.7 (L) 12/28/2019   HCT 36.2 (L) 12/28/2019   MCV 80.1 12/28/2019   PLT 269 12/28/2019    Lab Results  Component Value Date   CREATININE 0.74 12/28/2019   BUN 19 12/28/2019     NA 136 12/28/2019   K 4.1 12/28/2019   CL 102 12/28/2019   CO2 23 12/28/2019    Lab Results  Component Value Date   ALT 13 12/20/2019   AST 10 (L) 12/20/2019   ALKPHOS 89 12/20/2019   BILITOT 0.6 12/20/2019     Microbiology: Recent Results (from the past 240 hour(s))  Culture, blood (routine x 2)     Status: None   Collection Time: 12/19/19  3:41 PM   Specimen: BLOOD  Result Value Ref Range Status   Specimen Description BLOOD  R FOREARM  Final   Special Requests   Final    BOTTLES DRAWN AEROBIC AND ANAEROBIC Blood Culture adequate volume   Culture   Final    NO GROWTH 5 DAYS Performed at [facility], [address] [place], [place] [address]    Report Status 12/24/2019 FINAL  Final  Culture, blood (routine x 2)     Status: Abnormal   Collection Time: 12/19/19  3:41 PM   Specimen: BLOOD  Result Value Ref Range Status   Specimen Description BLOOD  L FOREARM  Final   Special Requests   Final    BOTTLES DRAWN AEROBIC AND ANAEROBIC Blood Culture adequate volume   Culture  Setup Time   Final    GRAM POSITIVE COCCI IN BOTH AEROBIC AND ANAEROBIC BOTTLES CRITICAL RESULT CALLED TO, READ BACK BY AND VERIFIED WITH: JEENA PIGG AND V BRYAK AT 0719 ON 12/20/19 SNG    Culture (A)  Final    STAPHYLOCOCCUS SPECIES (COAGULASE NEGATIVE) THE SIGNIFICANCE OF ISOLATING THIS ORGANISM FROM A SINGLE SET OF BLOOD CULTURES WHEN MULTIPLE SETS ARE DRAWN IS UNCERTAIN. PLEASE NOTIFY THE MICROBIOLOGY DEPARTMENT WITHIN ONE WEEK IF SPECIATION AND SENSITIVITIES ARE REQUIRED.    Report Status 12/21/2019 FINAL  Final  Blood Culture ID Panel (Reflexed)     Status: Abnormal   Collection Time: 12/19/19  3:41 PM  Result Value Ref Range Status   Enterococcus species NOT DETECTED NOT DETECTED Final   Listeria monocytogenes NOT DETECTED NOT DETECTED Final   Staphylococcus species DETECTED (A) NOT DETECTED Final    Comment: Methicillin (oxacillin) resistant coagulase negative staphylococcus.  Possible blood culture contaminant (unless isolated from more than one blood culture draw or clinical case suggests pathogenicity). No antibiotic treatment is indicated for blood  culture contaminants. CRITICAL RESULT CALLED TO, READ BACK BY AND VERIFIED WITH: KAREN PATTON AT 0944 ON 12/20/19 SNG    Staphylococcus aureus (BCID) NOT DETECTED NOT DETECTED Final   Methicillin  resistance DETECTED (A) NOT DETECTED Final    Comment: CRITICAL RESULT CALLED TO, READ BACK BY AND VERIFIED WITH: KAREN PATTON AT 0944 ON 12/20/19 SNG    Streptococcus species NOT DETECTED NOT DETECTED Final   Streptococcus agalactiae NOT DETECTED NOT DETECTED Final   Streptococcus pneumoniae NOT DETECTED NOT DETECTED Final   Streptococcus pyogenes NOT DETECTED NOT DETECTED Final   Acinetobacter baumannii NOT DETECTED NOT DETECTED Final   Enterobacteriaceae species NOT DETECTED NOT DETECTED Final   Enterobacter cloacae complex NOT DETECTED NOT DETECTED Final   Escherichia coli NOT DETECTED NOT DETECTED Final   Klebsiella oxytoca NOT DETECTED NOT DETECTED Final   Klebsiella pneumoniae NOT DETECTED NOT DETECTED Final   Proteus species NOT DETECTED NOT DETECTED Final   Serratia marcescens NOT DETECTED NOT DETECTED Final   Haemophilus influenzae NOT DETECTED NOT DETECTED Final   Neisseria meningitidis NOT DETECTED NOT DETECTED Final   Pseudomonas aeruginosa NOT DETECTED NOT DETECTED Final   Candida albicans NOT DETECTED NOT DETECTED Final   Candida glabrata NOT DETECTED NOT DETECTED Final   Candida krusei NOT DETECTED NOT DETECTED Final   Candida parapsilosis NOT DETECTED NOT DETECTED Final   Candida tropicalis NOT DETECTED NOT DETECTED Final    Comment: Performed at [facility], [address] [place], [place] [address]  Respiratory Panel by RT PCR (Flu A&B, Covid) - Nasopharyngeal Swab     Status: None   Collection Time: 12/19/19  5:43 PM   Specimen: Nasopharyngeal Swab  Result Value Ref Range Status    SARS Coronavirus 2 by RT PCR NEGATIVE NEGATIVE Final    Comment: (NOTE) SARS-CoV-2 target nucleic acids are NOT DETECTED. The SARS-CoV-2 RNA is generally detectable in upper respiratoy specimens during the acute phase of infection. The lowest concentration of SARS-CoV-2 viral copies this assay can detect is 131 copies/mL. A negative result does not preclude SARS-Cov-2 infection and should not be used as the sole basis for treatment or other patient management decisions. A negative result may occur with  improper specimen collection/handling, submission of specimen other than nasopharyngeal swab, presence of viral mutation(s) within the areas targeted by this assay, and inadequate number of viral copies (<131 copies/mL). A negative result must be combined with clinical observations, patient history, and epidemiological information. The expected result is Negative. Fact Sheet for Patients:  https://www.moore.com/ Fact Sheet for Healthcare Providers:  https://www.young.biz/ This test is not yet ap proved or cleared by the [place] FDA and  has been authorized for detection and/or diagnosis of SARS-CoV-2 by FDA under an Emergency Use Authorization (EUA). This EUA will remain  in effect (meaning this test can be used) for the duration of the COVID-19 declaration under Section 564(b)(1) of the Act, 21 U.S.C. section 360bbb-3(b)(1), unless the authorization is terminated or revoked sooner.    Influenza A by PCR NEGATIVE NEGATIVE Final   Influenza B by PCR NEGATIVE NEGATIVE Final    Comment: (NOTE) The Xpert Xpress SARS-CoV-2/FLU/RSV assay is intended as an aid in  the diagnosis of influenza from Nasopharyngeal swab specimens and  should not be used as a sole basis for treatment. Nasal washings and  aspirates are unacceptable for Xpert Xpress SARS-CoV-2/FLU/RSV  testing. Fact Sheet for Patients: https://www.moore.com/ Fact  Sheet for Healthcare Providers: https://www.young.biz/ This test is not yet approved or cleared by the [place] FDA and  has been authorized for detection and/or diagnosis of SARS-CoV-2 by  FDA under an Emergency Use Authorization (EUA). This EUA will remain  in effect (  meaning this test can be used) for the duration of the  Covid-19 declaration under Section 564(b)(1) of the Act, 21  U.S.C. section 360bbb-3(b)(1), unless the authorization is  terminated or revoked. Performed at [facility], [address] [place], [place] [address]   MRSA PCR Screening     Status: None   Collection Time: 12/20/19 12:54 AM   Specimen: Nasal Mucosa; Nasopharyngeal  Result Value Ref Range Status   MRSA by PCR NEGATIVE NEGATIVE Final    Comment:        The GeneXpert MRSA Assay (FDA approved for NASAL specimens only), is one component of a comprehensive MRSA colonization surveillance program. It is not intended to diagnose MRSA infection nor to guide or monitor treatment for MRSA infections. Performed at [facility], [place] [address], [place], [place] [address]   Culture, blood (Routine X 2) w Reflex to ID Panel     Status: None (Preliminary result)   Collection Time: 12/20/19  1:07 PM   Specimen: BLOOD LEFT FOREARM  Result Value Ref Range Status   Specimen Description BLOOD LEFT FOREARM  Final   Special Requests   Final    BOTTLES DRAWN AEROBIC AND ANAEROBIC Blood Culture adequate volume Performed at [facility], [place] [address], [place], [place] [address]    Culture NO GROWTH 4 DAYS  Final   Report Status PENDING  Incomplete  Culture, blood (Routine X 2) w Reflex to ID Panel     Status: None (Preliminary result)   Collection Time: 12/20/19  1:16 PM   Specimen: BLOOD LEFT FOREARM  Result Value Ref Range Status   Specimen Description BLOOD LEFT FOREARM  Final   Special Requests   Final    BOTTLES DRAWN AEROBIC AND ANAEROBIC Blood Culture  adequate volume Performed at [facility], [place] [address] [place], [place] [address]    Culture NO GROWTH 4 DAYS  Final   Report Status PENDING  Incomplete  Aerobic Culture (superficial specimen)     Status: None   Collection Time: 12/22/19  7:20 PM   Specimen: Wound; Abscess  Result Value Ref Range Status   Specimen Description WOUND CERVICAL DISC SPACE  Final   Special Requests PATIENT ON FOLLOWING ROCEPHIN AND VANC  Final   Gram Stain   Final    RARE WBC PRESENT, PREDOMINANTLY PMN NO ORGANISMS SEEN    Culture   Final    NO GROWTH Performed at [facility], [place] [address], [place],  [address]    Report Status 12/25/2019 FINAL  Final     Janene Madeira, MSN, NP-C [facility] [email or web address] Pager: [phone] Office: [phone] [place]: [phone]

## 2019-12-29 LAB — CULTURE, BLOOD (ROUTINE X 2)
Culture: NO GROWTH
Culture: NO GROWTH
Special Requests: ADEQUATE
Special Requests: ADEQUATE

## 2019-12-29 MED ORDER — SODIUM CHLORIDE 0.9% FLUSH
10.0000 mL | Freq: Two times a day (BID) | INTRAVENOUS | Status: DC
  Administered 2019-12-29 – 2020-01-31 (×36): 10 mL

## 2019-12-29 MED ORDER — CHLORHEXIDINE GLUCONATE CLOTH 2 % EX PADS
6.0000 | MEDICATED_PAD | Freq: Every day | CUTANEOUS | Status: DC
  Administered 2019-12-29 – 2020-02-02 (×33): 6 via TOPICAL

## 2019-12-29 MED ORDER — SODIUM CHLORIDE 0.9% FLUSH
10.0000 mL | INTRAVENOUS | Status: DC | PRN
  Administered 2020-01-24 – 2020-01-29 (×3): 10 mL

## 2019-12-29 NOTE — Progress Notes (Signed)
Pharmacy Antibiotic Note Michael Lester is a 34 y.o. male admitted on 12/19/2019 with osteomyelitis of the cervical spine. Pharmacy has been consulted for vancomycin dosing.  Last vancomycin levels on 2/26 were therapeutic on 1250 mg IV every 12 hours (calculated AUC 507). Scr slightly improved from 0.9 to 0.7, afebrile. Plan for 8 week treatment - will get vancomycin levels later this week.    Plan: Continue vancomycin 1250 mg IV q12 hours. Obtain vancomycin peak and trough later this week. F/u renal function and continued clinical improvement.   Height: 6\' 1"  (185.4 cm) Weight: 157 lb 10.1 oz (71.5 kg) IBW/kg (Calculated) : 79.9  Temp (24hrs), Avg:98.2 F (36.8 C), Min:97.7 F (36.5 C), Max:98.9 F (37.2 C)  Recent Labs  Lab 12/23/19 0629 12/25/19 0204 12/25/19 0820 12/26/19 0541 12/27/19 0625 12/28/19 0521  WBC 4.9  --   --  6.7  --  6.6  CREATININE 0.98  --   --  0.95 0.70 0.74  VANCOTROUGH  --   --  13*  --   --   --   VANCOPEAK  --  28*  --   --   --   --     Estimated Creatinine Clearance: 130.3 mL/min (by C-G formula based on SCr of 0.74 mg/dL).    Allergies  Allergen Reactions  . Shellfish Allergy Anaphylaxis   02/27/20, PharmD, BCCCP Clinical Pharmacist  Phone: [phone]  Please check AMION for all [facility] phone numbers After 10:00 PM, call Main Pharmacy [phone] 12/29/2019 9:51 AM

## 2019-12-29 NOTE — Progress Notes (Signed)
Patient out of bed in chair, has PIV access at this time.  Patient not discharging.  Due to high number of PICC volume PICC nurses went to next patient on list.  Spoke with bedside RN, plan to place later today if possible.  She states that PIV access is working at present.

## 2019-12-29 NOTE — Progress Notes (Signed)
Peripherally Inserted Central Catheter/Midline Placement  The IV Nurse has discussed with the patient and/or persons authorized to consent for the patient, the purpose of this procedure and the potential benefits and risks involved with this procedure.  The benefits include less needle sticks, lab draws from the catheter, and the patient may be discharged home with the catheter. Risks include, but not limited to, infection, bleeding, blood clot (thrombus formation), and puncture of an artery; nerve damage and irregular heartbeat and possibility to perform a PICC exchange if needed/ordered by physician.  Alternatives to this procedure were also discussed.  Bard Power PICC patient education guide, fact sheet on infection prevention and patient information card has been provided to patient /or left at bedside.    PICC/Midline Placement Documentation  PICC Single Lumen 12/29/19 PICC Left Basilic 43 cm 0 cm (Active)  Indication for Insertion or Continuance of Line Prolonged intravenous therapies 12/29/19 2112  Exposed Catheter (cm) 0 cm 12/29/19 2112  Site Assessment Clean;Dry;Intact 12/29/19 2112  Line Status Flushed;Saline locked;Blood return noted 12/29/19 2112  Dressing Type Transparent 12/29/19 2112  Dressing Status Clean;Dry;Intact 12/29/19 2112  Dressing Intervention New dressing 12/29/19 2112  Dressing Change Due 01/05/20 12/29/19 2112       Beatrice Ziehm, Lajean Manes 12/29/2019, 9:13 PM

## 2019-12-29 NOTE — Progress Notes (Signed)
PROGRESS NOTE  Michael Lester JSE:[id number] DOB: 1986/03/24   PCP: Patient, [name]  Patient is from: home  DOA: 12/19/2019 LOS: 10  Brief Narrative / Interim history: 34 year old male with history of IVDU presented to [facility] after found unresponsive and complaining of headache, neck pain, right shoulder pain and paresthesia in his right arm fingers.  He was septic with tachycardia and leukocytosis.  CT head and cervical spine without acute finding but abnormal lucency at the adjacent endplates of C6-7.  MRI cervical spine showed discitis and osteomyelitis at C6-7 with prevertebral edema and phlegmon resulting in spinal stenosis and cord mass-effect without definite cord signal abnormality and drainable fluid collection.  Resuscitated with IV fluids and started on cefepime, vancomycin and Flagyl.  NS at [facility] consulted by EDP I recommended admission to hospitalist service [facility] hospital for further treatment.  Blood culture at [facility] coag negative methicillin-resistant staph in 1 out of 2 bottles.  ID consulted and felt this to be contaminant.  Repeat blood culture here NGTD.  Patient underwent ACDF by neurosurgery on 12/22/2019.  Remains on vancomycin and ceftriaxone per ID pending tissue cultures.  Assessment & Plan:   Sepsis due to discitis/osteomyelitis in patient with history of IVDU, present on admission v C6-7 discitis/osteomyelitis with RUE paresthesia and weakness -ACDF by Dr. Jordan Likes on 2/23.  -Initial blood culture with coag neg methicillin-resistant staph in 1 out of 2 bottles-felt to be contaminant -Repeat blood culture and tissue cultures from 12/20/2019 NGTD -Neurosurgery and ID following-appreciate care and guidance -On vancomycin and ceftriaxone and rifampin-plan for 6 to 8 weeks of IV antibiotics. Patient seems to be in remission for 4 weeks after detox at [facility] (not 4 months).  No drug-seeking behavior here. -Pain controlled on oral regimen.  ID on board.  PICC line placed.  Will need  to be in the hospital for IV antibiotics due to history of IVDU unless we have solid other plans.  Recurrent syncope: Due to drug use?  EKG with ST but no other significant finding.  Echo unremarkable. -PT/OT  IVDU-last heroin injection was 4 weeks ago.  Reportedly, he underwent detox at [facility] and sent home on Suboxone taper.  He has been clean since then.  No drug-seeking behavior this hospitalization.  Normocytic anemia: Likely ACD.  H&H stable -Continue monitoring  Constipation: Resolved -Continue Senokot-S and MiraLAX twice daily as needed.             DVT prophylaxis: Subcu Lovenox Code Status: Full code Family Communication: No family member present.  Patient alert, oriented and competent.  Plan of care discussed with patient.  Discharge barrier: IV antibiotics for osteomyelitis/discitis in patient with IVDU Patient is from: Home Final disposition: Home  Consultants: Neurosurgery, ID    Subjective: Seen and examined.  No complaints.  Objective: Vitals:   12/28/19 2012 12/29/19 0017 12/29/19 0419 12/29/19 0800  BP: 113/78 116/75 126/84 132/80  Pulse: 90 83 87 79  Resp: 19 16 19 20   Temp: 98.9 F (37.2 C) 98.1 F (36.7 C) 98.2 F (36.8 C) 97.7 F (36.5 C)  TempSrc: Oral Oral Oral Oral  SpO2: 99% 100% 99% 100%  Weight:      Height:        Intake/Output Summary (Last 24 hours) at 12/29/2019 1243 Last data filed at 12/29/2019 0900 Gross per 24 hour  Intake 960 ml  Output 400 ml  Net 560 ml   Filed Weights   12/20/19 0135  Weight: 71.5 kg  Examination:  General exam: Appears calm and comfortable, neck collar in place Respiratory system: Clear to auscultation. Respiratory effort normal. Cardiovascular system: S1 & S2 heard, RRR. No JVD, murmurs, rubs, gallops or clicks. No pedal edema. Gastrointestinal system: Abdomen is nondistended, soft and nontender. No organomegaly or masses felt. Normal bowel sounds heard. Central nervous system: Alert and  oriented. No focal neurological deficits. Extremities: Symmetric 5 x 5 power. Skin: No rashes, lesions or ulcers.  Psychiatry: Judgement and insight appear normal. Mood & affect appropriate.     Procedures:  2/23-ACDF by Dr. Jordan Likes   Microbiology summarized: 2/20-blood culture with coag negative methicillin-resistant staph in 1 out of 2 bottles 2/21- Blood cultures NGTD 2/21-MRSA PCR negative 2/23-tissue cultures NGTD  Sch Meds:  Scheduled Meds: . gabapentin  100 mg Oral TID  . nicotine  14 mg Transdermal Daily  . rifampin  300 mg Oral BID WC  . sodium chloride flush  3 mL Intravenous Q12H  . sodium chloride flush  3 mL Intravenous Q12H   Continuous Infusions: . sodium chloride 250 mL (12/22/19 2244)  . cefTRIAXone (ROCEPHIN)  IV 2 g (12/29/19 0124)  . lactated ringers 200 mL/hr at 12/22/19 2004  . vancomycin 1,250 mg (12/29/19 0955)   PRN Meds:.acetaminophen **OR** acetaminophen, cyclobenzaprine, HYDROcodone-acetaminophen, HYDROcodone-acetaminophen, menthol-cetylpyridinium **OR** phenol, morphine injection, ondansetron **OR** ondansetron (ZOFRAN) IV, polyethylene glycol, senna-docusate, sodium chloride flush  Antimicrobials: Anti-infectives (From admission, onward)   Start     Dose/Rate Route Frequency Ordered Stop   12/26/19 0800  rifampin (RIFADIN) capsule 300 mg     300 mg Oral 2 times daily with meals 12/25/19 1037     12/22/19 1938  bacitracin 50,000 Units in sodium chloride 0.9 % 500 mL irrigation  Status:  Discontinued       As needed 12/22/19 1939 12/22/19 2048   12/22/19 1821  ceFAZolin (ANCEF) 2-4 GM/100ML-% IVPB    Note to Pharmacy: Little Ishikawa   : cabinet override      12/22/19 1821 12/23/19 0629   12/22/19 1000  vancomycin (VANCOREADY) IVPB 1250 mg/250 mL     1,250 mg 166.7 mL/hr over 90 Minutes Intravenous Every 12 hours 12/21/19 2301     12/20/19 0600  vancomycin (VANCOREADY) IVPB 1750 mg/350 mL  Status:  Discontinued     1,750 mg 175 mL/hr over 120  Minutes Intravenous Every 12 hours 12/20/19 0042 12/21/19 2301   12/20/19 0030  cefTRIAXone (ROCEPHIN) 2 g in sodium chloride 0.9 % 100 mL IVPB     2 g 200 mL/hr over 30 Minutes Intravenous Every 24 hours 12/20/19 0027         I have personally reviewed the following labs and images: CBC: Recent Labs  Lab 12/23/19 0629 12/26/19 0541 12/28/19 0521  WBC 4.9 6.7 6.6  HGB 11.8* 11.7* 11.7*  HCT 35.5* 36.1* 36.2*  MCV 79.1* 81.1 80.1  PLT 292 238 269   BMP &GFR Recent Labs  Lab 12/23/19 0629 12/26/19 0541 12/27/19 0625 12/28/19 0521  NA 137 136  --  136  K 4.5 4.0  --  4.1  CL 106 103  --  102  CO2 21* 24  --  23  GLUCOSE 149* 96  --  100*  BUN 18 16  --  19  CREATININE 0.98 0.95 0.70 0.74  CALCIUM 9.1 9.4  --  9.5  MG  --  1.8  --   --   PHOS  --  3.6  --   --  Estimated Creatinine Clearance: 130.3 mL/min (by C-G formula based on SCr of 0.74 mg/dL). Liver & Pancreas: Recent Labs  Lab 12/26/19 0541  ALBUMIN 3.2*   No results for input(s): LIPASE, AMYLASE in the last 168 hours. No results for input(s): AMMONIA in the last 168 hours. Diabetic: No results for input(s): HGBA1C in the last 72 hours. No results for input(s): GLUCAP in the last 168 hours. Cardiac Enzymes: No results for input(s): CKTOTAL, CKMB, CKMBINDEX, TROPONINI in the last 168 hours. No results for input(s): PROBNP in the last 8760 hours. Coagulation Profile: No results for input(s): INR, PROTIME in the last 168 hours. Thyroid Function Tests: No results for input(s): TSH, T4TOTAL, FREET4, T3FREE, THYROIDAB in the last 72 hours. Lipid Profile: No results for input(s): CHOL, HDL, LDLCALC, TRIG, CHOLHDL, LDLDIRECT in the last 72 hours. Anemia Panel: No results for input(s): VITAMINB12, FOLATE, FERRITIN, TIBC, IRON, RETICCTPCT in the last 72 hours. Urine analysis: No results found for: COLORURINE, APPEARANCEUR, LABSPEC, PHURINE, GLUCOSEU, HGBUR, BILIRUBINUR, KETONESUR, PROTEINUR, UROBILINOGEN,  NITRITE, LEUKOCYTESUR Sepsis Labs: Invalid input(s): PROCALCITONIN, LACTICIDVEN  Microbiology: Recent Results (from the past 240 hour(s))  Culture, blood (routine x 2)     Status: None   Collection Time: 12/19/19  3:41 PM   Specimen: BLOOD  Result Value Ref Range Status   Specimen Description BLOOD  R FOREARM  Final   Special Requests   Final    BOTTLES DRAWN AEROBIC AND ANAEROBIC Blood Culture adequate volume   Culture   Final    NO GROWTH 5 DAYS Performed at [facility], [address] [place], [place] [address]    Report Status 12/24/2019 FINAL  Final  Culture, blood (routine x 2)     Status: Abnormal   Collection Time: 12/19/19  3:41 PM   Specimen: BLOOD  Result Value Ref Range Status   Specimen Description BLOOD  L FOREARM  Final   Special Requests   Final    BOTTLES DRAWN AEROBIC AND ANAEROBIC Blood Culture adequate volume   Culture  Setup Time   Final    GRAM POSITIVE COCCI IN BOTH AEROBIC AND ANAEROBIC BOTTLES CRITICAL RESULT CALLED TO, READ BACK BY AND VERIFIED WITH: JEENA PIGG AND V BRYAK AT 0719 ON 12/20/19 SNG    Culture (A)  Final    STAPHYLOCOCCUS SPECIES (COAGULASE NEGATIVE) THE SIGNIFICANCE OF ISOLATING THIS ORGANISM FROM A SINGLE SET OF BLOOD CULTURES WHEN MULTIPLE SETS ARE DRAWN IS UNCERTAIN. PLEASE NOTIFY THE MICROBIOLOGY DEPARTMENT WITHIN ONE WEEK IF SPECIATION AND SENSITIVITIES ARE REQUIRED.    Report Status 12/21/2019 FINAL  Final  Blood Culture ID Panel (Reflexed)     Status: Abnormal   Collection Time: 12/19/19  3:41 PM  Result Value Ref Range Status   Enterococcus species NOT DETECTED NOT DETECTED Final   Listeria monocytogenes NOT DETECTED NOT DETECTED Final   Staphylococcus species DETECTED (A) NOT DETECTED Final    Comment: Methicillin (oxacillin) resistant coagulase negative staphylococcus. Possible blood culture contaminant (unless isolated from more than one blood culture draw or clinical case suggests pathogenicity). No antibiotic  treatment is indicated for blood  culture contaminants. CRITICAL RESULT CALLED TO, READ BACK BY AND VERIFIED WITH: KAREN PATTON AT 0944 ON 12/20/19 SNG    Staphylococcus aureus (BCID) NOT DETECTED NOT DETECTED Final   Methicillin resistance DETECTED (A) NOT DETECTED Final    Comment: CRITICAL RESULT CALLED TO, READ BACK BY AND VERIFIED WITH: KAREN PATTON AT 0944 ON 12/20/19 SNG    Streptococcus species NOT DETECTED  NOT DETECTED Final   Streptococcus agalactiae NOT DETECTED NOT DETECTED Final   Streptococcus pneumoniae NOT DETECTED NOT DETECTED Final   Streptococcus pyogenes NOT DETECTED NOT DETECTED Final   Acinetobacter baumannii NOT DETECTED NOT DETECTED Final   Enterobacteriaceae species NOT DETECTED NOT DETECTED Final   Enterobacter cloacae complex NOT DETECTED NOT DETECTED Final   Escherichia coli NOT DETECTED NOT DETECTED Final   Klebsiella oxytoca NOT DETECTED NOT DETECTED Final   Klebsiella pneumoniae NOT DETECTED NOT DETECTED Final   Proteus species NOT DETECTED NOT DETECTED Final   Serratia marcescens NOT DETECTED NOT DETECTED Final   Haemophilus influenzae NOT DETECTED NOT DETECTED Final   Neisseria meningitidis NOT DETECTED NOT DETECTED Final   Pseudomonas aeruginosa NOT DETECTED NOT DETECTED Final   Candida albicans NOT DETECTED NOT DETECTED Final   Candida glabrata NOT DETECTED NOT DETECTED Final   Candida krusei NOT DETECTED NOT DETECTED Final   Candida parapsilosis NOT DETECTED NOT DETECTED Final   Candida tropicalis NOT DETECTED NOT DETECTED Final    Comment: Performed at [facility], [address] [place], [place] [address]  Respiratory Panel by RT PCR (Flu A&B, Covid) - Nasopharyngeal Swab     Status: None   Collection Time: 12/19/19  5:43 PM   Specimen: Nasopharyngeal Swab  Result Value Ref Range Status   SARS Coronavirus 2 by RT PCR NEGATIVE NEGATIVE Final    Comment: (NOTE) SARS-CoV-2 target nucleic acids are NOT DETECTED. The SARS-CoV-2 RNA is  generally detectable in upper respiratoy specimens during the acute phase of infection. The lowest concentration of SARS-CoV-2 viral copies this assay can detect is 131 copies/mL. A negative result does not preclude SARS-Cov-2 infection and should not be used as the sole basis for treatment or other patient management decisions. A negative result may occur with  improper specimen collection/handling, submission of specimen other than nasopharyngeal swab, presence of viral mutation(s) within the areas targeted by this assay, and inadequate number of viral copies (<131 copies/mL). A negative result must be combined with clinical observations, patient history, and epidemiological information. The expected result is Negative. Fact Sheet for Patients:  PinkCheek.be Fact Sheet for Healthcare Providers:  GravelBags.it This test is not yet ap proved or cleared by the [place] FDA and  has been authorized for detection and/or diagnosis of SARS-CoV-2 by FDA under an Emergency Use Authorization (EUA). This EUA will remain  in effect (meaning this test can be used) for the duration of the COVID-19 declaration under Section 564(b)(1) of the Act, 21 U.S.C. section 360bbb-3(b)(1), unless the authorization is terminated or revoked sooner.    Influenza A by PCR NEGATIVE NEGATIVE Final   Influenza B by PCR NEGATIVE NEGATIVE Final    Comment: (NOTE) The Xpert Xpress SARS-CoV-2/FLU/RSV assay is intended as an aid in  the diagnosis of influenza from Nasopharyngeal swab specimens and  should not be used as a sole basis for treatment. Nasal washings and  aspirates are unacceptable for Xpert Xpress SARS-CoV-2/FLU/RSV  testing. Fact Sheet for Patients: PinkCheek.be Fact Sheet for Healthcare Providers: GravelBags.it This test is not yet approved or cleared by the [place] FDA and    has been authorized for detection and/or diagnosis of SARS-CoV-2 by  FDA under an Emergency Use Authorization (EUA). This EUA will remain  in effect (meaning this test can be used) for the duration of the  Covid-19 declaration under Section 564(b)(1) of the Act, 21  U.S.C. section 360bbb-3(b)(1), unless the authorization is  terminated or revoked.  Performed at [facility], [address] [place], [place] [address]   MRSA PCR Screening     Status: None   Collection Time: 12/20/19 12:54 AM   Specimen: Nasal Mucosa; Nasopharyngeal  Result Value Ref Range Status   MRSA by PCR NEGATIVE NEGATIVE Final    Comment:        The GeneXpert MRSA Assay (FDA approved for NASAL specimens only), is one component of a comprehensive MRSA colonization surveillance program. It is not intended to diagnose MRSA infection nor to guide or monitor treatment for MRSA infections. Performed at [facility], [address] [place], [place] [address]   Culture, blood (Routine X 2) w Reflex to ID Panel     Status: None (Preliminary result)   Collection Time: 12/20/19  1:07 PM   Specimen: BLOOD LEFT FOREARM  Result Value Ref Range Status   Specimen Description BLOOD LEFT FOREARM  Final   Special Requests   Final    BOTTLES DRAWN AEROBIC AND ANAEROBIC Blood Culture adequate volume Performed at [facility], [address], [place], [place] [address]    Culture NO GROWTH 4 DAYS  Final   Report Status PENDING  Incomplete  Culture, blood (Routine X 2) w Reflex to ID Panel     Status: None (Preliminary result)   Collection Time: 12/20/19  1:16 PM   Specimen: BLOOD LEFT FOREARM  Result Value Ref Range Status   Specimen Description BLOOD LEFT FOREARM  Final   Special Requests   Final    BOTTLES DRAWN AEROBIC AND ANAEROBIC Blood Culture adequate volume Performed at [facility], [address], [place], [place] [address]    Culture NO GROWTH 4 DAYS  Final   Report Status  PENDING  Incomplete  Aerobic Culture (superficial specimen)     Status: None   Collection Time: 12/22/19  7:20 PM   Specimen: Wound; Abscess  Result Value Ref Range Status   Specimen Description WOUND CERVICAL DISC SPACE  Final   Special Requests PATIENT ON FOLLOWING ROCEPHIN AND VANC  Final   Gram Stain   Final    RARE WBC PRESENT, PREDOMINANTLY PMN NO ORGANISMS SEEN    Culture   Final    NO GROWTH Performed at [facility], [address], [place], [place] [address]    Report Status 12/25/2019 FINAL  Final    Radiology Studies: [place] EKG SITE RITE  Result Date: 12/28/2019 If Site Rite image not attached, placement could not be confirmed due to current cardiac rhythm.   Total time spent 24 minutes Hughie Closs, MD [facility]  If 7PM-7AM, please contact night-coverage [email or web address] 12/29/2019, 12:43 PM

## 2019-12-29 NOTE — TOC Progression Note (Signed)
Transition of Care (TOC) - Progression Note  Donn Pierini RN,BSN Transitions of Care Unit 4NP (non trauma) - RN Case Manager [phone]   Patient Details  Name: AHMEER TUMAN MRN: [id number] Date of Birth: 1986-08-26  Transition of Care [facility]) CM/SW Contact  Zenda Alpers, Lenn Sink, RN Phone Number: 12/29/2019, 2:48 PM  Clinical Narrative:    Noted plan per ID is for 6-8 wks of IV abx, pt with hx of IVDU with notes referencing in chart recent use anywhere from day of admission to 70mo ago. Spoke with [facility] agency who states that they would be hesitant to follow pt at home for home IV abx with recent hx of IV drug use and PICC line in place also spoke with Pam with [facility] who also expressed concern with home PICC and IV abx with known recent IVDU. - noted MD note on 2/28 to state plan for pt to remain in-house for IV abx as per discussion with Dr. Remigio Eisenmenger with ID as this is safest plan at this time.  TOC to follow    Expected Discharge Plan: Home w Home Health Services Barriers to Discharge: Continued Medical Work up, Inadequate or no insurance  Expected Discharge Plan and Services Expected Discharge Plan: Home w Home Health Services                                               Social Determinants of Health (SDOH) Interventions    Readmission Risk Interventions No flowsheet data found.

## 2019-12-30 DIAGNOSIS — R6520 Severe sepsis without septic shock: Secondary | ICD-10-CM

## 2019-12-30 DIAGNOSIS — A419 Sepsis, unspecified organism: Secondary | ICD-10-CM | POA: Diagnosis present

## 2019-12-30 NOTE — Progress Notes (Signed)
Occupational Therapy Treatment Patient Details Name: Michael Lester MRN: [id number] DOB: 06/15/1986 Today's Date: 12/30/2019    History of present illness Michael Lester is a 34 y.o. male with medical history significant for IV heroin abuse, now presenting to the emergency department after he was found unresponsive and complaining of headache and neck pain.  Patient reports 1 to 2 weeks of pain near the base of his neck that radiates to the right shoulder and has been associated with numbness involving the right second and third fingers. Pt now s/p C5-7 ACDF due to osteomyeltis discitis with epidural abscess.   OT comments  Pt making great progress toward OT goals. Focused session on RUE HEP with yellow theraband and written HEP. Pt was able to progress with shoulder abduction at chest height, which he reports was difficult last OT session. Added shoulder extension with theraband in addition to tricep extension for added difficulty. Also educated pt on RUE tricep stretching for pain management with muscular soreness. Reviewed theraputty HEP as well. Personal HEP updated in room. Pt shows proficiency and carry over with HEP and is completing exercises in room multiple times a day, reduced frequency to 2x per week for improved progress. Will continue to follow.   Follow Up Recommendations  Outpatient OT    Equipment Recommendations  None recommended by OT    Recommendations for Other Services      Precautions / Restrictions Precautions Precautions: Cervical Precaution Booklet Issued: Yes (comment) Precaution Comments: reviewed in context of BADL Required Braces or Orthoses: Cervical Brace Cervical Brace: Soft collar;Other (comment) Restrictions Weight Bearing Restrictions: No Other Position/Activity Restrictions: Cleared by Dr. Jordan Likes for gentle UE exercises with theraband.       Mobility Bed Mobility Overal bed mobility: Independent                Transfers Overall transfer  level: Independent                    Balance Overall balance assessment: No apparent balance deficits (not formally assessed)                                         ADL either performed or assessed with clinical judgement   ADL Overall ADL's : Needs assistance/impaired                                       General ADL Comments: session focus on RUE HEP with level 1 theraband and theraputty ( written HEP provided)     Vision   Vision Assessment?: No apparent visual deficits   Perception     Praxis      Cognition Arousal/Alertness: Awake/alert Behavior During Therapy: WFL for tasks assessed/performed Overall Cognitive Status: Within Functional Limits for tasks assessed                                          Exercises Shoulder Exercises Shoulder Extension: Strengthening;Theraband;10 reps;Right;Seated Theraband Level (Shoulder Extension): Level 1 (Yellow) Shoulder ABduction: Strengthening;Both;10 reps;Seated;Theraband Elbow Flexion: Right;10 reps;Seated;Theraband Theraband Level (Elbow Flexion): Level 1 (Yellow) Elbow Extension: Strengthening;Right;10 reps;Seated;Theraband Theraband Level (Elbow Extension): Level 1 (Yellow) Other Exercises Other Exercises: scapular elevation/ depression, protraction/retraction and  circumduction seated in chairx 10 reps Other Exercises: tricep stretch to manage pain for before and after exercise program Other Exercises: reviewed theraputty for intrinsics   Shoulder Instructions       General Comments      Pertinent Vitals/ Pain       Pain Assessment: Faces Faces Pain Scale: Hurts little more Pain Location: RUE with elbow flexion Pain Descriptors / Indicators: Aching;Sore;Radiating;Discomfort;Grimacing Pain Intervention(s): Monitored during session  Home Living                                          Prior Functioning/Environment               Frequency  Min 2X/week        Progress Toward Goals  OT Goals(current goals can now be found in the care plan section)  Progress towards OT goals: Progressing toward goals  Acute Rehab OT Goals Patient Stated Goal: regian RUE strength OT Goal Formulation: With patient Time For Goal Achievement: 01/06/20 Potential to Achieve Goals: Good  Plan Frequency needs to be updated    Co-evaluation                 AM-PAC OT "6 Clicks" Daily Activity     Outcome Measure   Help from another person eating meals?: None Help from another person taking care of personal grooming?: None Help from another person toileting, which includes using toliet, bedpan, or urinal?: None Help from another person bathing (including washing, rinsing, drying)?: A Little Help from another person to put on and taking off regular upper body clothing?: A Little Help from another person to put on and taking off regular lower body clothing?: None 6 Click Score: 22    End of Session Equipment Utilized During Treatment: Cervical collar  OT Visit Diagnosis: Muscle weakness (generalized) (M62.81);Other symptoms and signs involving the nervous system (R29.898);Pain Pain - Right/Left: Right Pain - part of body: Arm   Activity Tolerance Patient tolerated treatment well   Patient Left in chair;with call bell/phone within reach   Nurse Communication Mobility status        Time: 8413-2440 OT Time Calculation (min): 35 min  Charges: OT General Charges $OT Visit: 1 Visit OT Treatments $Self Care/Home Management : 23-37 mins  Zenovia Jarred, MSOT, OTR/L Acute Rehabilitation Services [facility] Office Number: [phone] Pager: [phone]  Zenovia Jarred 12/30/2019, 12:26 PM

## 2019-12-30 NOTE — Progress Notes (Signed)
[facility] PROGRESS NOTE    Progress Note  Michael Lester  RFF:[id number] DOB: 19-May-1986 DOA: 12/19/2019 PCP: Patient, [name]     Brief Narrative:   Michael Lester is an 34 y.o. male past medical history of IVDU presents to [facility] after found unresponsive complaining of headache, neck pain right shoulder pain, he was found to be septic with tachycardia and leukocytosis CT of the C-spine show some lucency in C6-C7 MRI of the spine showed discitis and osteomyelitis of C6-C7 and paravertebral edema resulting in phlegmon with with spinal stenosis and cord mass-effect.   Assessment/Plan:   Sepsis due to discitis/osteomyelitis of C6-7: Anterior cervical discectomy with interbody fusion by Dr. Dutch Quint on 12/22/2019. Blood cultures show coag negative staph 1 out of 2 bottles likely contaminant. Repeated blood cultures on 12/20/2019 have remained negative till date. Appreciate neurosurgery and ID guidance. Patient will continue IV vancomycin Rocephin and rifampin for 6 to 8 weeks.  Recurrent syncope: Echo was unremarkable EKG no significant findings question due to drug use in the setting of sepsis.  Injection of illicit drug within last 12 months: He relates he went to detox about 4 weeks prior to admission  Normocytic anemia: Likely due to anemia of chronic disease. We will need follow-up as an outpatient.  Constipation: Resolved with MiraLAX.     DVT prophylaxis: loveno Family Communication:none Disposition Plan/Barrier to D/C: The previous physician spoke with ID Dr. Farris Has and they decided to keep the patient in house for duration of antibiotics.  Code Status:     Code Status Orders  (From admission, onward)         Start     Ordered   12/20/19 0026  Full code  Continuous     12/20/19 0027        Code Status History    Date Active Date Inactive Code Status Order ID Comments User Context   11/17/2014 0231 11/17/2014 2116 Full Code [id number]  Manus Rudd, MD  Inpatient   Advance Care Planning Activity        IV Access:    Peripheral IV   Procedures and diagnostic studies:   [place] EKG SITE RITE  Result Date: 12/28/2019 If Site Rite image not attached, placement could not be confirmed due to current cardiac rhythm.    Medical Consultants:    None.  Anti-Infectives:   IV vancomycin and Rocephin  Subjective:    [name] he just woke up he is sleepy able to carry on a conversation no complaints he relate his pain is controlled.  Objective:    Vitals:   12/29/19 0800 12/29/19 2121 12/30/19 0022 12/30/19 0503  BP: 132/80 112/75 114/71 128/77  Pulse: 79 79 94 82  Resp: 20 18 18 16   Temp: 97.7 F (36.5 C) 98.2 F (36.8 C) 98.1 F (36.7 C) 98.1 F (36.7 C)  TempSrc: Oral Oral Oral Oral  SpO2: 100% 97% 97% 99%  Weight:      Height:       SpO2: 99 % O2 Flow Rate (L/min): 5 L/min   Intake/Output Summary (Last 24 hours) at 12/30/2019 0747 Last data filed at 12/29/2019 1700 Gross per 24 hour  Intake 720 ml  Output 475 ml  Net 245 ml   Filed Weights   12/20/19 0135  Weight: 71.5 kg    Exam: General exam: In no acute distress. Respiratory system: Good air movement and clear to auscultation. Cardiovascular system: S1 & S2 heard, RRR. No JVD.  Gastrointestinal system: Abdomen is nondistended, soft and nontender.  Extremities: No pedal edema. Skin: No rashes, lesions or ulcers Psychiatry: Judgement and insight appear normal. Mood & affect appropriate. Data Reviewed:    Labs: Basic Metabolic Panel: Recent Labs  Lab 12/26/19 0541 12/27/19 0625 12/28/19 0521  NA 136  --  136  K 4.0  --  4.1  CL 103  --  102  CO2 24  --  23  GLUCOSE 96  --  100*  BUN 16  --  19  CREATININE 0.95 0.70 0.74  CALCIUM 9.4  --  9.5  MG 1.8  --   --   PHOS 3.6  --   --    GFR Estimated Creatinine Clearance: 130.3 mL/min (by C-G formula based on SCr of 0.74 mg/dL). Liver Function Tests: Recent Labs  Lab 12/26/19 0541    ALBUMIN 3.2*   No results for input(s): LIPASE, AMYLASE in the last 168 hours. No results for input(s): AMMONIA in the last 168 hours. Coagulation profile No results for input(s): INR, PROTIME in the last 168 hours. COVID-19 Labs  No results for input(s): DDIMER, FERRITIN, LDH, CRP in the last 72 hours.  Lab Results  Component Value Date   SARSCOV2NAA NEGATIVE 12/19/2019   SARSCOV2NAA NEGATIVE 12/06/2019    CBC: Recent Labs  Lab 12/26/19 0541 12/28/19 0521  WBC 6.7 6.6  HGB 11.7* 11.7*  HCT 36.1* 36.2*  MCV 81.1 80.1  PLT 238 269   Cardiac Enzymes: No results for input(s): CKTOTAL, CKMB, CKMBINDEX, TROPONINI in the last 168 hours. BNP (last 3 results) No results for input(s): PROBNP in the last 8760 hours. CBG: No results for input(s): GLUCAP in the last 168 hours. D-Dimer: No results for input(s): DDIMER in the last 72 hours. Hgb A1c: No results for input(s): HGBA1C in the last 72 hours. Lipid Profile: No results for input(s): CHOL, HDL, LDLCALC, TRIG, CHOLHDL, LDLDIRECT in the last 72 hours. Thyroid function studies: No results for input(s): TSH, T4TOTAL, T3FREE, THYROIDAB in the last 72 hours.  Invalid input(s): FREET3 Anemia work up: No results for input(s): VITAMINB12, FOLATE, FERRITIN, TIBC, IRON, RETICCTPCT in the last 72 hours. Sepsis Labs: Recent Labs  Lab 12/26/19 0541 12/28/19 0521  WBC 6.7 6.6   Microbiology Recent Results (from the past 240 hour(s))  Culture, blood (Routine X 2) w Reflex to ID Panel     Status: None   Collection Time: 12/20/19  1:07 PM   Specimen: BLOOD LEFT FOREARM  Result Value Ref Range Status   Specimen Description BLOOD LEFT FOREARM  Final   Special Requests   Final    BOTTLES DRAWN AEROBIC AND ANAEROBIC Blood Culture adequate volume Performed at [facility], [address], [place], [place] [address]    Culture NO GROWTH 9 DAYS  Final   Report Status 12/29/2019 FINAL  Final  Culture, blood (Routine X 2) w  Reflex to ID Panel     Status: None   Collection Time: 12/20/19  1:16 PM   Specimen: BLOOD LEFT FOREARM  Result Value Ref Range Status   Specimen Description BLOOD LEFT FOREARM  Final   Special Requests   Final    BOTTLES DRAWN AEROBIC AND ANAEROBIC Blood Culture adequate volume Performed at [facility], [address] [place], [place] [address]    Culture NO GROWTH 9 DAYS  Final   Report Status 12/29/2019 FINAL  Final  Aerobic Culture (superficial specimen)     Status: None  Collection Time: 12/22/19  7:20 PM   Specimen: Wound; Abscess  Result Value Ref Range Status   Specimen Description WOUND CERVICAL DISC SPACE  Final   Special Requests PATIENT ON FOLLOWING ROCEPHIN AND VANC  Final   Gram Stain   Final    RARE WBC PRESENT, PREDOMINANTLY PMN NO ORGANISMS SEEN    Culture   Final    NO GROWTH Performed at [facility], [place] [address] [place], [place] [address]    Report Status 12/25/2019 FINAL  Final     Medications:   . Chlorhexidine Gluconate Cloth  6 each Topical Daily  . gabapentin  100 mg Oral TID  . nicotine  14 mg Transdermal Daily  . rifampin  300 mg Oral BID WC  . sodium chloride flush  10-40 mL Intracatheter Q12H   Continuous Infusions: . cefTRIAXone (ROCEPHIN)  IV 2 g (12/30/19 0024)  . lactated ringers 200 mL/hr at 12/22/19 2004  . vancomycin 1,250 mg (12/29/19 2138)      LOS: 11 days   Charlynne Cousins  [facility]  12/30/2019, 7:47 AM

## 2019-12-31 LAB — BASIC METABOLIC PANEL
Anion gap: 11 (ref 5–15)
BUN: 20 mg/dL (ref 6–20)
CO2: 24 mmol/L (ref 22–32)
Calcium: 9.3 mg/dL (ref 8.9–10.3)
Chloride: 102 mmol/L (ref 98–111)
Creatinine, Ser: 0.87 mg/dL (ref 0.61–1.24)
GFR calc Af Amer: >60 mL/min (ref >60)
GFR calc non Af Amer: >60 mL/min (ref >60)
Glucose, Bld: 112 mg/dL — ABNORMAL HIGH (ref 70–99)
Potassium: 3.7 mmol/L (ref 3.5–5.1)
Sodium: 137 mmol/L (ref 135–145)

## 2019-12-31 LAB — VANCOMYCIN, PEAK: Vancomycin Pk: 72 ug/mL (ref 30–40)

## 2019-12-31 LAB — VANCOMYCIN, TROUGH: Vancomycin Tr: 11 ug/mL — ABNORMAL LOW (ref 15–20)

## 2019-12-31 MED ORDER — VANCOMYCIN HCL IN DEXTROSE 1-5 GM/200ML-% IV SOLN
1000.0000 mg | Freq: Two times a day (BID) | INTRAVENOUS | Status: DC
  Administered 2019-12-31 – 2020-01-11 (×22): 1000 mg via INTRAVENOUS
  Filled 2019-12-31 (×22): qty 200

## 2019-12-31 NOTE — Progress Notes (Signed)
Pharmacy Antibiotic Note Michael Lester is a 34 y.o. male admitted on 12/19/2019 with osteomyelitis of the cervical spine. Pharmacy has been consulted for vancomycin dosing. SCr today is stable at 0.87. Vancomycin levels were collected last night and this AM. Vancomycin peak collected at 00:30 was 72. It appears that this peak was drawn correctly but seems abnormally high. Trough drawn this morning was appropriate at 11. Estimated AUC with these values was > 1000.   Given that patient has a steady SCr and is young , will lower dose slightly and re-check levels at steady state as  I am skeptical about the accuracy of the peak.      Plan: Reduce vancomycin to 1 gm q 12 hours  F/u renal function and continued clinical improvement.  Will repeat levels at steady state.   Height: 6\' 1"  (185.4 cm) Weight: 157 lb 10.1 oz (71.5 kg) IBW/kg (Calculated) : 79.9  Temp (24hrs), Avg:98 F (36.7 C), Min:97.8 F (36.6 C), Max:98.4 F (36.9 C)  Recent Labs  Lab 12/25/19 0204 12/25/19 0820 12/26/19 0541 12/27/19 0625 12/28/19 0521 12/31/19 0030 12/31/19 0903  WBC  --   --  6.7  --  6.6  --   --   CREATININE  --   --  0.95 0.70 0.74  --  0.87  VANCOTROUGH  --  13*  --   --   --   --  11*  VANCOPEAK 28*  --   --   --   --  72*  --     Estimated Creatinine Clearance: 119.9 mL/min (by C-G formula based on SCr of 0.87 mg/dL).    Allergies  Allergen Reactions  . Shellfish Allergy Anaphylaxis   03/01/20, PharmD, BCPS, BCIDP Infectious Diseases Clinical Pharmacist Phone: [phone] Please check AMION for all [facility] phone numbers After 10:00 PM, call Main Pharmacy [phone] 12/31/2019 12:00 PM

## 2019-12-31 NOTE — Progress Notes (Signed)
[facility] PROGRESS NOTE    Progress Note  Michael Lester  JME:[id number] DOB: 06-28-1986 DOA: 12/19/2019 PCP: Patient, [name]     Brief Narrative:   Michael Lester is an 34 y.o. male past medical history of IVDU presents to [facility] after found unresponsive complaining of headache, neck pain right shoulder pain, he was found to be septic with tachycardia and leukocytosis CT of the C-spine show some lucency in C6-C7 MRI of the spine showed discitis and osteomyelitis of C6-C7 and paravertebral edema resulting in phlegmon with with spinal stenosis and cord mass-effect.   Assessment/Plan:   Sepsis due to discitis/osteomyelitis of C6-7: Anterior cervical discectomy with interbody fusion by Dr. Trenton Gammon on 12/22/2019. Blood cultures show coag negative staph 1 out of 2 bottles likely contaminant. Repeated blood cultures on 12/20/2019 have remained negative till date. Appreciate neurosurgery and ID guidance. Patient will continue IV vancomycin Rocephin and rifampin a minimum of 6 weeksStarted 01/18/2020  Recurrent syncope: Echo was unremarkable EKG no significant findings question due to drug use in the setting of sepsis.  Injection of illicit drug within last 12 months: He relates he went to detox about 4 weeks prior to admission  Normocytic anemia: Likely due to anemia of chronic disease. We will need follow-up as an outpatient.  Constipation: Resolved with MiraLAX.     DVT prophylaxis: loveno Family Communication:none Disposition Plan/Barrier to D/C: The previous physician spoke with ID Dr. Alfonso Ramus and they decided to keep the patient in house for duration of antibiotics.  Code Status:     Code Status Orders  (From admission, onward)         Start     Ordered   12/20/19 0026  Full code  Continuous     12/20/19 0027        Code Status History    Date Active Date Inactive Code Status Order ID Comments User Context   11/17/2014 0231 11/17/2014 2116 Full Code [id number]   Donnie Mesa, MD Inpatient   Advance Care Planning Activity        IV Access:    Peripheral IV   Procedures and diagnostic studies:   No results found.   Medical Consultants:    None.  Anti-Infectives:   IV vancomycin and Rocephin  Subjective:    Michael Lester he has no new complaints this morning he relates he is comfortable, a little bit hungry waiting for breakfast.  Objective:    Vitals:   12/30/19 1623 12/30/19 2000 12/31/19 0000 12/31/19 0400  BP: 96/61 135/84 116/70 120/71  Pulse: 88 95 92 100  Resp: 16 18 18 16   Temp: 98.4 F (36.9 C) 98.1 F (36.7 C) 97.9 F (36.6 C) 98 F (36.7 C)  TempSrc: Oral Oral Oral Oral  SpO2: 100% 98% 98% 98%  Weight:      Height:       SpO2: 98 % O2 Flow Rate (L/min): 5 L/min   Intake/Output Summary (Last 24 hours) at 12/31/2019 0718 Last data filed at 12/30/2019 1335 Gross per 24 hour  Intake 222 ml  Output --  Net 222 ml   Filed Weights   12/20/19 0135  Weight: 71.5 kg    Exam: General exam: In no acute distress. Respiratory system: Good air movement and clear to auscultation. Cardiovascular system: S1 & S2 heard, RRR. No JVD. Gastrointestinal system: Abdomen is nondistended, soft and nontender.  Extremities: No pedal edema. Skin: No rashes, lesions or ulcers Psychiatry: Judgement and insight appear  normal. Mood & affect appropriate. Data Reviewed:    Labs: Basic Metabolic Panel: Recent Labs  Lab 12/26/19 0541 12/27/19 0625 12/28/19 0521  NA 136  --  136  K 4.0  --  4.1  CL 103  --  102  CO2 24  --  23  GLUCOSE 96  --  100*  BUN 16  --  19  CREATININE 0.95 0.70 0.74  CALCIUM 9.4  --  9.5  MG 1.8  --   --   PHOS 3.6  --   --    GFR Estimated Creatinine Clearance: 130.3 mL/min (by C-G formula based on SCr of 0.74 mg/dL). Liver Function Tests: Recent Labs  Lab 12/26/19 0541  ALBUMIN 3.2*   No results for input(s): LIPASE, AMYLASE in the last 168 hours. No results for input(s):  AMMONIA in the last 168 hours. Coagulation profile No results for input(s): INR, PROTIME in the last 168 hours. COVID-19 Labs  No results for input(s): DDIMER, FERRITIN, LDH, CRP in the last 72 hours.  Lab Results  Component Value Date   SARSCOV2NAA NEGATIVE 12/19/2019   SARSCOV2NAA NEGATIVE 12/06/2019    CBC: Recent Labs  Lab 12/26/19 0541 12/28/19 0521  WBC 6.7 6.6  HGB 11.7* 11.7*  HCT 36.1* 36.2*  MCV 81.1 80.1  PLT 238 269   Cardiac Enzymes: No results for input(s): CKTOTAL, CKMB, CKMBINDEX, TROPONINI in the last 168 hours. BNP (last 3 results) No results for input(s): PROBNP in the last 8760 hours. CBG: No results for input(s): GLUCAP in the last 168 hours. D-Dimer: No results for input(s): DDIMER in the last 72 hours. Hgb A1c: No results for input(s): HGBA1C in the last 72 hours. Lipid Profile: No results for input(s): CHOL, HDL, LDLCALC, TRIG, CHOLHDL, LDLDIRECT in the last 72 hours. Thyroid function studies: No results for input(s): TSH, T4TOTAL, T3FREE, THYROIDAB in the last 72 hours.  Invalid input(s): FREET3 Anemia work up: No results for input(s): VITAMINB12, FOLATE, FERRITIN, TIBC, IRON, RETICCTPCT in the last 72 hours. Sepsis Labs: Recent Labs  Lab 12/26/19 0541 12/28/19 0521  WBC 6.7 6.6   Microbiology Recent Results (from the past 240 hour(s))  Aerobic Culture (superficial specimen)     Status: None   Collection Time: 12/22/19  7:20 PM   Specimen: Wound; Abscess  Result Value Ref Range Status   Specimen Description WOUND CERVICAL DISC SPACE  Final   Special Requests PATIENT ON FOLLOWING ROCEPHIN AND VANC  Final   Gram Stain   Final    RARE WBC PRESENT, PREDOMINANTLY PMN NO ORGANISMS SEEN    Culture   Final    NO GROWTH Performed at [facility], [address], [place], [place] [address]    Report Status 12/25/2019 FINAL  Final     Medications:   . Chlorhexidine Gluconate Cloth  6 each Topical Daily  . gabapentin  100 mg  Oral TID  . nicotine  14 mg Transdermal Daily  . rifampin  300 mg Oral BID WC  . sodium chloride flush  10-40 mL Intracatheter Q12H   Continuous Infusions: . cefTRIAXone (ROCEPHIN)  IV 2 g (12/31/19 0052)  . lactated ringers 200 mL/hr at 12/22/19 2004      LOS: 12 days   Marinda Elk  [facility]  12/31/2019, 7:18 AM

## 2020-01-01 LAB — HCV RNA QUANT
HCV Quantitative Log: 2.519 log10 IU/mL (ref >1.70)
HCV Quantitative: 330 IU/mL (ref >50)

## 2020-01-01 NOTE — Progress Notes (Signed)
Occupational Therapy Treatment Patient Details Name: Michael Lester MRN: [id number] DOB: 02-Dec-1985 Today's Date: 01/01/2020    History of present illness Michael Lester is a 34 y.o. male with medical history significant for IV heroin abuse, now presenting to the emergency department after he was found unresponsive and complaining of headache and neck pain.  Patient reports 1 to 2 weeks of pain near the base of his neck that radiates to the right shoulder and has been associated with numbness involving the right second and third fingers. Pt now s/p C5-7 ACDF due to osteomyeltis discitis with epidural abscess.   OT comments  Pt. Seen for skilled OT treatment.  Focus of session completion of HEP for UE. Pt. Able to demonstrate good techinque of all indicated exercises.  Reports completion 2-3x daily.  Reviewed how to modify resistance as needed with increasing and decreasing the length of the theraband.  Pt. Could benefit from level II theraband.  Will provide as indicated.    Follow Up Recommendations  Outpatient OT    Equipment Recommendations  None recommended by OT    Recommendations for Other Services      Precautions / Restrictions Precautions Precautions: Cervical Precaution Comments: reviewed in context of BADL Required Braces or Orthoses: Cervical Brace Cervical Brace: Soft collar;Other (comment) Restrictions Other Position/Activity Restrictions: Cleared by Dr. Annette Stable for gentle UE exercises with theraband.       Mobility Bed Mobility                  Transfers                      Balance                                           ADL either performed or assessed with clinical judgement   ADL                                               Vision       Perception     Praxis      Cognition Arousal/Alertness: Awake/alert Behavior During Therapy: WFL for tasks assessed/performed Overall Cognitive Status: Within  Functional Limits for tasks assessed                                          Exercises General Exercises - Upper Extremity Elbow Flexion: AROM;Right;10 reps;Seated;Theraband Theraband Level (Elbow Flexion): Level 1 (Yellow) Elbow Extension: AROM;Right;10 reps;Seated;Theraband Theraband Level (Elbow Extension): Level 1 (Yellow) Other Exercises Other Exercises: tricep stretch to manage pain for before and after exercise program Other Exercises: pt. performed HEP previously provided with good technique. reviewed how to increase resistance by shortening the length of the band.   Shoulder Instructions       General Comments      Pertinent Vitals/ Pain       Pain Assessment: 0-10 Pain Score: 5  Pain Location: R tricep area Pain Descriptors / Indicators: Aching;Sore;Radiating;Discomfort;Grimacing Pain Intervention(s): Limited activity within patient's tolerance;Monitored during session  Home Living  Prior Functioning/Environment              Frequency  Min 2X/week        Progress Toward Goals  OT Goals(current goals can now be found in the care plan section)  Progress towards OT goals: Progressing toward goals     Plan      Co-evaluation                 AM-PAC OT "6 Clicks" Daily Activity     Outcome Measure   Help from another person eating meals?: None Help from another person taking care of personal grooming?: None Help from another person toileting, which includes using toliet, bedpan, or urinal?: None Help from another person bathing (including washing, rinsing, drying)?: A Little Help from another person to put on and taking off regular upper body clothing?: A Little Help from another person to put on and taking off regular lower body clothing?: None 6 Click Score: 22    End of Session Equipment Utilized During Treatment: Cervical collar  OT Visit Diagnosis: Muscle  weakness (generalized) (M62.81);Other symptoms and signs involving the nervous system (R29.898);Pain Pain - Right/Left: Right Pain - part of body: Arm   Activity Tolerance Patient tolerated treatment well   Patient Left in chair;with call bell/phone within reach   Nurse Communication          Time: 4128-7867 OT Time Calculation (min): 33 min  Charges: OT General Charges $OT Visit: 1 Visit OT Treatments $Therapeutic Exercise: 23-37 mins  Boneta Lucks, COTA/L Acute Rehabilitation [phone]   Robet Leu 01/01/2020, 12:55 PM

## 2020-01-01 NOTE — Progress Notes (Signed)
[facility] PROGRESS NOTE    Progress Note  Michael Lester  IWO:[id number] DOB: [birth year] DOA: 12/19/2019 PCP: Patient, [name]     Brief Narrative:   Michael Lester is an 34 y.o. male past medical history of IVDU presents to [facility] after found unresponsive complaining of headache, neck pain right shoulder pain, he was found to be septic with tachycardia and leukocytosis CT of the C-spine show some lucency in C6-C7 MRI of the spine showed discitis and osteomyelitis of C6-C7 and paravertebral edema resulting in phlegmon with with spinal stenosis and cord mass-effect.   Assessment/Plan:   Sepsis due to discitis/osteomyelitis of C6-7: Anterior cervical discectomy with interbody fusion by Dr. Dutch Quint on 12/22/2019. Blood cultures show coag negative staph 1 out of 2 bottles likely contaminant. Repeated blood cultures on 12/20/2019 have remained negative till date. Appreciate neurosurgery and ID guidance. Patient will continue IV vancomycin Rocephin and rifampin a minimum of 6 weeksStarted 01/18/2020  Recurrent syncope: Echo was unremarkable EKG no significant findings question due to drug use in the setting of sepsis.  Injection of illicit drug within last 12 months: He relates he went to detox about 4 weeks prior to admission  Normocytic anemia: Likely due to anemia of chronic disease. We will need follow-up as an outpatient.  Constipation: Resolved with MiraLAX.     DVT prophylaxis: loveno Family Communication:none Disposition Plan/Barrier to D/C: The previous physician spoke with ID Dr. Farris Has and they decided to keep the patient in house for duration of antibiotics.  Code Status:     Code Status Orders  (From admission, onward)         Start     Ordered   12/20/19 0026  Full code  Continuous     12/20/19 0027        Code Status History    Date Active Date Inactive Code Status Order ID Comments User Context   11/17/2014 0231 11/17/2014 2116 Full Code [id number]   Manus Rudd, MD Inpatient   Advance Care Planning Activity        IV Access:    Peripheral IV   Procedures and diagnostic studies:   No results found.   Medical Consultants:    None.  Anti-Infectives:   IV vancomycin and Rocephin  Subjective:    Michael Lester (he feels great tolerating his diet.  Objective:    Vitals:   12/31/19 1607 12/31/19 1907 12/31/19 2300 01/01/20 0343  BP: 116/82 128/75 124/75 (!) 118/91  Pulse: 95 (!) 105 80 97  Resp: 16 18 18 18   Temp: 98.2 F (36.8 C) 97.6 F (36.4 C) 97.9 F (36.6 C) (!) 97.5 F (36.4 C)  TempSrc: Oral Oral Oral Oral  SpO2: 98% 98% 98% 99%  Weight:      Height:       SpO2: 99 % O2 Flow Rate (L/min): 5 L/min   Intake/Output Summary (Last 24 hours) at 01/01/2020 0757 Last data filed at 01/01/2020 0400 Gross per 24 hour  Intake 900 ml  Output --  Net 900 ml   Filed Weights   12/20/19 0135  Weight: 71.5 kg    Exam: General exam: In no acute distress. Respiratory system: Good air movement and clear to auscultation. Cardiovascular system: S1 & S2 heard, RRR. No JVD. Gastrointestinal system: Abdomen is nondistended, soft and nontender.  Extremities: No pedal edema. Skin: No rashes, lesions or ulcers Psychiatry: Judgement and insight appear normal. Mood & affect appropriate. Data Reviewed:  Labs: Basic Metabolic Panel: Recent Labs  Lab 12/26/19 0541 12/26/19 0541 12/27/19 0625 12/28/19 0521 12/31/19 0903  NA 136  --   --  136 137  K 4.0   < >  --  4.1 3.7  CL 103  --   --  102 102  CO2 24  --   --  23 24  GLUCOSE 96  --   --  100* 112*  BUN 16  --   --  19 20  CREATININE 0.95  --  0.70 0.74 0.87  CALCIUM 9.4  --   --  9.5 9.3  MG 1.8  --   --   --   --   PHOS 3.6  --   --   --   --    < > = values in this interval not displayed.   GFR Estimated Creatinine Clearance: 119.9 mL/min (by C-G formula based on SCr of 0.87 mg/dL). Liver Function Tests: Recent Labs  Lab 12/26/19 0541   ALBUMIN 3.2*   No results for input(s): LIPASE, AMYLASE in the last 168 hours. No results for input(s): AMMONIA in the last 168 hours. Coagulation profile No results for input(s): INR, PROTIME in the last 168 hours. COVID-19 Labs  No results for input(s): DDIMER, FERRITIN, LDH, CRP in the last 72 hours.  Lab Results  Component Value Date   SARSCOV2NAA NEGATIVE 12/19/2019   [place] NEGATIVE 12/06/2019    CBC: Recent Labs  Lab 12/26/19 0541 12/28/19 0521  WBC 6.7 6.6  HGB 11.7* 11.7*  HCT 36.1* 36.2*  MCV 81.1 80.1  PLT 238 269   Cardiac Enzymes: No results for input(s): CKTOTAL, CKMB, CKMBINDEX, TROPONINI in the last 168 hours. BNP (last 3 results) No results for input(s): PROBNP in the last 8760 hours. CBG: No results for input(s): GLUCAP in the last 168 hours. D-Dimer: No results for input(s): DDIMER in the last 72 hours. Hgb A1c: No results for input(s): HGBA1C in the last 72 hours. Lipid Profile: No results for input(s): CHOL, HDL, LDLCALC, TRIG, CHOLHDL, LDLDIRECT in the last 72 hours. Thyroid function studies: No results for input(s): TSH, T4TOTAL, T3FREE, THYROIDAB in the last 72 hours.  Invalid input(s): FREET3 Anemia work up: No results for input(s): VITAMINB12, FOLATE, FERRITIN, TIBC, IRON, RETICCTPCT in the last 72 hours. Sepsis Labs: Recent Labs  Lab 12/26/19 0541 12/28/19 0521  WBC 6.7 6.6   Microbiology Recent Results (from the past 240 hour(s))  Aerobic Culture (superficial specimen)     Status: None   Collection Time: 12/22/19  7:20 PM   Specimen: Wound; Abscess  Result Value Ref Range Status   Specimen Description WOUND CERVICAL DISC SPACE  Final   Special Requests PATIENT ON FOLLOWING ROCEPHIN AND VANC  Final   Gram Stain   Final    RARE WBC PRESENT, PREDOMINANTLY PMN NO ORGANISMS SEEN    Culture   Final    NO GROWTH Performed at [facility], [place] [address], [place], [place] [address]    Report Status 12/25/2019 FINAL   Final     Medications:   . Chlorhexidine Gluconate Cloth  6 each Topical Daily  . gabapentin  100 mg Oral TID  . nicotine  14 mg Transdermal Daily  . rifampin  300 mg Oral BID WC  . sodium chloride flush  10-40 mL Intracatheter Q12H   Continuous Infusions: . cefTRIAXone (ROCEPHIN)  IV 2 g (12/31/19 2348)  . lactated ringers 200 mL/hr at 12/22/19 2004  .  vancomycin 1,000 mg (01/01/20 0252)      LOS: 13 days   Marinda Elk  [facility]  01/01/2020, 7:57 AM

## 2020-01-02 DIAGNOSIS — Z79899 Other long term (current) drug therapy: Secondary | ICD-10-CM

## 2020-01-02 DIAGNOSIS — Z419 Encounter for procedure for purposes other than remedying health state, unspecified: Secondary | ICD-10-CM

## 2020-01-02 LAB — VANCOMYCIN, PEAK: Vancomycin Pk: 32 ug/mL (ref 30–40)

## 2020-01-02 NOTE — Progress Notes (Signed)
PROGRESS NOTE    Michael Lester  HQI:[id number] DOB: 1986/08/13 DOA: 12/19/2019 PCP: Patient, [name]     Brief Narrative:  Michael Lester is an 34 y.o. WM PMHx IVDU   Presents to [facility] after found unresponsive complaining of headache, neck pain right shoulder pain, he was found to be septic with tachycardia and leukocytosis CT of the C-spine show some lucency in C6-C7 MRI of the spine showed discitis and osteomyelitis of C6-C7 and paravertebral edema resulting in phlegmon with with spinal stenosis and cord mass-effect.    Subjective: A/O x4, negative S OB, negative DOE, mild cervical pain.   Assessment & Plan:   Principal Problem:   Osteomyelitis of C6-7 Active Problems:   Injection of illicit drug within last 12 months   Syncope   Abscess in epidural space of cervical spine   Opioid dependence, daily use (HCC)   Positive hepatitis C antibody test   Severe sepsis (HCC)   Sepsis due to discitis/osteomyelitis of C6-7: Anterior cervical discectomy with interbody fusion by Dr. Dutch Quint on 12/22/2019. Blood cultures show coag negative staph 1 out of 2 bottles likely contaminant. Repeated blood cultures on 12/20/2019 have remained negative till date. Patient will continue IV vancomycin Rocephin and rifampin a minimum of 6 weeks Started 01/18/2020  Recurrent syncope: Echo was unremarkable EKG no significant findings  question due to drug use in the setting of sepsis.  Injection of illicit drug within last 12 months: He relates he went to detox about 4 weeks prior to admission  Normocytic anemia: Likely due to anemia of chronic disease. We will need follow-up as an outpatient.  Constipation: Resolved with MiraLAX.   DVT prophylaxis: SCD Code Status: Full Family Communication:  Disposition Plan:    Consultants:  Neurosurgery  ID   Procedures/Significant Events:    I have personally reviewed and interpreted all radiology studies and my findings are as  above.  VENTILATOR SETTINGS:    Cultures   Antimicrobials: Anti-infectives (From admission, onward)   Start     Dose/Rate Stop   12/31/19 1400  vancomycin (VANCOCIN) IVPB 1000 mg/200 mL premix     1,000 mg 200 mL/hr over 60 Minutes     12/26/19 0800  rifampin (RIFADIN) capsule 300 mg     300 mg     12/22/19 1938  bacitracin 50,000 Units in sodium chloride 0.9 % 500 mL irrigation  Status:  Discontinued      12/22/19 2048   12/22/19 1821  ceFAZolin (ANCEF) 2-4 GM/100ML-% IVPB    Note to Pharmacy: Little Ishikawa   : cabinet override    12/23/19 0629   12/22/19 1000  vancomycin (VANCOREADY) IVPB 1250 mg/250 mL  Status:  Discontinued     1,250 mg 166.7 mL/hr over 90 Minutes 12/31/19 0339   12/20/19 0600  vancomycin (VANCOREADY) IVPB 1750 mg/350 mL  Status:  Discontinued     1,750 mg 175 mL/hr over 120 Minutes 12/21/19 2301   12/20/19 0030  cefTRIAXone (ROCEPHIN) 2 g in sodium chloride 0.9 % 100 mL IVPB     2 g 200 mL/hr over 30 Minutes         Devices    LINES / TUBES:      Continuous Infusions: . cefTRIAXone (ROCEPHIN)  IV 2 g (01/01/20 2355)  . lactated ringers 200 mL/hr at 12/22/19 2004  . vancomycin 1,000 mg (01/02/20 1356)     Objective: Vitals:   01/01/20 2029 01/01/20 2338 01/02/20 0738 01/02/20 1138  BP:  131/84 117/82 109/75 115/76  Pulse: 94 84 78 87  Resp: 18 18 18 16   Temp: 98.2 F (36.8 C) 98 F (36.7 C) 98.2 F (36.8 C) 98 F (36.7 C)  TempSrc: Oral Oral Oral Oral  SpO2: 99% 98% 98% 99%  Weight:      Height:        Intake/Output Summary (Last 24 hours) at 01/02/2020 1532 Last data filed at 01/02/2020 0900 Gross per 24 hour  Intake 477 ml  Output --  Net 477 ml   Filed Weights   12/20/19 0135  Weight: 71.5 kg    Examination:  General: A/O x4 no acute respiratory distress Eyes: negative scleral hemorrhage, negative anisocoria, negative icterus ENT: Negative Runny nose, negative gingival bleeding, Neck:  Negative scars, masses,  torticollis, lymphadenopathy, JVD, c-collar in place  Lungs: Clear to auscultation bilaterally without wheezes or crackles Cardiovascular: Regular rate and rhythm without murmur gallop or rub normal S1 and S2 Abdomen: negative abdominal pain, nondistended, positive soft, bowel sounds, no rebound, no ascites, no appreciable mass Extremities: No significant cyanosis, clubbing, or edema bilateral lower extremities Skin: Negative rashes, lesions, ulcers Psychiatric:  Negative depression, negative anxiety, negative fatigue, negative mania  Central nervous system:  Cranial nerves II through XII intact, tongue/uvula midline, all extremities muscle strength 5/5, sensation intact throughout, negative dysarthria, negative expressive aphasia, negative receptive aphasia.  .     Data Reviewed: Care during the described time interval was provided by me .  I have reviewed this patient's available data, including medical history, events of note, physical examination, and all test results as part of my evaluation.  CBC: Recent Labs  Lab 12/28/19 0521  WBC 6.6  HGB 11.7*  HCT 36.2*  MCV 80.1  PLT 269   Basic Metabolic Panel: Recent Labs  Lab 12/27/19 0625 12/28/19 0521 12/31/19 0903  NA  --  136 137  K  --  4.1 3.7  CL  --  102 102  CO2  --  23 24  GLUCOSE  --  100* 112*  BUN  --  19 20  CREATININE 0.70 0.74 0.87  CALCIUM  --  9.5 9.3   GFR: Estimated Creatinine Clearance: 119.9 mL/min (by C-G formula based on SCr of 0.87 mg/dL). Liver Function Tests: No results for input(s): AST, ALT, ALKPHOS, BILITOT, PROT, ALBUMIN in the last 168 hours. No results for input(s): LIPASE, AMYLASE in the last 168 hours. No results for input(s): AMMONIA in the last 168 hours. Coagulation Profile: No results for input(s): INR, PROTIME in the last 168 hours. Cardiac Enzymes: No results for input(s): CKTOTAL, CKMB, CKMBINDEX, TROPONINI in the last 168 hours. BNP (last 3 results) No results for input(s):  PROBNP in the last 8760 hours. HbA1C: No results for input(s): HGBA1C in the last 72 hours. CBG: No results for input(s): GLUCAP in the last 168 hours. Lipid Profile: No results for input(s): CHOL, HDL, LDLCALC, TRIG, CHOLHDL, LDLDIRECT in the last 72 hours. Thyroid Function Tests: No results for input(s): TSH, T4TOTAL, FREET4, T3FREE, THYROIDAB in the last 72 hours. Anemia Panel: No results for input(s): VITAMINB12, FOLATE, FERRITIN, TIBC, IRON, RETICCTPCT in the last 72 hours. Sepsis Labs: No results for input(s): PROCALCITON, LATICACIDVEN in the last 168 hours.  No results found for this or any previous visit (from the past 240 hour(s)).       Radiology Studies: No results found.      Scheduled Meds: . Chlorhexidine Gluconate Cloth  6 each Topical Daily  .  gabapentin  100 mg Oral TID  . nicotine  14 mg Transdermal Daily  . rifampin  300 mg Oral BID WC  . sodium chloride flush  10-40 mL Intracatheter Q12H   Continuous Infusions: . cefTRIAXone (ROCEPHIN)  IV 2 g (01/01/20 2355)  . lactated ringers 200 mL/hr at 12/22/19 2004  . vancomycin 1,000 mg (01/02/20 1356)     LOS: 14 days    Time spent:40 min    Craigory Toste, Geraldo Docker, MD [facility] Pager [phone]  If 7PM-7AM, please contact night-coverage [email or web address] Password [facility] 01/02/2020, 3:32 PM

## 2020-01-03 LAB — BASIC METABOLIC PANEL
Anion gap: 10 (ref 5–15)
BUN: 24 mg/dL — ABNORMAL HIGH (ref 6–20)
CO2: 27 mmol/L (ref 22–32)
Calcium: 9 mg/dL (ref 8.9–10.3)
Chloride: 103 mmol/L (ref 98–111)
Creatinine, Ser: 1 mg/dL (ref 0.61–1.24)
GFR calc Af Amer: >60 mL/min (ref >60)
GFR calc non Af Amer: >60 mL/min (ref >60)
Glucose, Bld: 101 mg/dL — ABNORMAL HIGH (ref 70–99)
Potassium: 3.7 mmol/L (ref 3.5–5.1)
Sodium: 140 mmol/L (ref 135–145)

## 2020-01-03 LAB — VANCOMYCIN, TROUGH: Vancomycin Tr: 9 ug/mL — ABNORMAL LOW (ref 15–20)

## 2020-01-03 NOTE — Progress Notes (Signed)
Pharmacy Antibiotic Note  Michael Lester is a 34 y.o. male admitted on 12/19/2019 with osteomyelitis of the cervical spine.  Pharmacy has been consulted for vancomycin dosing.  Vanc peak 32, trough 9 >> AUC 496, at goal.  Plan: This patient's current antibiotics will be continued without adjustments.  Temp (24hrs), Avg:98.3 F (36.8 C), Min:98 F (36.7 C), Max:98.4 F (36.9 C)  Recent Labs  Lab 12/27/19 0625 12/28/19 0521 12/31/19 0030 12/31/19 0903 01/02/20 1631 01/03/20 0130  WBC  --  6.6  --   --   --   --   CREATININE 0.70 0.74  --  0.87  --  1.00  VANCOTROUGH  --   --   --  11*  --  9*  VANCOPEAK  --   --  72*  --  32  --     Estimated Creatinine Clearance: 104.3 mL/min (by C-G formula based on SCr of 1 mg/dL).    Allergies  Allergen Reactions  . Shellfish Allergy Anaphylaxis    Thank you for allowing pharmacy to be a part of this patient's care.  Vernard Gambles, PharmD, BCPS  01/03/2020 2:51 AM

## 2020-01-03 NOTE — Progress Notes (Signed)
PROGRESS NOTE    Michael Lester  HMC:[id number] DOB: June 04, 1986 DOA: 12/19/2019 PCP: Patient, [name]     Brief Narrative:  Michael Lester is an 34 y.o. WM PMHx IVDU   Presents to [facility] after found unresponsive complaining of headache, neck pain right shoulder pain, he was found to be septic with tachycardia and leukocytosis CT of the C-spine show some lucency in C6-C7 MRI of the spine showed discitis and osteomyelitis of C6-C7 and paravertebral edema resulting in phlegmon with with spinal stenosis and cord mass-effect.    Subjective: A/O x4, negative S OB, negative DOE, mild cervical pain.   Assessment & Plan:   Principal Problem:   Osteomyelitis of C6-7 Active Problems:   Injection of illicit drug within last 12 months   Syncope   Abscess in epidural space of cervical spine   Opioid dependence, daily use (HCC)   Positive hepatitis C antibody test   Severe sepsis (HCC)   Sepsis due to discitis/osteomyelitis of C6-7: Anterior cervical discectomy with interbody fusion by Dr. Dutch Quint on 12/22/2019. Blood cultures show coag negative staph 1 out of 2 bottles likely contaminant. Repeated blood cultures on 12/20/2019 have remained negative till date. Patient will continue IV vancomycin Rocephin and rifampin a minimum of 6 weeks Started 01/18/2020  Recurrent syncope: Echo was unremarkable EKG no significant findings  question due to drug use in the setting of sepsis.  Injection of illicit drug within last 12 months: He relates he went to detox about 4 weeks prior to admission  Normocytic anemia: Likely due to anemia of chronic disease. We will need follow-up as an outpatient.  Constipation: Resolved with MiraLAX.   DVT prophylaxis: SCD Code Status: Full Family Communication:  Disposition Plan: Patient will have to remain in hospital or SNF until completion of all IV antibiotics.  IV drug addict.   Consultants:  Neurosurgery  ID   Procedures/Significant Events:     I have personally reviewed and interpreted all radiology studies and my findings are as above.  VENTILATOR SETTINGS:    Cultures   Antimicrobials: Anti-infectives (From admission, onward)   Start     Dose/Rate Stop   12/31/19 1400  vancomycin (VANCOCIN) IVPB 1000 mg/200 mL premix     1,000 mg 200 mL/hr over 60 Minutes 02/13/20 2359   12/26/19 0800  rifampin (RIFADIN) capsule 300 mg     300 mg 02/13/20 2359   12/22/19 1938  bacitracin 50,000 Units in sodium chloride 0.9 % 500 mL irrigation  Status:  Discontinued      12/22/19 2048   12/22/19 1821  ceFAZolin (ANCEF) 2-4 GM/100ML-% IVPB    Note to Pharmacy: Little Ishikawa   : cabinet override    12/23/19 0629   12/22/19 1000  vancomycin (VANCOREADY) IVPB 1250 mg/250 mL  Status:  Discontinued     1,250 mg 166.7 mL/hr over 90 Minutes 12/31/19 0339   12/20/19 0600  vancomycin (VANCOREADY) IVPB 1750 mg/350 mL  Status:  Discontinued     1,750 mg 175 mL/hr over 120 Minutes 12/21/19 2301   12/20/19 0030  cefTRIAXone (ROCEPHIN) 2 g in sodium chloride 0.9 % 100 mL IVPB     2 g 200 mL/hr over 30 Minutes 02/13/20 2359       Devices    LINES / TUBES:      Continuous Infusions: . cefTRIAXone (ROCEPHIN)  IV 2 g (01/03/20 0133)  . lactated ringers 200 mL/hr at 12/22/19 2004  . vancomycin 1,000 mg (01/03/20 0300)  Objective: Vitals:   01/02/20 1635 01/02/20 2136 01/03/20 0746 01/03/20 1127  BP: 122/82 (!) 122/94 111/77 122/85  Pulse: 86 99 82 98  Resp: 14 16 14 16   Temp: 98.4 F (36.9 C) 98.4 F (36.9 C) 97.9 F (36.6 C) 98.2 F (36.8 C)  TempSrc: Oral Oral Oral Oral  SpO2: 100% 100% 98% 100%  Weight:      Height:        Intake/Output Summary (Last 24 hours) at 01/03/2020 1145 Last data filed at 01/03/2020 0749 Gross per 24 hour  Intake 360 ml  Output 0 ml  Net 360 ml   Filed Weights   12/20/19 0135  Weight: 71.5 kg    Examination:  General: A/O x4 no acute respiratory distress Eyes: negative scleral  hemorrhage, negative anisocoria, negative icterus ENT: Negative Runny nose, negative gingival bleeding, Neck:  Negative scars, masses, torticollis, lymphadenopathy, JVD, c-collar in place  Lungs: Clear to auscultation bilaterally without wheezes or crackles Cardiovascular: Regular rate and rhythm without murmur gallop or rub normal S1 and S2 Abdomen: negative abdominal pain, nondistended, positive soft, bowel sounds, no rebound, no ascites, no appreciable mass Extremities: No significant cyanosis, clubbing, or edema bilateral lower extremities Skin: Negative rashes, lesions, ulcers Psychiatric:  Negative depression, negative anxiety, negative fatigue, negative mania  Central nervous system:  Cranial nerves II through XII intact, tongue/uvula midline, all extremities muscle strength 5/5, sensation intact throughout, negative dysarthria, negative expressive aphasia, negative receptive aphasia.  .     Data Reviewed: Care during the described time interval was provided by me .  I have reviewed this patient's available data, including medical history, events of note, physical examination, and all test results as part of my evaluation.  CBC: Recent Labs  Lab 12/28/19 0521  WBC 6.6  HGB 11.7*  HCT 36.2*  MCV 80.1  PLT 601   Basic Metabolic Panel: Recent Labs  Lab 12/28/19 0521 12/31/19 0903 01/03/20 0130  NA 136 137 140  K 4.1 3.7 3.7  CL 102 102 103  CO2 23 24 27   GLUCOSE 100* 112* 101*  BUN 19 20 24*  CREATININE 0.74 0.87 1.00  CALCIUM 9.5 9.3 9.0   GFR: Estimated Creatinine Clearance: 104.3 mL/min (by C-G formula based on SCr of 1 mg/dL). Liver Function Tests: No results for input(s): AST, ALT, ALKPHOS, BILITOT, PROT, ALBUMIN in the last 168 hours. No results for input(s): LIPASE, AMYLASE in the last 168 hours. No results for input(s): AMMONIA in the last 168 hours. Coagulation Profile: No results for input(s): INR, PROTIME in the last 168 hours. Cardiac Enzymes: No  results for input(s): CKTOTAL, CKMB, CKMBINDEX, TROPONINI in the last 168 hours. BNP (last 3 results) No results for input(s): PROBNP in the last 8760 hours. HbA1C: No results for input(s): HGBA1C in the last 72 hours. CBG: No results for input(s): GLUCAP in the last 168 hours. Lipid Profile: No results for input(s): CHOL, HDL, LDLCALC, TRIG, CHOLHDL, LDLDIRECT in the last 72 hours. Thyroid Function Tests: No results for input(s): TSH, T4TOTAL, FREET4, T3FREE, THYROIDAB in the last 72 hours. Anemia Panel: No results for input(s): VITAMINB12, FOLATE, FERRITIN, TIBC, IRON, RETICCTPCT in the last 72 hours. Sepsis Labs: No results for input(s): PROCALCITON, LATICACIDVEN in the last 168 hours.  No results found for this or any previous visit (from the past 240 hour(s)).       Radiology Studies: No results found.      Scheduled Meds: . Chlorhexidine Gluconate Cloth  6 each Topical Daily  .  gabapentin  100 mg Oral TID  . nicotine  14 mg Transdermal Daily  . rifampin  300 mg Oral BID WC  . sodium chloride flush  10-40 mL Intracatheter Q12H   Continuous Infusions: . cefTRIAXone (ROCEPHIN)  IV 2 g (01/03/20 0133)  . lactated ringers 200 mL/hr at 12/22/19 2004  . vancomycin 1,000 mg (01/03/20 0300)     LOS: 15 days    Time spent:40 min    [place], Roselind Messier, MD [facility] Pager [phone]  If 7PM-7AM, please contact night-coverage [email or web address] Password [facility] 01/03/2020, 11:45 AM

## 2020-01-04 NOTE — Progress Notes (Signed)
PROGRESS NOTE    Michael Lester  ZOX:[id number] DOB: [birth year] DOA: 12/19/2019 PCP: Patient, [name]     Brief Narrative:  Michael Lester is an 34 y.o. WM PMHx IVDU   Presents to [facility] after found unresponsive complaining of headache, neck pain right shoulder pain, he was found to be septic with tachycardia and leukocytosis CT of the C-spine show some lucency in C6-C7 MRI of the spine showed discitis and osteomyelitis of C6-C7 and paravertebral edema resulting in phlegmon with with spinal stenosis and cord mass-effect.    Subjective: 3/8 S OB, negative DOE.  Ambulating around room   Assessment & Plan:   Principal Problem:   Osteomyelitis of C6-7 Active Problems:   Injection of illicit drug within last 12 months   Syncope   Abscess in epidural space of cervical spine   Opioid dependence, daily use (HCC)   Positive hepatitis C antibody test   Severe sepsis (HCC)   Sepsis due to discitis/osteomyelitis of C6-7: -Anterior cervical discectomy with interbody fusion by Dr. Dutch Quint on 12/22/2019. -Blood cultures show coag negative staph 1 out of 2 bottles likely contaminant. -Repeated blood cultures on 12/20/2019 have remained negative till date. -Patient will continue IV vancomycin Rocephin and rifampin a minimum of 6 weeks Started 01/18/2020  Recurrent syncope: -Echo was unremarkable EKG no significant findings question due to drug use in the setting of sepsis.  Injection of illicit drug within last 12 months: -He relates he went to detox about 4 weeks prior to admission -On admission no urine tox screen obtained  Normocytic anemia: -Likely due to anemia of chronic disease. -We will need follow-up as an outpatient.  Constipation: -Resolved with MiraLAX.   DVT prophylaxis: SCD Code Status: Full Family Communication:  Disposition Plan: Patient will have to remain in hospital or SNF until completion of all IV antibiotics.  IV drug addict.   Consultants:  Neurosurgery   ID   Procedures/Significant Events:    I have personally reviewed and interpreted all radiology studies and my findings are as above.  VENTILATOR SETTINGS:    Cultures   Antimicrobials: Anti-infectives (From admission, onward)   Start     Dose/Rate Stop   12/31/19 1400  vancomycin (VANCOCIN) IVPB 1000 mg/200 mL premix     1,000 mg 200 mL/hr over 60 Minutes 02/13/20 2359   12/26/19 0800  rifampin (RIFADIN) capsule 300 mg     300 mg 02/13/20 2359   12/22/19 1938  bacitracin 50,000 Units in sodium chloride 0.9 % 500 mL irrigation  Status:  Discontinued      12/22/19 2048   12/22/19 1821  ceFAZolin (ANCEF) 2-4 GM/100ML-% IVPB    Note to Pharmacy: [place]   : cabinet override    12/23/19 0629   12/22/19 1000  vancomycin (VANCOREADY) IVPB 1250 mg/250 mL  Status:  Discontinued     1,250 mg 166.7 mL/hr over 90 Minutes 12/31/19 0339   12/20/19 0600  vancomycin (VANCOREADY) IVPB 1750 mg/350 mL  Status:  Discontinued     1,750 mg 175 mL/hr over 120 Minutes 12/21/19 2301   12/20/19 0030  cefTRIAXone (ROCEPHIN) 2 g in sodium chloride 0.9 % 100 mL IVPB     2 g 200 mL/hr over 30 Minutes 02/13/20 2359       Devices    LINES / TUBES:      Continuous Infusions: . cefTRIAXone (ROCEPHIN)  IV 2 g (01/03/20 2333)  . lactated ringers 200 mL/hr at 12/22/19 2004  .  vancomycin 1,000 mg (01/04/20 1403)     Objective: Vitals:   01/03/20 2008 01/04/20 0440 01/04/20 0827 01/04/20 1214  BP: 123/83 105/67 118/87 122/82  Pulse: 96 (!) 49 78 (!) 105  Resp: 16 16 20 20   Temp: 98.3 F (36.8 C) 97.8 F (36.6 C) 97.9 F (36.6 C) 98 F (36.7 C)  TempSrc: Oral Oral Oral   SpO2: 99% 99% 96% 99%  Weight:      Height:        Intake/Output Summary (Last 24 hours) at 01/04/2020 1533 Last data filed at 01/03/2020 1853 Gross per 24 hour  Intake 360 ml  Output --  Net 360 ml   Filed Weights   12/20/19 0135  Weight: 71.5 kg   Physical Exam:  General: A/O x4, no acute  respiratory distress Eyes: negative scleral hemorrhage, negative anisocoria, negative icterus ENT: Negative Runny nose, negative gingival bleeding, Neck:  Negative scars, masses, torticollis, lymphadenopathy, JVD, right lower neck surgical dressing in place clean negative sign of infection Lungs: Clear to auscultation bilaterally without wheezes or crackles Cardiovascular: Regular rate and rhythm without murmur gallop or rub normal S1 and S2 Abdomen: negative abdominal pain, nondistended, positive soft, bowel sounds, no rebound, no ascites, no appreciable mass Extremities: No significant cyanosis, clubbing, or edema bilateral lower extremities Skin: Negative rashes, lesions, ulcers Psychiatric:  Negative depression, negative anxiety, negative fatigue, negative mania  Central nervous system:  Cranial nerves II through XII intact, tongue/uvula midline, all extremities muscle strength 5/5, sensation intact throughout, negative dysarthria, negative expressive aphasia, negative receptive aphasia. .     Data Reviewed: Care during the described time interval was provided by me .  I have reviewed this patient's available data, including medical history, events of note, physical examination, and all test results as part of my evaluation.  CBC: No results for input(s): WBC, NEUTROABS, HGB, HCT, MCV, PLT in the last 168 hours. Basic Metabolic Panel: Recent Labs  Lab 12/31/19 0903 01/03/20 0130  NA 137 140  K 3.7 3.7  CL 102 103  CO2 24 27  GLUCOSE 112* 101*  BUN 20 24*  CREATININE 0.87 1.00  CALCIUM 9.3 9.0   GFR: Estimated Creatinine Clearance: 104.3 mL/min (by C-G formula based on SCr of 1 mg/dL). Liver Function Tests: No results for input(s): AST, ALT, ALKPHOS, BILITOT, PROT, ALBUMIN in the last 168 hours. No results for input(s): LIPASE, AMYLASE in the last 168 hours. No results for input(s): AMMONIA in the last 168 hours. Coagulation Profile: No results for input(s): INR, PROTIME in  the last 168 hours. Cardiac Enzymes: No results for input(s): CKTOTAL, CKMB, CKMBINDEX, TROPONINI in the last 168 hours. BNP (last 3 results) No results for input(s): PROBNP in the last 8760 hours. HbA1C: No results for input(s): HGBA1C in the last 72 hours. CBG: No results for input(s): GLUCAP in the last 168 hours. Lipid Profile: No results for input(s): CHOL, HDL, LDLCALC, TRIG, CHOLHDL, LDLDIRECT in the last 72 hours. Thyroid Function Tests: No results for input(s): TSH, T4TOTAL, FREET4, T3FREE, THYROIDAB in the last 72 hours. Anemia Panel: No results for input(s): VITAMINB12, FOLATE, FERRITIN, TIBC, IRON, RETICCTPCT in the last 72 hours. Sepsis Labs: No results for input(s): PROCALCITON, LATICACIDVEN in the last 168 hours.  No results found for this or any previous visit (from the past 240 hour(s)).       Radiology Studies: No results found.      Scheduled Meds: . Chlorhexidine Gluconate Cloth  6 each Topical Daily  .  gabapentin  100 mg Oral TID  . nicotine  14 mg Transdermal Daily  . rifampin  300 mg Oral BID WC  . sodium chloride flush  10-40 mL Intracatheter Q12H   Continuous Infusions: . cefTRIAXone (ROCEPHIN)  IV 2 g (01/03/20 2333)  . lactated ringers 200 mL/hr at 12/22/19 2004  . vancomycin 1,000 mg (01/04/20 1403)     LOS: 16 days    Time spent:40 min    Zalma Channing, Roselind Messier, MD [facility] Pager [phone]  If 7PM-7AM, please contact night-coverage [email or web address] Password [facility] 01/04/2020, 3:33 PM

## 2020-01-04 NOTE — Progress Notes (Addendum)
[facility]  Date of Admission:  12/19/2019     Total days of antibiotics 17         ASSESSMENT:  Michael Lester is postop day 12 from anterior decompression with fusion secondary to subacute osteomyelitis involving C6-C7 complicated by epidural abscess at C7 nerve root.  Continues to feel better with each progressing day.  Has remained afebrile without leukocytosis and is increasing activity.  No evidence of nephrotoxicity with vancomycin and within therapeutic ranges. Plan remains for 6 weeks of IV vancomycin, ceftriaxone, and oral rifampin.  Using surgical date of 2/23 as day 1 with source control and date for antibiotics will be 02/02/2020. Disposition remains pending.   PLAN:  1. Continue current dose of vancomycin, ceftriaxone and rifampin through 02/02/20.  2. Monitor renal function for nephrotoxicity while on vancomycin. 3. Disposition remains pending.   Principal Problem:   Osteomyelitis of C6-7 Active Problems:   Injection of illicit drug within last 12 months   Syncope   Abscess in epidural space of cervical spine   Opioid dependence, daily use (HCC)   Positive hepatitis C antibody test   Severe sepsis (HCC)   . Chlorhexidine Gluconate Cloth  6 each Topical Daily  . gabapentin  100 mg Oral TID  . nicotine  14 mg Transdermal Daily  . rifampin  300 mg Oral BID WC  . sodium chloride flush  10-40 mL Intracatheter Q12H    SUBJECTIVE:  Afebrile overnight with no acute events. Feeling better and better each day. Has been up and walking around.   Allergies  Allergen Reactions  . Shellfish Allergy Anaphylaxis     Review of Systems: Review of Systems  Constitutional: Negative for chills, fever and weight loss.  Respiratory: Negative for cough, shortness of breath and wheezing.   Cardiovascular: Negative for chest pain and leg swelling.  Gastrointestinal: Negative for abdominal pain, constipation, diarrhea, nausea and vomiting.  Skin: Negative  for rash.      OBJECTIVE: Vitals:   01/03/20 2008 01/04/20 0440 01/04/20 0827 01/04/20 1214  BP: 123/83 105/67 118/87 122/82  Pulse: 96 (!) 49 78 (!) 105  Resp: 16 16 20 20   Temp: 98.3 F (36.8 C) 97.8 F (36.6 C) 97.9 F (36.6 C) 98 F (36.7 C)  TempSrc: Oral Oral Oral   SpO2: 99% 99% 96% 99%  Weight:      Height:       Body mass index is 20.8 kg/m.  Physical Exam Constitutional:      General: He is not in acute distress.    Appearance: He is well-developed.  Neck:     Comments: Honeycomb dressing in place; no evidence of infection.  Cardiovascular:     Rate and Rhythm: Normal rate and regular rhythm.     Heart sounds: Normal heart sounds.     Comments: PICC line left upper extremity with dressing that is clean and dry; site appears without evidence of infection.  Pulmonary:     Effort: Pulmonary effort is normal.     Breath sounds: Normal breath sounds.  Skin:    General: Skin is warm and dry.  Neurological:     Mental Status: He is alert and oriented to person, place, and time.  Psychiatric:        Behavior: Behavior normal.        Thought Content: Thought content normal.        Judgment: Judgment normal.     Lab Results Lab Results  Component Value Date   WBC 6.6 12/28/2019   HGB 11.7 (L) 12/28/2019   HCT 36.2 (L) 12/28/2019   MCV 80.1 12/28/2019   PLT 269 12/28/2019    Lab Results  Component Value Date   CREATININE 1.00 01/03/2020   BUN 24 (H) 01/03/2020   NA 140 01/03/2020   K 3.7 01/03/2020   CL 103 01/03/2020   CO2 27 01/03/2020    Lab Results  Component Value Date   ALT 13 12/20/2019   AST 10 (L) 12/20/2019   ALKPHOS 89 12/20/2019   BILITOT 0.6 12/20/2019     Microbiology: No results found for this or any previous visit (from the past 240 hour(s)).   Terri Piedra, NP [facility]  01/04/2020  1:10 PM

## 2020-01-05 ENCOUNTER — Inpatient Hospital Stay (HOSPITAL_COMMUNITY): Payer: Self-pay

## 2020-01-05 LAB — CBC
HCT: 36.7 % — ABNORMAL LOW (ref 39.0–52.0)
Hemoglobin: 11.9 g/dL — ABNORMAL LOW (ref 13.0–17.0)
MCH: 26.4 pg (ref 26.0–34.0)
MCHC: 32.4 g/dL (ref 30.0–36.0)
MCV: 81.4 fL (ref 80.0–100.0)
Platelets: 201 10*3/uL (ref 150–400)
RBC: 4.51 MIL/uL (ref 4.22–5.81)
RDW: 14.3 % (ref 11.5–15.5)
WBC: 5.3 10*3/uL (ref 4.0–10.5)
nRBC: 0 % (ref 0.0–0.2)

## 2020-01-05 LAB — MAGNESIUM: Magnesium: 1.8 mg/dL (ref 1.7–2.4)

## 2020-01-05 LAB — BASIC METABOLIC PANEL
Anion gap: 11 (ref 5–15)
BUN: 16 mg/dL (ref 6–20)
CO2: 26 mmol/L (ref 22–32)
Calcium: 9.3 mg/dL (ref 8.9–10.3)
Chloride: 100 mmol/L (ref 98–111)
Creatinine, Ser: 0.85 mg/dL (ref 0.61–1.24)
GFR calc Af Amer: >60 mL/min (ref >60)
GFR calc non Af Amer: >60 mL/min (ref >60)
Glucose, Bld: 99 mg/dL (ref 70–99)
Potassium: 3.9 mmol/L (ref 3.5–5.1)
Sodium: 137 mmol/L (ref 135–145)

## 2020-01-05 MED ORDER — MICONAZOLE NITRATE POWD: Freq: Two times a day (BID) | Status: DC

## 2020-01-05 NOTE — Progress Notes (Addendum)
Occupational Therapy Treatment Patient Details Name: Michael Lester MRN: [id number] DOB: [birth year] Today's Date: 01/05/2020    History of present illness Michael Lester is a 34 y.o. male with medical history significant for IV heroin abuse, now presenting to the emergency department after he was found unresponsive and complaining of headache and neck pain.  Patient reports 1 to 2 weeks of pain near the base of his neck that radiates to the right shoulder and has been associated with numbness involving the right second and third fingers. Pt now s/p C5-7 ACDF due to osteomyeltis discitis with epidural abscess.   OT comments  Pt demonstrating good progress towards established OT goals. Pt performing RUE exercises demonstrating good understanding and increased strength; issued level II theraband. Provided education on compensatory techniques for UB dressing; pt verbalized understanding. Pt performing functional mobility in hallway with Supervision. Update dc plan to home when medically stable. Updated goals. Will continue to follow acutely as admitted.    Follow Up Recommendations  No OT follow up    Equipment Recommendations  None recommended by OT    Recommendations for Other Services      Precautions / Restrictions Precautions Precautions: Cervical Precaution Booklet Issued: Yes (comment) Precaution Comments: reviewed in context of BADL Required Braces or Orthoses: Cervical Brace Cervical Brace: Soft collar;Other (comment) Restrictions Weight Bearing Restrictions: No Other Position/Activity Restrictions: Cleared by Dr. Jordan Likes for gentle UE exercises with theraband.       Mobility Bed Mobility               General bed mobility comments: In recliner upon arrival  Transfers Overall transfer level: Independent                    Balance Overall balance assessment: No apparent balance deficits (not formally assessed)                                          ADL either performed or assessed with clinical judgement   ADL Overall ADL's : Needs assistance/impaired       Grooming Details (indicate cue type and reason): Educating pt on compensatory techniques for oral care         Upper Body Dressing : Supervision/safety;Sitting Upper Body Dressing Details (indicate cue type and reason): Educated pt on compensatory techniques for UB dressing and adhering to cervical precautions.                  Functional mobility during ADLs: Supervision/safety General ADL Comments: Educating pt on compensatory techniques for oral care, UB dressing, and functional transfers. Pt verbalizing understanding and very appreciative. Pt donning soft collar with supervision. Performed RUE exercises and issued level II theraband. Performing functional mobility in hallway.     Vision   Vision Assessment?: No apparent visual deficits   Perception     Praxis      Cognition Arousal/Alertness: Awake/alert Behavior During Therapy: WFL for tasks assessed/performed Overall Cognitive Status: Within Functional Limits for tasks assessed                                 General Comments: Very agreeable to therapy        Exercises Exercises: General Upper Extremity General Exercises - Upper Extremity Shoulder Horizontal ABduction: AROM;Both;10 reps;Seated;Theraband Theraband Level (Shoulder Horizontal Abduction): Level  2 (Red) Shoulder Horizontal ADduction: AROM;Both;10 reps;Seated;Theraband Theraband Level (Shoulder Horizontal Adduction): Level 2 (Red) Elbow Flexion: AROM;Right;10 reps;Seated;Theraband Theraband Level (Elbow Flexion): Level 2 (Red) Elbow Extension: AROM;Right;10 reps;Seated;Theraband Theraband Level (Elbow Extension): Level 2 (Red) Other Exercises Other Exercises: tricep stretch to manage pain for before and after exercise program   Shoulder Instructions       General Comments Issued level II theraband     Pertinent Vitals/ Pain       Pain Assessment: Faces Faces Pain Scale: No hurt Pain Intervention(s): Monitored during session  Home Living                                          Prior Functioning/Environment              Frequency  Min 1X/week        Progress Toward Goals  OT Goals(current goals can now be found in the care plan section)  Progress towards OT goals: Progressing toward goals  Acute Rehab OT Goals Patient Stated Goal: regian RUE strength OT Goal Formulation: With patient Time For Goal Achievement: 01/06/20 Potential to Achieve Goals: Good ADL Goals Pt Will Perform Eating: with modified independence Pt Will Perform Upper Body Bathing: with modified independence Pt Will Perform Upper Body Dressing: with modified independence Pt/caregiver will Perform Home Exercise Program: Increased strength;Right Upper extremity;With theraputty;With theraband;With written HEP provided  Plan Frequency needs to be updated;Discharge plan needs to be updated    Co-evaluation                 AM-PAC OT "6 Clicks" Daily Activity     Outcome Measure   Help from another person eating meals?: None Help from another person taking care of personal grooming?: None Help from another person toileting, which includes using toliet, bedpan, or urinal?: None Help from another person bathing (including washing, rinsing, drying)?: A Little Help from another person to put on and taking off regular upper body clothing?: A Little Help from another person to put on and taking off regular lower body clothing?: None 6 Click Score: 22    End of Session Equipment Utilized During Treatment: Cervical collar  OT Visit Diagnosis: Muscle weakness (generalized) (M62.81);Other symptoms and signs involving the nervous system (R29.898);Pain Pain - Right/Left: Right Pain - part of body: Arm   Activity Tolerance Patient tolerated treatment well   Patient Left in  chair;with call bell/phone within reach   Nurse Communication Mobility status        Time: 1610-9604 OT Time Calculation (min): 15 min  Charges: OT General Charges $OT Visit: 1 Visit OT Treatments $Therapeutic Exercise: 8-22 mins  [place], OTR/L Acute Rehab Pager: [phone] Office: [place] 01/05/2020, 4:30 PM

## 2020-01-05 NOTE — Progress Notes (Signed)
PROGRESS NOTE    Michael Lester  JIR:[id number] DOB: 11-16-1985 DOA: 12/19/2019 PCP: Patient, [name]     Brief Narrative:  Michael Lester is an 34 y.o. WM PMHx IVDU   Presents to [facility] after found unresponsive complaining of headache, neck pain right shoulder pain, he was found to be septic with tachycardia and leukocytosis CT of the C-spine show some lucency in C6-C7 MRI of the spine showed discitis and osteomyelitis of C6-C7 and paravertebral edema resulting in phlegmon with with spinal stenosis and cord mass-effect.    Subjective: 3/9 A/O x4, negative S OB, negative DOE.  Negative C-spine pain    Assessment & Plan:   Principal Problem:   Osteomyelitis of C6-7 Active Problems:   Injection of illicit drug within last 12 months   Syncope   Abscess in epidural space of cervical spine   Opioid dependence, daily use (HCC)   Positive hepatitis C antibody test   Severe sepsis (HCC)   Sepsis due to discitis/osteomyelitis of C6-7: -Anterior cervical discectomy with interbody fusion by Dr. Dutch Quint on 12/22/2019. -Blood cultures show coag negative staph 1 out of 2 bottles likely contaminant. -Repeated blood cultures on 12/20/2019 have remained negative till date. -Patient will continue IV vancomycin Rocephin and rifampin a minimum of 6 weeks Started 01/18/2020  Recurrent syncope: -Echo was unremarkable EKG no significant findings question due to drug use in the setting of sepsis.  Injection of illicit drug within last 12 months: -He relates he went to detox about 4 weeks prior to admission -On admission no urine tox screen obtained  Normocytic anemia: -Likely due to anemia of chronic disease. -We will need follow-up as an outpatient.  Constipation: -Resolved with MiraLAX.  Tinea cruris -Miconazole BID   DVT prophylaxis: SCD Code Status: Full Family Communication:  Disposition Plan: Patient will have to remain in hospital or SNF until completion of all IV antibiotics.   IV drug addict.   Consultants:  Neurosurgery  ID   Procedures/Significant Events:    I have personally reviewed and interpreted all radiology studies and my findings are as above.  VENTILATOR SETTINGS:    Cultures   Antimicrobials: Anti-infectives (From admission, onward)   Start     Dose/Rate Stop   12/31/19 1400  vancomycin (VANCOCIN) IVPB 1000 mg/200 mL premix     1,000 mg 200 mL/hr over 60 Minutes 02/13/20 2359   12/26/19 0800  rifampin (RIFADIN) capsule 300 mg     300 mg 02/13/20 2359   12/22/19 1938  bacitracin 50,000 Units in sodium chloride 0.9 % 500 mL irrigation  Status:  Discontinued      12/22/19 2048   12/22/19 1821  ceFAZolin (ANCEF) 2-4 GM/100ML-% IVPB    Note to Pharmacy: Little Ishikawa   : cabinet override    12/23/19 0629   12/22/19 1000  vancomycin (VANCOREADY) IVPB 1250 mg/250 mL  Status:  Discontinued     1,250 mg 166.7 mL/hr over 90 Minutes 12/31/19 0339   12/20/19 0600  vancomycin (VANCOREADY) IVPB 1750 mg/350 mL  Status:  Discontinued     1,750 mg 175 mL/hr over 120 Minutes 12/21/19 2301   12/20/19 0030  cefTRIAXone (ROCEPHIN) 2 g in sodium chloride 0.9 % 100 mL IVPB     2 g 200 mL/hr over 30 Minutes 02/13/20 2359       Devices    LINES / TUBES:      Continuous Infusions: . cefTRIAXone (ROCEPHIN)  IV 2 g (01/05/20 0122)  .  lactated ringers 200 mL/hr at 12/22/19 2004  . vancomycin 1,000 mg (01/05/20 0245)     Objective: Vitals:   01/04/20 2000 01/04/20 2318 01/05/20 0317 01/05/20 0723  BP: 118/83 120/78 119/75 99/65  Pulse: (!) 102 83 78 81  Resp: 18 17 19 20   Temp: 98.4 F (36.9 C) 98 F (36.7 C) 97.8 F (36.6 C) 98.1 F (36.7 C)  TempSrc: Oral Oral Oral   SpO2: 98% 98% 97%   Weight:      Height:        Intake/Output Summary (Last 24 hours) at 01/05/2020 0815 Last data filed at 01/04/2020 1700 Gross per 24 hour  Intake 240 ml  Output --  Net 240 ml   Filed Weights   12/20/19 0135  Weight: 71.5 kg   Physical  Exam:  General: A/O x4 no acute respiratory distress Eyes: negative scleral hemorrhage, negative anisocoria, negative icterus ENT: Negative Runny nose, negative gingival bleeding, Neck:  Negative scars, masses, torticollis, lymphadenopathy, JVD, right lower neck surgical dressing in place clean negative sign of infection Lungs: Clear to auscultation bilaterally without wheezes or crackles Cardiovascular: Regular rate and rhythm without murmur gallop or rub normal S1 and S2 Abdomen: negative abdominal pain, nondistended, positive soft, bowel sounds, no rebound, no ascites, no appreciable mass Extremities: No significant cyanosis, clubbing, or edema bilateral lower extremities Skin: Erythematous rash around scrotum and inner thigh Psychiatric:  Negative depression, negative anxiety, negative fatigue, negative mania  Central nervous system:  Cranial nerves II through XII intact, tongue/uvula midline, all extremities muscle strength 5/5, sensation intact throughout, negative dysarthria, negative expressive aphasia, negative receptive aphasia.      Data Reviewed: Care during the described time interval was provided by me .  I have reviewed this patient's available data, including medical history, events of note, physical examination, and all test results as part of my evaluation.  CBC: Recent Labs  Lab 01/05/20 0500  WBC 5.3  HGB 11.9*  HCT 36.7*  MCV 81.4  PLT 201   Basic Metabolic Panel: Recent Labs  Lab 12/31/19 0903 01/03/20 0130 01/05/20 0500  NA 137 140 137  K 3.7 3.7 3.9  CL 102 103 100  CO2 24 27 26   GLUCOSE 112* 101* 99  BUN 20 24* 16  CREATININE 0.87 1.00 0.85  CALCIUM 9.3 9.0 9.3  MG  --   --  1.8   GFR: Estimated Creatinine Clearance: 122.7 mL/min (by C-G formula based on SCr of 0.85 mg/dL). Liver Function Tests: No results for input(s): AST, ALT, ALKPHOS, BILITOT, PROT, ALBUMIN in the last 168 hours. No results for input(s): LIPASE, AMYLASE in the last 168  hours. No results for input(s): AMMONIA in the last 168 hours. Coagulation Profile: No results for input(s): INR, PROTIME in the last 168 hours. Cardiac Enzymes: No results for input(s): CKTOTAL, CKMB, CKMBINDEX, TROPONINI in the last 168 hours. BNP (last 3 results) No results for input(s): PROBNP in the last 8760 hours. HbA1C: No results for input(s): HGBA1C in the last 72 hours. CBG: No results for input(s): GLUCAP in the last 168 hours. Lipid Profile: No results for input(s): CHOL, HDL, LDLCALC, TRIG, CHOLHDL, LDLDIRECT in the last 72 hours. Thyroid Function Tests: No results for input(s): TSH, T4TOTAL, FREET4, T3FREE, THYROIDAB in the last 72 hours. Anemia Panel: No results for input(s): VITAMINB12, FOLATE, FERRITIN, TIBC, IRON, RETICCTPCT in the last 72 hours. Sepsis Labs: No results for input(s): PROCALCITON, LATICACIDVEN in the last 168 hours.  No results found for this  or any previous visit (from the past 240 hour(s)).       Radiology Studies: No results found.      Scheduled Meds: . Chlorhexidine Gluconate Cloth  6 each Topical Daily  . gabapentin  100 mg Oral TID  . nicotine  14 mg Transdermal Daily  . rifampin  300 mg Oral BID WC  . sodium chloride flush  10-40 mL Intracatheter Q12H   Continuous Infusions: . cefTRIAXone (ROCEPHIN)  IV 2 g (01/05/20 0122)  . lactated ringers 200 mL/hr at 12/22/19 2004  . vancomycin 1,000 mg (01/05/20 0245)     LOS: 17 days    Time spent:40 min    Hayes Rehfeldt, Geraldo Docker, MD [facility] Pager [phone]  If 7PM-7AM, please contact night-coverage [email or web address] Password [facility] 2 01/05/2020, 8:15 AM

## 2020-01-05 NOTE — Plan of Care (Signed)

## 2020-01-06 LAB — BASIC METABOLIC PANEL
Anion gap: 9 (ref 5–15)
BUN: 16 mg/dL (ref 6–20)
CO2: 26 mmol/L (ref 22–32)
Calcium: 9.3 mg/dL (ref 8.9–10.3)
Chloride: 103 mmol/L (ref 98–111)
Creatinine, Ser: 0.81 mg/dL (ref 0.61–1.24)
GFR calc Af Amer: >60 mL/min (ref >60)
GFR calc non Af Amer: >60 mL/min (ref >60)
Glucose, Bld: 94 mg/dL (ref 70–99)
Potassium: 4.2 mmol/L (ref 3.5–5.1)
Sodium: 138 mmol/L (ref 135–145)

## 2020-01-06 LAB — CBC
HCT: 36.5 % — ABNORMAL LOW (ref 39.0–52.0)
Hemoglobin: 11.8 g/dL — ABNORMAL LOW (ref 13.0–17.0)
MCH: 26.4 pg (ref 26.0–34.0)
MCHC: 32.3 g/dL (ref 30.0–36.0)
MCV: 81.7 fL (ref 80.0–100.0)
Platelets: 188 10*3/uL (ref 150–400)
RBC: 4.47 MIL/uL (ref 4.22–5.81)
RDW: 14.5 % (ref 11.5–15.5)
WBC: 5 10*3/uL (ref 4.0–10.5)
nRBC: 0 % (ref 0.0–0.2)

## 2020-01-06 LAB — MAGNESIUM: Magnesium: 1.9 mg/dL (ref 1.7–2.4)

## 2020-01-06 NOTE — Progress Notes (Signed)
PROGRESS NOTE    Michael Lester  GEX:[id number] DOB: July 21, 1986 DOA: 12/19/2019 PCP: Patient, [name]    Brief Narrative:  Patient was admitted to the hospital (from [facility]) with the working diagnosis of C6-C7, discitis osteomyelitis.   34 year old male who presented to the hospital with a chief complaint of neck pain.  He does have significant past medical history for IV heroin abuse, he was found unresponsive apparently had a headache and neck pain.  He reported 1 to 2 weeks of neck pain, at the base of the neck radiated to the right shoulder, associated with numbness involving the right second and third fingers.  He had recurrent episodes of syncope.  He acknowledge ongoing daily IV heroin use.  On his initial physical examination blood pressure 123/83, heart rate 60, respiratory rate 10, temperature 98.3, oxygen saturation 97%, his lungs are clear to auscultation bilaterally, heart S1-S2 present rhythmic, soft abdomen, no lower extremity edema, neurologically patient was nonfocal. Cervical MRI showed discitis osteomyelitis C6-C7, mild retrolisthesis at the level of the prevertebral fluid and edema, and phlegmon in the ventral epidural space resulting in spinal stenosis and spinal cord mass-effect.  Patient was placed on broad-spectrum antibiotics and underwent anterior cervical discectomy with interbody fusion by Dr. Trenton Lester on December 22, 2019.   Currently on IV vancomycin, ceftriaxone and rifampin, plan to complete 6 weeks of therapy (started 12/22/2019 complete 02/02/20).  Plan to complete as inpatient due to history of IV drug abuse.  Assessment & Plan:   Principal Problem:   Osteomyelitis of C6-7 Active Problems:   Injection of illicit drug within last 12 months   Syncope   Abscess in epidural space of cervical spine   Opioid dependence, daily use (HCC)   Positive hepatitis C antibody test   Severe sepsis (HCC)   1. C6-C7 osteomyelitis and discitis. Cervical pain is controlled  with analgesics, will continue antibiotic therapy with vancomycin and cefriaxone with end date on 02/02/20.    2. Chronic anemia Multifactorial, will continue cell count monitoring.  3. Substance abuse. No signs of withdrawal.    DVT prophylaxis: enoxaparin   Code Status:  full Family Communication: no family at the bedside  Disposition Plan/ discharge barriers: patient from home, barrier for dc is continuous IV antibiotic therapy, that can't be given as outpatient due to hx of substance abuse. Will complete therapy in the hospital.    Consultants:   ID  Neurosurgery   Procedures:   discectomy and fusion cervical spine   Antimicrobials:       Subjective: Patient is feeling well this am, neck pain is controlled, no nausea or vomiting, no dyspnea or chest pain.   Objective: Vitals:   01/05/20 1540 01/05/20 2047 01/06/20 0030 01/06/20 0755  BP: 130/79 128/86 125/74 91/66  Pulse: (!) 114 (!) 111 100 79  Resp: 20   18  Temp: 98.7 F (37.1 C) 98.2 F (36.8 C) 98.2 F (36.8 C) 98.1 F (36.7 C)  TempSrc:  Oral Oral Oral  SpO2: 99% 95% 99% 100%  Weight:      Height:        Intake/Output Summary (Last 24 hours) at 01/06/2020 0900 Last data filed at 01/05/2020 1849 Gross per 24 hour  Intake 720 ml  Output 3 ml  Net 717 ml   Filed Weights   12/20/19 0135  Weight: 71.5 kg    Examination:   General: deconditioned  Neurology: Awake and alert, non focal  E ENT: no pallor, no  icterus, oral mucosa moist Cardiovascular: No JVD. S1-S2 present, rhythmic, no gallops, rubs, or murmurs. No lower extremity edema. Pulmonary: positive breath sounds bilaterally, adequate air movement, no wheezing, rhonchi or rales. Gastrointestinal. Abdomen with no organomegaly, non tender, no rebound or guarding Skin. No rashes Musculoskeletal: no joint deformities     Data Reviewed: I have personally reviewed following labs and imaging studies  CBC: Recent Labs  Lab 01/05/20 0500    WBC 5.3  HGB 11.9*  HCT 36.7*  MCV 81.4  PLT 201   Basic Metabolic Panel: Recent Labs  Lab 12/31/19 0903 01/03/20 0130 01/05/20 0500  NA 137 140 137  K 3.7 3.7 3.9  CL 102 103 100  CO2 24 27 26   GLUCOSE 112* 101* 99  BUN 20 24* 16  CREATININE 0.87 1.00 0.85  CALCIUM 9.3 9.0 9.3  MG  --   --  1.8   GFR: Estimated Creatinine Clearance: 122.7 mL/min (by C-G formula based on SCr of 0.85 mg/dL). Liver Function Tests: No results for input(s): AST, ALT, ALKPHOS, BILITOT, PROT, ALBUMIN in the last 168 hours. No results for input(s): LIPASE, AMYLASE in the last 168 hours. No results for input(s): AMMONIA in the last 168 hours. Coagulation Profile: No results for input(s): INR, PROTIME in the last 168 hours. Cardiac Enzymes: No results for input(s): CKTOTAL, CKMB, CKMBINDEX, TROPONINI in the last 168 hours. BNP (last 3 results) No results for input(s): PROBNP in the last 8760 hours. HbA1C: No results for input(s): HGBA1C in the last 72 hours. CBG: No results for input(s): GLUCAP in the last 168 hours. Lipid Profile: No results for input(s): CHOL, HDL, LDLCALC, TRIG, CHOLHDL, LDLDIRECT in the last 72 hours. Thyroid Function Tests: No results for input(s): TSH, T4TOTAL, FREET4, T3FREE, THYROIDAB in the last 72 hours. Anemia Panel: No results for input(s): VITAMINB12, FOLATE, FERRITIN, TIBC, IRON, RETICCTPCT in the last 72 hours.    Radiology Studies: I have reviewed all of the imaging during this hospital visit personally     Scheduled Meds: . Chlorhexidine Gluconate Cloth  6 each Topical Daily  . gabapentin  100 mg Oral TID  . miconazole nitrate   Topical BID  . nicotine  14 mg Transdermal Daily  . rifampin  300 mg Oral BID WC  . sodium chloride flush  10-40 mL Intracatheter Q12H   Continuous Infusions: . cefTRIAXone (ROCEPHIN)  IV 2 g (01/06/20 0128)  . lactated ringers 200 mL/hr at 12/22/19 2004  . vancomycin 1,000 mg (01/06/20 0216)     LOS: 18 days         Michael Lester 03/07/20, MD

## 2020-01-06 NOTE — Progress Notes (Signed)
Occupational Therapy Treatment Patient Details Name: Michael Lester MRN: [id number] DOB: 1986-06-16 Today's Date: 01/06/2020    History of present illness Michael Lester is a 34 y.o. male with medical history significant for IV heroin abuse, now presenting to the emergency department after he was found unresponsive and complaining of headache and neck pain.  Patient reports 1 to 2 weeks of pain near the base of his neck that radiates to the right shoulder and has been associated with numbness involving the right second and third fingers. Pt now s/p C5-7 ACDF due to osteomyeltis discitis with epidural abscess.   OT comments  Pt presents supine in bed pleasant and agreeable to therapy session. Pt able to return demonstrate UE HEP using level 2 theraband with min cues for technique provided. Provided additional postural stretches/exercises during activity completion. Additional focus on review of cervical precautions and applying compensatory techniques during functional tasks as pt requiring min cues for reinforcement of precautions during session. Discussed positions for sleeping to maximize comfort while maintaining proper alignment with pt verbalizing understanding throughout. Will continue per POC at this time.   Follow Up Recommendations  No OT follow up    Equipment Recommendations  None recommended by OT          Precautions / Restrictions Precautions Precautions: Cervical Precaution Booklet Issued: Yes (comment) Precaution Comments: reviewed in context of BADL Required Braces or Orthoses: Cervical Brace Cervical Brace: Soft collar;Other (comment) Restrictions Weight Bearing Restrictions: No Other Position/Activity Restrictions: Cleared by Dr. Jordan Likes for gentle UE exercises with theraband.       Mobility Bed Mobility Overal bed mobility: Modified Independent                Transfers Overall transfer level: Independent                    Balance Overall balance  assessment: No apparent balance deficits (not formally assessed)                                         ADL either performed or assessed with clinical judgement   ADL Overall ADL's : Needs assistance/impaired       Grooming Details (indicate cue type and reason): reviewed compensatory techniques for oral care                             Functional mobility during ADLs: Supervision/safety General ADL Comments: further reviewed cervical precautions and compensatory strateiges in functional context. Reviewed HEP and provided pt with additional stretching/postural exercises. pt requiring min cues for adherence to precautions during session     Vision   Vision Assessment?: No apparent visual deficits   Perception     Praxis      Cognition Arousal/Alertness: Awake/alert Behavior During Therapy: WFL for tasks assessed/performed Overall Cognitive Status: Within Functional Limits for tasks assessed                                 General Comments: Very agreeable to therapy        Exercises Exercises: General Upper Extremity General Exercises - Upper Extremity Shoulder Horizontal ABduction: AROM;Both;10 reps;Seated;Theraband Theraband Level (Shoulder Horizontal Abduction): Level 2 (Red) Shoulder Horizontal ADduction: AROM;Both;10 reps;Seated;Theraband Theraband Level (Shoulder Horizontal Adduction): Level 2 (Red) Elbow Flexion:  AROM;Right;10 reps;Seated;Theraband Theraband Level (Elbow Flexion): Level 2 (Red) Elbow Extension: AROM;Right;10 reps;Seated;Theraband Theraband Level (Elbow Extension): Level 2 (Red) Other Exercises Other Exercises: scapular retract and hold x3 sec, x10 reps  Other Exercises: tricep stretch to manage pain for before and after exercise program   Shoulder Instructions       General Comments      Pertinent Vitals/ Pain       Pain Assessment: No/denies pain Faces Pain Scale: No hurt  Home Living                                           Prior Functioning/Environment              Frequency  Min 1X/week        Progress Toward Goals  OT Goals(current goals can now be found in the care plan section)  Progress towards OT goals: Progressing toward goals  Acute Rehab OT Goals Patient Stated Goal: regain RUE strength OT Goal Formulation: With patient Time For Goal Achievement: 01/06/20 Potential to Achieve Goals: Good ADL Goals Pt Will Perform Eating: with modified independence Pt Will Perform Upper Body Bathing: with modified independence Pt Will Perform Upper Body Dressing: with modified independence Pt/caregiver will Perform Home Exercise Program: Increased strength;Right Upper extremity;With theraputty;With theraband;With written HEP provided  Plan Frequency needs to be updated;Discharge plan needs to be updated    Co-evaluation                 AM-PAC OT "6 Clicks" Daily Activity     Outcome Measure   Help from another person eating meals?: None Help from another person taking care of personal grooming?: None Help from another person toileting, which includes using toliet, bedpan, or urinal?: None Help from another person bathing (including washing, rinsing, drying)?: A Little Help from another person to put on and taking off regular upper body clothing?: A Little Help from another person to put on and taking off regular lower body clothing?: None 6 Click Score: 22    End of Session Equipment Utilized During Treatment: Cervical collar  OT Visit Diagnosis: Muscle weakness (generalized) (M62.81);Other symptoms and signs involving the nervous system (R29.898);Pain Pain - Right/Left: Right Pain - part of body: Arm   Activity Tolerance Patient tolerated treatment well   Patient Left in chair;with call bell/phone within reach   Nurse Communication Mobility status        Time: 3382-5053 OT Time Calculation (min): 28 min  Charges: OT  General Charges $OT Visit: 1 Visit OT Treatments $Self Care/Home Management : 8-22 mins $Therapeutic Activity: 8-22 mins  Lou Cal, OT Acute Rehabilitation Services Pager [phone] Office [place] 01/06/2020, 2:16 PM

## 2020-01-06 NOTE — Progress Notes (Signed)
Pharmacy Antibiotic Note  WILIAM CAUTHORN is a 34 y.o. male admitted on 12/19/2019 with osteomyelitis of the cervical spine.  Pharmacy has been consulted for vancomycin dosing. Vanc peak 32, trough 9 >> AUC 496, at goal on 3/7  Plan: This patient's current antibiotics will be continued without adjustments.  Next levels planned for 3/14  Temp (24hrs), Avg:98.3 F (36.8 C), Min:98.1 F (36.7 C), Max:98.7 F (37.1 C)  Recent Labs  Lab 12/31/19 0030 12/31/19 0903 01/02/20 1631 01/03/20 0130 01/05/20 0500 01/06/20 0914  WBC  --   --   --   --  5.3 5.0  CREATININE  --  0.87  --  1.00 0.85 0.81  VANCOTROUGH  --  11*  --  9*  --   --   VANCOPEAK 72*  --  32  --   --   --     Estimated Creatinine Clearance: 128.7 mL/min (by C-G formula based on SCr of 0.81 mg/dL).    Allergies  Allergen Reactions  . Shellfish Allergy Anaphylaxis   Thank you Okey Regal, PharmD 01/06/2020 11:19 AM

## 2020-01-06 NOTE — Plan of Care (Signed)

## 2020-01-07 NOTE — Progress Notes (Signed)
PROGRESS NOTE    Michael Lester  VPX:[id number] DOB: [birth year] DOA: 12/19/2019 PCP: Patient, [name]    Brief Narrative:  Patient was admitted to the hospital (from [facility]) with the working diagnosis of C6-C7, discitis osteomyelitis.   34 year old male who presented to the hospital with a chief complaint of neck pain.  He does have significant past medical history for IV heroin abuse, he was found unresponsive apparently had a headache and neck pain.  He reported 1 to 2 weeks of neck pain, at the base of the neck radiated to the right shoulder, associated with numbness involving the right second and third fingers.  He had recurrent episodes of syncope.  He acknowledge ongoing daily IV heroin use.  On his initial physical examination blood pressure 123/83, heart rate 60, respiratory rate 10, temperature 98.3, oxygen saturation 97%, his lungs are clear to auscultation bilaterally, heart S1-S2 present rhythmic, soft abdomen, no lower extremity edema, neurologically patient was nonfocal. Cervical MRI showed discitis osteomyelitis C6-C7, mild retrolisthesis at the level of the prevertebral fluid and edema, and phlegmon in the ventral epidural space resulting in spinal stenosis and spinal cord mass-effect.  Patient was placed on broad-spectrum antibiotics and underwent anterior cervical discectomy with interbody fusion by Dr. Dutch Quint on December 22, 2019.   Currently on IV vancomycin, ceftriaxone and rifampin, plan to complete 6 weeks of therapy (started 12/22/2019 complete 02/02/20).  Plan to complete as inpatient due to history of IV drug abuse.   Assessment & Plan:   Principal Problem:   Osteomyelitis of C6-7 Active Problems:   Injection of illicit drug within last 12 months   Syncope   Abscess in epidural space of cervical spine   Opioid dependence, daily use (HCC)   Positive hepatitis C antibody test   Severe sepsis (HCC)   1. C6-C7 osteomyelitis and discitis. Cervical pain continue to  be well controlled with analgesics. Tolerating well antibiotic therapy with vancomycin and cefriaxone with end date on 02/02/20.    2. Chronic anemia Multifactorial, will follow cell count in 48 H.   3. Substance abuse. No clinical signs of withdrawal.    DVT prophylaxis: enoxaparin   Code Status:  full Family Communication: no family at the bedside  Disposition Plan/ discharge barriers: patient from home, barrier for dc is continuous IV antibiotic therapy, that can't be given as outpatient due to hx of substance abuse. Will complete therapy in the hospital.     Consultants:   ID  Neurosurgery   Procedures:   discectomy and fusion cervical spine   Antimicrobials:    Vancomycin and ceftriaxone to complete on 02/02/20.   Subjective: Patient continue to feel well, neck pain is controlled with analgesics, no nausea or vomiting, no chest pain. Asking to go outside the hospital to see his 64 yo son. Has been ambulating and out of bed.   Objective: Vitals:   01/06/20 1938 01/06/20 2349 01/07/20 0354 01/07/20 0729  BP: 116/80 117/71 108/60 127/81  Pulse: (!) 110 80 83 77  Resp:    18  Temp: 97.9 F (36.6 C) 98 F (36.7 C) 98 F (36.7 C) 98.2 F (36.8 C)  TempSrc: Oral Oral Oral Oral  SpO2: 96% 97% 98% 100%  Weight:      Height:        Intake/Output Summary (Last 24 hours) at 01/07/2020 0922 Last data filed at 01/06/2020 1245 Gross per 24 hour  Intake 480 ml  Output --  Net 480 ml  Filed Weights   12/20/19 0135  Weight: 71.5 kg    Examination:   General: Not in pain or dyspnea.  Neurology: Awake and alert, non focal  E ENT: no pallor, no icterus, oral mucosa moist Cardiovascular: No JVD. S1-S2 present, rhythmic, no gallops, rubs, or murmurs. No lower extremity edema. Pulmonary: positive breath sounds bilaterally, adequate air movement, no wheezing, rhonchi or rales. Gastrointestinal. Abdomen with, no organomegaly, non tender, no rebound or  guarding Skin. No rashes Musculoskeletal: no joint deformities     Data Reviewed: I have personally reviewed following labs and imaging studies  CBC: Recent Labs  Lab 01/05/20 0500 01/06/20 0914  WBC 5.3 5.0  HGB 11.9* 11.8*  HCT 36.7* 36.5*  MCV 81.4 81.7  PLT 201 101   Basic Metabolic Panel: Recent Labs  Lab 01/03/20 0130 01/05/20 0500 01/06/20 0914  NA 140 137 138  K 3.7 3.9 4.2  CL 103 100 103  CO2 27 26 26   GLUCOSE 101* 99 94  BUN 24* 16 16  CREATININE 1.00 0.85 0.81  CALCIUM 9.0 9.3 9.3  MG  --  1.8 1.9   GFR: Estimated Creatinine Clearance: 128.7 mL/min (by C-G formula based on SCr of 0.81 mg/dL). Liver Function Tests: No results for input(s): AST, ALT, ALKPHOS, BILITOT, PROT, ALBUMIN in the last 168 hours. No results for input(s): LIPASE, AMYLASE in the last 168 hours. No results for input(s): AMMONIA in the last 168 hours. Coagulation Profile: No results for input(s): INR, PROTIME in the last 168 hours. Cardiac Enzymes: No results for input(s): CKTOTAL, CKMB, CKMBINDEX, TROPONINI in the last 168 hours. BNP (last 3 results) No results for input(s): PROBNP in the last 8760 hours. HbA1C: No results for input(s): HGBA1C in the last 72 hours. CBG: No results for input(s): GLUCAP in the last 168 hours. Lipid Profile: No results for input(s): CHOL, HDL, LDLCALC, TRIG, CHOLHDL, LDLDIRECT in the last 72 hours. Thyroid Function Tests: No results for input(s): TSH, T4TOTAL, FREET4, T3FREE, THYROIDAB in the last 72 hours. Anemia Panel: No results for input(s): VITAMINB12, FOLATE, FERRITIN, TIBC, IRON, RETICCTPCT in the last 72 hours.    Radiology Studies: I have reviewed all of the imaging during this hospital visit personally     Scheduled Meds: . Chlorhexidine Gluconate Cloth  6 each Topical Daily  . gabapentin  100 mg Oral TID  . miconazole nitrate   Topical BID  . nicotine  14 mg Transdermal Daily  . rifampin  300 mg Oral BID WC  . sodium  chloride flush  10-40 mL Intracatheter Q12H   Continuous Infusions: . cefTRIAXone (ROCEPHIN)  IV 2 g (01/06/20 2357)  . lactated ringers 200 mL/hr at 12/22/19 2004  . vancomycin 1,000 mg (01/07/20 0230)     LOS: 19 days        Parish Dubose Gerome Apley, MD

## 2020-01-08 DIAGNOSIS — B182 Chronic viral hepatitis C: Secondary | ICD-10-CM

## 2020-01-08 NOTE — Progress Notes (Signed)
PROGRESS NOTE    ANASTASIO WOGAN  QQV:[id number] DOB: May 05, 1986 DOA: 12/19/2019 PCP: Patient, [name]    Brief Narrative:  Patient was admitted to the hospital(from [place]) with theworking diagnosis of C6-C7, discitis osteomyelitis.  34 year old male who presented to the hospital with a chief complaint of neck pain. He does have significant past medical history for IV heroin abuse, he was found unresponsive apparently had a headache and neck pain. He reported 1 to 2 weeks of neck pain, at the base of the neck radiated to the right shoulder, associated with numbness involving the right second and third fingers. He had recurrent episodes of syncope. He acknowledge ongoing daily IV heroin use. On his initial physical examination blood pressure 123/83, heart rate 60, respiratory rate 10, temperature 98.3, oxygen saturation97%,his lungs are clear to auscultation bilaterally, heart S1-S2 present rhythmic, soft abdomen, no lower extremity edema, neurologically patient was nonfocal. Cervical MRI showed discitis osteomyelitis C6-C7,mild retrolisthesis at the level of the prevertebral fluid and edema, and phlegmon in the ventral epidural space resulting in spinal stenosis and spinal cord mass-effect.  Patient was placed on broad-spectrum antibiotics and underwent anterior cervical discectomy with interbody fusion by Dr. Hillery Jacks December 22, 2019.  Currently on IV vancomycin, ceftriaxone and rifampin,plan to complete 6 weeks of therapy(started2/23/2021complete 02/02/20).Plan to complete as inpatient due to history of IV drug abuse.  Patient has been tolerating antibiotic therapy well.   Assessment & Plan:   Principal Problem:   Osteomyelitis of C6-7 Active Problems:   Injection of illicit drug within last 12 months   Syncope   Abscess in epidural space of cervical spine   Opioid dependence, daily use (HCC)   Positive hepatitis C antibody test   Severe sepsis  (HCC)     1. C6-C7 osteomyelitis and discitis. No cervical pain reported. Continue tolerating well IV antibiotic therapy, vancomycin and ceftriaxone. Plan for dc on 02/02/20.   2. Chronic anemia Multifactorial, follow cell count in 24 H   3. Substance abuse. No current signs clinical signs of withdrawal.Continue neuro checks per unit protocol.    DVT prophylaxis:enoxaparin Code Status:full Family Communication:no family at the bedside Disposition Plan/ discharge barriers:patient from home, barrier for dc is continuous IV antibiotic therapy, that can't be given as outpatient due to hx of substance abuse. Will complete therapy in the hospital.   Consultants:   ID   Neurosurgery   Procedures:  discectomy and fusion cervical spine  Antimicrobials:   Vancomycin and ceftriaxone to complete on 02/02/20.   Subjective: Patient is feeling well, has been ambulating, no nausea or vomiting, no chest pain or dyspnea.   Objective: Vitals:   01/07/20 2050 01/08/20 0009 01/08/20 0331 01/08/20 0727  BP: 128/82 105/75 95/64 118/78  Pulse: 96 76 78 80  Resp: 20 16 18 19   Temp: 97.8 F (36.6 C) 98.2 F (36.8 C) 97.8 F (36.6 C) 98.2 F (36.8 C)  TempSrc: Oral Oral Oral Oral  SpO2: 98% 98% 97% 99%  Weight:      Height:        Intake/Output Summary (Last 24 hours) at 01/08/2020 0935 Last data filed at 01/07/2020 1245 Gross per 24 hour  Intake 240 ml  Output --  Net 240 ml   Filed Weights   12/20/19 0135  Weight: 71.5 kg    Examination:   General: Not in pain or dyspnea.  Neurology: Awake and alert, non focal  E ENT: no pallor, no icterus, oral mucosa moist Cardiovascular: No JVD.  S1-S2 present, rhythmic, no gallops, rubs, or murmurs. No lower extremity edema. Pulmonary: positive breath sounds bilaterally, adequate air movement, no wheezing, rhonchi or rales. Gastrointestinal. Abdomen with no organomegaly, non tender, no rebound or guarding Skin. No  rashes Musculoskeletal: no joint deformities     Data Reviewed: I have personally reviewed following labs and imaging studies  CBC: Recent Labs  Lab 01/05/20 0500 01/06/20 0914  WBC 5.3 5.0  HGB 11.9* 11.8*  HCT 36.7* 36.5*  MCV 81.4 81.7  PLT 201 188   Basic Metabolic Panel: Recent Labs  Lab 01/03/20 0130 01/05/20 0500 01/06/20 0914  NA 140 137 138  K 3.7 3.9 4.2  CL 103 100 103  CO2 27 26 26   GLUCOSE 101* 99 94  BUN 24* 16 16  CREATININE 1.00 0.85 0.81  CALCIUM 9.0 9.3 9.3  MG  --  1.8 1.9   GFR: Estimated Creatinine Clearance: 128.7 mL/min (by C-G formula based on SCr of 0.81 mg/dL). Liver Function Tests: No results for input(s): AST, ALT, ALKPHOS, BILITOT, PROT, ALBUMIN in the last 168 hours. No results for input(s): LIPASE, AMYLASE in the last 168 hours. No results for input(s): AMMONIA in the last 168 hours. Coagulation Profile: No results for input(s): INR, PROTIME in the last 168 hours. Cardiac Enzymes: No results for input(s): CKTOTAL, CKMB, CKMBINDEX, TROPONINI in the last 168 hours. BNP (last 3 results) No results for input(s): PROBNP in the last 8760 hours. HbA1C: No results for input(s): HGBA1C in the last 72 hours. CBG: No results for input(s): GLUCAP in the last 168 hours. Lipid Profile: No results for input(s): CHOL, HDL, LDLCALC, TRIG, CHOLHDL, LDLDIRECT in the last 72 hours. Thyroid Function Tests: No results for input(s): TSH, T4TOTAL, FREET4, T3FREE, THYROIDAB in the last 72 hours. Anemia Panel: No results for input(s): VITAMINB12, FOLATE, FERRITIN, TIBC, IRON, RETICCTPCT in the last 72 hours.    Radiology Studies: I have reviewed all of the imaging during this hospital visit personally     Scheduled Meds: . Chlorhexidine Gluconate Cloth  6 each Topical Daily  . gabapentin  100 mg Oral TID  . miconazole nitrate   Topical BID  . nicotine  14 mg Transdermal Daily  . rifampin  300 mg Oral BID WC  . sodium chloride flush  10-40  mL Intracatheter Q12H   Continuous Infusions: . cefTRIAXone (ROCEPHIN)  IV 2 g (01/08/20 0008)  . lactated ringers 200 mL/hr at 12/22/19 2004  . vancomycin 1,000 mg (01/08/20 0204)     LOS: 20 days        Rayaan Garguilo 03/09/20, MD

## 2020-01-08 NOTE — Progress Notes (Signed)
    [facility]    Date of Admission:  12/19/2019   Total days of antibiotics 20   ID: Michael Lester is a 34 y.o. male with culture negative epidural abscess Principal Problem:   Osteomyelitis of C6-7 Active Problems:   Injection of illicit drug within last 12 months   Syncope   Abscess in epidural space of cervical spine   Opioid dependence, daily use (HCC)   Positive hepatitis C antibody test   Severe sepsis (HCC)    Subjective: Afebrile, mild neck pain but improving only taking pain meds in the morning and in the evening but would like to continue to work at decreasing. He has decrease sensation to pointer finger of right hand otherwise no other complaints. Numbness to hand has resolved.  Medications:  . Chlorhexidine Gluconate Cloth  6 each Topical Daily  . gabapentin  100 mg Oral TID  . miconazole nitrate   Topical BID  . nicotine  14 mg Transdermal Daily  . rifampin  300 mg Oral BID WC  . sodium chloride flush  10-40 mL Intracatheter Q12H    Objective: Vital signs in last 24 hours: Temp:  [97.8 F (36.6 C)-98.2 F (36.8 C)] 98.2 F (36.8 C) (03/12 1134) Pulse Rate:  [73-96] 73 (03/12 1134) Resp:  [16-20] 18 (03/12 1134) BP: (95-128)/(64-82) 113/66 (03/12 1134) SpO2:  [97 %-99 %] 99 % (03/12 1134) Physical Exam  Constitutional: He is oriented to person, place, and time. He appears well-developed and well-nourished. No distress.  HENT:  Mouth/Throat: Oropharynx is clear and moist. No oropharyngeal exudate.  Neck: anterior incision is c/d/i -bandage in place Cardiovascular: Normal rate, regular rhythm and normal heart sounds. Exam reveals no gallop and no friction rub.  No murmur heard.  Pulmonary/Chest: Effort normal and breath sounds normal. No respiratory distress. He has no wheezes.  Abdominal: Soft. Bowel sounds are normal. He exhibits no distension. There is no tenderness.  Lymphadenopathy:  He has no cervical adenopathy.    Neurological: He is alert and oriented to person, place, and time.  Skin: Skin is warm and dry. No rash noted. No erythema.  Psychiatric: He has a normal mood and affect. His behavior is normal.     Lab Results Recent Labs    01/06/20 0914  WBC 5.0  HGB 11.8*  HCT 36.5*  NA 138  K 4.2  CL 103  CO2 26  BUN 16  CREATININE 0.81   Lab Results  Component Value Date   ESRSEDRATE 35 (H) 12/21/2019   Lab Results  Component Value Date   CRP 2.8 (H) 12/21/2019    Microbiology: 2/23 or culture ngtd 2/20 1 of 4 blood cx mrse Studies/Results: No results found.   Assessment/Plan: Epidural abscess, thought to be dental related s/p debridement and stabilization = continue on vancomycin plus ceftriaxone with oral rifampin. Currently day 20 of 42. Due to hx of substance use that was recent, will keep hospitalized for duration of abtx course. Can consider to see if place in SNF in the last 10-14 days of care  Needs twice a week bmp and weekly liver function and weekly cbc  Chronic hep c without hepatic coma = recommend to follow up as outpatient to consider treatment  Will follow up weekly while he is hospitalized  [facility] Cell: [phone] Pager: [phone]  01/08/2020, 2:46 PM

## 2020-01-09 LAB — BASIC METABOLIC PANEL
Anion gap: 11 (ref 5–15)
BUN: 16 mg/dL (ref 6–20)
CO2: 27 mmol/L (ref 22–32)
Calcium: 9.4 mg/dL (ref 8.9–10.3)
Chloride: 102 mmol/L (ref 98–111)
Creatinine, Ser: 0.87 mg/dL (ref 0.61–1.24)
GFR calc Af Amer: >60 mL/min (ref >60)
GFR calc non Af Amer: >60 mL/min (ref >60)
Glucose, Bld: 104 mg/dL — ABNORMAL HIGH (ref 70–99)
Potassium: 3.7 mmol/L (ref 3.5–5.1)
Sodium: 140 mmol/L (ref 135–145)

## 2020-01-09 NOTE — Progress Notes (Signed)
PROGRESS NOTE    Michael Lester  [id number]:[id number] DOB: 05-31-1986 DOA: 12/19/2019 PCP: Patient, [name]    Brief Narrative:  Patient was admitted to the hospital(from [place]) with theworking diagnosis of C6-C7, discitis osteomyelitis.  34 year old male who presented to the hospital with a chief complaint of neck pain. He does have significant past medical history for IV heroin abuse, he was found unresponsive apparently had a headache and neck pain. He reported 1 to 2 weeks of neck pain, at the base of the neck radiated to the right shoulder, associated with numbness involving the right second and third fingers. He had recurrent episodes of syncope. He acknowledge ongoing daily IV heroin use. On his initial physical examination blood pressure 123/83, heart rate 60, respiratory rate 10, temperature 98.3, oxygen saturation97%,his lungs are clear to auscultation bilaterally, heart S1-S2 present rhythmic, soft abdomen, no lower extremity edema, neurologically patient was nonfocal. Cervical MRI showed discitis osteomyelitis C6-C7,mild retrolisthesis at the level of the prevertebral fluid and edema, and phlegmon in the ventral epidural space resulting in spinal stenosis and spinal cord mass-effect.  Patient was placed on broad-spectrum antibiotics and underwent anterior cervical discectomy with interbody fusion by Dr. Hillery Jacks December 22, 2019.  Currently on IV vancomycin, ceftriaxone and rifampin,plan to complete 6 weeks of therapy(started2/23/2021complete 02/02/20).Plan to complete as inpatient due to history of IV drug abuse.  Patient has been tolerating antibiotic therapy well.    Assessment & Plan:   Principal Problem:   Osteomyelitis of C6-7 Active Problems:   Injection of illicit drug within last 12 months   Syncope   Abscess in epidural space of cervical spine   Opioid dependence, daily use (HCC)   Positive hepatitis C antibody test   Severe sepsis (HCC)   1.  C6-C7 osteomyelitis and discitis. Patient tolerating wellIV antibiotic therapy, with vancomycin and ceftriaxone. Plan for dc antibiotic therapy on 02/02/20.   Will follow twice per week BMP (Monday and Friday) and once per week LFT and CBC  (Monday).   2. Chronic anemiaMultifactorial.   3. Substance abuse. No signs clinicalsigns of withdrawal.Out of bed as tolerated.   DVT prophylaxis:enoxaparin Code Status:full Family Communication:no family at the bedside Disposition Plan/ discharge barriers:patient from home, barrier for dc is continuous IV antibiotic therapy, that can't be given as outpatient due to hx of substance abuse. Will complete therapy in the hospital.  Consultants:   ID   Neurosurgery   Procedures:  discectomy and fusion cervical spine  Antimicrobials:  Vancomycin and ceftriaxone to complete on 02/02/20.  Subjective: Patient is feeling well, no nausea or vomiting, no chest pain or dyspnea, he has been out of bed and ambulating.   Objective: Vitals:   01/08/20 2008 01/08/20 2342 01/09/20 0403 01/09/20 0826  BP: 127/87 115/70 113/71   Pulse: (!) 101  80   Resp: 18 18 18    Temp: 97.7 F (36.5 C) 97.7 F (36.5 C) 97.8 F (36.6 C) 97.9 F (36.6 C)  TempSrc: Oral Oral Oral Oral  SpO2: 98%  98%   Weight:      Height:        Intake/Output Summary (Last 24 hours) at 01/09/2020 0929 Last data filed at 01/08/2020 2019 Gross per 24 hour  Intake 490 ml  Output --  Net 490 ml   Filed Weights   12/20/19 0135  Weight: 71.5 kg    Examination:   General: Not in pain or dyspnea.  Neurology: Awake and alert, non focal  E ENT: no  pallor, no icterus, oral mucosa moist Cardiovascular: No JVD. S1-S2 present, rhythmic, no gallops, rubs, or murmurs. No lower extremity edema. Pulmonary: vesicular breath sounds bilaterally, adequate air movement, no wheezing, rhonchi or rales. Gastrointestinal. Abdomen with no organomegaly, non tender, no  rebound or guarding Skin. No rashes Musculoskeletal: no joint deformities     Data Reviewed: I have personally reviewed following labs and imaging studies  CBC: Recent Labs  Lab 01/05/20 0500 01/06/20 0914  WBC 5.3 5.0  HGB 11.9* 11.8*  HCT 36.7* 36.5*  MCV 81.4 81.7  PLT 201 188   Basic Metabolic Panel: Recent Labs  Lab 01/03/20 0130 01/05/20 0500 01/06/20 0914 01/09/20 0319  NA 140 137 138 140  K 3.7 3.9 4.2 3.7  CL 103 100 103 102  CO2 27 26 26 27   GLUCOSE 101* 99 94 104*  BUN 24* 16 16 16   CREATININE 1.00 0.85 0.81 0.87  CALCIUM 9.0 9.3 9.3 9.4  MG  --  1.8 1.9  --    GFR: Estimated Creatinine Clearance: 119.9 mL/min (by C-G formula based on SCr of 0.87 mg/dL). Liver Function Tests: No results for input(s): AST, ALT, ALKPHOS, BILITOT, PROT, ALBUMIN in the last 168 hours. No results for input(s): LIPASE, AMYLASE in the last 168 hours. No results for input(s): AMMONIA in the last 168 hours. Coagulation Profile: No results for input(s): INR, PROTIME in the last 168 hours. Cardiac Enzymes: No results for input(s): CKTOTAL, CKMB, CKMBINDEX, TROPONINI in the last 168 hours. BNP (last 3 results) No results for input(s): PROBNP in the last 8760 hours. HbA1C: No results for input(s): HGBA1C in the last 72 hours. CBG: No results for input(s): GLUCAP in the last 168 hours. Lipid Profile: No results for input(s): CHOL, HDL, LDLCALC, TRIG, CHOLHDL, LDLDIRECT in the last 72 hours. Thyroid Function Tests: No results for input(s): TSH, T4TOTAL, FREET4, T3FREE, THYROIDAB in the last 72 hours. Anemia Panel: No results for input(s): VITAMINB12, FOLATE, FERRITIN, TIBC, IRON, RETICCTPCT in the last 72 hours.    Radiology Studies: I have reviewed all of the imaging during this hospital visit personally     Scheduled Meds: . Chlorhexidine Gluconate Cloth  6 each Topical Daily  . gabapentin  100 mg Oral TID  . miconazole nitrate   Topical BID  . nicotine  14 mg  Transdermal Daily  . rifampin  300 mg Oral BID WC  . sodium chloride flush  10-40 mL Intracatheter Q12H   Continuous Infusions: . cefTRIAXone (ROCEPHIN)  IV 2 g (01/08/20 2339)  . lactated ringers 200 mL/hr at 12/22/19 2004  . vancomycin 1,000 mg (01/09/20 0305)     LOS: 21 days        [name] 2005, MD

## 2020-01-09 NOTE — Plan of Care (Signed)

## 2020-01-10 LAB — VANCOMYCIN, PEAK: Vancomycin Pk: 38 ug/mL (ref 30–40)

## 2020-01-10 NOTE — Progress Notes (Signed)
PROGRESS NOTE    Michael Lester  IOE:[id number] DOB: July 25, 1986 DOA: 12/19/2019 PCP: Patient, [name]    Brief Narrative:  Patient was admitted to the hospital(from [place]) with theworking diagnosis of C6-C7, discitis osteomyelitis.  34 year old male who presented to the hospital with a chief complaint of neck pain. He does have significant past medical history for IV heroin abuse, he was found unresponsive apparently had a headache and neck pain. He reported 1 to 2 weeks of neck pain, at the base of the neck radiated to the right shoulder, associated with numbness involving the right second and third fingers. He had recurrent episodes of syncope. He acknowledge ongoing daily IV heroin use. On his initial physical examination blood pressure 123/83, heart rate 60, respiratory rate 10, temperature 98.3, oxygen saturation97%,his lungs are clear to auscultation bilaterally, heart S1-S2 present rhythmic, soft abdomen, no lower extremity edema, neurologically patient was nonfocal. Cervical MRI showed discitis osteomyelitis C6-C7,mild retrolisthesis at the level of the prevertebral fluid and edema, and phlegmon in the ventral epidural space resulting in spinal stenosis and spinal cord mass-effect.  Patient was placed on broad-spectrum antibiotics and underwent anterior cervical discectomy with interbody fusion by Dr. Hillery Jacks December 22, 2019.  Currently on IV vancomycin, ceftriaxone and rifampin,plan to complete 6 weeks of therapy(started2/23/2021complete 02/02/20).Plan to complete as inpatient due to history of IV drug abuse.  Patient has been tolerating antibiotic therapy well.  Plan to  follow twice per week BMP (Monday and Friday) and once per week LFT and CBC  (Monday).    Assessment & Plan:   Principal Problem:   Osteomyelitis of C6-7 Active Problems:   Injection of illicit drug within last 12 months   Syncope   Abscess in epidural space of cervical spine  Opioid dependence, daily use (HCC)   Positive hepatitis C antibody test   Severe sepsis (HCC)   1. C6-C7 osteomyelitis and discitis.OnIVantibiotic therapy with good toleration. Continue with vancomycin and ceftriaxone. Plan for dcantibiotic therapy on 02/02/20.   Will check BMP, CBC and liver panel in am, as scheduled.   2. Chronic anemiaMultifactorial. Stable will follow on cell count in am.   3. Substance abuse. No signs of withdrawal.Continue out of bed as tolerated.   DVT prophylaxis:enoxaparin Code Status:full Family Communication:no family at the bedside Disposition Plan/ discharge barriers:patient from home, barrier for dc is continuous IV antibiotic therapy, that can't be given as outpatient due to hx of substance abuse. Will complete therapy in the hospital.  Consultants:  ID   Neurosurgery  Procedures:  discectomy and fusion cervical spine  Antimicrobials:  Vancomycin and ceftriaxone to complete on 02/02/20.  Subjective: Patient is feeling well today, no new complains.   Objective: Vitals:   01/09/20 1623 01/09/20 1934 01/09/20 2341 01/10/20 0755  BP: 118/71 118/89 111/80 111/75  Pulse: 84 (!) 110 80 77  Resp: 16 17 17 20   Temp: 98.4 F (36.9 C) 98.4 F (36.9 C) 98.1 F (36.7 C) (!) 97 F (36.1 C)  TempSrc: Oral Oral Oral   SpO2: 96% 98% 99% 97%  Weight:      Height:        Intake/Output Summary (Last 24 hours) at 01/10/2020 1050 Last data filed at 01/09/2020 2032 Gross per 24 hour  Intake 10 ml  Output --  Net 10 ml   Filed Weights   12/20/19 0135  Weight: 71.5 kg    Examination:   General: Not in pain or dyspnea.  Neurology: Awake and alert, non  focal  E ENT: no pallor, no icterus, oral mucosa moist Cardiovascular: No JVD. S1-S2 present, rhythmic, no gallops, rubs, or murmurs. No lower extremity edema. Pulmonary: vesicular breath sounds bilaterally, adequate air movement, no wheezing, rhonchi or  rales. Gastrointestinal. Abdomen with no organomegaly, non tender, no rebound or guarding Skin. No rashes Musculoskeletal: no joint deformities     Data Reviewed: I have personally reviewed following labs and imaging studies  CBC: Recent Labs  Lab 01/05/20 0500 01/06/20 0914  WBC 5.3 5.0  HGB 11.9* 11.8*  HCT 36.7* 36.5*  MCV 81.4 81.7  PLT 201 188   Basic Metabolic Panel: Recent Labs  Lab 01/05/20 0500 01/06/20 0914 01/09/20 0319  NA 137 138 140  K 3.9 4.2 3.7  CL 100 103 102  CO2 26 26 27   GLUCOSE 99 94 104*  BUN 16 16 16   CREATININE 0.85 0.81 0.87  CALCIUM 9.3 9.3 9.4  MG 1.8 1.9  --    GFR: Estimated Creatinine Clearance: 119.9 mL/min (by C-G formula based on SCr of 0.87 mg/dL). Liver Function Tests: No results for input(s): AST, ALT, ALKPHOS, BILITOT, PROT, ALBUMIN in the last 168 hours. No results for input(s): LIPASE, AMYLASE in the last 168 hours. No results for input(s): AMMONIA in the last 168 hours. Coagulation Profile: No results for input(s): INR, PROTIME in the last 168 hours. Cardiac Enzymes: No results for input(s): CKTOTAL, CKMB, CKMBINDEX, TROPONINI in the last 168 hours. BNP (last 3 results) No results for input(s): PROBNP in the last 8760 hours. HbA1C: No results for input(s): HGBA1C in the last 72 hours. CBG: No results for input(s): GLUCAP in the last 168 hours. Lipid Profile: No results for input(s): CHOL, HDL, LDLCALC, TRIG, CHOLHDL, LDLDIRECT in the last 72 hours. Thyroid Function Tests: No results for input(s): TSH, T4TOTAL, FREET4, T3FREE, THYROIDAB in the last 72 hours. Anemia Panel: No results for input(s): VITAMINB12, FOLATE, FERRITIN, TIBC, IRON, RETICCTPCT in the last 72 hours.    Radiology Studies: I have reviewed all of the imaging during this hospital visit personally     Scheduled Meds: . Chlorhexidine Gluconate Cloth  6 each Topical Daily  . gabapentin  100 mg Oral TID  . miconazole nitrate   Topical BID  .  nicotine  14 mg Transdermal Daily  . rifampin  300 mg Oral BID WC  . sodium chloride flush  10-40 mL Intracatheter Q12H   Continuous Infusions: . cefTRIAXone (ROCEPHIN)  IV 2 g (01/09/20 2347)  . lactated ringers 200 mL/hr at 12/22/19 2004  . vancomycin 1,000 mg (01/10/20 0324)     LOS: 22 days        Kalayna Noy 2005, MD

## 2020-01-11 LAB — CBC WITH DIFFERENTIAL/PLATELET
Abs Immature Granulocytes: 0.03 10*3/uL (ref 0.00–0.07)
Basophils Absolute: 0.1 10*3/uL (ref 0.0–0.1)
Basophils Relative: 2 %
Eosinophils Absolute: 0.3 10*3/uL (ref 0.0–0.5)
Eosinophils Relative: 7 %
HCT: 37.3 % — ABNORMAL LOW (ref 39.0–52.0)
Hemoglobin: 12 g/dL — ABNORMAL LOW (ref 13.0–17.0)
Immature Granulocytes: 1 %
Lymphocytes Relative: 40 %
Lymphs Abs: 1.7 10*3/uL (ref 0.7–4.0)
MCH: 26.3 pg (ref 26.0–34.0)
MCHC: 32.2 g/dL (ref 30.0–36.0)
MCV: 81.8 fL (ref 80.0–100.0)
Monocytes Absolute: 0.4 10*3/uL (ref 0.1–1.0)
Monocytes Relative: 10 %
Neutro Abs: 1.7 10*3/uL (ref 1.7–7.7)
Neutrophils Relative %: 40 %
Platelets: 176 10*3/uL (ref 150–400)
RBC: 4.56 MIL/uL (ref 4.22–5.81)
RDW: 14.8 % (ref 11.5–15.5)
WBC: 4.2 10*3/uL (ref 4.0–10.5)
nRBC: 0 % (ref 0.0–0.2)

## 2020-01-11 LAB — BASIC METABOLIC PANEL
Anion gap: 9 (ref 5–15)
BUN: 19 mg/dL (ref 6–20)
CO2: 26 mmol/L (ref 22–32)
Calcium: 9.1 mg/dL (ref 8.9–10.3)
Chloride: 102 mmol/L (ref 98–111)
Creatinine, Ser: 1.02 mg/dL (ref 0.61–1.24)
GFR calc Af Amer: >60 mL/min (ref >60)
GFR calc non Af Amer: >60 mL/min (ref >60)
Glucose, Bld: 117 mg/dL — ABNORMAL HIGH (ref 70–99)
Potassium: 3.8 mmol/L (ref 3.5–5.1)
Sodium: 137 mmol/L (ref 135–145)

## 2020-01-11 LAB — HEPATIC FUNCTION PANEL
ALT: 17 U/L (ref 0–44)
AST: 14 U/L — ABNORMAL LOW (ref 15–41)
Albumin: 3.4 g/dL — ABNORMAL LOW (ref 3.5–5.0)
Alkaline Phosphatase: 102 U/L (ref 38–126)
Bilirubin, Direct: <0.1 mg/dL (ref 0.0–0.2)
Total Bilirubin: 0.5 mg/dL (ref 0.3–1.2)
Total Protein: 6.6 g/dL (ref 6.5–8.1)

## 2020-01-11 LAB — VANCOMYCIN, TROUGH: Vancomycin Tr: 14 ug/mL — ABNORMAL LOW (ref 15–20)

## 2020-01-11 MED ORDER — VANCOMYCIN HCL 750 MG/150ML IV SOLN
750.0000 mg | Freq: Two times a day (BID) | INTRAVENOUS | Status: DC
  Administered 2020-01-11 – 2020-01-13 (×4): 750 mg via INTRAVENOUS
  Filled 2020-01-11 (×5): qty 150

## 2020-01-11 NOTE — Plan of Care (Signed)

## 2020-01-11 NOTE — Progress Notes (Signed)
PROGRESS NOTE    Michael Lester  ZSW:[id number] DOB: 10-05-1986 DOA: 12/19/2019 PCP: Patient, [name]    Brief Narrative:  Patient was admitted to the hospital(from [place]) with theworking diagnosis of C6-C7, discitis osteomyelitis.  34 year old male who presented to the hospital with a chief complaint of neck pain. He does have significant past medical history for IV heroin abuse, he was found unresponsive apparently had a headache and neck pain. He reported 1 to 2 weeks of neck pain, at the base of the neck radiated to the right shoulder, associated with numbness involving the right second and third fingers. He had recurrent episodes of syncope. He acknowledge ongoing daily IV heroin use. On his initial physical examination blood pressure 123/83, heart rate 60, respiratory rate 10, temperature 98.3, oxygen saturation97%,his lungs are clear to auscultation bilaterally, heart S1-S2 present rhythmic, soft abdomen, no lower extremity edema, neurologically patient was nonfocal. Cervical MRI showed discitis osteomyelitis C6-C7,mild retrolisthesis at the level of the prevertebral fluid and edema, and phlegmon in the ventral epidural space resulting in spinal stenosis and spinal cord mass-effect.  Patient was placed on broad-spectrum antibiotics and underwent anterior cervical discectomy with interbody fusion by Dr. Hillery Jacks December 22, 2019.  Currently on IV vancomycin, ceftriaxone and rifampin,plan to complete 6 weeks of therapy(started2/23/2021complete 02/02/20).Plan to complete as inpatient due to history of IV drug abuse.  Patient has been tolerating antibiotic therapy well.  Plan to  follow twice per week BMP (Monday and Friday) and once per week LFT and CBC (Monday).     Assessment & Plan:   Principal Problem:   Osteomyelitis of C6-7 Active Problems:   Injection of illicit drug within last 12 months   Syncope   Abscess in epidural space of cervical spine  Opioid dependence, daily use (HCC)   Positive hepatitis C antibody test   Severe sepsis (HCC)   1. C6-C7 osteomyelitis and discitis.Continue withIVantibiotic therapy with good toleration. Cell count is stable, along with renal and hepatic profile. Will continue vancomycin and ceftriaxone. Plan continue to be dcantibiotic therapyon 02/02/20.  2. Chronic anemiaMultifactorial. Cell count is stable.    3. Substance abuse.Noacute signs of withdrawal.Continue out of bed as tolerated.  DVT prophylaxis:enoxaparin Code Status:full Family Communication:no family at the bedside Disposition Plan/ discharge barriers:patient from home, barrier for dc is continuous IV antibiotic therapy, that can't be given as outpatient due to hx of substance abuse. Will complete therapy in the hospital.   Subjective: Patient is feeling well, no neck pain, he has been out of bed and tolerating po well.   Objective: Vitals:   01/10/20 2008 01/11/20 0010 01/11/20 0350 01/11/20 0800  BP: 122/82 110/71 111/67 113/74  Pulse: 86 74 75 82  Resp:    20  Temp: 98.2 F (36.8 C) 97.8 F (36.6 C) 97.8 F (36.6 C) 97.8 F (36.6 C)  TempSrc: Oral     SpO2: 97% 98%  96%  Weight:      Height:        Intake/Output Summary (Last 24 hours) at 01/11/2020 0935 Last data filed at 01/11/2020 0700 Gross per 24 hour  Intake 610 ml  Output 300 ml  Net 310 ml   Filed Weights   12/20/19 0135  Weight: 71.5 kg    Examination:   General: Not in pain or dyspnea.  Neurology: Awake and alert, non focal  E ENT: no pallor, no icterus, oral mucosa moist Cardiovascular: No JVD. S1-S2 present, rhythmic, no gallops, rubs, or murmurs. No  lower extremity edema. Pulmonary: vesicular breath sounds bilaterally, adequate air movement, no wheezing, rhonchi or rales. Gastrointestinal. Abdomen flat, no organomegaly, non tender, no rebound or guarding Skin. No rashes Musculoskeletal: no joint deformities      Data Reviewed: I have personally reviewed following labs and imaging studies  CBC: Recent Labs  Lab 01/05/20 0500 01/06/20 0914 01/11/20 0123  WBC 5.3 5.0 4.2  NEUTROABS  --   --  1.7  HGB 11.9* 11.8* 12.0*  HCT 36.7* 36.5* 37.3*  MCV 81.4 81.7 81.8  PLT 201 188 176   Basic Metabolic Panel: Recent Labs  Lab 01/05/20 0500 01/06/20 0914 01/09/20 0319 01/11/20 0123  NA 137 138 140 137  K 3.9 4.2 3.7 3.8  CL 100 103 102 102  CO2 26 26 27 26   GLUCOSE 99 94 104* 117*  BUN 16 16 16 19   CREATININE 0.85 0.81 0.87 1.02  CALCIUM 9.3 9.3 9.4 9.1  MG 1.8 1.9  --   --    GFR: Estimated Creatinine Clearance: 102.2 mL/min (by C-G formula based on SCr of 1.02 mg/dL). Liver Function Tests: Recent Labs  Lab 01/11/20 0123  AST 14*  ALT 17  ALKPHOS 102  BILITOT 0.5  PROT 6.6  ALBUMIN 3.4*   No results for input(s): LIPASE, AMYLASE in the last 168 hours. No results for input(s): AMMONIA in the last 168 hours. Coagulation Profile: No results for input(s): INR, PROTIME in the last 168 hours. Cardiac Enzymes: No results for input(s): CKTOTAL, CKMB, CKMBINDEX, TROPONINI in the last 168 hours. BNP (last 3 results) No results for input(s): PROBNP in the last 8760 hours. HbA1C: No results for input(s): HGBA1C in the last 72 hours. CBG: No results for input(s): GLUCAP in the last 168 hours. Lipid Profile: No results for input(s): CHOL, HDL, LDLCALC, TRIG, CHOLHDL, LDLDIRECT in the last 72 hours. Thyroid Function Tests: No results for input(s): TSH, T4TOTAL, FREET4, T3FREE, THYROIDAB in the last 72 hours. Anemia Panel: No results for input(s): VITAMINB12, FOLATE, FERRITIN, TIBC, IRON, RETICCTPCT in the last 72 hours.    Radiology Studies: I have reviewed all of the imaging during this hospital visit personally     Scheduled Meds: . Chlorhexidine Gluconate Cloth  6 each Topical Daily  . gabapentin  100 mg Oral TID  . miconazole nitrate   Topical BID  . nicotine  14 mg  Transdermal Daily  . rifampin  300 mg Oral BID WC  . sodium chloride flush  10-40 mL Intracatheter Q12H   Continuous Infusions: . cefTRIAXone (ROCEPHIN)  IV 2 g (01/11/20 0019)  . lactated ringers 200 mL/hr at 12/22/19 2004  . vancomycin       LOS: 23 days        Darolyn Double 12/24/19, MD

## 2020-01-11 NOTE — Progress Notes (Signed)
Pharmacy Antibiotic Note  Michael Lester is a 34 y.o. male admitted on 12/19/2019 with osteomyelitis of the cervical spine.  Pharmacy has been consulted for vancomycin dosing.  3/15 AM update:  AUC is supra-therapeutic at ~600 Renal function appears stable  Plan: Dec vancomycin to 750 mg IV q12h >>New estimated AUC: 450 Repeat levels at steady state  Temp (24hrs), Avg:97.8 F (36.6 C), Min:97 F (36.1 C), Max:98.2 F (36.8 C)  Recent Labs  Lab 01/05/20 0500 01/06/20 0914 01/09/20 0319 01/10/20 1628 01/11/20 0123  WBC 5.3 5.0  --   --  4.2  CREATININE 0.85 0.81 0.87  --   --   VANCOTROUGH  --   --   --   --  14*  VANCOPEAK  --   --   --  38  --     Estimated Creatinine Clearance: 119.9 mL/min (by C-G formula based on SCr of 0.87 mg/dL).    Allergies  Allergen Reactions  . Shellfish Allergy Anaphylaxis   Abran Duke, PharmD, BCPS Clinical Pharmacist Phone: [phone]

## 2020-01-12 NOTE — Progress Notes (Signed)
PROGRESS NOTE    DEKLAN MINAR  OEU:[id number] DOB: 12-22-1985 DOA: 12/19/2019 PCP: Patient, [name]    Brief Narrative:  Patient was admitted to the hospital(from [facility]) with theworking diagnosis of C6-C7, discitis osteomyelitis.  34 year old male who presented to the hospital with a chief complaint of neck pain. He does have significant past medical history for IV heroin abuse, he was found unresponsive apparently had a headache and neck pain. He reported 1 to 2 weeks of neck pain, at the base of the neck radiated to the right shoulder, associated with numbness involving the right second and third fingers. He had recurrent episodes of syncope. He acknowledge ongoing daily IV heroin use. On his initial physical examination blood pressure 123/83, heart rate 60, respiratory rate 10, temperature 98.3, oxygen saturation97%,his lungs are clear to auscultation bilaterally, heart S1-S2 present rhythmic, soft abdomen, no lower extremity edema, neurologically patient was nonfocal. Cervical MRI showed discitis osteomyelitis C6-C7,mild retrolisthesis at the level of the prevertebral fluid and edema, and phlegmon in the ventral epidural space resulting in spinal stenosis and spinal cord mass-effect.  Patient was placed on broad-spectrum antibiotics and underwent anterior cervical discectomy with interbody fusion by Dr. Toni Arthurs December 22, 2019.  Currently on IV vancomycin, ceftriaxone and rifampin,plan to complete 6 weeks of therapy(started2/23/2021complete 02/02/20).Plan to complete as inpatient due to history of IV drug abuse.  Patient has been tolerating antibiotic therapy well.  Plan tofollow twice per week BMP (Monday and Friday) and once per week LFT and CBC (Monday).    Assessment & Plan:   Principal Problem:   Osteomyelitis of C6-7 Active Problems:   Injection of illicit drug within last 12 months   Syncope   Abscess in epidural space of cervical spine  Opioid dependence, daily use (HCC)   Positive hepatitis C antibody test   Severe sepsis (HCC)    1. C6-C7 osteomyelitis and discitis.OnIVantibiotic therapywith good toleration, vancomycin and ceftriaxone. Schedule dcantibiotic therapyon 02/02/20.Will need to complete antibiotic therapy in the hospital.   2. Chronic anemiaMultifactorial. Cell count is stable.   3. Substance abuse.Nosigns of withdrawal.Continue to be out of bed and ambulating.  DVT prophylaxis:enoxaparin Code Status:full Family Communication:no family at the bedside Disposition Plan/ discharge barriers:patient from home, barrier for dc is continuous IV antibiotic therapy, that can't be given as outpatient due to hx of substance abuse. Will complete therapy in the hospital.   Subjective: Patient out of bed to chair, with no new complains, no nausea or vomiting, no dyspnea or chest pain.   Objective: Vitals:   01/11/20 1955 01/11/20 2330 01/12/20 0401 01/12/20 0818  BP: 102/72 107/67 105/67 116/76  Pulse: (!) 104 83 77 76  Resp:      Temp: 98.1 F (36.7 C) (!) 97.5 F (36.4 C) (!) 97.5 F (36.4 C) 98.7 F (37.1 C)  TempSrc:    Oral  SpO2: 98% 97% 95% 98%  Weight:      Height:        Intake/Output Summary (Last 24 hours) at 01/12/2020 1110 Last data filed at 01/11/2020 1700 Gross per 24 hour  Intake 480 ml  Output -  Net 480 ml   Filed Weights   12/20/19 0135  Weight: 71.5 kg    Examination:   General: Not in pain or dyspnea.  Neurology: Awake and alert, non focal  E ENT: no pallor, no icterus, oral mucosa moist Cardiovascular: No JVD. S1-S2 present, rhythmic, no gallops, rubs, or murmurs. No lower extremity edema. Pulmonary: positive breath  sounds bilaterally, adequate air movement, no wheezing, rhonchi or rales. Gastrointestinal. Abdomen with no organomegaly, non tender, no rebound or guarding Skin. No rashes Musculoskeletal: no joint deformities     Data Reviewed:  I have personally reviewed following labs and imaging studies  CBC: Recent Labs  Lab 01/06/20 0914 01/11/20 0123  WBC 5.0 4.2  NEUTROABS  --  1.7  HGB 11.8* 12.0*  HCT 36.5* 37.3*  MCV 81.7 81.8  PLT 188 169   Basic Metabolic Panel: Recent Labs  Lab 01/06/20 0914 01/09/20 0319 01/11/20 0123  NA 138 140 137  K 4.2 3.7 3.8  CL 103 102 102  CO2 26 27 26   GLUCOSE 94 104* 117*  BUN 16 16 19   CREATININE 0.81 0.87 1.02  CALCIUM 9.3 9.4 9.1  MG 1.9  --   --    GFR: Estimated Creatinine Clearance: 102.2 mL/min (by C-G formula based on SCr of 1.02 mg/dL). Liver Function Tests: Recent Labs  Lab 01/11/20 0123  AST 14*  ALT 17  ALKPHOS 102  BILITOT 0.5  PROT 6.6  ALBUMIN 3.4*   No results for input(s): LIPASE, AMYLASE in the last 168 hours. No results for input(s): AMMONIA in the last 168 hours. Coagulation Profile: No results for input(s): INR, PROTIME in the last 168 hours. Cardiac Enzymes: No results for input(s): CKTOTAL, CKMB, CKMBINDEX, TROPONINI in the last 168 hours. BNP (last 3 results) No results for input(s): PROBNP in the last 8760 hours. HbA1C: No results for input(s): HGBA1C in the last 72 hours. CBG: No results for input(s): GLUCAP in the last 168 hours. Lipid Profile: No results for input(s): CHOL, HDL, LDLCALC, TRIG, CHOLHDL, LDLDIRECT in the last 72 hours. Thyroid Function Tests: No results for input(s): TSH, T4TOTAL, FREET4, T3FREE, THYROIDAB in the last 72 hours. Anemia Panel: No results for input(s): VITAMINB12, FOLATE, FERRITIN, TIBC, IRON, RETICCTPCT in the last 72 hours.    Radiology Studies: I have reviewed all of the imaging during this hospital visit personally     Scheduled Meds: . Chlorhexidine Gluconate Cloth  6 each Topical Daily  . gabapentin  100 mg Oral TID  . miconazole nitrate   Topical BID  . nicotine  14 mg Transdermal Daily  . rifampin  300 mg Oral BID WC  . sodium chloride flush  10-40 mL Intracatheter Q12H    Continuous Infusions: . cefTRIAXone (ROCEPHIN)  IV 2 g (01/11/20 2341)  . lactated ringers 200 mL/hr at 12/22/19 2004  . vancomycin 750 mg (01/12/20 0258)     LOS: 24 days        Saliah Crisp Gerome Apley, MD

## 2020-01-12 NOTE — Progress Notes (Addendum)
Occupational Therapy Treatment Patient Details Name: Michael Lester MRN: [id number] DOB: February 09, 1986 Today's Date: 01/12/2020    History of present illness Michael Lester is a 34 y.o. male with medical history significant for IV heroin abuse, now presenting to the emergency department after he was found unresponsive and complaining of headache and neck pain.  Patient reports 1 to 2 weeks of pain near the base of his neck that radiates to the right shoulder and has been associated with numbness involving the right second and third fingers. Pt now s/p C5-7 ACDF due to osteomyeltis discitis with epidural abscess.   OT comments  Pt progressing towards OT goals, pleasant and willing to participate in session. Pt return demonstrating HEP using level 2 theraband - emphasis on maintaining proper alignment and decreasing compensatory movements during completion with good carryover noted. Continued review/emphasis of cervical precautions within ADL/functional context provided as well as pt admittedly with decreased carryover during certain functional tasks; very receptive to education provided. Will continue per POC at this time.   Follow Up Recommendations  No OT follow up    Equipment Recommendations  None recommended by OT          Precautions / Restrictions Precautions Precautions: Cervical Precaution Booklet Issued: Yes (comment) Precaution Comments: reviewed in context of BADL Required Braces or Orthoses: Cervical Brace Cervical Brace: Soft collar;Other (comment) Restrictions Weight Bearing Restrictions: No Other Position/Activity Restrictions: Cleared by Dr. Annette Stable for gentle UE exercises with theraband.       Mobility Bed Mobility               General bed mobility comments: OOB in recliner  Transfers Overall transfer level: Independent                    Balance Overall balance assessment: No apparent balance deficits (not formally assessed)                                          ADL either performed or assessed with clinical judgement   ADL Overall ADL's : Needs assistance/impaired       Grooming Details (indicate cue type and reason): reviewed compensatory techniques for oral care                             Functional mobility during ADLs: Supervision/safety General ADL Comments: continued review/emphasis on cervical precautions in relation to ADL tasks      Vision   Vision Assessment?: No apparent visual deficits   Perception     Praxis      Cognition Arousal/Alertness: Awake/alert Behavior During Therapy: WFL for tasks assessed/performed Overall Cognitive Status: Within Functional Limits for tasks assessed                                 General Comments: Very agreeable to therapy        Exercises Exercises: General Upper Extremity General Exercises - Upper Extremity Shoulder Flexion: AROM;Right;10 reps;Seated;Theraband(0-90*) Theraband Level (Shoulder Flexion): Level 2 (Red) Shoulder Horizontal ABduction: AROM;Both;10 reps;Seated;Theraband Theraband Level (Shoulder Horizontal Abduction): Level 2 (Red) Shoulder Horizontal ADduction: AROM;Both;10 reps;Seated;Theraband Theraband Level (Shoulder Horizontal Adduction): Level 2 (Red) Elbow Flexion: AROM;Right;10 reps;Seated;Theraband Theraband Level (Elbow Flexion): Level 2 (Red) Elbow Extension: AROM;Right;10 reps;Seated;Theraband Theraband Level (Elbow Extension): Level 2 (  Red) Other Exercises Other Exercises: scapular retract and hold x3 sec, x10 reps    Shoulder Instructions       General Comments      Pertinent Vitals/ Pain       Pain Assessment: Faces Faces Pain Scale: No hurt Pain Intervention(s): Monitored during session  Home Living                                          Prior Functioning/Environment              Frequency  Min 1X/week        Progress Toward Goals  OT  Goals(current goals can now be found in the care plan section)  Progress towards OT goals: Progressing toward goals  Acute Rehab OT Goals Patient Stated Goal: regain RUE strength OT Goal Formulation: With patient Time For Goal Achievement: 01/26/20 Potential to Achieve Goals: Good ADL Goals Pt Will Perform Eating: with modified independence Pt Will Perform Upper Body Bathing: with modified independence Pt Will Perform Upper Body Dressing: with modified independence Pt/caregiver will Perform Home Exercise Program: Increased strength;Right Upper extremity;With theraputty;With theraband;With written HEP provided  Plan Frequency needs to be updated;Discharge plan needs to be updated    Co-evaluation                 AM-PAC OT "6 Clicks" Daily Activity     Outcome Measure   Help from another person eating meals?: None Help from another person taking care of personal grooming?: None Help from another person toileting, which includes using toliet, bedpan, or urinal?: None Help from another person bathing (including washing, rinsing, drying)?: None Help from another person to put on and taking off regular upper body clothing?: None Help from another person to put on and taking off regular lower body clothing?: None 6 Click Score: 24    End of Session    OT Visit Diagnosis: Muscle weakness (generalized) (M62.81);Other symptoms and signs involving the nervous system (R29.898);Pain Pain - Right/Left: Right Pain - part of body: Arm   Activity Tolerance Patient tolerated treatment well   Patient Left in chair;with call bell/phone within reach   Nurse Communication Mobility status        Time: 2440-1027 OT Time Calculation (min): 18 min  Charges: OT General Charges $OT Visit: 1 Visit OT Treatments $Therapeutic Activity: 8-22 mins  Marcy Siren, OT Acute Rehabilitation Services Pager [phone] Office [phone]    Michael Lester 01/12/2020, 5:59  PM

## 2020-01-13 LAB — VANCOMYCIN, TROUGH: Vancomycin Tr: 8 ug/mL — ABNORMAL LOW (ref 15–20)

## 2020-01-13 LAB — VANCOMYCIN, PEAK: Vancomycin Pk: 23 ug/mL — ABNORMAL LOW (ref 30–40)

## 2020-01-13 MED ORDER — VANCOMYCIN HCL IN DEXTROSE 1-5 GM/200ML-% IV SOLN
1000.0000 mg | Freq: Two times a day (BID) | INTRAVENOUS | Status: DC
  Administered 2020-01-13 – 2020-02-02 (×40): 1000 mg via INTRAVENOUS
  Filled 2020-01-13 (×41): qty 200

## 2020-01-13 MED ORDER — VANCOMYCIN HCL IN DEXTROSE 1-5 GM/200ML-% IV SOLN
1000.0000 mg | Freq: Two times a day (BID) | INTRAVENOUS | Status: DC
  Filled 2020-01-13: qty 200

## 2020-01-13 NOTE — Progress Notes (Signed)
PROGRESS NOTE    Michael Lester  OIN:[id number] DOB: [birth year] DOA: 12/19/2019 PCP: Patient, [name]    Brief Narrative:  Patient was admitted to the hospital(from [place]) with theworking diagnosis of C6-C7, discitis osteomyelitis.  34 year old male who presented to the hospital with a chief complaint of neck pain. He does have significant past medical history for IV heroin abuse, he was found unresponsive apparently had a headache and neck pain. He reported 1 to 2 weeks of neck pain, at the base of the neck radiated to the right shoulder, associated with numbness involving the right second and third fingers. He had recurrent episodes of syncope. He acknowledge ongoing daily IV heroin use. On his initial physical examination blood pressure 123/83, heart rate 60, respiratory rate 10, temperature 98.3, oxygen saturation97%,his lungs are clear to auscultation bilaterally, heart S1-S2 present rhythmic, soft abdomen, no lower extremity edema, neurologically patient was nonfocal. Cervical MRI showed discitis osteomyelitis C6-C7,mild retrolisthesis at the level of the prevertebral fluid and edema, and phlegmon in the ventral epidural space resulting in spinal stenosis and spinal cord mass-effect.  Patient was placed on broad-spectrum antibiotics and underwent anterior cervical discectomy with interbody fusion by Dr. Hillery Jacks December 22, 2019.  Currently on IV vancomycin, ceftriaxone and rifampin,plan to complete 6 weeks of therapy(started2/23/2021complete 02/02/20).Plan to complete as inpatient due to history of IV drug abuse.  Patient has been tolerating antibiotic therapy well.  Plan tofollow twice per week BMP (Monday and Friday) and once per week LFT and CBC (Monday).    Assessment & Plan:   Principal Problem:   Osteomyelitis of C6-7 Active Problems:   Injection of illicit drug within last 12 months   Syncope   Abscess in epidural space of cervical spine  Opioid dependence, daily use (HCC)   Positive hepatitis C antibody test   Severe sepsis (HCC)   1. C6-C7 osteomyelitis and discitis.Tolerating wellIVantibiotic therapy vancomycin and ceftriaxone. Continue therapy until 02/02/20.  2. Chronic anemiaMultifactorial.  3. Substance abuse.Noactive signs of withdrawal.Continue to be out of bed and ambulating.  DVT prophylaxis:enoxaparin Code Status:full Family Communication:no family at the bedside Disposition Plan/ discharge barriers:patient from home, barrier for dc is continuous IV antibiotic therapy, that can't be given as outpatient due to hx of substance abuse. Will complete therapy in the hospital.   Subjective: Patient is feeling well, no nausea or vomiting, no diarrhea, no chest pain, out of bed and ambulating.   Objective: Vitals:   01/12/20 2342 01/13/20 0435 01/13/20 0751 01/13/20 1132  BP: 112/77 101/66 103/73 111/71  Pulse: 78 79 83 85  Resp:   18 18  Temp: 98 F (36.7 C) 97.9 F (36.6 C) 98 F (36.7 C) 98.1 F (36.7 C)  TempSrc:   Oral Oral  SpO2: 99% 99% 99% 98%  Weight:      Height:        Intake/Output Summary (Last 24 hours) at 01/13/2020 1507 Last data filed at 01/13/2020 0845 Gross per 24 hour  Intake 540 ml  Output --  Net 540 ml   Filed Weights   12/20/19 0135  Weight: 71.5 kg    Examination:   General: Not in pain or dyspnea,  Neurology: Awake and alert, non focal  E ENT: no pallor, no icterus, oral mucosa moist Cardiovascular: No JVD. S1-S2 present, rhythmic, no gallops, rubs, or murmurs. No lower extremity edema. Pulmonary: vesicular breath sounds bilaterally, adequate air movement, no wheezing, rhonchi or rales. Gastrointestinal. Abdomen with, no organomegaly, non tender, no rebound  or guarding Skin. No rashes Musculoskeletal: no joint deformities     Data Reviewed: I have personally reviewed following labs and imaging studies  CBC: Recent Labs  Lab  01/11/20 0123  WBC 4.2  NEUTROABS 1.7  HGB 12.0*  HCT 37.3*  MCV 81.8  PLT 176   Basic Metabolic Panel: Recent Labs  Lab 01/09/20 0319 01/11/20 0123  NA 140 137  K 3.7 3.8  CL 102 102  CO2 27 26  GLUCOSE 104* 117*  BUN 16 19  CREATININE 0.87 1.02  CALCIUM 9.4 9.1   GFR: Estimated Creatinine Clearance: 102.2 mL/min (by C-G formula based on SCr of 1.02 mg/dL). Liver Function Tests: Recent Labs  Lab 01/11/20 0123  AST 14*  ALT 17  ALKPHOS 102  BILITOT 0.5  PROT 6.6  ALBUMIN 3.4*   No results for input(s): LIPASE, AMYLASE in the last 168 hours. No results for input(s): AMMONIA in the last 168 hours. Coagulation Profile: No results for input(s): INR, PROTIME in the last 168 hours. Cardiac Enzymes: No results for input(s): CKTOTAL, CKMB, CKMBINDEX, TROPONINI in the last 168 hours. BNP (last 3 results) No results for input(s): PROBNP in the last 8760 hours. HbA1C: No results for input(s): HGBA1C in the last 72 hours. CBG: No results for input(s): GLUCAP in the last 168 hours. Lipid Profile: No results for input(s): CHOL, HDL, LDLCALC, TRIG, CHOLHDL, LDLDIRECT in the last 72 hours. Thyroid Function Tests: No results for input(s): TSH, T4TOTAL, FREET4, T3FREE, THYROIDAB in the last 72 hours. Anemia Panel: No results for input(s): VITAMINB12, FOLATE, FERRITIN, TIBC, IRON, RETICCTPCT in the last 72 hours.    Radiology Studies: I have reviewed all of the imaging during this hospital visit personally     Scheduled Meds: . Chlorhexidine Gluconate Cloth  6 each Topical Daily  . gabapentin  100 mg Oral TID  . miconazole nitrate   Topical BID  . nicotine  14 mg Transdermal Daily  . rifampin  300 mg Oral BID WC  . sodium chloride flush  10-40 mL Intracatheter Q12H   Continuous Infusions: . cefTRIAXone (ROCEPHIN)  IV 2 g (01/13/20 0049)  . lactated ringers 200 mL/hr at 12/22/19 2004  . vancomycin       LOS: 25 days        Rodriques Badie Annett Gula,  MD

## 2020-01-13 NOTE — Progress Notes (Addendum)
Pharmacy Antibiotic Note  Michael Lester is a 34 y.o. male admitted on 12/19/2019 with osteomyelitis of the cervical spine. Pharmacy has been consulted for vancomycin dosing.  AUC sub-therapeutic at ~394. Last Scr 1.02 (on 3/15).  Vancomycin peak: 23 Vancomycin trough: 8  Plan: Increase vancomycin to 1000 mg IV q12h  -Calc AUC 525 -Goal AUC 400-550 Tentative stop date for all antibiotics: 02/02/20 Monitor clinical progress, renal function, vanc levels as clinically indicated.   Height: 6\' 1"  (185.4 cm) Weight: 157 lb 10.1 oz (71.5 kg) IBW/kg (Calculated) : 79.9  Temp (24hrs), Avg:98 F (36.7 C), Min:97.9 F (36.6 C), Max:98.1 F (36.7 C)  Recent Labs  Lab 01/09/20 0319 01/10/20 1628 01/11/20 0123 01/13/20 0535 01/13/20 1330  WBC  --   --  4.2  --   --   CREATININE 0.87  --  1.02  --   --   VANCOTROUGH  --   --  14*  --  8*  VANCOPEAK  --  38  --  23*  --     Estimated Creatinine Clearance: 102.2 mL/min (by C-G formula based on SCr of 1.02 mg/dL).    Allergies  Allergen Reactions  . Shellfish Allergy Anaphylaxis    Antimicrobials this admission: Vanc 2/20 >> CTX 2/21 >> Rifampin 2/27 >> Cefepime/Flagyl x1 2/20  Dose adjustments this admission: Vanc 1250 mg q12h (2/26) >> 1g q12h (3/4) >> 750mg  q12h (3/14) >> 1000 mg q12h (3/17) >>   Thank you for allowing pharmacy to be a part of this patient's care.  06-08-1982, PharmD Candidate  01/13/2020 2:41 PM

## 2020-01-14 LAB — BASIC METABOLIC PANEL
Anion gap: 12 (ref 5–15)
BUN: 17 mg/dL (ref 6–20)
CO2: 25 mmol/L (ref 22–32)
Calcium: 9.5 mg/dL (ref 8.9–10.3)
Chloride: 103 mmol/L (ref 98–111)
Creatinine, Ser: 0.91 mg/dL (ref 0.61–1.24)
GFR calc Af Amer: >60 mL/min (ref >60)
GFR calc non Af Amer: >60 mL/min (ref >60)
Glucose, Bld: 87 mg/dL (ref 70–99)
Potassium: 3.8 mmol/L (ref 3.5–5.1)
Sodium: 140 mmol/L (ref 135–145)

## 2020-01-14 NOTE — Progress Notes (Addendum)
Michael Lester  BMW:[id number] DOB: [birth year] DOA: 12/19/2019 PCP: Patient, [name]    Brief Narrative:   Patient was admitted to the hospital(from [facility]) with theworking diagnosis of C6-C7, discitis osteomyelitis.Now sp anterior cervical discectomy with interbody fusion 12/22/19. Plan to complete antibiotic therapy in the hospital.    34 year old male who presented to the hospital with a chief complaint of neck pain. He does have significant past medical history for IV heroin abuse, he was found unresponsive apparently had a headache and neck pain. He reported 1 to 2 weeks of neck pain, at the base of the neck radiated to the right shoulder, associated with numbness involving the right second and third fingers. He had recurrent episodes of syncope. He acknowledge ongoing daily IV heroin use. On his initial physical examination blood pressure 123/83, heart rate 60, respiratory rate 10, temperature 98.3, oxygen saturation97%,his lungs are clear to auscultation bilaterally, heart S1-S2 present rhythmic, soft abdomen, no lower extremity edema, neurologically patient was nonfocal. Cervical MRI showed discitis osteomyelitis C6-C7,mild retrolisthesis at the level of the prevertebral fluid and edema, and phlegmon in the ventral epidural space resulting in spinal stenosis and spinal cord mass-effect.  Patient was placed on broad-spectrum antibiotics and underwent anterior cervical discectomy with interbody fusion by Dr. Toni Arthurs December 22, 2019.  Currently on IV vancomycin, ceftriaxone and rifampin,plan to complete 6 weeks of therapy(started2/23/2021complete 02/02/20).Plan to complete as inpatient due to history of IV drug abuse.  Patient has been tolerating antibiotic therapy well.  Plan tofollow twice per week BMP (Monday and Friday) and once per week LFT and CBC (Monday).  Assessment & Plan:   Principal Problem:   Osteomyelitis of C6-7 Active  Problems:   Injection of illicit drug within last 12 months   Syncope   Abscess in epidural space of cervical spine   Opioid dependence, daily use (HCC)   Positive hepatitis C antibody test   Severe sepsis (HCC)   1. C6-C7 osteomyelitis and discitis.Continue withIVantibiotic therapy: vancomycin and ceftriaxone/ po rifampin.  Continue therapy until 02/02/20.  Continue pain control with gabapentin, hydrocodone  2. Chronic anemiaMultifactorial. Has been stable.   3. Substance abuse.Nosigns of active withdrawal.Patient has been  out of bed and ambulating.  DVT prophylaxis:enoxaparin Code Status:full Family Communication:no family at the bedside Disposition Plan/ discharge barriers:patient from home, barrier for dc is continuous IV antibiotic therapy, that can't be given as outpatient due to hx of substance abuse. Will complete therapy in the hospital   Subjective: Patient with mild erythema at the site of the dressing, positive pruritus, no pain or local edema.    Objective: Vitals:   01/13/20 1628 01/13/20 2031 01/14/20 0500 01/14/20 0820  BP: 116/76 108/72 98/62 110/71  Pulse: 79 96 73 79  Resp: 18  16   Temp: 98.2 F (36.8 C) 97.7 F (36.5 C) 98.2 F (36.8 C) 97.9 F (36.6 C)  TempSrc:  Oral Oral Oral  SpO2: 100% 100% 100% 100%  Weight:      Height:        Intake/Output Summary (Last 24 hours) at 01/14/2020 0926 Last data filed at 01/14/2020 0514 Gross per 24 hour  Intake 2380 ml  Output -  Net 2380 ml   Filed Weights   12/20/19 0135  Weight: 71.5 kg    Examination:   General: Not in pain or dyspnea  Neurology: Awake and alert, non focal  E ENT: no pallor, no icterus, oral mucosa moist Cardiovascular: No JVD.  S1-S2 present, rhythmic, no gallops, rubs, or murmurs. No lower extremity edema. Pulmonary: vesicular breath sounds bilaterally, adequate air movement, no wheezing, rhonchi or rales. Gastrointestinal. Abdomen with no  organomegaly, non tender, no rebound or guarding Skin. Mild erythema at the site of the dressing, no edema or local tenderness.  Musculoskeletal: no joint deformities     Data Reviewed: I have personally reviewed following labs and imaging studies  CBC: Recent Labs  Lab 01/11/20 0123  WBC 4.2  NEUTROABS 1.7  HGB 12.0*  HCT 37.3*  MCV 81.8  PLT 128   Basic Metabolic Panel: Recent Labs  Lab 01/09/20 0319 01/11/20 0123 01/14/20 0500  NA 140 137 140  K 3.7 3.8 3.8  CL 102 102 103  CO2 27 26 25   GLUCOSE 104* 117* 87  BUN 16 19 17   CREATININE 0.87 1.02 0.91  CALCIUM 9.4 9.1 9.5   GFR: Estimated Creatinine Clearance: 114.6 mL/min (by C-G formula based on SCr of 0.91 mg/dL). Liver Function Tests: Recent Labs  Lab 01/11/20 0123  AST 14*  ALT 17  ALKPHOS 102  BILITOT 0.5  PROT 6.6  ALBUMIN 3.4*   No results for input(s): LIPASE, AMYLASE in the last 168 hours. No results for input(s): AMMONIA in the last 168 hours. Coagulation Profile: No results for input(s): INR, PROTIME in the last 168 hours. Cardiac Enzymes: No results for input(s): CKTOTAL, CKMB, CKMBINDEX, TROPONINI in the last 168 hours. BNP (last 3 results) No results for input(s): PROBNP in the last 8760 hours. HbA1C: No results for input(s): HGBA1C in the last 72 hours. CBG: No results for input(s): GLUCAP in the last 168 hours. Lipid Profile: No results for input(s): CHOL, HDL, LDLCALC, TRIG, CHOLHDL, LDLDIRECT in the last 72 hours. Thyroid Function Tests: No results for input(s): TSH, T4TOTAL, FREET4, T3FREE, THYROIDAB in the last 72 hours. Anemia Panel: No results for input(s): VITAMINB12, FOLATE, FERRITIN, TIBC, IRON, RETICCTPCT in the last 72 hours.    Radiology Studies: I have reviewed all of the imaging during this hospital visit personally     Scheduled Meds: . Chlorhexidine Gluconate Cloth  6 each Topical Daily  . gabapentin  100 mg Oral TID  . miconazole nitrate   Topical BID  .  nicotine  14 mg Transdermal Daily  . rifampin  300 mg Oral BID WC  . sodium chloride flush  10-40 mL Intracatheter Q12H   Continuous Infusions: . cefTRIAXone (ROCEPHIN)  IV 2 g (01/13/20 2330)  . lactated ringers 200 mL/hr at 12/22/19 2004  . vancomycin 1,000 mg (01/14/20 0514)     LOS: 26 days        Jhaniya Briski Gerome Apley, MD

## 2020-01-15 NOTE — Progress Notes (Signed)
PROGRESS NOTE    Michael Lester  QQP:[id number] DOB: 1986/07/14 DOA: 12/19/2019 PCP: Patient, [name]     Brief Narrative:  Michael Lester is an 34 y.o. WM PMHx IVDU   Presents to [facility] after found unresponsive complaining of headache, neck pain right shoulder pain, he was found to be septic with tachycardia and leukocytosis CT of the C-spine show some lucency in C6-C7 MRI of the spine showed discitis and osteomyelitis of C6-C7 and paravertebral edema resulting in phlegmon with with spinal stenosis and cord mass-effect.    Subjective: 3/19 A/O x4, negative S OB, negative DOE.  Positive C-spine pain when rotating head.  States yesterday bandages got wet during showering, now itching.     Assessment & Plan:   Principal Problem:   Osteomyelitis of C6-7 Active Problems:   Injection of illicit drug within last 12 months   Syncope   Abscess in epidural space of cervical spine   Opioid dependence, daily use (HCC)   Positive hepatitis C antibody test   Severe sepsis (HCC)   Sepsis due to discitis/osteomyelitis of C6-7: -Anterior cervical discectomy with interbody fusion by Dr. Trenton Gammon on 12/22/2019. -Blood cultures show coag negative staph 1 out of 2 bottles likely contaminant. -Repeated blood cultures on 12/20/2019 have remained negative till date. -Patient will continue IV vancomycin Rocephin and rifampin a minimum of 6 weeks Started 01/18/2020 -3/19 patient's surgical site was redressed after becoming wet during showering, no warmth to touch Steri-Strips appear intact underneath however patient complaining of pruritus.  If still pruritic in the a.m. will contact surgery about either fully redressing surgical site or removing Steri-Strips.  recurrent syncope: -Echo was unremarkable EKG no significant findings question due to drug use in the setting of sepsis.  Injection of illicit drug within last 12 months: -He relates he went to detox about 4 weeks prior to admission -On admission  no urine tox screen obtained  Normocytic anemia: -Likely due to anemia of chronic disease. -We will need follow-up as an outpatient.  Constipation: -Resolved with MiraLAX.  Tinea cruris -Miconazole BID   DVT prophylaxis: SCD Code Status: Full Family Communication:  Disposition Plan: Patient will have to remain in hospital or SNF until completion of all IV antibiotics.  IV drug addict.   Consultants:  Neurosurgery  ID   Procedures/Significant Events:    I have personally reviewed and interpreted all radiology studies and my findings are as above.  VENTILATOR SETTINGS:    Cultures   Antimicrobials: Anti-infectives (From admission, onward)   Start     Dose/Rate Stop   12/31/19 1400  vancomycin (VANCOCIN) IVPB 1000 mg/200 mL premix     1,000 mg 200 mL/hr over 60 Minutes 02/13/20 2359   12/26/19 0800  rifampin (RIFADIN) capsule 300 mg     300 mg 02/13/20 2359   12/22/19 1938  bacitracin 50,000 Units in sodium chloride 0.9 % 500 mL irrigation  Status:  Discontinued      12/22/19 2048   12/22/19 1821  ceFAZolin (ANCEF) 2-4 GM/100ML-% IVPB    Note to Pharmacy: Rejeana Brock   : cabinet override    12/23/19 0629   12/22/19 1000  vancomycin (VANCOREADY) IVPB 1250 mg/250 mL  Status:  Discontinued     1,250 mg 166.7 mL/hr over 90 Minutes 12/31/19 0339   12/20/19 0600  vancomycin (VANCOREADY) IVPB 1750 mg/350 mL  Status:  Discontinued     1,750 mg 175 mL/hr over 120 Minutes 12/21/19 2301   12/20/19 0030  cefTRIAXone (ROCEPHIN) 2 g in sodium chloride 0.9 % 100 mL IVPB     2 g 200 mL/hr over 30 Minutes 02/13/20 2359       Devices    LINES / TUBES:      Continuous Infusions: . cefTRIAXone (ROCEPHIN)  IV 2 g (01/15/20 0104)  . lactated ringers 200 mL/hr at 12/22/19 2004  . vancomycin 1,000 mg (01/15/20 0342)     Objective: Vitals:   01/14/20 2032 01/15/20 0000 01/15/20 0340 01/15/20 0733  BP: 122/86 115/76 103/72 105/70  Pulse: 96 77 75 66  Resp: 18 18  14 18   Temp: 98 F (36.7 C) 97.6 F (36.4 C) 97.9 F (36.6 C) 97.9 F (36.6 C)  TempSrc: Oral Oral Oral Oral  SpO2: 99% 97% 97% 97%  Weight:      Height:        Intake/Output Summary (Last 24 hours) at 01/15/2020 01/17/2020 Last data filed at 01/14/2020 1230 Gross per 24 hour  Intake 240 ml  Output --  Net 240 ml   Filed Weights   12/20/19 0135  Weight: 71.5 kg   Physical Exam:  General: A/O x4, no acute respiratory distress Eyes: negative scleral hemorrhage, negative anisocoria, negative icterus ENT: Negative Runny nose, negative gingival bleeding, Neck:  Negative scars, masses, torticollis, lymphadenopathy, JVD, RIGHT lower neck surgical dressing in place clean negative sign of infection Lungs: Clear to auscultation bilaterally without wheezes or crackles Cardiovascular: Regular rate and rhythm without murmur gallop or rub normal S1 and S2 Abdomen: negative abdominal pain, nondistended, positive soft, bowel sounds, no rebound, no ascites, no appreciable mass Extremities: No significant cyanosis, clubbing, or edema bilateral lower extremities Skin: Negative rashes, lesions, ulcers Psychiatric:  Negative depression, negative anxiety, negative fatigue, negative mania  Central nervous system:  Cranial nerves II through XII intact, tongue/uvula midline, all extremities muscle strength 5/5, sensation intact throughout, negative dysarthria, negative expressive aphasia, negative receptive aphasia.      Data Reviewed: Care during the described time interval was provided by me .  I have reviewed this patient's available data, including medical history, events of note, physical examination, and all test results as part of my evaluation.  CBC: Recent Labs  Lab 01/11/20 0123  WBC 4.2  NEUTROABS 1.7  HGB 12.0*  HCT 37.3*  MCV 81.8  PLT 176   Basic Metabolic Panel: Recent Labs  Lab 01/09/20 0319 01/11/20 0123 01/14/20 0500  NA 140 137 140  K 3.7 3.8 3.8  CL 102 102 103  CO2  27 26 25   GLUCOSE 104* 117* 87  BUN 16 19 17   CREATININE 0.87 1.02 0.91  CALCIUM 9.4 9.1 9.5   GFR: Estimated Creatinine Clearance: 114.6 mL/min (by C-G formula based on SCr of 0.91 mg/dL). Liver Function Tests: Recent Labs  Lab 01/11/20 0123  AST 14*  ALT 17  ALKPHOS 102  BILITOT 0.5  PROT 6.6  ALBUMIN 3.4*   No results for input(s): LIPASE, AMYLASE in the last 168 hours. No results for input(s): AMMONIA in the last 168 hours. Coagulation Profile: No results for input(s): INR, PROTIME in the last 168 hours. Cardiac Enzymes: No results for input(s): CKTOTAL, CKMB, CKMBINDEX, TROPONINI in the last 168 hours. BNP (last 3 results) No results for input(s): PROBNP in the last 8760 hours. HbA1C: No results for input(s): HGBA1C in the last 72 hours. CBG: No results for input(s): GLUCAP in the last 168 hours. Lipid Profile: No results for input(s): CHOL, HDL, LDLCALC, TRIG, CHOLHDL, LDLDIRECT in  the last 72 hours. Thyroid Function Tests: No results for input(s): TSH, T4TOTAL, FREET4, T3FREE, THYROIDAB in the last 72 hours. Anemia Panel: No results for input(s): VITAMINB12, FOLATE, FERRITIN, TIBC, IRON, RETICCTPCT in the last 72 hours. Sepsis Labs: No results for input(s): PROCALCITON, LATICACIDVEN in the last 168 hours.  No results found for this or any previous visit (from the past 240 hour(s)).       Radiology Studies: No results found.      Scheduled Meds: . Chlorhexidine Gluconate Cloth  6 each Topical Daily  . gabapentin  100 mg Oral TID  . miconazole nitrate   Topical BID  . nicotine  14 mg Transdermal Daily  . rifampin  300 mg Oral BID WC  . sodium chloride flush  10-40 mL Intracatheter Q12H   Continuous Infusions: . cefTRIAXone (ROCEPHIN)  IV 2 g (01/15/20 0104)  . lactated ringers 200 mL/hr at 12/22/19 2004  . vancomycin 1,000 mg (01/15/20 0342)     LOS: 27 days    Time spent:40 min    Sondos Wolfman, Roselind Messier, MD [facility] Pager  [phone]  If 7PM-7AM, please contact night-coverage [email or web address] Password [facility] 01/15/2020, 9:37 AM

## 2020-01-16 NOTE — Progress Notes (Signed)
PROGRESS NOTE    Michael Lester  SAY:[id number] DOB: Oct 27, 1986 DOA: 12/19/2019 PCP: Patient, [name]     Brief Narrative:  Michael Lester is an 34 y.o. WM PMHx IVDU   Presents to [facility] after found unresponsive complaining of headache, neck pain right shoulder pain, he was found to be septic with tachycardia and leukocytosis CT of the C-spine show some lucency in C6-C7 MRI of the spine showed discitis and osteomyelitis of C6-C7 and paravertebral edema resulting in phlegmon with with spinal stenosis and cord mass-effect.    Subjective: 3/20 A/O x4, negative S OB, negative DOE.     Assessment & Plan:   Principal Problem:   Osteomyelitis of C6-7 Active Problems:   Injection of illicit drug within last 12 months   Syncope   Abscess in epidural space of cervical spine   Opioid dependence, daily use (HCC)   Positive hepatitis C antibody test   Severe sepsis (HCC)   Sepsis due to discitis/osteomyelitis of C6-7: -Anterior cervical discectomy with interbody fusion by Dr. Trenton Gammon on 12/22/2019. -Blood cultures show coag negative staph 1 out of 2 bottles likely contaminant. -Repeated blood cultures on 12/20/2019 have remained negative till date. -Patient will continue IV vancomycin Rocephin and rifampin a minimum of 6 weeks Started 01/18/2020.  (Stop date February 02, 2020) -3/19 patient's surgical site was redressed after becoming wet during showering, no warmth to touch Steri-Strips appear intact underneath however patient complaining of pruritus.  If still pruritic in the a.m. will contact surgery about either fully redressing surgical site or removing Steri-Strips.  recurrent syncope: -Echo was unremarkable EKG no significant findings question due to drug use in the setting of sepsis.  Injection of illicit drug within last 12 months: -He relates he went to detox about 4 weeks prior to admission -On admission no urine tox screen obtained  Normocytic anemia: -Likely due to anemia of  chronic disease. -We will need follow-up as an outpatient.  Constipation: -Resolved with MiraLAX.  Tinea cruris -Miconazole BID   DVT prophylaxis: SCD Code Status: Full Family Communication:  Disposition Plan: Patient will have to remain in hospital or SNF until completion of all IV antibiotics.  IV drug addict.   Consultants:  Neurosurgery  ID   Procedures/Significant Events:    I have personally reviewed and interpreted all radiology studies and my findings are as above.  VENTILATOR SETTINGS:    Cultures   Antimicrobials: Anti-infectives (From admission, onward)   Start     Dose/Rate Stop   01/13/20 1600  vancomycin (VANCOCIN) IVPB 1000 mg/200 mL premix     1,000 mg 200 mL/hr over 60 Minutes 02/03/20 0359   01/13/20 1446  vancomycin (VANCOCIN) IVPB 1000 mg/200 mL premix  Status:  Discontinued     1,000 mg 200 mL/hr over 60 Minutes 01/13/20 1447   01/11/20 1400  vancomycin (VANCOREADY) IVPB 750 mg/150 mL  Status:  Discontinued     750 mg 150 mL/hr over 60 Minutes 01/13/20 1446   12/31/19 1400  vancomycin (VANCOCIN) IVPB 1000 mg/200 mL premix  Status:  Discontinued     1,000 mg 200 mL/hr over 60 Minutes 01/11/20 0223   12/26/19 0800  rifampin (RIFADIN) capsule 300 mg     300 mg 02/02/20 2359   12/22/19 1938  bacitracin 50,000 Units in sodium chloride 0.9 % 500 mL irrigation  Status:  Discontinued      12/22/19 2048   12/22/19 1821  ceFAZolin (ANCEF) 2-4 GM/100ML-% IVPB  Note to Pharmacy: Little Ishikawa   : cabinet override    12/23/19 0629   12/22/19 1000  vancomycin (VANCOREADY) IVPB 1250 mg/250 mL  Status:  Discontinued     1,250 mg 166.7 mL/hr over 90 Minutes 12/31/19 0339   12/20/19 0600  vancomycin (VANCOREADY) IVPB 1750 mg/350 mL  Status:  Discontinued     1,750 mg 175 mL/hr over 120 Minutes 12/21/19 2301   12/20/19 0030  cefTRIAXone (ROCEPHIN) 2 g in sodium chloride 0.9 % 100 mL IVPB     2 g 200 mL/hr over 30 Minutes 02/02/20 2359        Devices    LINES / TUBES:      Continuous Infusions: . cefTRIAXone (ROCEPHIN)  IV 2 g (01/16/20 0058)  . lactated ringers 200 mL/hr at 12/22/19 2004  . vancomycin 1,000 mg (01/16/20 0425)     Objective: Vitals:   01/15/20 2015 01/15/20 2347 01/16/20 0325 01/16/20 0807  BP: 110/65 115/78 114/77 110/82  Pulse: 84 67 71   Resp: 19 19  18   Temp: 97.6 F (36.4 C) 98.2 F (36.8 C) 97.7 F (36.5 C) 97.9 F (36.6 C)  TempSrc: Oral Oral Oral Oral  SpO2: 100% 98% 99% 95%  Weight:      Height:        Intake/Output Summary (Last 24 hours) at 01/16/2020 01/18/2020 Last data filed at 01/15/2020 1220 Gross per 24 hour  Intake 360 ml  Output --  Net 360 ml   Filed Weights   12/20/19 0135  Weight: 71.5 kg   Physical Exam:  General: A/O x4, no acute respiratory distress Eyes: negative scleral hemorrhage, negative anisocoria, negative icterus ENT: Negative Runny nose, negative gingival bleeding, Neck:  Negative scars, masses, torticollis, lymphadenopathy, JVD, RIGHT lower neck surgical dressing in place clean negative sign of infection Lungs: Clear to auscultation bilaterally without wheezes or crackles Cardiovascular: Regular rate and rhythm without murmur gallop or rub normal S1 and S2 Abdomen: negative abdominal pain, nondistended, positive soft, bowel sounds, no rebound, no ascites, no appreciable mass Extremities: No significant cyanosis, clubbing, or edema bilateral lower extremities Skin: Negative rashes, lesions, ulcers Psychiatric:  Negative depression, negative anxiety, negative fatigue, negative mania  Central nervous system:  Cranial nerves II through XII intact, tongue/uvula midline, all extremities muscle strength 5/5, sensation intact throughout, negative dysarthria, negative expressive aphasia, negative receptive aphasia.      Data Reviewed: Care during the described time interval was provided by me .  I have reviewed this patient's available data, including  medical history, events of note, physical examination, and all test results as part of my evaluation.  CBC: Recent Labs  Lab 01/11/20 0123  WBC 4.2  NEUTROABS 1.7  HGB 12.0*  HCT 37.3*  MCV 81.8  PLT 176   Basic Metabolic Panel: Recent Labs  Lab 01/11/20 0123 01/14/20 0500  NA 137 140  K 3.8 3.8  CL 102 103  CO2 26 25  GLUCOSE 117* 87  BUN 19 17  CREATININE 1.02 0.91  CALCIUM 9.1 9.5   GFR: Estimated Creatinine Clearance: 114.6 mL/min (by C-G formula based on SCr of 0.91 mg/dL). Liver Function Tests: Recent Labs  Lab 01/11/20 0123  AST 14*  ALT 17  ALKPHOS 102  BILITOT 0.5  PROT 6.6  ALBUMIN 3.4*   No results for input(s): LIPASE, AMYLASE in the last 168 hours. No results for input(s): AMMONIA in the last 168 hours. Coagulation Profile: No results for input(s): INR, PROTIME in the last  168 hours. Cardiac Enzymes: No results for input(s): CKTOTAL, CKMB, CKMBINDEX, TROPONINI in the last 168 hours. BNP (last 3 results) No results for input(s): PROBNP in the last 8760 hours. HbA1C: No results for input(s): HGBA1C in the last 72 hours. CBG: No results for input(s): GLUCAP in the last 168 hours. Lipid Profile: No results for input(s): CHOL, HDL, LDLCALC, TRIG, CHOLHDL, LDLDIRECT in the last 72 hours. Thyroid Function Tests: No results for input(s): TSH, T4TOTAL, FREET4, T3FREE, THYROIDAB in the last 72 hours. Anemia Panel: No results for input(s): VITAMINB12, FOLATE, FERRITIN, TIBC, IRON, RETICCTPCT in the last 72 hours. Sepsis Labs: No results for input(s): PROCALCITON, LATICACIDVEN in the last 168 hours.  No results found for this or any previous visit (from the past 240 hour(s)).       Radiology Studies: No results found.      Scheduled Meds: . Chlorhexidine Gluconate Cloth  6 each Topical Daily  . gabapentin  100 mg Oral TID  . miconazole nitrate   Topical BID  . nicotine  14 mg Transdermal Daily  . rifampin  300 mg Oral BID WC  . sodium  chloride flush  10-40 mL Intracatheter Q12H   Continuous Infusions: . cefTRIAXone (ROCEPHIN)  IV 2 g (01/16/20 0058)  . lactated ringers 200 mL/hr at 12/22/19 2004  . vancomycin 1,000 mg (01/16/20 0425)     LOS: 28 days    Time spent:40 min    Nazeer Romney, Roselind Messier, MD [facility] Pager [phone]  If 7PM-7AM, please contact night-coverage [email or web address] Password [facility] 01/16/2020, 9:59 AM

## 2020-01-17 LAB — BASIC METABOLIC PANEL
Anion gap: 9 (ref 5–15)
BUN: 15 mg/dL (ref 6–20)
CO2: 24 mmol/L (ref 22–32)
Calcium: 9.1 mg/dL (ref 8.9–10.3)
Chloride: 104 mmol/L (ref 98–111)
Creatinine, Ser: 0.87 mg/dL (ref 0.61–1.24)
GFR calc Af Amer: >60 mL/min (ref >60)
GFR calc non Af Amer: >60 mL/min (ref >60)
Glucose, Bld: 85 mg/dL (ref 70–99)
Potassium: 3.7 mmol/L (ref 3.5–5.1)
Sodium: 137 mmol/L (ref 135–145)

## 2020-01-17 NOTE — Progress Notes (Signed)
Pharmacy Antibiotic Note  Michael Lester is a 34 y.o. male admitted on 12/19/2019 with osteomyelitis of the cervical spine. Pharmacy has been consulted for vancomycin dosing.  SCr fluctuating this admission - now back to 0.87. Afebrile. WBC normal.  Will plan to continue current dose and repeat level this week unless SCr significantly changes.   Plan: Continue vancomycin to 1000 mg IV q12h  -Calc AUC 525 -Goal AUC 400-550 Tentative stop date for all antibiotics: 02/02/20 Monitor clinical progress, renal function, vanc levels as clinically indicated.   Height: 6\' 1"  (185.4 cm) Weight: 157 lb 10.1 oz (71.5 kg) IBW/kg (Calculated) : 79.9  Temp (24hrs), Avg:98.1 F (36.7 C), Min:97.6 F (36.4 C), Max:98.4 F (36.9 C)  Recent Labs  Lab 01/10/20 1628 01/11/20 0123 01/13/20 0535 01/13/20 1330 01/14/20 0500 01/17/20 0500  WBC  --  4.2  --   --   --   --   CREATININE  --  1.02  --   --  0.91 0.87  VANCOTROUGH  --  14*  --  8*  --   --   VANCOPEAK 38  --  23*  --   --   --     Estimated Creatinine Clearance: 119.9 mL/min (by C-G formula based on SCr of 0.87 mg/dL).    Allergies  Allergen Reactions  . Shellfish Allergy Anaphylaxis    Antimicrobials this admission: Vanc 2/20 >> CTX 2/21 >> Rifampin 2/27 >> Cefepime/Flagyl x1 2/20  Dose adjustments this admission: Vanc 1250 mg q12h (2/26) >> 1g q12h (3/4) >> 750mg  q12h (3/14) >> 1000 mg q12h (3/17) >>   Thank you for allowing pharmacy to be a part of this patient's care.  06-08-1982, PharmD Candidate  01/17/2020 11:56 AM

## 2020-01-17 NOTE — Progress Notes (Signed)
PROGRESS NOTE    BUD KAESER  IRS:[id number] DOB: 31-Mar-1986 DOA: 12/19/2019 PCP: Patient, [name]     Brief Narrative:  Michael Lester is an 34 y.o. WM PMHx IVDU   Presents to [facility] after found unresponsive complaining of headache, neck pain right shoulder pain, he was found to be septic with tachycardia and leukocytosis CT of the C-spine show some lucency in C6-C7 MRI of the spine showed discitis and osteomyelitis of C6-C7 and paravertebral edema resulting in phlegmon with with spinal stenosis and cord mass-effect.    Subjective: 3/21 A/O x4, negative S OB, negative DOE.  Still mild pruritus under foam dressing on neck.     Assessment & Plan:   Principal Problem:   Osteomyelitis of C6-7 Active Problems:   Injection of illicit drug within last 12 months   Syncope   Abscess in epidural space of cervical spine   Opioid dependence, daily use (HCC)   Positive hepatitis C antibody test   Severe sepsis (HCC)   Sepsis due to discitis/osteomyelitis of C6-7: -Anterior cervical discectomy with interbody fusion by Dr. Dutch Quint on 12/22/2019. -Blood cultures show coag negative staph 1 out of 2 bottles likely contaminant. -Repeated blood cultures on 12/20/2019 have remained negative till date. -Patient will continue IV vancomycin Rocephin and rifampin a minimum of 6 weeks Started 01/18/2020.  (Stop date February 02, 2020) -3/19 patient's surgical site was redressed after becoming wet during showering, no warmth to touch Steri-Strips appear intact underneath however patient complaining of pruritus.  If still pruritic in the a.m. will contact surgery about either fully redressing surgical site or removing Steri-Strips.  recurrent syncope: -Echo was unremarkable EKG no significant findings question due to drug use in the setting of sepsis.  Injection of illicit drug within last 12 months: -He relates he went to detox about 4 weeks prior to admission -On admission no urine tox screen  obtained  Normocytic anemia: -Likely due to anemia of chronic disease. -We will need follow-up as an outpatient.  Constipation: -Resolved with MiraLAX.  Tinea cruris -Miconazole BID   DVT prophylaxis: SCD Code Status: Full Family Communication:  Disposition Plan: Patient will have to remain in hospital or SNF until completion of all IV antibiotics.  IV drug addict.   Consultants:  Neurosurgery  ID   Procedures/Significant Events:    I have personally reviewed and interpreted all radiology studies and my findings are as above.  VENTILATOR SETTINGS:    Cultures   Antimicrobials: Anti-infectives (From admission, onward)   Start     Dose/Rate Stop   01/13/20 1600  vancomycin (VANCOCIN) IVPB 1000 mg/200 mL premix     1,000 mg 200 mL/hr over 60 Minutes 02/03/20 0359   01/13/20 1446  vancomycin (VANCOCIN) IVPB 1000 mg/200 mL premix  Status:  Discontinued     1,000 mg 200 mL/hr over 60 Minutes 01/13/20 1447   01/11/20 1400  vancomycin (VANCOREADY) IVPB 750 mg/150 mL  Status:  Discontinued     750 mg 150 mL/hr over 60 Minutes 01/13/20 1446   12/31/19 1400  vancomycin (VANCOCIN) IVPB 1000 mg/200 mL premix  Status:  Discontinued     1,000 mg 200 mL/hr over 60 Minutes 01/11/20 0223   12/26/19 0800  rifampin (RIFADIN) capsule 300 mg     300 mg 02/02/20 2359   12/22/19 1938  bacitracin 50,000 Units in sodium chloride 0.9 % 500 mL irrigation  Status:  Discontinued      12/22/19 2048   12/22/19 1821  ceFAZolin (ANCEF) 2-4 GM/100ML-% IVPB    Note to Pharmacy: Rejeana Brock   : cabinet override    12/23/19 0629   12/22/19 1000  vancomycin (VANCOREADY) IVPB 1250 mg/250 mL  Status:  Discontinued     1,250 mg 166.7 mL/hr over 90 Minutes 12/31/19 0339   12/20/19 0600  vancomycin (VANCOREADY) IVPB 1750 mg/350 mL  Status:  Discontinued     1,750 mg 175 mL/hr over 120 Minutes 12/21/19 2301   12/20/19 0030  cefTRIAXone (ROCEPHIN) 2 g in sodium chloride 0.9 % 100 mL IVPB     2  g 200 mL/hr over 30 Minutes 02/02/20 2359       Devices    LINES / TUBES:      Continuous Infusions: . cefTRIAXone (ROCEPHIN)  IV 2 g (01/17/20 0126)  . lactated ringers 200 mL/hr at 12/22/19 2004  . vancomycin 1,000 mg (01/17/20 0507)     Objective: Vitals:   01/16/20 2019 01/16/20 2309 01/17/20 0414 01/17/20 0841  BP: 123/83 118/81 112/74 118/84  Pulse: 92 75 72 93  Resp: 18 16 18 16   Temp: 98.3 F (36.8 C) 98.1 F (36.7 C) 97.6 F (36.4 C) 98.3 F (36.8 C)  TempSrc: Oral Oral Oral Oral  SpO2: 99% 99% 99% 99%  Weight:      Height:        Intake/Output Summary (Last 24 hours) at 01/17/2020 9702 Last data filed at 01/17/2020 0900 Gross per 24 hour  Intake 240 ml  Output --  Net 240 ml   Filed Weights   12/20/19 0135  Weight: 71.5 kg   Physical Exam:  General: A/O x4, no acute respiratory distress Eyes: negative scleral hemorrhage, negative anisocoria, negative icterus ENT: Negative Runny nose, negative gingival bleeding, Neck:  Negative scars, masses, torticollis, lymphadenopathy, JVD, RIGHT lower neck surgical dressing in place clean negative sign of infection Lungs: Clear to auscultation bilaterally without wheezes or crackles Cardiovascular: Regular rate and rhythm without murmur gallop or rub normal S1 and S2 Abdomen: negative abdominal pain, nondistended, positive soft, bowel sounds, no rebound, no ascites, no appreciable mass Extremities: No significant cyanosis, clubbing, or edema bilateral lower extremities Skin: Negative rashes, lesions, ulcers Psychiatric:  Negative depression, negative anxiety, negative fatigue, negative mania  Central nervous system:  Cranial nerves II through XII intact, tongue/uvula midline, all extremities muscle strength 5/5, sensation intact throughout, negative dysarthria, negative expressive aphasia, negative receptive aphasia.      Data Reviewed: Care during the described time interval was provided by me .  I have  reviewed this patient's available data, including medical history, events of note, physical examination, and all test results as part of my evaluation.  CBC: Recent Labs  Lab 01/11/20 0123  WBC 4.2  NEUTROABS 1.7  HGB 12.0*  HCT 37.3*  MCV 81.8  PLT 637   Basic Metabolic Panel: Recent Labs  Lab 01/11/20 0123 01/14/20 0500 01/17/20 0500  NA 137 140 137  K 3.8 3.8 3.7  CL 102 103 104  CO2 26 25 24   GLUCOSE 117* 87 85  BUN 19 17 15   CREATININE 1.02 0.91 0.87  CALCIUM 9.1 9.5 9.1   GFR: Estimated Creatinine Clearance: 119.9 mL/min (by C-G formula based on SCr of 0.87 mg/dL). Liver Function Tests: Recent Labs  Lab 01/11/20 0123  AST 14*  ALT 17  ALKPHOS 102  BILITOT 0.5  PROT 6.6  ALBUMIN 3.4*   No results for input(s): LIPASE, AMYLASE in the last 168 hours. No results for  input(s): AMMONIA in the last 168 hours. Coagulation Profile: No results for input(s): INR, PROTIME in the last 168 hours. Cardiac Enzymes: No results for input(s): CKTOTAL, CKMB, CKMBINDEX, TROPONINI in the last 168 hours. BNP (last 3 results) No results for input(s): PROBNP in the last 8760 hours. HbA1C: No results for input(s): HGBA1C in the last 72 hours. CBG: No results for input(s): GLUCAP in the last 168 hours. Lipid Profile: No results for input(s): CHOL, HDL, LDLCALC, TRIG, CHOLHDL, LDLDIRECT in the last 72 hours. Thyroid Function Tests: No results for input(s): TSH, T4TOTAL, FREET4, T3FREE, THYROIDAB in the last 72 hours. Anemia Panel: No results for input(s): VITAMINB12, FOLATE, FERRITIN, TIBC, IRON, RETICCTPCT in the last 72 hours. Sepsis Labs: No results for input(s): PROCALCITON, LATICACIDVEN in the last 168 hours.  No results found for this or any previous visit (from the past 240 hour(s)).       Radiology Studies: No results found.      Scheduled Meds: . Chlorhexidine Gluconate Cloth  6 each Topical Daily  . gabapentin  100 mg Oral TID  . miconazole nitrate    Topical BID  . nicotine  14 mg Transdermal Daily  . rifampin  300 mg Oral BID WC  . sodium chloride flush  10-40 mL Intracatheter Q12H   Continuous Infusions: . cefTRIAXone (ROCEPHIN)  IV 2 g (01/17/20 0126)  . lactated ringers 200 mL/hr at 12/22/19 2004  . vancomycin 1,000 mg (01/17/20 0507)     LOS: 29 days    Time spent:40 min    Nichlos Kunzler, Roselind Messier, MD [facility] Pager [phone]  If 7PM-7AM, please contact night-coverage [email or web address] Password [facility] 01/17/2020, 9:56 AM

## 2020-01-18 DIAGNOSIS — R768 Other specified abnormal immunological findings in serum: Secondary | ICD-10-CM

## 2020-01-18 DIAGNOSIS — Z95828 Presence of other vascular implants and grafts: Secondary | ICD-10-CM

## 2020-01-18 DIAGNOSIS — F1120 Opioid dependence, uncomplicated: Secondary | ICD-10-CM

## 2020-01-18 LAB — COMPREHENSIVE METABOLIC PANEL
ALT: 37 U/L (ref 0–44)
AST: 25 U/L (ref 15–41)
Albumin: 3.6 g/dL (ref 3.5–5.0)
Alkaline Phosphatase: 87 U/L (ref 38–126)
Anion gap: 8 (ref 5–15)
BUN: 13 mg/dL (ref 6–20)
CO2: 26 mmol/L (ref 22–32)
Calcium: 9.2 mg/dL (ref 8.9–10.3)
Chloride: 104 mmol/L (ref 98–111)
Creatinine, Ser: 1.07 mg/dL (ref 0.61–1.24)
GFR calc Af Amer: >60 mL/min (ref >60)
GFR calc non Af Amer: >60 mL/min (ref >60)
Glucose, Bld: 152 mg/dL — ABNORMAL HIGH (ref 70–99)
Potassium: 3.5 mmol/L (ref 3.5–5.1)
Sodium: 138 mmol/L (ref 135–145)
Total Bilirubin: 0.2 mg/dL — ABNORMAL LOW (ref 0.3–1.2)
Total Protein: 6.5 g/dL (ref 6.5–8.1)

## 2020-01-18 LAB — CBC WITH DIFFERENTIAL/PLATELET
Abs Immature Granulocytes: 0.01 10*3/uL (ref 0.00–0.07)
Basophils Absolute: 0.1 10*3/uL (ref 0.0–0.1)
Basophils Relative: 2 %
Eosinophils Absolute: 0.2 10*3/uL (ref 0.0–0.5)
Eosinophils Relative: 7 %
HCT: 38.4 % — ABNORMAL LOW (ref 39.0–52.0)
Hemoglobin: 12.6 g/dL — ABNORMAL LOW (ref 13.0–17.0)
Immature Granulocytes: 0 %
Lymphocytes Relative: 39 %
Lymphs Abs: 1.2 10*3/uL (ref 0.7–4.0)
MCH: 27 pg (ref 26.0–34.0)
MCHC: 32.8 g/dL (ref 30.0–36.0)
MCV: 82.4 fL (ref 80.0–100.0)
Monocytes Absolute: 0.2 10*3/uL (ref 0.1–1.0)
Monocytes Relative: 7 %
Neutro Abs: 1.4 10*3/uL — ABNORMAL LOW (ref 1.7–7.7)
Neutrophils Relative %: 45 %
Platelets: 153 10*3/uL (ref 150–400)
RBC: 4.66 MIL/uL (ref 4.22–5.81)
RDW: 15.4 % (ref 11.5–15.5)
WBC: 3.1 10*3/uL — ABNORMAL LOW (ref 4.0–10.5)
nRBC: 0 % (ref 0.0–0.2)

## 2020-01-18 LAB — PHOSPHORUS: Phosphorus: 4 mg/dL (ref 2.5–4.6)

## 2020-01-18 LAB — MAGNESIUM: Magnesium: 1.6 mg/dL — ABNORMAL LOW (ref 1.7–2.4)

## 2020-01-18 NOTE — Plan of Care (Signed)
  Problem: Education: Goal: Knowledge of General Education information will improve Description: Including pain rating scale, medication(s)/side effects and non-pharmacologic comfort measures 01/18/2020 1200 by Elba Barman, RN Outcome: Progressing 01/18/2020 1200 by Elba Barman, RN Outcome: Progressing   Problem: Health Behavior/Discharge Planning: Goal: Ability to manage health-related needs will improve 01/18/2020 1200 by Elba Barman, RN Outcome: Progressing 01/18/2020 1200 by Elba Barman, RN Outcome: Progressing   Problem: Clinical Measurements: Goal: Ability to maintain clinical measurements within normal limits will improve 01/18/2020 1200 by Elba Barman, RN Outcome: Progressing 01/18/2020 1200 by Elba Barman, RN Outcome: Progressing Goal: Will remain free from infection 01/18/2020 1200 by Elba Barman, RN Outcome: Progressing 01/18/2020 1200 by Elba Barman, RN Outcome: Progressing Goal: Diagnostic test results will improve 01/18/2020 1200 by Elba Barman, RN Outcome: Progressing 01/18/2020 1200 by Elba Barman, RN Outcome: Progressing Goal: Respiratory complications will improve 01/18/2020 1200 by Elba Barman, RN Outcome: Progressing 01/18/2020 1200 by Elba Barman, RN Outcome: Progressing Goal: Cardiovascular complication will be avoided 01/18/2020 1200 by Elba Barman, RN Outcome: Progressing 01/18/2020 1200 by Elba Barman, RN Outcome: Progressing   Problem: Activity: Goal: Risk for activity intolerance will decrease 01/18/2020 1200 by Elba Barman, RN Outcome: Progressing 01/18/2020 1200 by Elba Barman, RN Outcome: Progressing   Problem: Nutrition: Goal: Adequate nutrition will be maintained 01/18/2020 1200 by Elba Barman, RN Outcome: Progressing 01/18/2020 1200 by Elba Barman, RN Outcome: Progressing   Problem: Coping: Goal: Level of anxiety will decrease 01/18/2020 1200 by Elba Barman, RN Outcome:  Progressing 01/18/2020 1200 by Elba Barman, RN Outcome: Progressing   Problem: Elimination: Goal: Will not experience complications related to bowel motility 01/18/2020 1200 by Elba Barman, RN Outcome: Progressing 01/18/2020 1200 by Elba Barman, RN Outcome: Progressing Goal: Will not experience complications related to urinary retention 01/18/2020 1200 by Elba Barman, RN Outcome: Progressing 01/18/2020 1200 by Elba Barman, RN Outcome: Progressing   Problem: Pain Managment: Goal: General experience of comfort will improve 01/18/2020 1200 by Elba Barman, RN Outcome: Progressing 01/18/2020 1200 by Elba Barman, RN Outcome: Progressing   Problem: Safety: Goal: Ability to remain free from injury will improve 01/18/2020 1200 by Elba Barman, RN Outcome: Progressing 01/18/2020 1200 by Elba Barman, RN Outcome: Progressing   Problem: Skin Integrity: Goal: Risk for impaired skin integrity will decrease 01/18/2020 1200 by Elba Barman, RN Outcome: Progressing 01/18/2020 1200 by Elba Barman, RN Outcome: Progressing

## 2020-01-18 NOTE — Progress Notes (Signed)
Michael Lester  SAY:[id number] DOB: Nov 23, 1985 DOA: 12/19/2019 PCP: Patient, [name]     Brief Narrative:  Michael Lester is an 34 y.o. WM PMHx IVDU   Presents to [facility] after found unresponsive complaining of headache, neck pain right shoulder pain, he was found to be septic with tachycardia and leukocytosis CT of the C-spine show some lucency in C6-C7 MRI of the spine showed discitis and osteomyelitis of C6-C7 and paravertebral edema resulting in phlegmon with with spinal stenosis and cord mass-effect.    Subjective: 3/23 A/O x4, negative S OB, negative DOE.  Incision site clean healing well   Assessment & Plan:   Principal Problem:   Osteomyelitis of C6-7 Active Problems:   Injection of illicit drug within last 12 months   Syncope   Abscess in epidural space of cervical spine   Opioid dependence, daily use (HCC)   Positive hepatitis C antibody test   Severe sepsis (HCC)   Sepsis due to discitis/osteomyelitis of C6-7: -Anterior cervical discectomy with interbody fusion by Dr. Dutch Quint on 12/22/2019. -Blood cultures show coag negative staph 1 out of 2 bottles likely contaminant. -Repeated blood cultures on 12/20/2019 have remained negative till date. -Patient will continue IV vancomycin Rocephin and rifampin a minimum of 6 weeks Started 01/18/2020.  (Stop date February 02, 2020) -3/23 ordered removal of foam dressing and Steri-Strips overnight.  Incision site healed well negative sign of infection.  Patient order to cover with Tegaderm whenever he showers  recurrent syncope: -Echo was unremarkable EKG no significant findings question due to drug use in the setting of sepsis.  Injection of illicit drug within last 12 months: -He relates he went to detox about 4 weeks prior to admission -On admission no urine tox screen obtained  Normocytic anemia: -Likely due to anemia of chronic disease. -We will need follow-up as an outpatient.  Constipation: -Resolved  with MiraLAX.  Tinea cruris -Miconazole BID   DVT prophylaxis: SCD Code Status: Full Family Communication:  Disposition Plan: Patient will have to remain in hospital or SNF until completion of all IV antibiotics.  IV drug addict.   Consultants:  Neurosurgery  ID   Procedures/Significant Events:    I have personally reviewed and interpreted all radiology studies and my findings are as above.  VENTILATOR SETTINGS:    Cultures   Antimicrobials: Anti-infectives (From admission, onward)   Start     Dose/Rate Stop   01/13/20 1600  vancomycin (VANCOCIN) IVPB 1000 mg/200 mL premix     1,000 mg 200 mL/hr over 60 Minutes 02/03/20 0359   01/13/20 1446  vancomycin (VANCOCIN) IVPB 1000 mg/200 mL premix  Status:  Discontinued     1,000 mg 200 mL/hr over 60 Minutes 01/13/20 1447   01/11/20 1400  vancomycin (VANCOREADY) IVPB 750 mg/150 mL  Status:  Discontinued     750 mg 150 mL/hr over 60 Minutes 01/13/20 1446   12/31/19 1400  vancomycin (VANCOCIN) IVPB 1000 mg/200 mL premix  Status:  Discontinued     1,000 mg 200 mL/hr over 60 Minutes 01/11/20 0223   12/26/19 0800  rifampin (RIFADIN) capsule 300 mg     300 mg 02/02/20 2359   12/22/19 1938  bacitracin 50,000 Units in sodium chloride 0.9 % 500 mL irrigation  Status:  Discontinued      12/22/19 2048   12/22/19 1821  ceFAZolin (ANCEF) 2-4 GM/100ML-% IVPB    Note to Pharmacy: Little Ishikawa   : cabinet override  12/23/19 0629   12/22/19 1000  vancomycin (VANCOREADY) IVPB 1250 mg/250 mL  Status:  Discontinued     1,250 mg 166.7 mL/hr over 90 Minutes 12/31/19 0339   12/20/19 0600  vancomycin (VANCOREADY) IVPB 1750 mg/350 mL  Status:  Discontinued     1,750 mg 175 mL/hr over 120 Minutes 12/21/19 2301   12/20/19 0030  cefTRIAXone (ROCEPHIN) 2 g in sodium chloride 0.9 % 100 mL IVPB     2 g 200 mL/hr over 30 Minutes 02/02/20 2359       Devices    LINES / TUBES:      Continuous Infusions: . cefTRIAXone (ROCEPHIN)  IV 2 g  (01/18/20 0019)  . lactated ringers 200 mL/hr at 12/22/19 2004  . vancomycin 1,000 mg (01/18/20 0421)     Objective: Vitals:   01/17/20 1937 01/17/20 2310 01/18/20 0308 01/18/20 0803  BP: 110/66 122/81 116/76 117/81  Pulse: 85 66 74 91  Resp:    20  Temp: 98.5 F (36.9 C) 98.1 F (36.7 C) 97.8 F (36.6 C) 97.6 F (36.4 C)  TempSrc:  Oral Oral Oral  SpO2:  100% 99% 98%  Weight:      Height:        Intake/Output Summary (Last 24 hours) at 01/18/2020 3154 Last data filed at 01/18/2020 0700 Gross per 24 hour  Intake 1200 ml  Output --  Net 1200 ml   Filed Weights   12/20/19 0135  Weight: 71.5 kg   Physical Exam:  General: A/O x4, no acute respiratory distress Eyes: negative scleral hemorrhage, negative anisocoria, negative icterus ENT: Negative Runny nose, negative gingival bleeding, Neck:  Negative scars, masses, torticollis, lymphadenopathy, JVD, RIGHT lower neck surgical dressing in place clean negative sign of infection Lungs: Clear to auscultation bilaterally without wheezes or crackles Cardiovascular: Regular rate and rhythm without murmur gallop or rub normal S1 and S2 Abdomen: negative abdominal pain, nondistended, positive soft, bowel sounds, no rebound, no ascites, no appreciable mass Extremities: No significant cyanosis, clubbing, or edema bilateral lower extremities Skin: Negative rashes, lesions, ulcers Psychiatric:  Negative depression, negative anxiety, negative fatigue, negative mania  Central nervous system:  Cranial nerves II through XII intact, tongue/uvula midline, all extremities muscle strength 5/5, sensation intact throughout, negative dysarthria, negative expressive aphasia, negative receptive aphasia.      Data Reviewed: Care during the described time interval was provided by me .  I have reviewed this patient's available data, including medical history, events of note, physical examination, and all test results as part of my  evaluation.  CBC: No results for input(s): WBC, NEUTROABS, HGB, HCT, MCV, PLT in the last 168 hours. Basic Metabolic Panel: Recent Labs  Lab 01/14/20 0500 01/17/20 0500  NA 140 137  K 3.8 3.7  CL 103 104  CO2 25 24  GLUCOSE 87 85  BUN 17 15  CREATININE 0.91 0.87  CALCIUM 9.5 9.1   GFR: Estimated Creatinine Clearance: 119.9 mL/min (by C-G formula based on SCr of 0.87 mg/dL). Liver Function Tests: No results for input(s): AST, ALT, ALKPHOS, BILITOT, PROT, ALBUMIN in the last 168 hours. No results for input(s): LIPASE, AMYLASE in the last 168 hours. No results for input(s): AMMONIA in the last 168 hours. Coagulation Profile: No results for input(s): INR, PROTIME in the last 168 hours. Cardiac Enzymes: No results for input(s): CKTOTAL, CKMB, CKMBINDEX, TROPONINI in the last 168 hours. BNP (last 3 results) No results for input(s): PROBNP in the last 8760 hours. HbA1C: No results for  input(s): HGBA1C in the last 72 hours. CBG: No results for input(s): GLUCAP in the last 168 hours. Lipid Profile: No results for input(s): CHOL, HDL, LDLCALC, TRIG, CHOLHDL, LDLDIRECT in the last 72 hours. Thyroid Function Tests: No results for input(s): TSH, T4TOTAL, FREET4, T3FREE, THYROIDAB in the last 72 hours. Anemia Panel: No results for input(s): VITAMINB12, FOLATE, FERRITIN, TIBC, IRON, RETICCTPCT in the last 72 hours. Sepsis Labs: No results for input(s): PROCALCITON, LATICACIDVEN in the last 168 hours.  No results found for this or any previous visit (from the past 240 hour(s)).       Radiology Studies: No results found.      Scheduled Meds: . Chlorhexidine Gluconate Cloth  6 each Topical Daily  . gabapentin  100 mg Oral TID  . miconazole nitrate   Topical BID  . nicotine  14 mg Transdermal Daily  . rifampin  300 mg Oral BID WC  . sodium chloride flush  10-40 mL Intracatheter Q12H   Continuous Infusions: . cefTRIAXone (ROCEPHIN)  IV 2 g (01/18/20 0019)  . lactated  ringers 200 mL/hr at 12/22/19 2004  . vancomycin 1,000 mg (01/18/20 0421)     LOS: 30 days    Time spent:40 min    Hazelle Woollard, Geraldo Docker, MD [facility] Pager [phone]  If 7PM-7AM, please contact night-coverage [email or web address] Password [facility] 01/18/2020, 8:52 AM

## 2020-01-18 NOTE — Progress Notes (Signed)
       [facility]  Date of Admission:  12/19/2019      Total days of antibiotics 30   Rifampin   Vancomycin  Ceftriaxone    ASSESSMENT: Michael Lester is a 34 y.o. male with cervical spine epidural abscess s/p evacuation and hardware stabilization currently on day 16 / 42 for treatment inpatient. He is motivated to stay clean from opiates. Discussed that he may like to consider trying only ibuprofen or tylenol for pain control soon in the AM. States his PM dose of opioid is mostly to help him sleep.  Being he is 4 weeks out from surgery would presume his neck incision can now be open to air.   With hardware placed, would presume that he will need ongoing therapy with PO, at least to target possible staph aureus.  Recommendations to follow.   (+) Hepatitis C Ab screen - very low level Hep C RNA (60 and repeated now 330). ?clearing infection.  Will re-assess at outpatient follow up need for treatment. . No previous records available to determine chronicity.     PLAN: 1. Repeat CRP / ESR in AM for therapeutic monitoring 2. Ongoing 2x weekly BMP; LFT + CBC weekly.  3. No changes to antimicrobials  4. ?Can his surgical dressing can be removed - 4 weeks out following surgery and would expect that it is healed.    Will follow up next week or sooner should his condition change.     Principal Problem:   Osteomyelitis of C6-7 Active Problems:   Abscess in epidural space of cervical spine   Injection of illicit drug within last 12 months   Syncope   Opioid dependence, daily use (HCC)   Positive hepatitis C antibody test   Severe sepsis (HCC)   . Chlorhexidine Gluconate Cloth  6 each Topical Daily  . gabapentin  100 mg Oral TID  . miconazole nitrate   Topical BID  . nicotine  14 mg Transdermal Daily  . rifampin  300 mg Oral BID WC  . sodium chloride flush  10-40 mL Intracatheter Q12H    SUBJECTIVE: Feeling better than when I saw him a few  weeks ago. Does have some pain early in the AM and at night; he is trying to decrease his pain medication as he does not want to be dependent on anything any longer. Does still have numbness to the right pointer finger that he has not noticed return yet.   Was showering recently and got some water under anterior honeycomb dressing. Noticed some itching thereafter that was bothersome. No drainage or trouble with the incision from his report but has continued to keep a clean/dry honeycomb dressing in place now.   He has 16 days left in therapy for IV treatment of his infection. He is excited to go home to see his little boy again soon.    Review of Systems: Review of Systems  Constitutional: Negative for chills and fever.  HENT: Negative for hearing loss.   Respiratory: Negative for cough and shortness of breath.   Cardiovascular: Negative for chest pain and leg swelling.  Gastrointestinal: Negative for abdominal pain, diarrhea and vomiting.  Genitourinary: Negative for dysuria.  Musculoskeletal: Positive for neck pain.  Skin: Negative for rash.  Neurological: Negative for dizziness.    Allergies  Allergen Reactions  . Shellfish Allergy Anaphylaxis    OBJECTIVE: Vitals:   01/17/20 1937 01/17/20 2310 01/18/20 0308 01/18/20 0803  BP: 110/66   122/81 116/76 117/81  Pulse: 85 66 74 91  Resp:    20  Temp: 98.5 F (36.9 C) 98.1 F (36.7 C) 97.8 F (36.6 C) 97.6 F (36.4 C)  TempSrc:  Oral Oral Oral  SpO2:  100% 99% 98%  Weight:      Height:       Body mass index is 20.8 kg/m.  Physical Exam Constitutional:      Appearance: Normal appearance.     Comments: Sitting in bed comfortably talking with dietary team.   HENT:     Mouth/Throat:     Mouth: Mucous membranes are moist.     Pharynx: Oropharynx is clear.  Eyes:     Pupils: Pupils are equal, round, and reactive to light.  Neck:     Comments: Anterior honeycomb dressing is clean and dry. Tegaderm intact. Underlying steri  strips in place still. Unable to visualize well.  Cardiovascular:     Rate and Rhythm: Normal rate and regular rhythm.     Heart sounds: No murmur.  Pulmonary:     Effort: Pulmonary effort is normal.     Breath sounds: Normal breath sounds.  Abdominal:     General: Abdomen is flat. There is no distension.  Musculoskeletal:        General: Normal range of motion.  Skin:    General: Skin is warm and dry.     Capillary Refill: Capillary refill takes less than 2 seconds.  Neurological:     Mental Status: He is alert and oriented to person, place, and time.  Psychiatric:        Mood and Affect: Mood normal.        Behavior: Behavior normal.   RUE PICC line - clean/dry dressing. Insertion site w/o erythema, tenderness, drainage, cording or distal swelling of affected extremity    Lab Results Lab Results  Component Value Date   WBC 3.1 (L) 01/18/2020   HGB 12.6 (L) 01/18/2020   HCT 38.4 (L) 01/18/2020   MCV 82.4 01/18/2020   PLT 153 01/18/2020    Lab Results  Component Value Date   CREATININE 1.07 01/18/2020   BUN 13 01/18/2020   NA 138 01/18/2020   K 3.5 01/18/2020   CL 104 01/18/2020   CO2 26 01/18/2020    Lab Results  Component Value Date   ALT 37 01/18/2020   AST 25 01/18/2020   ALKPHOS 87 01/18/2020   BILITOT 0.2 (L) 01/18/2020     Microbiology: No results found for this or any previous visit (from the past 240 hour(s)).   Janene Madeira, MSN, NP-C [facility] [email or web address] Pager: [phone] Office: [phone] [place]: [phone]

## 2020-01-18 NOTE — Progress Notes (Signed)
PROGRESS NOTE    [name]  IHK:[id number] DOB: 04-05-1986 DOA: 12/19/2019 PCP: Patient, [name]     Brief Narrative:  Michael Lester is an 34 y.o. WM PMHx IVDU   Presents to [facility] after found unresponsive complaining of headache, neck pain right shoulder pain, he was found to be septic with tachycardia and leukocytosis CT of the C-spine show some lucency in C6-C7 MRI of the spine showed discitis and osteomyelitis of C6-C7 and paravertebral edema resulting in phlegmon with with spinal stenosis and cord mass-effect.    Subjective: 3/22 A/O x4, negative S OB, negative DOE.  Still mild pruritus under foam dressing on neck.     Assessment & Plan:   Principal Problem:   Osteomyelitis of C6-7 Active Problems:   Injection of illicit drug within last 12 months   Syncope   Abscess in epidural space of cervical spine   Opioid dependence, daily use (HCC)   Chronic hepatitis C without hepatic coma (HCC)   Severe sepsis (HCC)   Sepsis due to discitis/osteomyelitis of C6-7: -Anterior cervical discectomy with interbody fusion by Dr. Trenton Gammon on 12/22/2019. -Blood cultures show coag negative staph 1 out of 2 bottles likely contaminant. -Repeated blood cultures on 12/20/2019 have remained negative till date. -Patient will continue IV vancomycin Rocephin and rifampin a minimum of 6 weeks Started 01/18/2020.  (Stop date February 02, 2020) -3/19 patient's surgical site was redressed after becoming wet during showering, no warmth to touch Steri-Strips appear intact underneath however patient complaining of pruritus.  If still pruritic in the a.m. will contact surgery about either fully redressing surgical site or removing Steri-Strips. -3/22 discontinue sponge dressing and Steri-Strips.  Ensure patient's incision site covered with Tegaderm when bathing  recurrent syncope: -Echo was unremarkable EKG no significant findings question due to drug use in the setting of sepsis.  Injection of illicit drug  within last 12 months: -He relates he went to detox about 4 weeks prior to admission -On admission no urine tox screen obtained  Normocytic anemia: -Likely due to anemia of chronic disease. -We will need follow-up as an outpatient.  Constipation: -Resolved with MiraLAX.  Tinea cruris -Miconazole BID   DVT prophylaxis: SCD Code Status: Full Family Communication:  Disposition Plan: Patient will have to remain in hospital or SNF until completion of all IV antibiotics.  IV drug addict.   Consultants:  Neurosurgery  ID   Procedures/Significant Events:    I have personally reviewed and interpreted all radiology studies and my findings are as above.  VENTILATOR SETTINGS:    Cultures   Antimicrobials: Anti-infectives (From admission, onward)   Start     Dose/Rate Stop   01/13/20 1600  vancomycin (VANCOCIN) IVPB 1000 mg/200 mL premix     1,000 mg 200 mL/hr over 60 Minutes 02/03/20 0359   01/13/20 1446  vancomycin (VANCOCIN) IVPB 1000 mg/200 mL premix  Status:  Discontinued     1,000 mg 200 mL/hr over 60 Minutes 01/13/20 1447   01/11/20 1400  vancomycin (VANCOREADY) IVPB 750 mg/150 mL  Status:  Discontinued     750 mg 150 mL/hr over 60 Minutes 01/13/20 1446   12/31/19 1400  vancomycin (VANCOCIN) IVPB 1000 mg/200 mL premix  Status:  Discontinued     1,000 mg 200 mL/hr over 60 Minutes 01/11/20 0223   12/26/19 0800  rifampin (RIFADIN) capsule 300 mg     300 mg 02/02/20 2359   12/22/19 1938  bacitracin 50,000 Units in sodium chloride 0.9 %  500 mL irrigation  Status:  Discontinued      12/22/19 2048   12/22/19 1821  ceFAZolin (ANCEF) 2-4 GM/100ML-% IVPB    Note to Pharmacy: [place]   : cabinet override    12/23/19 0629   12/22/19 1000  vancomycin (VANCOREADY) IVPB 1250 mg/250 mL  Status:  Discontinued     1,250 mg 166.7 mL/hr over 90 Minutes 12/31/19 0339   12/20/19 0600  vancomycin (VANCOREADY) IVPB 1750 mg/350 mL  Status:  Discontinued     1,750 mg 175 mL/hr  over 120 Minutes 12/21/19 2301   12/20/19 0030  cefTRIAXone (ROCEPHIN) 2 g in sodium chloride 0.9 % 100 mL IVPB     2 g 200 mL/hr over 30 Minutes 02/02/20 2359       Devices    LINES / TUBES:      Continuous Infusions: . cefTRIAXone (ROCEPHIN)  IV 2 g (01/18/20 0019)  . lactated ringers 200 mL/hr at 12/22/19 2004  . vancomycin 1,000 mg (01/18/20 1700)     Objective: Vitals:   01/17/20 2310 01/18/20 0308 01/18/20 0803 01/18/20 1543  BP: 122/81 116/76 117/81 106/67  Pulse: 66 74 91 85  Resp:   20 20  Temp: 98.1 F (36.7 C) 97.8 F (36.6 C) 97.6 F (36.4 C) 97.8 F (36.6 C)  TempSrc: Oral Oral Oral   SpO2: 100% 99% 98% 100%  Weight:      Height:        Intake/Output Summary (Last 24 hours) at 01/18/2020 1857 Last data filed at 01/18/2020 1100 Gross per 24 hour  Intake 720 ml  Output --  Net 720 ml   Filed Weights   12/20/19 0135  Weight: 71.5 kg   Physical Exam:  General: A/O x4, no acute respiratory distress Eyes: negative scleral hemorrhage, negative anisocoria, negative icterus ENT: Negative Runny nose, negative gingival bleeding, Neck:  Negative scars, masses, torticollis, lymphadenopathy, JVD, RIGHT lower neck surgical dressing in place clean negative sign of infection Lungs: Clear to auscultation bilaterally without wheezes or crackles Cardiovascular: Regular rate and rhythm without murmur gallop or rub normal S1 and S2 Abdomen: negative abdominal pain, nondistended, positive soft, bowel sounds, no rebound, no ascites, no appreciable mass Extremities: No significant cyanosis, clubbing, or edema bilateral lower extremities Skin: Negative rashes, lesions, ulcers Psychiatric:  Negative depression, negative anxiety, negative fatigue, negative mania  Central nervous system:  Cranial nerves II through XII intact, tongue/uvula midline, all extremities muscle strength 5/5, sensation intact throughout, negative dysarthria, negative expressive aphasia, negative  receptive aphasia.      Data Reviewed: Care during the described time interval was provided by me .  I have reviewed this patient's available data, including medical history, events of note, physical examination, and all test results as part of my evaluation.  CBC: Recent Labs  Lab 01/18/20 0940  WBC 3.1*  NEUTROABS 1.4*  HGB 12.6*  HCT 38.4*  MCV 82.4  PLT 153   Basic Metabolic Panel: Recent Labs  Lab 01/14/20 0500 01/17/20 0500 01/18/20 0940  NA 140 137 138  K 3.8 3.7 3.5  CL 103 104 104  CO2 25 24 26   GLUCOSE 87 85 152*  BUN 17 15 13   CREATININE 0.91 0.87 1.07  CALCIUM 9.5 9.1 9.2  MG  --   --  1.6*  PHOS  --   --  4.0   GFR: Estimated Creatinine Clearance: 97.4 mL/min (by C-G formula based on SCr of 1.07 mg/dL). Liver Function Tests: Recent Labs  Lab 01/18/20 0940  AST 25  ALT 37  ALKPHOS 87  BILITOT 0.2*  PROT 6.5  ALBUMIN 3.6   No results for input(s): LIPASE, AMYLASE in the last 168 hours. No results for input(s): AMMONIA in the last 168 hours. Coagulation Profile: No results for input(s): INR, PROTIME in the last 168 hours. Cardiac Enzymes: No results for input(s): CKTOTAL, CKMB, CKMBINDEX, TROPONINI in the last 168 hours. BNP (last 3 results) No results for input(s): PROBNP in the last 8760 hours. HbA1C: No results for input(s): HGBA1C in the last 72 hours. CBG: No results for input(s): GLUCAP in the last 168 hours. Lipid Profile: No results for input(s): CHOL, HDL, LDLCALC, TRIG, CHOLHDL, LDLDIRECT in the last 72 hours. Thyroid Function Tests: No results for input(s): TSH, T4TOTAL, FREET4, T3FREE, THYROIDAB in the last 72 hours. Anemia Panel: No results for input(s): VITAMINB12, FOLATE, FERRITIN, TIBC, IRON, RETICCTPCT in the last 72 hours. Sepsis Labs: No results for input(s): PROCALCITON, LATICACIDVEN in the last 168 hours.  No results found for this or any previous visit (from the past 240 hour(s)).       Radiology Studies: No  results found.      Scheduled Meds: . Chlorhexidine Gluconate Cloth  6 each Topical Daily  . gabapentin  100 mg Oral TID  . miconazole nitrate   Topical BID  . nicotine  14 mg Transdermal Daily  . rifampin  300 mg Oral BID WC  . sodium chloride flush  10-40 mL Intracatheter Q12H   Continuous Infusions: . cefTRIAXone (ROCEPHIN)  IV 2 g (01/18/20 0019)  . lactated ringers 200 mL/hr at 12/22/19 2004  . vancomycin 1,000 mg (01/18/20 1700)     LOS: 30 days    Time spent:40 min    Arthurine Oleary, Roselind Messier, MD [facility] Pager [phone]  If 7PM-7AM, please contact night-coverage [email or web address] Password [facility] 01/18/2020, 6:57 PM

## 2020-01-19 NOTE — Progress Notes (Signed)
Occupational Therapy Treatment Patient Details Name: Michael Lester MRN: [id number] DOB: 1986-09-26 Today's Date: 01/19/2020    History of present illness Michael Lester is a 34 y.o. male with medical history significant for IV heroin abuse, now presenting to the emergency department after he was found unresponsive and complaining of headache and neck pain.  Patient reports 1 to 2 weeks of pain near the base of his neck that radiates to the right shoulder and has been associated with numbness involving the right second and third fingers. Pt now s/p C5-7 ACDF due to osteomyeltis discitis with epidural abscess.   OT comments  Pt continues to progress towards OT goals including completion of UE HEP. Issued/upgraded to level 3 theraband today with pt trialling HEP with good carryover and pt appreciative of increased challenge. Continued review of general cervical precautions completed during session. Pt reports he is completing daily ADL and mobility ad lib without difficulty, demonstrating functional transfers independently this session. Questions answered throughout; feel pt has met acute OT goals with no further OT needs at this time. Dicussed with pt and he is in agreement. Pt reports he has been completing UE HEP on his own and encouraged to continue doing so. Acute OT to sign off at this time. Thank you.    Follow Up Recommendations  No OT follow up    Equipment Recommendations  None recommended by OT          Precautions / Restrictions Precautions Precautions: Cervical Precaution Booklet Issued: Yes (comment) Precaution Comments: reviewed in context of BADL Required Braces or Orthoses: Cervical Brace Cervical Brace: Soft collar Restrictions Weight Bearing Restrictions: No Other Position/Activity Restrictions: Cleared by Dr. Annette Stable for gentle UE exercises with theraband (cleared for level 3)       Mobility Bed Mobility               General bed mobility comments: OOB in  recliner  Transfers Overall transfer level: Independent                    Balance Overall balance assessment: No apparent balance deficits (not formally assessed)                                         ADL either performed or assessed with clinical judgement   ADL Overall ADL's : At baseline                                     Functional mobility during ADLs: Independent General ADL Comments: pt has been performing ADL without assistance including dressing and bathing, reports no concerns or difficulties at this time     Vision   Vision Assessment?: No apparent visual deficits   Perception     Praxis      Cognition Arousal/Alertness: Awake/alert Behavior During Therapy: WFL for tasks assessed/performed Overall Cognitive Status: Within Functional Limits for tasks assessed                                 General Comments: Very agreeable to therapy        Exercises Exercises: General Upper Extremity General Exercises - Upper Extremity Shoulder Flexion: AROM;Right;10 reps;Seated;Theraband(0-90*) Theraband Level (Shoulder Flexion): Level 3 (Green) Shoulder Horizontal ABduction:  AROM;Both;10 reps;Seated;Theraband Theraband Level (Shoulder Horizontal Abduction): Level 3 (Green) Shoulder Horizontal ADduction: AROM;Both;10 reps;Seated;Theraband Theraband Level (Shoulder Horizontal Adduction): Level 3 (Green) Elbow Flexion: AROM;Right;10 reps;Seated;Theraband Theraband Level (Elbow Flexion): Level 3 (Green) Elbow Extension: AROM;Right;10 reps;Seated;Theraband Theraband Level (Elbow Extension): Level 3 (Green) Other Exercises Other Exercises: scapular retract and hold x3 sec, x10 reps    Shoulder Instructions       General Comments      Pertinent Vitals/ Pain       Pain Assessment: Faces Faces Pain Scale: No hurt Pain Intervention(s): Monitored during session  Home Living                                           Prior Functioning/Environment              Frequency  Min 1X/week        Progress Toward Goals  OT Goals(current goals can now be found in the care plan section)  Progress towards OT goals: Goals met/education completed, patient discharged from OT  Acute Rehab OT Goals Patient Stated Goal: regain RUE strength OT Goal Formulation: With patient Time For Goal Achievement: 01/26/20 Potential to Achieve Goals: Good  Plan All goals met and education completed, patient discharged from OT services    Co-evaluation                 AM-PAC OT "6 Clicks" Daily Activity     Outcome Measure   Help from another person eating meals?: None Help from another person taking care of personal grooming?: None Help from another person toileting, which includes using toliet, bedpan, or urinal?: None Help from another person bathing (including washing, rinsing, drying)?: None Help from another person to put on and taking off regular upper body clothing?: None Help from another person to put on and taking off regular lower body clothing?: None 6 Click Score: 24    End of Session    OT Visit Diagnosis: Muscle weakness (generalized) (M62.81);Other symptoms and signs involving the nervous system (R29.898);Pain Pain - Right/Left: Right Pain - part of body: Arm   Activity Tolerance Patient tolerated treatment well   Patient Left in chair;with call bell/phone within reach   Nurse Communication Mobility status        Time: 1555-1610 OT Time Calculation (min): 15 min  Charges: OT General Charges $OT Visit: 1 Visit OT Treatments $Therapeutic Activity: 8-22 mins  Lou Cal, OT Acute Rehabilitation Services Pager [phone] Office [place] 01/19/2020, 5:00 PM

## 2020-01-19 NOTE — Progress Notes (Signed)
PROGRESS NOTE    Michael Lester  KGY:[id number] DOB: [birth year] DOA: 12/19/2019 PCP: Patient, [name]     Brief Narrative:  Michael Lester is an 34 y.o. WM PMHx IVDU   Presents to [facility] after found unresponsive complaining of headache, neck pain right shoulder pain, he was found to be septic with tachycardia and leukocytosis CT of the C-spine show some lucency in C6-C7 MRI of the spine showed discitis and osteomyelitis of C6-C7 and paravertebral edema resulting in phlegmon with with spinal stenosis and cord mass-effect.    Subjective: 3/23 A/O x4, negative S OB, negative DOE.  Incision site clean healing well   Assessment & Plan:   Principal Problem:   Osteomyelitis of C6-7 Active Problems:   Injection of illicit drug within last 12 months   Syncope   Abscess in epidural space of cervical spine   Opioid dependence, daily use (HCC)   Chronic hepatitis C without hepatic coma (HCC)   Severe sepsis (HCC)   Sepsis due to discitis/osteomyelitis of C6-7: -Anterior cervical discectomy with interbody fusion by Dr. Dutch Quint on 12/22/2019. -Blood cultures show coag negative staph 1 out of 2 bottles likely contaminant. -Repeated blood cultures on 12/20/2019 have remained negative till date. -Patient will continue IV vancomycin Rocephin and rifampin a minimum of 6 weeks Started 01/18/2020.  (Stop date February 02, 2020) -3/23 ordered removal of foam dressing and Steri-Strips overnight.  Incision site healed well negative sign of infection.  Patient order to cover with Tegaderm whenever he showers  recurrent syncope: -Echo was unremarkable EKG no significant findings question due to drug use in the setting of sepsis.  Injection of illicit drug within last 12 months: -He relates he went to detox about 4 weeks prior to admission -On admission no urine tox screen obtained  Normocytic anemia: -Likely due to anemia of chronic disease. -We will need follow-up as an  outpatient.  Constipation: -Resolved with MiraLAX.  Tinea cruris -Miconazole BID   DVT prophylaxis: SCD Code Status: Full Family Communication:  Disposition Plan: Patient will have to remain in hospital or SNF until completion of all IV antibiotics.  IV drug addict.   Consultants:  Neurosurgery  ID   Procedures/Significant Events:    I have personally reviewed and interpreted all radiology studies and my findings are as above.  VENTILATOR SETTINGS:    Cultures   Antimicrobials: Anti-infectives (From admission, onward)   Start     Dose/Rate Stop   01/13/20 1600  vancomycin (VANCOCIN) IVPB 1000 mg/200 mL premix     1,000 mg 200 mL/hr over 60 Minutes 02/03/20 0359   01/13/20 1446  vancomycin (VANCOCIN) IVPB 1000 mg/200 mL premix  Status:  Discontinued     1,000 mg 200 mL/hr over 60 Minutes 01/13/20 1447   01/11/20 1400  vancomycin (VANCOREADY) IVPB 750 mg/150 mL  Status:  Discontinued     750 mg 150 mL/hr over 60 Minutes 01/13/20 1446   12/31/19 1400  vancomycin (VANCOCIN) IVPB 1000 mg/200 mL premix  Status:  Discontinued     1,000 mg 200 mL/hr over 60 Minutes 01/11/20 0223   12/26/19 0800  rifampin (RIFADIN) capsule 300 mg     300 mg 02/02/20 2359   12/22/19 1938  bacitracin 50,000 Units in sodium chloride 0.9 % 500 mL irrigation  Status:  Discontinued      12/22/19 2048   12/22/19 1821  ceFAZolin (ANCEF) 2-4 GM/100ML-% IVPB    Note to Pharmacy: Little Ishikawa   : cabinet  override    12/23/19 0629   12/22/19 1000  vancomycin (VANCOREADY) IVPB 1250 mg/250 mL  Status:  Discontinued     1,250 mg 166.7 mL/hr over 90 Minutes 12/31/19 0339   12/20/19 0600  vancomycin (VANCOREADY) IVPB 1750 mg/350 mL  Status:  Discontinued     1,750 mg 175 mL/hr over 120 Minutes 12/21/19 2301   12/20/19 0030  cefTRIAXone (ROCEPHIN) 2 g in sodium chloride 0.9 % 100 mL IVPB     2 g 200 mL/hr over 30 Minutes 02/02/20 2359       Devices    LINES / TUBES:      Continuous  Infusions: . cefTRIAXone (ROCEPHIN)  IV 2 g (01/19/20 0008)  . lactated ringers 200 mL/hr at 12/22/19 2004  . vancomycin 1,000 mg (01/19/20 0430)     Objective: Vitals:   01/18/20 2018 01/18/20 2355 01/19/20 0439 01/19/20 1215  BP: 115/74 103/67 117/75 115/82  Pulse: 97 80 69 96  Resp:    20  Temp: 98.2 F (36.8 C) 98.2 F (36.8 C) 98.2 F (36.8 C) 97.9 F (36.6 C)  TempSrc: Oral Oral Oral Oral  SpO2: 100% 99% 100% 100%  Weight:      Height:        Intake/Output Summary (Last 24 hours) at 01/19/2020 1901 Last data filed at 01/19/2020 1700 Gross per 24 hour  Intake 950 ml  Output --  Net 950 ml   Filed Weights   12/20/19 0135  Weight: 71.5 kg   Physical Exam:  General: A/O x4, no acute respiratory distress Eyes: negative scleral hemorrhage, negative anisocoria, negative icterus ENT: Negative Runny nose, negative gingival bleeding, Neck:  Negative scars, masses, torticollis, lymphadenopathy, JVD, RIGHT lower neck surgical dressing in place clean negative sign of infection Lungs: Clear to auscultation bilaterally without wheezes or crackles Cardiovascular: Regular rate and rhythm without murmur gallop or rub normal S1 and S2 Abdomen: negative abdominal pain, nondistended, positive soft, bowel sounds, no rebound, no ascites, no appreciable mass Extremities: No significant cyanosis, clubbing, or edema bilateral lower extremities Skin: Negative rashes, lesions, ulcers Psychiatric:  Negative depression, negative anxiety, negative fatigue, negative mania  Central nervous system:  Cranial nerves II through XII intact, tongue/uvula midline, all extremities muscle strength 5/5, sensation intact throughout, negative dysarthria, negative expressive aphasia, negative receptive aphasia.      Data Reviewed: Care during the described time interval was provided by me .  I have reviewed this patient's available data, including medical history, events of note, physical examination, and  all test results as part of my evaluation.  CBC: Recent Labs  Lab 01/18/20 0940  WBC 3.1*  NEUTROABS 1.4*  HGB 12.6*  HCT 38.4*  MCV 82.4  PLT 153   Basic Metabolic Panel: Recent Labs  Lab 01/14/20 0500 01/17/20 0500 01/18/20 0940  NA 140 137 138  K 3.8 3.7 3.5  CL 103 104 104  CO2 25 24 26   GLUCOSE 87 85 152*  BUN 17 15 13   CREATININE 0.91 0.87 1.07  CALCIUM 9.5 9.1 9.2  MG  --   --  1.6*  PHOS  --   --  4.0   GFR: Estimated Creatinine Clearance: 97.4 mL/min (by C-G formula based on SCr of 1.07 mg/dL). Liver Function Tests: Recent Labs  Lab 01/18/20 0940  AST 25  ALT 37  ALKPHOS 87  BILITOT 0.2*  PROT 6.5  ALBUMIN 3.6   No results for input(s): LIPASE, AMYLASE in the last 168 hours. No results  for input(s): AMMONIA in the last 168 hours. Coagulation Profile: No results for input(s): INR, PROTIME in the last 168 hours. Cardiac Enzymes: No results for input(s): CKTOTAL, CKMB, CKMBINDEX, TROPONINI in the last 168 hours. BNP (last 3 results) No results for input(s): PROBNP in the last 8760 hours. HbA1C: No results for input(s): HGBA1C in the last 72 hours. CBG: No results for input(s): GLUCAP in the last 168 hours. Lipid Profile: No results for input(s): CHOL, HDL, LDLCALC, TRIG, CHOLHDL, LDLDIRECT in the last 72 hours. Thyroid Function Tests: No results for input(s): TSH, T4TOTAL, FREET4, T3FREE, THYROIDAB in the last 72 hours. Anemia Panel: No results for input(s): VITAMINB12, FOLATE, FERRITIN, TIBC, IRON, RETICCTPCT in the last 72 hours. Sepsis Labs: No results for input(s): PROCALCITON, LATICACIDVEN in the last 168 hours.  No results found for this or any previous visit (from the past 240 hour(s)).       Radiology Studies: No results found.      Scheduled Meds: . Chlorhexidine Gluconate Cloth  6 each Topical Daily  . gabapentin  100 mg Oral TID  . miconazole nitrate   Topical BID  . nicotine  14 mg Transdermal Daily  . rifampin  300  mg Oral BID WC  . sodium chloride flush  10-40 mL Intracatheter Q12H   Continuous Infusions: . cefTRIAXone (ROCEPHIN)  IV 2 g (01/19/20 0008)  . lactated ringers 200 mL/hr at 12/22/19 2004  . vancomycin 1,000 mg (01/19/20 0430)     LOS: 31 days    Time spent:40 min    Ohana Birdwell, Geraldo Docker, MD [facility] Pager [phone]  If 7PM-7AM, please contact night-coverage [email or web address] Password [facility] 01/19/2020, 7:01 PM

## 2020-01-19 NOTE — Plan of Care (Signed)

## 2020-01-20 LAB — CBC WITH DIFFERENTIAL/PLATELET
Abs Immature Granulocytes: 0.02 10*3/uL (ref 0.00–0.07)
Basophils Absolute: 0.1 10*3/uL (ref 0.0–0.1)
Basophils Relative: 1 %
Eosinophils Absolute: 0.3 10*3/uL (ref 0.0–0.5)
Eosinophils Relative: 7 %
HCT: 37.3 % — ABNORMAL LOW (ref 39.0–52.0)
Hemoglobin: 12.5 g/dL — ABNORMAL LOW (ref 13.0–17.0)
Immature Granulocytes: 1 %
Lymphocytes Relative: 45 %
Lymphs Abs: 1.7 10*3/uL (ref 0.7–4.0)
MCH: 27.1 pg (ref 26.0–34.0)
MCHC: 33.5 g/dL (ref 30.0–36.0)
MCV: 80.9 fL (ref 80.0–100.0)
Monocytes Absolute: 0.4 10*3/uL (ref 0.1–1.0)
Monocytes Relative: 10 %
Neutro Abs: 1.4 10*3/uL — ABNORMAL LOW (ref 1.7–7.7)
Neutrophils Relative %: 36 %
Platelets: 143 10*3/uL — ABNORMAL LOW (ref 150–400)
RBC: 4.61 MIL/uL (ref 4.22–5.81)
RDW: 15.4 % (ref 11.5–15.5)
WBC: 3.8 10*3/uL — ABNORMAL LOW (ref 4.0–10.5)
nRBC: 0 % (ref 0.0–0.2)

## 2020-01-20 LAB — COMPREHENSIVE METABOLIC PANEL
ALT: 59 U/L — ABNORMAL HIGH (ref 0–44)
AST: 33 U/L (ref 15–41)
Albumin: 3.6 g/dL (ref 3.5–5.0)
Alkaline Phosphatase: 93 U/L (ref 38–126)
Anion gap: 7 (ref 5–15)
BUN: 15 mg/dL (ref 6–20)
CO2: 27 mmol/L (ref 22–32)
Calcium: 9.3 mg/dL (ref 8.9–10.3)
Chloride: 105 mmol/L (ref 98–111)
Creatinine, Ser: 0.94 mg/dL (ref 0.61–1.24)
GFR calc Af Amer: >60 mL/min (ref >60)
GFR calc non Af Amer: >60 mL/min (ref >60)
Glucose, Bld: 108 mg/dL — ABNORMAL HIGH (ref 70–99)
Potassium: 4 mmol/L (ref 3.5–5.1)
Sodium: 139 mmol/L (ref 135–145)
Total Bilirubin: 0.5 mg/dL (ref 0.3–1.2)
Total Protein: 6.5 g/dL (ref 6.5–8.1)

## 2020-01-20 LAB — PHOSPHORUS: Phosphorus: 4.7 mg/dL — ABNORMAL HIGH (ref 2.5–4.6)

## 2020-01-20 LAB — VANCOMYCIN, TROUGH: Vancomycin Tr: 6 ug/mL — ABNORMAL LOW (ref 15–20)

## 2020-01-20 LAB — MAGNESIUM: Magnesium: 1.8 mg/dL (ref 1.7–2.4)

## 2020-01-20 LAB — VANCOMYCIN, PEAK: Vancomycin Pk: 18 ug/mL — ABNORMAL LOW (ref 30–40)

## 2020-01-20 NOTE — Progress Notes (Signed)
[facility] contacted by lab; pt has a Vanc trough due at 1530 before dose is administered at 1600. Pt has PICC. IVT consult placed for lab collect and unit RN, Cicero Duck notified to hold dose until lab can be obtained. [facility] RN to notify bedside nurse once lab collected.

## 2020-01-20 NOTE — Progress Notes (Signed)
Pharmacy Antibiotic Note  Michael Lester is a 34 y.o. male admitted on 12/19/2019 with osteomyelitis of the cervical spine. Pharmacy has been consulted for vancomycin dosing.  SCr fluctuating this admission but stable around 1. Scr today is 0.94.  Afebrile. WBC normal.  Vancomycin peak this AM was 18 (Collected about 1 hour late) and trough this afternoon 6. This results an AUC of 306, which is below our goal of 400-550.   Upon further observation it appears that Michael Lester may have missed a dose of vancomycin yesterday afternoon when he was being transferred from 4NP08 to 5N. Given this information would expect low levels.   Plan: Continue vancomycin to 1000 mg IV q12h - Low levels likely reflective of missed dose yesterday and patient has been stable on 1 gm q 12  Consider re-check levels later this week or over the weekend Tentative stop date for all antibiotics: 02/02/20 Monitor clinical progress, renal function, vanc levels as clinically indicated.   Height: 6\' 1"  (185.4 cm) Weight: 157 lb 10.1 oz (71.5 kg) IBW/kg (Calculated) : 79.9  Temp (24hrs), Avg:98 F (36.7 C), Min:97.5 F (36.4 C), Max:98.4 F (36.9 C)  Recent Labs  Lab 01/14/20 0500 01/17/20 0500 01/18/20 0940 01/20/20 0420 01/20/20 0818 01/20/20 1530  WBC  --   --  3.1* 3.8*  --   --   CREATININE 0.91 0.87 1.07 0.94  --   --   VANCOTROUGH  --   --   --   --   --  6*  VANCOPEAK  --   --   --   --  18*  --     Estimated Creatinine Clearance: 110.9 mL/min (by C-G formula based on SCr of 0.94 mg/dL).    Allergies  Allergen Reactions  . Shellfish Allergy Anaphylaxis    Antimicrobials this admission: Vanc 2/20 >> CTX 2/21 >> Rifampin 2/27 >> Cefepime/Flagyl x1 2/20  Dose adjustments this admission: Vanc 1250 mg q12h (2/26) >> 1g q12h (3/4) >> 750mg  q12h (3/14) >> 1000 mg q12h (3/17) >>   Thank you for allowing pharmacy to be a part of this patient's care. 06-08-1984, PharmD, BCPS, BCIDP Infectious  Diseases Clinical Pharmacist Phone: [phone] 01/20/2020 4:45 PM

## 2020-01-20 NOTE — Progress Notes (Signed)
PROGRESS NOTE    Michael Lester  PIR:[id number] DOB: 1986-01-19 DOA: 12/19/2019 PCP: Patient, [name]   Brief Narrative: Michael Lester an 34 y.o.WM PMHx IVDU   Presents to [facility] after found unresponsive complaining of headache, neck pain right shoulder pain, he was found to be septic with tachycardia and leukocytosis CT of the C-spine show some lucency in C6-C7 MRI of the spine showed discitis and osteomyelitis of C6-C7 and paravertebral edema resulting in phlegmon with with spinal stenosis and cord mass-effect.   Assessment & Plan:   Principal Problem:   Osteomyelitis of C6-7 Active Problems:   Injection of illicit drug within last 12 months   Syncope   Abscess in epidural space of cervical spine   Opioid dependence, daily use (HCC)   Chronic hepatitis C without hepatic coma (HCC)   Severe sepsis (HCC)  Sepsis due to discitis/osteomyelitis of C6-7: -Anterior cervical discectomy with interbody fusion by Dr. Trenton Gammon on 12/22/2019. -Blood cultures show coag negative staph 1 out of 2 bottles likely contaminant. -Repeated blood cultures on 12/20/2019 have remained negative till date. -Patient will continue IV vancomycin Rocephin and rifampin Michael Lester minimum of 6 weeks Started 01/18/2020.  (Stop date February 02, 2020) -3/23 ordered removal of foam dressing and Steri-Strips overnight.  Incision site healed well negative sign of infection.  Patient order to cover with Tegaderm whenever he showers  recurrent syncope: -Echo was unremarkable EKG no significant findings question due to drug use in the setting of sepsis.  Injection of illicit drug within last 12 months: -He relates he went to detox about 4 weeks prior to admission -On admission no urine tox screen obtained  Normocytic anemia: -Likely due to anemia of chronic disease. -We will need follow-up as an outpatient.  Constipation: -Resolved with MiraLAX.  Tinea cruris -Miconazole BID  DVT prophylaxis: SCD Code Status: full   Family Communication: none at bedside Disposition Plan:  . Patient came from: home            . Anticipated d/c place:home . Barriers to d/c OR conditions which need to be met to effect Michael Lester safe d/c: pending completion of IV abx   Consultants:   ID  Neurosurgery  Procedures:  3/23 C5-6, C6-7 anterior cervical discectomy with interbody fusion utilizing interbody allograft wedges and anterior plate instrumentation.  Antimicrobials:  Anti-infectives (From admission, onward)   Start     Dose/Rate Route Frequency Ordered Stop   01/13/20 1600  vancomycin (VANCOCIN) IVPB 1000 mg/200 mL premix     1,000 mg 200 mL/hr over 60 Minutes Intravenous Every 12 hours 01/13/20 1447 02/03/20 0359   01/13/20 1446  vancomycin (VANCOCIN) IVPB 1000 mg/200 mL premix  Status:  Discontinued     1,000 mg 200 mL/hr over 60 Minutes Intravenous Every 12 hours 01/13/20 1446 01/13/20 1447   01/11/20 1400  vancomycin (VANCOREADY) IVPB 750 mg/150 mL  Status:  Discontinued     750 mg 150 mL/hr over 60 Minutes Intravenous Every 12 hours 01/11/20 0223 01/13/20 1446   12/31/19 1400  vancomycin (VANCOCIN) IVPB 1000 mg/200 mL premix  Status:  Discontinued     1,000 mg 200 mL/hr over 60 Minutes Intravenous Every 12 hours 12/31/19 1214 01/11/20 0223   12/26/19 0800  rifampin (RIFADIN) capsule 300 mg     300 mg Oral 2 times daily with meals 12/25/19 1037 02/02/20 2359   12/22/19 1938  bacitracin 50,000 Units in sodium chloride 0.9 % 500 mL irrigation  Status:  Discontinued  As needed 12/22/19 1939 12/22/19 2048   12/22/19 1821  ceFAZolin (ANCEF) 2-4 GM/100ML-% IVPB    Note to Pharmacy: Michael Lester   : cabinet override      12/22/19 1821 12/23/19 0629   12/22/19 1000  vancomycin (VANCOREADY) IVPB 1250 mg/250 mL  Status:  Discontinued     1,250 mg 166.7 mL/hr over 90 Minutes Intravenous Every 12 hours 12/21/19 2301 12/31/19 0339   12/20/19 0600  vancomycin (VANCOREADY) IVPB 1750 mg/350 mL  Status:  Discontinued      1,750 mg 175 mL/hr over 120 Minutes Intravenous Every 12 hours 12/20/19 0042 12/21/19 2301   12/20/19 0030  cefTRIAXone (ROCEPHIN) 2 g in sodium chloride 0.9 % 100 mL IVPB     2 g 200 mL/hr over 30 Minutes Intravenous Every 24 hours 12/20/19 0027 02/02/20 2359     Subjective: C/o improving neck pain No other complaints  Objective: Vitals:   01/19/20 2103 01/20/20 0500 01/20/20 0759 01/20/20 1434  BP: 122/72 134/72 116/83 102/75  Pulse: 98 86 91 92  Resp:  _0 Temp: (!) 97.5 F (36.4 C) 98.1 F (36.7 C) 97.8 F (36.6 C) 98.4 F (36.9 C)  TempSrc: Oral Oral Oral Oral  SpO2: 100% 100% 100% 100%  Weight:      Height:        Intake/Output Summary (Last 24 hours) at 01/20/2020 1453 Last data filed at 01/20/2020 1300 Gross per 24 hour  Intake 1080 ml  Output --  Net 1080 ml   Filed Weights   12/20/19 0135  Weight: 71.5 kg    Examination:  General exam: Appears calm and comfortable  Respiratory system: Clear to auscultation. Respiratory effort normal. Cardiovascular system: S1 & S2 heard, RRR. Gastrointestinal system: Abdomen is nondistended, soft and nontender. Central nervous system: Alert and oriented. No focal neurological deficits. Extremities: no LEE Skin: No rashes, lesions or ulcers Psychiatry: Judgement and insight appear normal. Mood & affect appropriate.     Data Reviewed: I have personally reviewed following labs and imaging studies  CBC: Recent Labs  Lab 01/18/20 0940 01/20/20 0420  WBC 3.1* 3.8*  NEUTROABS 1.4* 1.4*  HGB 12.6* 12.5*  HCT 38.4* 37.3*  MCV 82.4 80.9  PLT 153 741*   Basic Metabolic Panel: Recent Labs  Lab 01/14/20 0500 01/17/20 0500 01/18/20 0940 01/20/20 0420  NA 140 137 138 139  K 3.8 3.7 3.5 4.0  CL 103 104 104 105  CO2 _1 GLUCOSE 87 85 152* 108*  BUN _2 CREATININE 0.91 0.87 1.07 0.94  CALCIUM 9.5 9.1 9.2 9.3  MG  --   --  1.6* 1.8  PHOS  --   --  4.0 4.7*   GFR: Estimated  Creatinine Clearance: 110.9 mL/min (by C-G formula based on SCr of 0.94 mg/dL). Liver Function Tests: Recent Labs  Lab 01/18/20 0940 01/20/20 0420  AST 25 33  ALT 37 59*  ALKPHOS 87 93  BILITOT 0.2* 0.5  PROT 6.5 6.5  ALBUMIN 3.6 3.6   No results for input(s): LIPASE, AMYLASE in the last 168 hours. No results for input(s): AMMONIA in the last 168 hours. Coagulation Profile: No results for input(s): INR, PROTIME in the last 168 hours. Cardiac Enzymes: No results for input(s): CKTOTAL, CKMB, CKMBINDEX, TROPONINI in the last 168 hours. BNP (last 3 results) No results for input(s): PROBNP in the last 8760 hours. HbA1C: No results for input(s): HGBA1C in the last 72 hours.  CBG: No results for input(s): GLUCAP in the last 168 hours. Lipid Profile: No results for input(s): CHOL, HDL, LDLCALC, TRIG, CHOLHDL, LDLDIRECT in the last 72 hours. Thyroid Function Tests: No results for input(s): TSH, T4TOTAL, FREET4, T3FREE, THYROIDAB in the last 72 hours. Anemia Panel: No results for input(s): VITAMINB12, FOLATE, FERRITIN, TIBC, IRON, RETICCTPCT in the last 72 hours. Sepsis Labs: No results for input(s): PROCALCITON, LATICACIDVEN in the last 168 hours.  No results found for this or any previous visit (from the past 240 hour(s)).       Radiology Studies: No results found.      Scheduled Meds: . Chlorhexidine Gluconate Cloth  6 each Topical Daily  . gabapentin  100 mg Oral TID  . miconazole nitrate   Topical BID  . nicotine  14 mg Transdermal Daily  . rifampin  300 mg Oral BID WC  . sodium chloride flush  10-40 mL Intracatheter Q12H   Continuous Infusions: . cefTRIAXone (ROCEPHIN)  IV 2 g (01/20/20 0058)  . lactated ringers 200 mL/hr at 12/22/19 2004  . vancomycin 1,000 mg (01/20/20 0456)     LOS: 32 days    Time spent: over 30 min    Caldwell Powell, MD [facility]   To contact the attending provider between 7A-7P or the covering provider during after  hours 7P-7A, please log into the web site [email or web address] and access using universal [place] password for that web site. If you do not have the password, please call the hospital operator.  01/20/2020, 2:53 PM    

## 2020-01-21 DIAGNOSIS — R768 Other specified abnormal immunological findings in serum: Secondary | ICD-10-CM

## 2020-01-21 MED ORDER — IBUPROFEN 200 MG PO TABS
400.0000 mg | ORAL_TABLET | Freq: Three times a day (TID) | ORAL | Status: DC
  Administered 2020-01-21 – 2020-01-25 (×14): 400 mg via ORAL
  Filled 2020-01-21 (×14): qty 2

## 2020-01-21 MED ORDER — ACETAMINOPHEN 325 MG PO TABS: 650.0000 mg | ORAL_TABLET | Freq: Four times a day (QID) | ORAL | Status: DC | PRN

## 2020-01-21 NOTE — Progress Notes (Deleted)
Patient is back to the room from surgery, she is alert oriented x 3 but somnolent. Vital signs within normal range and dressing intact. abductor wedge in place at this time and ice pack applied to the site. Will continue to monitor patient.

## 2020-01-21 NOTE — Progress Notes (Signed)
[facility] for Infectious Disease  Date of Admission:  12/19/2019      Total days of antibiotics 30   Rifampin   Vancomycin  Ceftriaxone    ASSESSMENT: Michael Lester is a 34 y.o. male with cervical spine epidural abscess s/p evacuation and hardware stabilization on continued treatment for culture negative infection with Vancomycin + Rifampin + Ceftiraxone. Anterior neck incision looks good and no signs of infection. Continue as planned with last day of antibiotic therapy April 6th.   With hardware in place would give him 30-day supply of Doxycycline 100 mg PO BID at discharge (can arrange TOC to bring med to bed prior to D/C on April 6th).   Sed Rate (mm/hr)  Date Value  12/21/2019 35 (H)   CRP (mg/dL)  Date Value  12/21/2019 2.8 (H)   (+) Hepatitis C Ab screen - very low level Hep C RNA (60 and repeated now 330). ?clearing infection.  Will re-assess at outpatient follow up need for treatment. . No previous records available to determine chronicity. I discussed this with him today and answered all questions and provided precautions for home including avoiding sharing items that could contain blood (toothbrush, razors, nail clippers, etc) and barrier method for intercourse until repeat evaluation.    PLAN: 1. No changes to antimicrobials - continue through April 6th  2. Doxycyline BID at discharge  3. Follow up arranged in ID clinic for April 28th  4. Will need reassessment of Hepatitis C infection     Principal Problem:   Osteomyelitis of C6-7 Active Problems:   Abscess in epidural space of cervical spine   Hepatitis C antibody positive in blood   Injection of illicit drug within last 12 months   Syncope   Opioid dependence, daily use (HCC)   Chronic hepatitis C without hepatic coma (HCC)   Severe sepsis ([place])   . Chlorhexidine Gluconate Cloth  6 each Topical Daily  . gabapentin  100 mg Oral TID  . ibuprofen  400 mg Oral TID  . miconazole nitrate    Topical BID  . nicotine  14 mg Transdermal Daily  . rifampin  300 mg Oral BID WC  . sodium chloride flush  10-40 mL Intracatheter Q12H    SUBJECTIVE: Feeling better than when I saw him a few weeks ago. Does have some pain early in the AM and at night; he is trying to decrease his pain medication as he does not want to be dependent on anything any longer. Does still have numbness to the right pointer finger that he has not noticed return yet.   Was showering recently and got some water under anterior honeycomb dressing. Noticed some itching thereafter that was bothersome. No drainage or trouble with the incision from his report but has continued to keep a clean/dry honeycomb dressing in place now.   He has 16 days left in therapy for IV treatment of his infection. He is excited to go home to see his little boy again soon.    Review of Systems: Review of Systems  Constitutional: Negative for chills and fever.  HENT: Negative for hearing loss.   Respiratory: Negative for cough and shortness of breath.   Cardiovascular: Negative for chest pain and leg swelling.  Gastrointestinal: Negative for abdominal pain, diarrhea and vomiting.  Genitourinary: Negative for dysuria.  Musculoskeletal: Positive for neck pain.  Skin: Negative for rash.  Neurological: Negative for dizziness.    Allergies  Allergen  Reactions  . Shellfish Allergy Anaphylaxis    OBJECTIVE: Vitals:   01/20/20 1434 01/20/20 1926 01/21/20 0332 01/21/20 0802  BP: 102/75 129/82 118/75 107/74  Pulse: 92 89 90 78  Resp: 19 18 18 16   Temp: 98.4 F (36.9 C) 97.7 F (36.5 C) 97.9 F (36.6 C) 97.8 F (36.6 C)  TempSrc: Oral Oral Oral Oral  SpO2: 100% 100% 99% 99%  Weight:      Height:       Body mass index is 20.8 kg/m.  Physical Exam Constitutional:      Appearance: Normal appearance.     Comments: Sitting in bed comfortably talking with dietary team.   HENT:     Mouth/Throat:     Mouth: Mucous membranes are  moist.     Pharynx: Oropharynx is clear.  Eyes:     Pupils: Pupils are equal, round, and reactive to light.  Neck:     Comments: Anterior honeycomb dressing is clean and dry. Tegaderm intact. Underlying steri strips in place still. Unable to visualize well.  Cardiovascular:     Rate and Rhythm: Normal rate and regular rhythm.     Heart sounds: No murmur.  Pulmonary:     Effort: Pulmonary effort is normal.     Breath sounds: Normal breath sounds.  Abdominal:     General: Abdomen is flat. There is no distension.  Musculoskeletal:        General: Normal range of motion.  Skin:    General: Skin is warm and dry.     Capillary Refill: Capillary refill takes less than 2 seconds.  Neurological:     Mental Status: He is alert and oriented to person, place, and time.  Psychiatric:        Mood and Affect: Mood normal.        Behavior: Behavior normal.   RUE PICC line - clean/dry dressing. Insertion site w/o erythema, tenderness, drainage, cording or distal swelling of affected extremity    Lab Results Lab Results  Component Value Date   WBC 3.8 (L) 01/20/2020   HGB 12.5 (L) 01/20/2020   HCT 37.3 (L) 01/20/2020   MCV 80.9 01/20/2020   PLT 143 (L) 01/20/2020    Lab Results  Component Value Date   CREATININE 0.94 01/20/2020   BUN 15 01/20/2020   NA 139 01/20/2020   K 4.0 01/20/2020   CL 105 01/20/2020   CO2 27 01/20/2020    Lab Results  Component Value Date   ALT 59 (H) 01/20/2020   AST 33 01/20/2020   ALKPHOS 93 01/20/2020   BILITOT 0.5 01/20/2020     Microbiology: No results found for this or any previous visit (from the past 240 hour(s)).   01/22/2020, MSN, NP-C [facility]  [email or web address] Pager: [phone] Office: [phone] [facility] Main Line: [phone]

## 2020-01-21 NOTE — Progress Notes (Addendum)
PROGRESS NOTE    Michael Lester  PTW:[id number] DOB: 1985-12-06 DOA: 12/19/2019 PCP: Patient, [name]   Brief Narrative: Michael Lester an 34 y.o.WM PMHx IVDU   Presents to [facility] after found unresponsive complaining of headache, neck pain right shoulder pain, he was found to be septic with tachycardia and leukocytosis CT of the C-spine show some lucency in C6-C7 MRI of the spine showed discitis and osteomyelitis of C6-C7 and paravertebral edema resulting in phlegmon with with spinal stenosis and cord mass-effect.   Assessment & Plan:   Principal Problem:   Osteomyelitis of C6-7 Active Problems:   Injection of illicit drug within last 12 months   Syncope   Abscess in epidural space of cervical spine   Opioid dependence, daily use (HCC)   Chronic hepatitis C without hepatic coma (HCC)   Severe sepsis (HCC)   Hepatitis C antibody positive in blood  Sepsis due to discitis/osteomyelitis of C6-7: -Anterior cervical discectomy with interbody fusion by Dr. Trenton Gammon on 12/22/2019. -Blood cultures show coag negative staph 1 out of 2 bottles likely contaminant. -Repeated blood cultures on 12/20/2019 have remained negative till date. -Patient will continue IV vancomycin Rocephin and rifampin Michael Lester minimum of 6 weeks Started 01/18/2020.  (Stop date February 02, 2020) -3/23 ordered removal of foam dressing and Steri-Strips overnight.  Incision site healed well negative sign of infection.  Patient order to cover with Tegaderm whenever he showers - schedule ibuprofen for pain, continue norco prn - ID recommending IV abx until April 6, then discharge on doxycycline BID - he's to follow up in ID clinic on April 28th  Hepatitis C: follow with ID outpatient  recurrent syncope: -Echo was unremarkable EKG no significant findings question due to drug use in the setting of sepsis.  Injection of illicit drug within last 12 months: -He relates he went to detox about 4 weeks prior to admission -On admission no  urine tox screen obtained  Normocytic anemia: -Likely due to anemia of chronic disease. -We will need follow-up as an outpatient.  Constipation: -Resolved with MiraLAX.  Tinea cruris -Miconazole BID  DVT prophylaxis: SCD Code Status: full  Family Communication: none at bedside Disposition Plan:  . Patient came from: home            . Anticipated d/c place:home . Barriers to d/c OR conditions which need to be met to effect Michael Lester safe d/c: pending completion of IV abx   Consultants:   ID  Neurosurgery  Procedures:  3/23 C5-6, C6-7 anterior cervical discectomy with interbody fusion utilizing interbody allograft wedges and anterior plate instrumentation.  Antimicrobials:  Anti-infectives (From admission, onward)   Start     Dose/Rate Route Frequency Ordered Stop   01/13/20 1600  vancomycin (VANCOCIN) IVPB 1000 mg/200 mL premix     1,000 mg 200 mL/hr over 60 Minutes Intravenous Every 12 hours 01/13/20 1447 02/03/20 0359   01/13/20 1446  vancomycin (VANCOCIN) IVPB 1000 mg/200 mL premix  Status:  Discontinued     1,000 mg 200 mL/hr over 60 Minutes Intravenous Every 12 hours 01/13/20 1446 01/13/20 1447   01/11/20 1400  vancomycin (VANCOREADY) IVPB 750 mg/150 mL  Status:  Discontinued     750 mg 150 mL/hr over 60 Minutes Intravenous Every 12 hours 01/11/20 0223 01/13/20 1446   12/31/19 1400  vancomycin (VANCOCIN) IVPB 1000 mg/200 mL premix  Status:  Discontinued     1,000 mg 200 mL/hr over 60 Minutes Intravenous Every 12 hours 12/31/19 1214 01/11/20 0223  12/26/19 0800  rifampin (RIFADIN) capsule 300 mg     300 mg Oral 2 times daily with meals 12/25/19 1037 02/02/20 2359   12/22/19 1938  bacitracin 50,000 Units in sodium chloride 0.9 % 500 mL irrigation  Status:  Discontinued       As needed 12/22/19 1939 12/22/19 2048   12/22/19 1821  ceFAZolin (ANCEF) 2-4 GM/100ML-% IVPB    Note to Pharmacy: Michael Lester   : cabinet override      12/22/19 1821 12/23/19 0629   12/22/19  1000  vancomycin (VANCOREADY) IVPB 1250 mg/250 mL  Status:  Discontinued     1,250 mg 166.7 mL/hr over 90 Minutes Intravenous Every 12 hours 12/21/19 2301 12/31/19 0339   12/20/19 0600  vancomycin (VANCOREADY) IVPB 1750 mg/350 mL  Status:  Discontinued     1,750 mg 175 mL/hr over 120 Minutes Intravenous Every 12 hours 12/20/19 0042 12/21/19 2301   12/20/19 0030  cefTRIAXone (ROCEPHIN) 2 g in sodium chloride 0.9 % 100 mL IVPB     2 g 200 mL/hr over 30 Minutes Intravenous Every 24 hours 12/20/19 0027 02/02/20 2359     Subjective: Asking for something different for pain  Objective: Vitals:   01/20/20 1926 01/21/20 0332 01/21/20 0802 01/21/20 1256  BP: 129/82 118/75 107/74 113/79  Pulse: 89 90 78 87  Resp: _0 Temp: 97.7 F (36.5 C) 97.9 F (36.6 C) 97.8 F (36.6 C) 98.6 F (37 C)  TempSrc: Oral Oral Oral Oral  SpO2: 100% 99% 99% 100%  Weight:      Height:        Intake/Output Summary (Last 24 hours) at 01/21/2020 1604 Last data filed at 01/21/2020 1300 Gross per 24 hour  Intake 240 ml  Output --  Net 240 ml   Filed Weights   12/20/19 0135  Weight: 71.5 kg    Examination:  General: No acute distress. Cardiovascular: Heart sounds show Michael Lester regular rate, and rhythm. Lungs: Clear to auscultation bilaterally  Abdomen: Soft, nontender, nondistended  Neurological: Alert and oriented 3. Moves all extremities 4 . Cranial nerves II through XII grossly intact. Skin: Warm and dry. No rashes or lesions. Extremities: No clubbing or cyanosis. No edema. .  Data Reviewed: I have personally reviewed following labs and imaging studies  CBC: Recent Labs  Lab 01/18/20 0940 01/20/20 0420  WBC 3.1* 3.8*  NEUTROABS 1.4* 1.4*  HGB 12.6* 12.5*  HCT 38.4* 37.3*  MCV 82.4 80.9  PLT 153 024*   Basic Metabolic Panel: Recent Labs  Lab 01/17/20 0500 01/18/20 0940 01/20/20 0420  NA 137 138 139  K 3.7 3.5 4.0  CL 104 104 105  CO2 _1 GLUCOSE 85 152* 108*  BUN _2 CREATININE 0.87 1.07 0.94  CALCIUM 9.1 9.2 9.3  MG  --  1.6* 1.8  PHOS  --  4.0 4.7*   GFR: Estimated Creatinine Clearance: 110.9 mL/min (by C-G formula based on SCr of 0.94 mg/dL). Liver Function Tests: Recent Labs  Lab 01/18/20 0940 01/20/20 0420  AST 25 33  ALT 37 59*  ALKPHOS 87 93  BILITOT 0.2* 0.5  PROT 6.5 6.5  ALBUMIN 3.6 3.6   No results for input(s): LIPASE, AMYLASE in the last 168 hours. No results for input(s): AMMONIA in the last 168 hours. Coagulation Profile: No results for input(s): INR, PROTIME in the last 168 hours. Cardiac Enzymes: No results for input(s): CKTOTAL, CKMB, CKMBINDEX, TROPONINI in the last  168 hours. BNP (last 3 results) No results for input(s): PROBNP in the last 8760 hours. HbA1C: No results for input(s): HGBA1C in the last 72 hours. CBG: No results for input(s): GLUCAP in the last 168 hours. Lipid Profile: No results for input(s): CHOL, HDL, LDLCALC, TRIG, CHOLHDL, LDLDIRECT in the last 72 hours. Thyroid Function Tests: No results for input(s): TSH, T4TOTAL, FREET4, T3FREE, THYROIDAB in the last 72 hours. Anemia Panel: No results for input(s): VITAMINB12, FOLATE, FERRITIN, TIBC, IRON, RETICCTPCT in the last 72 hours. Sepsis Labs: No results for input(s): PROCALCITON, LATICACIDVEN in the last 168 hours.  No results found for this or any previous visit (from the past 240 hour(s)).       Radiology Studies: No results found.      Scheduled Meds: . Chlorhexidine Gluconate Cloth  6 each Topical Daily  . gabapentin  100 mg Oral TID  . ibuprofen  400 mg Oral TID  . miconazole nitrate   Topical BID  . nicotine  14 mg Transdermal Daily  . rifampin  300 mg Oral BID WC  . sodium chloride flush  10-40 mL Intracatheter Q12H   Continuous Infusions: . cefTRIAXone (ROCEPHIN)  IV 2 g (01/20/20 2337)  . lactated ringers 200 mL/hr at 12/22/19 2004  . vancomycin 1,000 mg (01/21/20 0305)     LOS: 33 days    Time spent: over  39 min    Fayrene Helper, MD [facility]   To contact the attending provider between 7A-7P or the covering provider during after hours 7P-7A, please log into the web site [email or web address] and access using universal [place] password for that web site. If you do not have the password, please call the hospital operator.  01/21/2020, 4:04 PM

## 2020-01-21 NOTE — Plan of Care (Signed)
  Problem: Education: Goal: Knowledge of General Education information will improve Description: Including pain rating scale, medication(s)/side effects and non-pharmacologic comfort measures Outcome: Progressing   Problem: Pain Managment: Goal: General experience of comfort will improve Outcome: Progressing   

## 2020-01-22 LAB — CBC WITH DIFFERENTIAL/PLATELET
Abs Immature Granulocytes: 0.02 10*3/uL (ref 0.00–0.07)
Basophils Absolute: 0 10*3/uL (ref 0.0–0.1)
Basophils Relative: 1 %
Eosinophils Absolute: 0.2 10*3/uL (ref 0.0–0.5)
Eosinophils Relative: 6 %
HCT: 36 % — ABNORMAL LOW (ref 39.0–52.0)
Hemoglobin: 11.9 g/dL — ABNORMAL LOW (ref 13.0–17.0)
Immature Granulocytes: 1 %
Lymphocytes Relative: 44 %
Lymphs Abs: 1.7 10*3/uL (ref 0.7–4.0)
MCH: 26.9 pg (ref 26.0–34.0)
MCHC: 33.1 g/dL (ref 30.0–36.0)
MCV: 81.4 fL (ref 80.0–100.0)
Monocytes Absolute: 0.4 10*3/uL (ref 0.1–1.0)
Monocytes Relative: 10 %
Neutro Abs: 1.4 10*3/uL — ABNORMAL LOW (ref 1.7–7.7)
Neutrophils Relative %: 38 %
Platelets: 125 10*3/uL — ABNORMAL LOW (ref 150–400)
RBC: 4.42 MIL/uL (ref 4.22–5.81)
RDW: 15.8 % — ABNORMAL HIGH (ref 11.5–15.5)
WBC: 3.8 10*3/uL — ABNORMAL LOW (ref 4.0–10.5)
nRBC: 0 % (ref 0.0–0.2)

## 2020-01-22 LAB — COMPREHENSIVE METABOLIC PANEL
ALT: 108 U/L — ABNORMAL HIGH (ref 0–44)
AST: 52 U/L — ABNORMAL HIGH (ref 15–41)
Albumin: 3.5 g/dL (ref 3.5–5.0)
Alkaline Phosphatase: 86 U/L (ref 38–126)
Anion gap: 9 (ref 5–15)
BUN: 19 mg/dL (ref 6–20)
CO2: 25 mmol/L (ref 22–32)
Calcium: 9.1 mg/dL (ref 8.9–10.3)
Chloride: 106 mmol/L (ref 98–111)
Creatinine, Ser: 0.97 mg/dL (ref 0.61–1.24)
GFR calc Af Amer: >60 mL/min (ref >60)
GFR calc non Af Amer: >60 mL/min (ref >60)
Glucose, Bld: 101 mg/dL — ABNORMAL HIGH (ref 70–99)
Potassium: 3.9 mmol/L (ref 3.5–5.1)
Sodium: 140 mmol/L (ref 135–145)
Total Bilirubin: 0.7 mg/dL (ref 0.3–1.2)
Total Protein: 6.3 g/dL — ABNORMAL LOW (ref 6.5–8.1)

## 2020-01-22 LAB — PHOSPHORUS: Phosphorus: 4.9 mg/dL — ABNORMAL HIGH (ref 2.5–4.6)

## 2020-01-22 LAB — MAGNESIUM: Magnesium: 1.8 mg/dL (ref 1.7–2.4)

## 2020-01-22 MED ORDER — OXYCODONE HCL 5 MG PO TABS
10.0000 mg | ORAL_TABLET | ORAL | Status: DC | PRN
  Administered 2020-01-23 – 2020-02-02 (×17): 10 mg via ORAL
  Filled 2020-01-22 (×17): qty 2

## 2020-01-22 MED ORDER — ACETAMINOPHEN 500 MG PO TABS
500.0000 mg | ORAL_TABLET | Freq: Four times a day (QID) | ORAL | Status: DC | PRN
  Administered 2020-01-23: 500 mg via ORAL
  Filled 2020-01-22: qty 1

## 2020-01-22 MED ORDER — OXYCODONE HCL 5 MG PO TABS
5.0000 mg | ORAL_TABLET | ORAL | Status: DC | PRN
  Administered 2020-01-23 – 2020-01-25 (×5): 5 mg via ORAL
  Filled 2020-01-22 (×5): qty 1

## 2020-01-22 NOTE — Progress Notes (Signed)
PROGRESS NOTE    Michael Lester  RAQ:[id number] DOB: 03-Apr-1986 DOA: 12/19/2019 PCP: Patient, [name]   Brief Narrative: Michael Lester an 34 y.o.WM PMHx IVDU   Presents to [facility] after found unresponsive complaining of headache, neck pain right shoulder pain, he was found to be septic with tachycardia and leukocytosis CT of the C-spine show some lucency in C6-C7 MRI of the spine showed discitis and osteomyelitis of C6-C7 and paravertebral edema resulting in phlegmon with with spinal stenosis and cord mass-effect.   Assessment & Plan:   Principal Problem:   Osteomyelitis of C6-7 Active Problems:   Injection of illicit drug within last 12 months   Syncope   Abscess in epidural space of cervical spine   Opioid dependence, daily use (HCC)   Chronic hepatitis C without hepatic coma (HCC)   Severe sepsis (HCC)   Hepatitis C antibody positive in blood  Sepsis due to discitis/osteomyelitis of C6-7: -Anterior cervical discectomy with interbody fusion by Michael Lester on 12/22/2019. -Blood cultures show coag negative staph 1 out of 2 bottles likely contaminant. -Repeated blood cultures on 12/20/2019 have remained negative till date. -Patient will continue IV vancomycin Rocephin and rifampin Michael Lester minimum of 6 weeks Started 01/18/2020.  (Stop date February 02, 2020) -3/23 ordered removal of foam dressing and Steri-Strips overnight.  Incision site healed well negative sign of infection.  Patient order to cover with Tegaderm whenever he showers - schedule ibuprofen for pain, continue norco prn - ID recommending IV abx until April 6, then discharge on doxycycline BID - he's to follow up in ID clinic on April 28th  Hepatitis C: follow with ID outpatient  recurrent syncope: -Echo was unremarkable EKG no significant findings question due to drug use in the setting of sepsis.  Injection of illicit drug within last 12 months: -He relates he went to detox about 4 weeks prior to admission -On admission no  urine tox screen obtained  Normocytic anemia: -Likely due to anemia of chronic disease. -We will need follow-up as an outpatient.  Constipation: -Resolved with MiraLAX.  Tinea cruris -Miconazole BID  Thrombocytopenia Follow, consider medications if continuing to worsen (rifampin?)  Elevated LFTs: Ctm, worsening today, consider meds (rifampin) Will d/c combination pain meds  DVT prophylaxis: SCD Code Status: full  Family Communication: none at bedside Disposition Plan:  . Patient came from: home            . Anticipated d/c place:home . Barriers to d/c OR conditions which need to be met to effect Michael Lester safe d/c: pending completion of IV abx   Consultants:   ID  Neurosurgery  Procedures:  3/23 C5-6, C6-7 anterior cervical discectomy with interbody fusion utilizing interbody allograft wedges and anterior plate instrumentation.  Antimicrobials:  Anti-infectives (From admission, onward)   Start     Dose/Rate Route Frequency Ordered Stop   01/13/20 1600  vancomycin (VANCOCIN) IVPB 1000 mg/200 mL premix     1,000 mg 200 mL/hr over 60 Minutes Intravenous Every 12 hours 01/13/20 1447 02/03/20 0359   01/13/20 1446  vancomycin (VANCOCIN) IVPB 1000 mg/200 mL premix  Status:  Discontinued     1,000 mg 200 mL/hr over 60 Minutes Intravenous Every 12 hours 01/13/20 1446 01/13/20 1447   01/11/20 1400  vancomycin (VANCOREADY) IVPB 750 mg/150 mL  Status:  Discontinued     750 mg 150 mL/hr over 60 Minutes Intravenous Every 12 hours 01/11/20 0223 01/13/20 1446   12/31/19 1400  vancomycin (VANCOCIN) IVPB 1000 mg/200 mL  premix  Status:  Discontinued     1,000 mg 200 mL/hr over 60 Minutes Intravenous Every 12 hours 12/31/19 1214 01/11/20 0223   12/26/19 0800  rifampin (RIFADIN) capsule 300 mg     300 mg Oral 2 times daily with meals 12/25/19 1037 02/02/20 2359   12/22/19 1938  bacitracin 50,000 Units in sodium chloride 0.9 % 500 mL irrigation  Status:  Discontinued       As needed  12/22/19 1939 12/22/19 2048   12/22/19 1821  ceFAZolin (ANCEF) 2-4 GM/100ML-% IVPB    Note to Pharmacy: Michael Lester   : cabinet override      12/22/19 1821 12/23/19 0629   12/22/19 1000  vancomycin (VANCOREADY) IVPB 1250 mg/250 mL  Status:  Discontinued     1,250 mg 166.7 mL/hr over 90 Minutes Intravenous Every 12 hours 12/21/19 2301 12/31/19 0339   12/20/19 0600  vancomycin (VANCOREADY) IVPB 1750 mg/350 mL  Status:  Discontinued     1,750 mg 175 mL/hr over 120 Minutes Intravenous Every 12 hours 12/20/19 0042 12/21/19 2301   12/20/19 0030  cefTRIAXone (ROCEPHIN) 2 g in sodium chloride 0.9 % 100 mL IVPB     2 g 200 mL/hr over 30 Minutes Intravenous Every 24 hours 12/20/19 0027 02/02/20 2359     Subjective: Sore, saw his kid yesterday Sore from playing with him  Objective: Vitals:   01/21/20 1923 01/22/20 0334 01/22/20 0712 01/22/20 1318  BP: 111/77 111/68 107/69 113/71  Pulse: 90 69 79 77  Resp: 15 16 16 16   Temp: 98.3 F (36.8 C) 97.6 F (36.4 C) 97.8 F (36.6 C) 98.1 F (36.7 C)  TempSrc: Oral Oral Oral Oral  SpO2: 100% 100% 97% 99%  Weight:      Height:        Intake/Output Summary (Last 24 hours) at 01/22/2020 1735 Last data filed at 01/22/2020 1300 Gross per 24 hour  Intake 240 ml  Output --  Net 240 ml   Filed Weights   12/20/19 0135  Weight: 71.5 kg    Examination:  General: No acute distress. Cardiovascular: Heart sounds show Michael Lester regular rate, and rhythm.  Lungs: Clear to auscultation bilaterally  Abdomen: Soft, nontender, nondistended  Neurological: Alert and oriented 3. Moves all extremities 4 . Cranial nerves II through XII grossly intact. Skin: Warm and dry. No rashes or lesions. Extremities: No clubbing or cyanosis. No edema. .   Data Reviewed: I have personally reviewed following labs and imaging studies  CBC: Recent Labs  Lab 01/18/20 0940 01/20/20 0420 01/22/20 0354  WBC 3.1* 3.8* 3.8*  NEUTROABS 1.4* 1.4* 1.4*  HGB 12.6* 12.5* 11.9*   HCT 38.4* 37.3* 36.0*  MCV 82.4 80.9 81.4  PLT 153 143* 144*   Basic Metabolic Panel: Recent Labs  Lab 01/17/20 0500 01/18/20 0940 01/20/20 0420 01/22/20 0354  NA 137 138 139 140  K 3.7 3.5 4.0 3.9  CL 104 104 105 106  CO2 24 26 27 25   GLUCOSE 85 152* 108* 101*  BUN 15 13 15 19   CREATININE 0.87 1.07 0.94 0.97  CALCIUM 9.1 9.2 9.3 9.1  MG  --  1.6* 1.8 1.8  PHOS  --  4.0 4.7* 4.9*   GFR: Estimated Creatinine Clearance: 107.5 mL/min (by C-G formula based on SCr of 0.97 mg/dL). Liver Function Tests: Recent Labs  Lab 01/18/20 0940 01/20/20 0420 01/22/20 0354  AST 25 33 52*  ALT 37 59* 108*  ALKPHOS 87 93 86  BILITOT 0.2* 0.5  0.7  PROT 6.5 6.5 6.3*  ALBUMIN 3.6 3.6 3.5   No results for input(s): LIPASE, AMYLASE in the last 168 hours. No results for input(s): AMMONIA in the last 168 hours. Coagulation Profile: No results for input(s): INR, PROTIME in the last 168 hours. Cardiac Enzymes: No results for input(s): CKTOTAL, CKMB, CKMBINDEX, TROPONINI in the last 168 hours. BNP (last 3 results) No results for input(s): PROBNP in the last 8760 hours. HbA1C: No results for input(s): HGBA1C in the last 72 hours. CBG: No results for input(s): GLUCAP in the last 168 hours. Lipid Profile: No results for input(s): CHOL, HDL, LDLCALC, TRIG, CHOLHDL, LDLDIRECT in the last 72 hours. Thyroid Function Tests: No results for input(s): TSH, T4TOTAL, FREET4, T3FREE, THYROIDAB in the last 72 hours. Anemia Panel: No results for input(s): VITAMINB12, FOLATE, FERRITIN, TIBC, IRON, RETICCTPCT in the last 72 hours. Sepsis Labs: No results for input(s): PROCALCITON, LATICACIDVEN in the last 168 hours.  No results found for this or any previous visit (from the past 240 hour(s)).       Radiology Studies: No results found.      Scheduled Meds: . Chlorhexidine Gluconate Cloth  6 each Topical Daily  . gabapentin  100 mg Oral TID  . ibuprofen  400 mg Oral TID  . miconazole  nitrate   Topical BID  . nicotine  14 mg Transdermal Daily  . rifampin  300 mg Oral BID WC  . sodium chloride flush  10-40 mL Intracatheter Q12H   Continuous Infusions: . cefTRIAXone (ROCEPHIN)  IV Stopped (01/22/20 0955)  . lactated ringers 200 mL/hr at 12/22/19 2004  . vancomycin 1,000 mg (01/22/20 1633)     LOS: 34 days    Time spent: over 49 min    Fayrene Helper, MD [facility]   To contact the attending provider between 7A-7P or the covering provider during after hours 7P-7A, please log into the web site [email or web address] and access using universal [place] password for that web site. If you do not have the password, please call the hospital operator.  01/22/2020, 5:35 PM

## 2020-01-22 NOTE — Progress Notes (Signed)
Nutrition Brief Note  RD pulled to chart secondary to LOS (33 days).   Wt Readings from Last 15 Encounters:  12/20/19 71.5 kg  12/06/19 68 kg  11/20/18 81.6 kg  04/17/16 70.3 kg  11/17/14 70.7 kg   Michael Lester an 34 y.o.WM PMHxIVDU. Presents to [facility] after found unresponsive complaining of headache, neck pain right shoulder pain, he was found to be septic with tachycardia and leukocytosis CT of the C-spine show some lucency in C6-C7 MRI of the spine showed discitis and osteomyelitis of C6-C7 and paravertebral edema resulting in phlegmon with with spinal stenosis and cord mass-effect.   Pt admitted with sepsis secondary to discutis osteomyelitis of C6-7.   Per MD notes, plan to remain inpatient until course of IV antibiotics is completed (stop date 02/02/20).   Current diet order is regular, patient is consuming approximately 50-100% of meals at this time. Labs and medications reviewed.   No nutrition interventions warranted at this time. If nutrition issues arise, please consult RD.   Levada Schilling, RD, LDN, CDCES Registered Dietitian II Certified Diabetes Care and Education Specialist Please refer to [facility] for RD and/or RD on-call/weekend/after hours pager

## 2020-01-22 NOTE — Plan of Care (Signed)
  Problem: Education: Goal: Knowledge of General Education information will improve Description: Including pain rating scale, medication(s)/side effects and non-pharmacologic comfort measures Outcome: Progressing   Problem: Clinical Measurements: Goal: Ability to maintain clinical measurements within normal limits will improve Outcome: Progressing Goal: Respiratory complications will improve Outcome: Progressing   Problem: Activity: Goal: Risk for activity intolerance will decrease Outcome: Progressing   

## 2020-01-23 LAB — CBC WITH DIFFERENTIAL/PLATELET
Abs Immature Granulocytes: 0.02 10*3/uL (ref 0.00–0.07)
Basophils Absolute: 0 10*3/uL (ref 0.0–0.1)
Basophils Relative: 1 %
Eosinophils Absolute: 0.3 10*3/uL (ref 0.0–0.5)
Eosinophils Relative: 7 %
HCT: 37.2 % — ABNORMAL LOW (ref 39.0–52.0)
Hemoglobin: 12.2 g/dL — ABNORMAL LOW (ref 13.0–17.0)
Immature Granulocytes: 1 %
Lymphocytes Relative: 44 %
Lymphs Abs: 1.5 10*3/uL (ref 0.7–4.0)
MCH: 26.8 pg (ref 26.0–34.0)
MCHC: 32.8 g/dL (ref 30.0–36.0)
MCV: 81.8 fL (ref 80.0–100.0)
Monocytes Absolute: 0.4 10*3/uL (ref 0.1–1.0)
Monocytes Relative: 11 %
Neutro Abs: 1.3 10*3/uL — ABNORMAL LOW (ref 1.7–7.7)
Neutrophils Relative %: 36 %
Platelets: 127 10*3/uL — ABNORMAL LOW (ref 150–400)
RBC: 4.55 MIL/uL (ref 4.22–5.81)
RDW: 15.5 % (ref 11.5–15.5)
WBC: 3.5 10*3/uL — ABNORMAL LOW (ref 4.0–10.5)
nRBC: 0 % (ref 0.0–0.2)

## 2020-01-23 LAB — COMPREHENSIVE METABOLIC PANEL
ALT: 156 U/L — ABNORMAL HIGH (ref 0–44)
AST: 70 U/L — ABNORMAL HIGH (ref 15–41)
Albumin: 3.5 g/dL (ref 3.5–5.0)
Alkaline Phosphatase: 89 U/L (ref 38–126)
Anion gap: 11 (ref 5–15)
BUN: 17 mg/dL (ref 6–20)
CO2: 24 mmol/L (ref 22–32)
Calcium: 9 mg/dL (ref 8.9–10.3)
Chloride: 104 mmol/L (ref 98–111)
Creatinine, Ser: 0.91 mg/dL (ref 0.61–1.24)
GFR calc Af Amer: >60 mL/min (ref >60)
GFR calc non Af Amer: >60 mL/min (ref >60)
Glucose, Bld: 83 mg/dL (ref 70–99)
Potassium: 3.6 mmol/L (ref 3.5–5.1)
Sodium: 139 mmol/L (ref 135–145)
Total Bilirubin: 0.7 mg/dL (ref 0.3–1.2)
Total Protein: 6.2 g/dL — ABNORMAL LOW (ref 6.5–8.1)

## 2020-01-23 LAB — MAGNESIUM: Magnesium: 1.7 mg/dL (ref 1.7–2.4)

## 2020-01-23 LAB — VANCOMYCIN, TROUGH: Vancomycin Tr: 12 ug/mL — ABNORMAL LOW (ref 15–20)

## 2020-01-23 LAB — VANCOMYCIN, PEAK: Vancomycin Pk: 35 ug/mL (ref 30–40)

## 2020-01-23 LAB — PHOSPHORUS: Phosphorus: 4.7 mg/dL — ABNORMAL HIGH (ref 2.5–4.6)

## 2020-01-23 NOTE — Plan of Care (Signed)
  Problem: Education: Goal: Knowledge of General Education information will improve Description: Including pain rating scale, medication(s)/side effects and non-pharmacologic comfort measures 01/23/2020 1731 by Loma Sousa, RN Outcome: Progressing 01/23/2020 1730 by Loma Sousa, RN Outcome: Progressing

## 2020-01-23 NOTE — Plan of Care (Signed)
  Problem: Education: Goal: Knowledge of General Education information will improve Description: Including pain rating scale, medication(s)/side effects and non-pharmacologic comfort measures Outcome: Progressing   Problem: Clinical Measurements: Goal: Will remain free from infection Outcome: Progressing   Problem: Skin Integrity: Goal: Risk for impaired skin integrity will decrease Outcome: Progressing   

## 2020-01-23 NOTE — Progress Notes (Signed)
Pharmacy Antibiotic Note  Michael Lester is a 34 y.o. male admitted on 12/19/2019 with osteomyelitis of the cervical spine. Pharmacy has been consulted for vancomycin dosing.  SCr fluctuating this admission but stable around 1. Scr today is 0.87.  Afebrile. WBC normal.  Vancomycin peak this AM was 35 and trough this afternoon 12. This results an AUC of 535, which is at our goal of 400-550.    Plan: Continue vancomycin to 1000 mg IV q12h. Tentative stop date for all antibiotics: 02/02/20 Monitor clinical progress, renal function, vanc levels as clinically indicated.   Height: 6\' 1"  (185.4 cm) Weight: 157 lb 10.1 oz (71.5 kg) IBW/kg (Calculated) : 79.9  Temp (24hrs), Avg:98.1 F (36.7 C), Min:97.7 F (36.5 C), Max:98.6 F (37 C)  Recent Labs  Lab 01/17/20 0500 01/18/20 0940 01/20/20 0420 01/20/20 0818 01/20/20 1530 01/22/20 0354 01/23/20 0607 01/23/20 1537  WBC  --  3.1* 3.8*  --   --  3.8* 3.5*  --   CREATININE 0.87 1.07 0.94  --   --  0.97 0.91  --   VANCOTROUGH  --   --   --   --  6*  --   --  12*  VANCOPEAK  --   --   --  18*  --   --  35  --     Estimated Creatinine Clearance: 114.6 mL/min (by C-G formula based on SCr of 0.91 mg/dL).    Allergies  Allergen Reactions  . Shellfish Allergy Anaphylaxis    Antimicrobials this admission: Vanc 2/20 >>4/6 CTX 2/21 >>4/6 Rifampin 2/27 >>(4/6) Cefepime/Flagyl x1 2/20  Dose adjustments this admission: Vanc 1250 mg q12h (2/26) >> 1g q12h (3/4) >> 750mg  q12h (3/14) >> 1000 mg q12h (3/17) >>  Cultures: 2/20 BCx [facility] - 1/2 MRSE 2/21 MRSA PCR - negative 2/21 BCx - negative 2/23 cervical wound - negative   Thank you for allowing pharmacy to be a part of this patient's care.  3/21, [facility] Clinical Pharmacist Phone [phone]  01/23/2020 4:55 PM

## 2020-01-23 NOTE — Plan of Care (Signed)
  Problem: Education: Goal: Knowledge of General Education information will improve Description Including pain rating scale, medication(s)/side effects and non-pharmacologic comfort measures Outcome: Progressing   

## 2020-01-23 NOTE — Progress Notes (Signed)
PROGRESS NOTE    QUILL GRINDER  IFO:[id number] DOB: 18-Apr-1986 DOA: 12/19/2019 PCP: Patient, [name]   Brief Narrative: Michael Lester an 34 y.o.WM PMHx IVDU   Presents to [facility] after found unresponsive complaining of headache, neck pain right shoulder pain, he was found to be septic with tachycardia and leukocytosis CT of the C-spine show some lucency in C6-C7 MRI of the spine showed discitis and osteomyelitis of C6-C7 and paravertebral edema resulting in phlegmon with with spinal stenosis and cord mass-effect.   Assessment & Plan:   Principal Problem:   Osteomyelitis of C6-7 Active Problems:   Injection of illicit drug within last 12 months   Syncope   Abscess in epidural space of cervical spine   Opioid dependence, daily use (HCC)   Chronic hepatitis C without hepatic coma (HCC)   Severe sepsis (HCC)   Hepatitis C antibody positive in blood  Sepsis due to discitis/osteomyelitis of C6-7: -Anterior cervical discectomy with interbody fusion by Dr. Trenton Gammon on 12/22/2019. -Blood cultures show coag negative staph 1 out of 2 bottles likely contaminant. -Repeated blood cultures on 12/20/2019 have remained negative till date. -Patient will continue IV vancomycin Rocephin and rifampin Eswin Worrell minimum of 6 weeks Started 01/18/2020.  (Stop date February 02, 2020) -3/23 ordered removal of foam dressing and Steri-Strips overnight.  Incision site healed well negative sign of infection.  Patient order to cover with Tegaderm whenever he showers - schedule ibuprofen for pain, continue norco prn - ID recommending IV abx until April 6, then discharge on doxycycline BID - he's to follow up in ID clinic on April 28th  Hepatitis C: follow with ID outpatient  recurrent syncope: -Echo was unremarkable EKG no significant findings question due to drug use in the setting of sepsis.  Injection of illicit drug within last 12 months: -He relates he went to detox about 4 weeks prior to admission -On admission no  urine tox screen obtained  Normocytic anemia: -Likely due to anemia of chronic disease. -We will need follow-up as an outpatient.  Constipation: -Resolved with MiraLAX.  Tinea cruris -Miconazole BID  Thrombocytopenia Follow, consider medications if continuing to worsen (rifampin?) Stable, continue to follow  Elevated LFTs: Ctm, worsening today, consider meds (rifampin) or related to hep c Will d/c combination pain meds Discussed rifampin and elevated LFT's with pharmacy today, will continue to monitor  DVT prophylaxis: SCD Code Status: full  Family Communication: none at bedside Disposition Plan:  . Patient came from: home            . Anticipated d/c place:home . Barriers to d/c OR conditions which need to be met to effect Johnathan Heskett safe d/c: pending completion of IV abx   Consultants:   ID  Neurosurgery  Procedures:  3/23 C5-6, C6-7 anterior cervical discectomy with interbody fusion utilizing interbody allograft wedges and anterior plate instrumentation.  Antimicrobials:  Anti-infectives (From admission, onward)   Start     Dose/Rate Route Frequency Ordered Stop   01/13/20 1600  vancomycin (VANCOCIN) IVPB 1000 mg/200 mL premix     1,000 mg 200 mL/hr over 60 Minutes Intravenous Every 12 hours 01/13/20 1447 02/03/20 0359   01/13/20 1446  vancomycin (VANCOCIN) IVPB 1000 mg/200 mL premix  Status:  Discontinued     1,000 mg 200 mL/hr over 60 Minutes Intravenous Every 12 hours 01/13/20 1446 01/13/20 1447   01/11/20 1400  vancomycin (VANCOREADY) IVPB 750 mg/150 mL  Status:  Discontinued     750 mg 150 mL/hr over  60 Minutes Intravenous Every 12 hours 01/11/20 0223 01/13/20 1446   12/31/19 1400  vancomycin (VANCOCIN) IVPB 1000 mg/200 mL premix  Status:  Discontinued     1,000 mg 200 mL/hr over 60 Minutes Intravenous Every 12 hours 12/31/19 1214 01/11/20 0223   12/26/19 0800  rifampin (RIFADIN) capsule 300 mg     300 mg Oral 2 times daily with meals 12/25/19 1037 02/02/20  2359   12/22/19 1938  bacitracin 50,000 Units in sodium chloride 0.9 % 500 mL irrigation  Status:  Discontinued       As needed 12/22/19 1939 12/22/19 2048   12/22/19 1821  ceFAZolin (ANCEF) 2-4 GM/100ML-% IVPB    Note to Pharmacy: Rejeana Brock   : cabinet override      12/22/19 1821 12/23/19 0629   12/22/19 1000  vancomycin (VANCOREADY) IVPB 1250 mg/250 mL  Status:  Discontinued     1,250 mg 166.7 mL/hr over 90 Minutes Intravenous Every 12 hours 12/21/19 2301 12/31/19 0339   12/20/19 0600  vancomycin (VANCOREADY) IVPB 1750 mg/350 mL  Status:  Discontinued     1,750 mg 175 mL/hr over 120 Minutes Intravenous Every 12 hours 12/20/19 0042 12/21/19 2301   12/20/19 0030  cefTRIAXone (ROCEPHIN) 2 g in sodium chloride 0.9 % 100 mL IVPB     2 g 200 mL/hr over 30 Minutes Intravenous Every 24 hours 12/20/19 0027 02/02/20 2359     Subjective: No new complaints  Objective: Vitals:   01/22/20 1939 01/23/20 0446 01/23/20 0726 01/23/20 1343  BP: 106/70 (!) 96/58 107/74 111/71  Pulse: 84 87 79 79  Resp:   17 17  Temp: 98.2 F (36.8 C) 97.9 F (36.6 C) 97.7 F (36.5 C) 98.6 F (37 C)  TempSrc: Oral Oral Oral Oral  SpO2: 99% 99% 99% 100%  Weight:      Height:       No intake or output data in the 24 hours ending 01/23/20 1407 Filed Weights   12/20/19 0135  Weight: 71.5 kg    Examination:  General: No acute distress. Cardiovascular: Heart sounds show Allina Riches regular rate, and rhythm.  Lungs: Clear to auscultation bilaterally  Abdomen: Soft, nontender, nondistended Neurological: Alert and oriented 3. Moves all extremities 4. Cranial nerves II through XII grossly intact. Skin: Warm and dry. No rashes or lesions. Extremities: No clubbing or cyanosis. No edema.    Data Reviewed: I have personally reviewed following labs and imaging studies  CBC: Recent Labs  Lab 01/18/20 0940 01/20/20 0420 01/22/20 0354 01/23/20 0607  WBC 3.1* 3.8* 3.8* 3.5*  NEUTROABS 1.4* 1.4* 1.4* 1.3*  HGB  12.6* 12.5* 11.9* 12.2*  HCT 38.4* 37.3* 36.0* 37.2*  MCV 82.4 80.9 81.4 81.8  PLT 153 143* 125* 826*   Basic Metabolic Panel: Recent Labs  Lab 01/17/20 0500 01/18/20 0940 01/20/20 0420 01/22/20 0354 01/23/20 0607  NA 137 138 139 140 139  K 3.7 3.5 4.0 3.9 3.6  CL 104 104 105 106 104  CO2 _0 GLUCOSE 85 152* 108* 101* 83  BUN _1 CREATININE 0.87 1.07 0.94 0.97 0.91  CALCIUM 9.1 9.2 9.3 9.1 9.0  MG  --  1.6* 1.8 1.8 1.7  PHOS  --  4.0 4.7* 4.9* 4.7*   GFR: Estimated Creatinine Clearance: 114.6 mL/min (by C-G formula based on SCr of 0.91 mg/dL). Liver Function Tests: Recent Labs  Lab 01/18/20 0940 01/20/20 0420 01/22/20 0354 01/23/20 0607  AST 25 33  52* 70*  ALT 37 59* 108* 156*  ALKPHOS 87 93 86 89  BILITOT 0.2* 0.5 0.7 0.7  PROT 6.5 6.5 6.3* 6.2*  ALBUMIN 3.6 3.6 3.5 3.5   No results for input(s): LIPASE, AMYLASE in the last 168 hours. No results for input(s): AMMONIA in the last 168 hours. Coagulation Profile: No results for input(s): INR, PROTIME in the last 168 hours. Cardiac Enzymes: No results for input(s): CKTOTAL, CKMB, CKMBINDEX, TROPONINI in the last 168 hours. BNP (last 3 results) No results for input(s): PROBNP in the last 8760 hours. HbA1C: No results for input(s): HGBA1C in the last 72 hours. CBG: No results for input(s): GLUCAP in the last 168 hours. Lipid Profile: No results for input(s): CHOL, HDL, LDLCALC, TRIG, CHOLHDL, LDLDIRECT in the last 72 hours. Thyroid Function Tests: No results for input(s): TSH, T4TOTAL, FREET4, T3FREE, THYROIDAB in the last 72 hours. Anemia Panel: No results for input(s): VITAMINB12, FOLATE, FERRITIN, TIBC, IRON, RETICCTPCT in the last 72 hours. Sepsis Labs: No results for input(s): PROCALCITON, LATICACIDVEN in the last 168 hours.  No results found for this or any previous visit (from the past 240 hour(s)).       Radiology Studies: No results found.      Scheduled Meds: .  Chlorhexidine Gluconate Cloth  6 each Topical Daily  . gabapentin  100 mg Oral TID  . ibuprofen  400 mg Oral TID  . miconazole nitrate   Topical BID  . nicotine  14 mg Transdermal Daily  . rifampin  300 mg Oral BID WC  . sodium chloride flush  10-40 mL Intracatheter Q12H   Continuous Infusions: . cefTRIAXone (ROCEPHIN)  IV 2 g (01/23/20 0027)  . lactated ringers 200 mL/hr at 12/22/19 2004  . vancomycin 1,000 mg (01/23/20 0416)     LOS: 35 days    Time spent: over 30 min    Fayrene Helper, MD [facility]   To contact the attending provider between 7A-7P or the covering provider during after hours 7P-7A, please log into the web site [email or web address] and access using universal [place] password for that web site. If you do not have the password, please call the hospital operator.  01/23/2020, 2:07 PM

## 2020-01-24 ENCOUNTER — Inpatient Hospital Stay (HOSPITAL_COMMUNITY): Payer: Self-pay

## 2020-01-24 LAB — COMPREHENSIVE METABOLIC PANEL
ALT: 198 U/L — ABNORMAL HIGH (ref 0–44)
AST: 86 U/L — ABNORMAL HIGH (ref 15–41)
Albumin: 3.5 g/dL (ref 3.5–5.0)
Alkaline Phosphatase: 88 U/L (ref 38–126)
Anion gap: 9 (ref 5–15)
BUN: 19 mg/dL (ref 6–20)
CO2: 25 mmol/L (ref 22–32)
Calcium: 9.1 mg/dL (ref 8.9–10.3)
Chloride: 105 mmol/L (ref 98–111)
Creatinine, Ser: 0.97 mg/dL (ref 0.61–1.24)
GFR calc Af Amer: >60 mL/min (ref >60)
GFR calc non Af Amer: >60 mL/min (ref >60)
Glucose, Bld: 96 mg/dL (ref 70–99)
Potassium: 3.8 mmol/L (ref 3.5–5.1)
Sodium: 139 mmol/L (ref 135–145)
Total Bilirubin: 0.5 mg/dL (ref 0.3–1.2)
Total Protein: 6.4 g/dL — ABNORMAL LOW (ref 6.5–8.1)

## 2020-01-24 LAB — CBC WITH DIFFERENTIAL/PLATELET
Abs Immature Granulocytes: 0.02 10*3/uL (ref 0.00–0.07)
Basophils Absolute: 0 10*3/uL (ref 0.0–0.1)
Basophils Relative: 1 %
Eosinophils Absolute: 0.3 10*3/uL (ref 0.0–0.5)
Eosinophils Relative: 8 %
HCT: 37.6 % — ABNORMAL LOW (ref 39.0–52.0)
Hemoglobin: 12.4 g/dL — ABNORMAL LOW (ref 13.0–17.0)
Immature Granulocytes: 1 %
Lymphocytes Relative: 39 %
Lymphs Abs: 1.6 10*3/uL (ref 0.7–4.0)
MCH: 27.3 pg (ref 26.0–34.0)
MCHC: 33 g/dL (ref 30.0–36.0)
MCV: 82.8 fL (ref 80.0–100.0)
Monocytes Absolute: 0.4 10*3/uL (ref 0.1–1.0)
Monocytes Relative: 11 %
Neutro Abs: 1.6 10*3/uL — ABNORMAL LOW (ref 1.7–7.7)
Neutrophils Relative %: 40 %
Platelets: 134 10*3/uL — ABNORMAL LOW (ref 150–400)
RBC: 4.54 MIL/uL (ref 4.22–5.81)
RDW: 15.7 % — ABNORMAL HIGH (ref 11.5–15.5)
WBC: 4 10*3/uL (ref 4.0–10.5)
nRBC: 0 % (ref 0.0–0.2)

## 2020-01-24 LAB — MAGNESIUM: Magnesium: 1.7 mg/dL (ref 1.7–2.4)

## 2020-01-24 LAB — PHOSPHORUS: Phosphorus: 4.8 mg/dL — ABNORMAL HIGH (ref 2.5–4.6)

## 2020-01-24 NOTE — Progress Notes (Addendum)
PROGRESS NOTE    Michael Lester  RWE:[id number] DOB: 11-Sep-1986 DOA: 12/19/2019 PCP: Patient, [name]   Brief Narrative: Michael Lester an 34 y.o.WM PMHx IVDU   Presents to [facility] after found unresponsive complaining of headache, neck pain right shoulder pain, he was found to be septic with tachycardia and leukocytosis CT of the C-spine show some lucency in C6-C7 MRI of the spine showed discitis and osteomyelitis of C6-C7 and paravertebral edema resulting in phlegmon with with spinal stenosis and cord mass-effect.   Assessment & Plan:   Principal Problem:   Osteomyelitis of C6-7 Active Problems:   Injection of illicit drug within last 12 months   Syncope   Abscess in epidural space of cervical spine   Opioid dependence, daily use (HCC)   Chronic hepatitis C without hepatic coma (HCC)   Severe sepsis (HCC)   Hepatitis C antibody positive in blood  Sepsis due to discitis/osteomyelitis of C6-7: -Anterior cervical discectomy with interbody fusion by Dr. Trenton Gammon on 12/22/2019. -Blood cultures show coag negative staph 1 out of 2 bottles likely contaminant. -Repeated blood cultures on 12/20/2019 have remained negative till date. -Patient will continue IV vancomycin Rocephin and rifampin [name] minimum of 6 weeks Started 01/18/2020.  (Stop date February 02, 2020) - will d/c rifampin with rising LFT's, continue to monitor, discussed with ID -3/23 ordered removal of foam dressing and Steri-Strips overnight.  Incision site healed well negative sign of infection.  Patient order to cover with Tegaderm whenever he showers - schedule ibuprofen for pain, continue norco prn - ID recommending IV abx until April 6, then discharge on doxycycline BID - he's to follow up in ID clinic on April 28th  Hepatitis C: follow with ID outpatient  recurrent syncope: -Echo was unremarkable EKG no significant findings question due to drug use in the setting of sepsis.  Injection of illicit drug within last 12  months: -He relates he went to detox about 4 weeks prior to admission -On admission no urine tox screen obtained  Normocytic anemia: -Likely due to anemia of chronic disease. -We will need follow-up as an outpatient.  Constipation: -Resolved with MiraLAX.  Tinea cruris -Miconazole BID  Thrombocytopenia Follow, consider medications if continuing to worsen (rifampin?) stable, continue to follow  Elevated LFTs: Ctm, gradually worsening today, consider meds (rifampin) or related to hep c RUQ [place] Will d/c combination pain meds Discussed rifampin and elevated LFT's with pharmacy today, recommending discuss with ID.  Discussed with Dr. Tommy Medal who noted we could d/c.   DVT prophylaxis: SCD Code Status: full  Family Communication: none at bedside Disposition Plan:  . Patient came from: home            . Anticipated d/c place:home . Barriers to d/c OR conditions which need to be met to effect [name] safe d/c: pending completion of IV abx   Consultants:   ID  Neurosurgery  Procedures:  3/23 C5-6, C6-7 anterior cervical discectomy with interbody fusion utilizing interbody allograft wedges and anterior plate instrumentation.  Antimicrobials:  Anti-infectives (From admission, onward)   Start     Dose/Rate Route Frequency Ordered Stop   01/13/20 1600  vancomycin (VANCOCIN) IVPB 1000 mg/200 mL premix     1,000 mg 200 mL/hr over 60 Minutes Intravenous Every 12 hours 01/13/20 1447 02/03/20 0359   01/13/20 1446  vancomycin (VANCOCIN) IVPB 1000 mg/200 mL premix  Status:  Discontinued     1,000 mg 200 mL/hr over 60 Minutes Intravenous Every 12 hours  01/13/20 1446 01/13/20 1447   01/11/20 1400  vancomycin (VANCOREADY) IVPB 750 mg/150 mL  Status:  Discontinued     750 mg 150 mL/hr over 60 Minutes Intravenous Every 12 hours 01/11/20 0223 01/13/20 1446   12/31/19 1400  vancomycin (VANCOCIN) IVPB 1000 mg/200 mL premix  Status:  Discontinued     1,000 mg 200 mL/hr over 60 Minutes  Intravenous Every 12 hours 12/31/19 1214 01/11/20 0223   12/26/19 0800  rifampin (RIFADIN) capsule 300 mg  Status:  Discontinued     300 mg Oral 2 times daily with meals 12/25/19 1037 01/24/20 1451   12/22/19 1938  bacitracin 50,000 Units in sodium chloride 0.9 % 500 mL irrigation  Status:  Discontinued       As needed 12/22/19 1939 12/22/19 2048   12/22/19 1821  ceFAZolin (ANCEF) 2-4 GM/100ML-% IVPB    Note to Pharmacy: Rejeana Brock   : cabinet override      12/22/19 1821 12/23/19 0629   12/22/19 1000  vancomycin (VANCOREADY) IVPB 1250 mg/250 mL  Status:  Discontinued     1,250 mg 166.7 mL/hr over 90 Minutes Intravenous Every 12 hours 12/21/19 2301 12/31/19 0339   12/20/19 0600  vancomycin (VANCOREADY) IVPB 1750 mg/350 mL  Status:  Discontinued     1,750 mg 175 mL/hr over 120 Minutes Intravenous Every 12 hours 12/20/19 0042 12/21/19 2301   12/20/19 0030  cefTRIAXone (ROCEPHIN) 2 g in sodium chloride 0.9 % 100 mL IVPB     2 g 200 mL/hr over 30 Minutes Intravenous Every 24 hours 12/20/19 0027 02/02/20 2359     Subjective: No new complaints  Objective: Vitals:   01/23/20 1919 01/24/20 0322 01/24/20 0918 01/24/20 1518  BP: 110/70 101/67 118/77 108/68  Pulse: 73 72 100 76  Resp:   18 16  Temp: 98.1 F (36.7 C) 97.9 F (36.6 C) 97.9 F (36.6 C) 98.4 F (36.9 C)  TempSrc: Oral Oral Oral Oral  SpO2: 99% 97% 98% 100%  Weight:      Height:       No intake or output data in the 24 hours ending 01/24/20 1541 Filed Weights   12/20/19 0135  Weight: 71.5 kg    Examination:  General: No acute distress. Cardiovascular: Heart sounds show Khyson Sebesta regular rate, and rhythm Lungs: Clear to auscultation bilaterally Abdomen: Soft, nontender, nondistended  Neurological: Alert and oriented 3. Moves all extremities 4. Cranial nerves II through XII grossly intact. Skin: Warm and dry. No rashes or lesions. Extremities: No clubbing or cyanosis. No edema.  Data Reviewed: I have personally  reviewed following labs and imaging studies  CBC: Recent Labs  Lab 01/18/20 0940 01/20/20 0420 01/22/20 0354 01/23/20 0607 01/24/20 0409  WBC 3.1* 3.8* 3.8* 3.5* 4.0  NEUTROABS 1.4* 1.4* 1.4* 1.3* 1.6*  HGB 12.6* 12.5* 11.9* 12.2* 12.4*  HCT 38.4* 37.3* 36.0* 37.2* 37.6*  MCV 82.4 80.9 81.4 81.8 82.8  PLT 153 143* 125* 127* 425*   Basic Metabolic Panel: Recent Labs  Lab 01/18/20 0940 01/20/20 0420 01/22/20 0354 01/23/20 0607 01/24/20 0409  NA 138 139 140 139 139  K 3.5 4.0 3.9 3.6 3.8  CL 104 105 106 104 105  CO2 26 27 25 24 25   GLUCOSE 152* 108* 101* 83 96  BUN 13 15 19 17 19   CREATININE 1.07 0.94 0.97 0.91 0.97  CALCIUM 9.2 9.3 9.1 9.0 9.1  MG 1.6* 1.8 1.8 1.7 1.7  PHOS 4.0 4.7* 4.9* 4.7* 4.8*   GFR: Estimated  Creatinine Clearance: 107.5 mL/min (by C-G formula based on SCr of 0.97 mg/dL). Liver Function Tests: Recent Labs  Lab 01/18/20 0940 01/20/20 0420 01/22/20 0354 01/23/20 0607 01/24/20 0409  AST 25 33 52* 70* 86*  ALT 37 59* 108* 156* 198*  ALKPHOS 87 93 86 89 88  BILITOT 0.2* 0.5 0.7 0.7 0.5  PROT 6.5 6.5 6.3* 6.2* 6.4*  ALBUMIN 3.6 3.6 3.5 3.5 3.5   No results for input(s): LIPASE, AMYLASE in the last 168 hours. No results for input(s): AMMONIA in the last 168 hours. Coagulation Profile: No results for input(s): INR, PROTIME in the last 168 hours. Cardiac Enzymes: No results for input(s): CKTOTAL, CKMB, CKMBINDEX, TROPONINI in the last 168 hours. BNP (last 3 results) No results for input(s): PROBNP in the last 8760 hours. HbA1C: No results for input(s): HGBA1C in the last 72 hours. CBG: No results for input(s): GLUCAP in the last 168 hours. Lipid Profile: No results for input(s): CHOL, HDL, LDLCALC, TRIG, CHOLHDL, LDLDIRECT in the last 72 hours. Thyroid Function Tests: No results for input(s): TSH, T4TOTAL, FREET4, T3FREE, THYROIDAB in the last 72 hours. Anemia Panel: No results for input(s): VITAMINB12, FOLATE, FERRITIN, TIBC, IRON,  RETICCTPCT in the last 72 hours. Sepsis Labs: No results for input(s): PROCALCITON, LATICACIDVEN in the last 168 hours.  No results found for this or any previous visit (from the past 240 hour(s)).       Radiology Studies: No results found.      Scheduled Meds: . Chlorhexidine Gluconate Cloth  6 each Topical Daily  . gabapentin  100 mg Oral TID  . ibuprofen  400 mg Oral TID  . miconazole nitrate   Topical BID  . nicotine  14 mg Transdermal Daily  . sodium chloride flush  10-40 mL Intracatheter Q12H   Continuous Infusions: . cefTRIAXone (ROCEPHIN)  IV 2 g (01/24/20 0016)  . lactated ringers 200 mL/hr at 12/22/19 2004  . vancomycin 1,000 mg (01/24/20 0417)     LOS: 36 days    Time spent: over 30 min    Fayrene Helper, MD [facility]   To contact the attending provider between 7A-7P or the covering provider during after hours 7P-7A, please log into the web site [email or web address] and access using universal [place] password for that web site. If you do not have the password, please call the hospital operator.  01/24/2020, 3:41 PM

## 2020-01-25 LAB — CBC WITH DIFFERENTIAL/PLATELET
Abs Immature Granulocytes: 0.01 10*3/uL (ref 0.00–0.07)
Basophils Absolute: 0 10*3/uL (ref 0.0–0.1)
Basophils Relative: 1 %
Eosinophils Absolute: 0.3 10*3/uL (ref 0.0–0.5)
Eosinophils Relative: 7 %
HCT: 37.9 % — ABNORMAL LOW (ref 39.0–52.0)
Hemoglobin: 12.6 g/dL — ABNORMAL LOW (ref 13.0–17.0)
Immature Granulocytes: 0 %
Lymphocytes Relative: 40 %
Lymphs Abs: 1.6 10*3/uL (ref 0.7–4.0)
MCH: 27.2 pg (ref 26.0–34.0)
MCHC: 33.2 g/dL (ref 30.0–36.0)
MCV: 81.7 fL (ref 80.0–100.0)
Monocytes Absolute: 0.4 10*3/uL (ref 0.1–1.0)
Monocytes Relative: 11 %
Neutro Abs: 1.6 10*3/uL — ABNORMAL LOW (ref 1.7–7.7)
Neutrophils Relative %: 41 %
Platelets: 137 10*3/uL — ABNORMAL LOW (ref 150–400)
RBC: 4.64 MIL/uL (ref 4.22–5.81)
RDW: 15.7 % — ABNORMAL HIGH (ref 11.5–15.5)
WBC: 3.9 10*3/uL — ABNORMAL LOW (ref 4.0–10.5)
nRBC: 0 % (ref 0.0–0.2)

## 2020-01-25 LAB — COMPREHENSIVE METABOLIC PANEL
ALT: 256 U/L — ABNORMAL HIGH (ref 0–44)
AST: 110 U/L — ABNORMAL HIGH (ref 15–41)
Albumin: 3.6 g/dL (ref 3.5–5.0)
Alkaline Phosphatase: 93 U/L (ref 38–126)
Anion gap: 10 (ref 5–15)
BUN: 20 mg/dL (ref 6–20)
CO2: 25 mmol/L (ref 22–32)
Calcium: 8.9 mg/dL (ref 8.9–10.3)
Chloride: 104 mmol/L (ref 98–111)
Creatinine, Ser: 0.96 mg/dL (ref 0.61–1.24)
GFR calc Af Amer: >60 mL/min (ref >60)
GFR calc non Af Amer: >60 mL/min (ref >60)
Glucose, Bld: 91 mg/dL (ref 70–99)
Potassium: 4 mmol/L (ref 3.5–5.1)
Sodium: 139 mmol/L (ref 135–145)
Total Bilirubin: 0.4 mg/dL (ref 0.3–1.2)
Total Protein: 6.4 g/dL — ABNORMAL LOW (ref 6.5–8.1)

## 2020-01-25 LAB — MAGNESIUM: Magnesium: 1.8 mg/dL (ref 1.7–2.4)

## 2020-01-25 LAB — PHOSPHORUS: Phosphorus: 4.8 mg/dL — ABNORMAL HIGH (ref 2.5–4.6)

## 2020-01-25 MED ORDER — IBUPROFEN 200 MG PO TABS
400.0000 mg | ORAL_TABLET | Freq: Four times a day (QID) | ORAL | Status: DC | PRN
  Administered 2020-01-28: 22:00:00 400 mg via ORAL
  Filled 2020-01-25: qty 2

## 2020-01-25 NOTE — Progress Notes (Signed)
PROGRESS NOTE    Michael Lester  OHY:[id number] DOB: 07/19/1986 DOA: 12/19/2019 PCP: Patient, [name]   Brief Narrative: Michael Lester an 34 y.o.WM PMHx IVDU   Presents to [facility] after found unresponsive complaining of headache, neck pain right shoulder pain, he was found to be septic with tachycardia and leukocytosis CT of the C-spine show some lucency in C6-C7 MRI of the spine showed discitis and osteomyelitis of C6-C7 and paravertebral edema resulting in phlegmon with with spinal stenosis and cord mass-effect.   Assessment & Plan:   Principal Problem:   Osteomyelitis of C6-7 Active Problems:   Injection of illicit drug within last 12 months   Syncope   Abscess in epidural space of cervical spine   Opioid dependence, daily use (HCC)   Chronic hepatitis C without hepatic coma (HCC)   Severe sepsis (HCC)   Hepatitis C antibody positive in blood  Sepsis due to discitis/osteomyelitis of C6-7: -Anterior cervical discectomy with interbody fusion by Dr. Trenton Gammon on 12/22/2019. -Blood cultures show coag negative staph 1 out of 2 bottles likely contaminant. -Repeated blood cultures on 12/20/2019 have remained negative till date. -Patient will continue IV vancomycin Rocephin and rifampin Michael Lester minimum of 6 weeks Started 01/18/2020.  (Stop date February 02, 2020) - will d/c rifampin with rising LFT's, continue to monitor, discussed with ID -3/23 ordered removal of foam dressing and Steri-Strips overnight.  Incision site healed well negative sign of infection.  Patient order to cover with Tegaderm whenever he showers - schedule ibuprofen for pain, continue norco prn - ID recommending IV abx until April 6, then discharge on doxycycline BID - he's to follow up in ID clinic on April 28th  Hepatitis C: follow with ID outpatient  recurrent syncope: -Echo was unremarkable EKG no significant findings question due to drug use in the setting of sepsis.  Injection of illicit drug within last 12  months: -He relates he went to detox about 4 weeks prior to admission -On admission no urine tox screen obtained  Normocytic anemia: -Likely due to anemia of chronic disease. -We will need follow-up as an outpatient.  Constipation: -Resolved with MiraLAX.  Tinea cruris -Miconazole BID  Thrombocytopenia Follow, consider medications if continuing to worsen (rifampin?) stable, continue to follow  Elevated LFTs: Ctm, gradually worsening today, consider meds (rifampin) or related to hep c Discussed meds with pharmacy Rifampin d/c'd after discussion with ID Ceftriaxone also rarely associated with elevated LFT's (3% per UTD), will continue to monitor  RUQ [place] - mild floating debris, otherwise normal gallbladder.    DVT prophylaxis: SCD Code Status: full  Family Communication: none at bedside Disposition Plan:  . Patient came from: home            . Anticipated d/c place:home . Barriers to d/c OR conditions which need to be met to effect Michael Lester safe d/c: pending completion of IV abx   Consultants:   ID  Neurosurgery  Procedures:  3/23 C5-6, C6-7 anterior cervical discectomy with interbody fusion utilizing interbody allograft wedges and anterior plate instrumentation.  Antimicrobials:  Anti-infectives (From admission, onward)   Start     Dose/Rate Route Frequency Ordered Stop   01/13/20 1600  vancomycin (VANCOCIN) IVPB 1000 mg/200 mL premix     1,000 mg 200 mL/hr over 60 Minutes Intravenous Every 12 hours 01/13/20 1447 02/03/20 0359   01/13/20 1446  vancomycin (VANCOCIN) IVPB 1000 mg/200 mL premix  Status:  Discontinued     1,000 mg 200 mL/hr over 60  Minutes Intravenous Every 12 hours 01/13/20 1446 01/13/20 1447   01/11/20 1400  vancomycin (VANCOREADY) IVPB 750 mg/150 mL  Status:  Discontinued     750 mg 150 mL/hr over 60 Minutes Intravenous Every 12 hours 01/11/20 0223 01/13/20 1446   12/31/19 1400  vancomycin (VANCOCIN) IVPB 1000 mg/200 mL premix  Status:  Discontinued      1,000 mg 200 mL/hr over 60 Minutes Intravenous Every 12 hours 12/31/19 1214 01/11/20 0223   12/26/19 0800  rifampin (RIFADIN) capsule 300 mg  Status:  Discontinued     300 mg Oral 2 times daily with meals 12/25/19 1037 01/24/20 1451   12/22/19 1938  bacitracin 50,000 Units in sodium chloride 0.9 % 500 mL irrigation  Status:  Discontinued       As needed 12/22/19 1939 12/22/19 2048   12/22/19 1821  ceFAZolin (ANCEF) 2-4 GM/100ML-% IVPB    Note to Pharmacy: Michael Lester   : cabinet override      12/22/19 1821 12/23/19 0629   12/22/19 1000  vancomycin (VANCOREADY) IVPB 1250 mg/250 mL  Status:  Discontinued     1,250 mg 166.7 mL/hr over 90 Minutes Intravenous Every 12 hours 12/21/19 2301 12/31/19 0339   12/20/19 0600  vancomycin (VANCOREADY) IVPB 1750 mg/350 mL  Status:  Discontinued     1,750 mg 175 mL/hr over 120 Minutes Intravenous Every 12 hours 12/20/19 0042 12/21/19 2301   12/20/19 0030  cefTRIAXone (ROCEPHIN) 2 g in sodium chloride 0.9 % 100 mL IVPB     2 g 200 mL/hr over 30 Minutes Intravenous Every 24 hours 12/20/19 0027 02/02/20 2359     Subjective: No new complaints Feeling well  Objective: Vitals:   01/24/20 1518 01/24/20 1932 01/25/20 0433 01/25/20 0915  BP: 108/68 132/80 110/73 105/76  Pulse: 76 91 76 97  Resp: 16   17  Temp: 98.4 F (36.9 C) 98.1 F (36.7 C) 97.7 F (36.5 C) 97.8 F (36.6 C)  TempSrc: Oral Oral Oral Oral  SpO2: 100% 99% 98% 99%  Weight:      Height:        Intake/Output Summary (Last 24 hours) at 01/25/2020 1613 Last data filed at 01/25/2020 1500 Gross per 24 hour  Intake 490 ml  Output --  Net 490 ml   Filed Weights   12/20/19 0135  Weight: 71.5 kg    Examination:  General: No acute distress. Cardiovascular: Heart sounds show Michael Lester regular rate, and rhythm.  Lungs: Clear to auscultation bilaterally . Abdomen: Soft, nontender, nondistended  Neurological: Alert and oriented 3. Moves all extremities 4 . Cranial nerves II through XII  grossly intact. Skin: Warm and dry. No rashes or lesions. Extremities: No clubbing or cyanosis. No edema.  Data Reviewed: I have personally reviewed following labs and imaging studies  CBC: Recent Labs  Lab 01/20/20 0420 01/22/20 0354 01/23/20 0607 01/24/20 0409 01/25/20 0346  WBC 3.8* 3.8* 3.5* 4.0 3.9*  NEUTROABS 1.4* 1.4* 1.3* 1.6* 1.6*  HGB 12.5* 11.9* 12.2* 12.4* 12.6*  HCT 37.3* 36.0* 37.2* 37.6* 37.9*  MCV 80.9 81.4 81.8 82.8 81.7  PLT 143* 125* 127* 134* 962*   Basic Metabolic Panel: Recent Labs  Lab 01/20/20 0420 01/22/20 0354 01/23/20 0607 01/24/20 0409 01/25/20 0346  NA 139 140 139 139 139  K 4.0 3.9 3.6 3.8 4.0  CL 105 106 104 105 104  CO2 27 25 24 25 25   GLUCOSE 108* 101* 83 96 91  BUN 15 19 17 19 20   CREATININE  0.94 0.97 0.91 0.97 0.96  CALCIUM 9.3 9.1 9.0 9.1 8.9  MG 1.8 1.8 1.7 1.7 1.8  PHOS 4.7* 4.9* 4.7* 4.8* 4.8*   GFR: Estimated Creatinine Clearance: 108.6 mL/min (by C-G formula based on SCr of 0.96 mg/dL). Liver Function Tests: Recent Labs  Lab 01/20/20 0420 01/22/20 0354 01/23/20 0607 01/24/20 0409 01/25/20 0346  AST 33 52* 70* 86* 110*  ALT 59* 108* 156* 198* 256*  ALKPHOS 93 86 89 88 93  BILITOT 0.5 0.7 0.7 0.5 0.4  PROT 6.5 6.3* 6.2* 6.4* 6.4*  ALBUMIN 3.6 3.5 3.5 3.5 3.6   No results for input(s): LIPASE, AMYLASE in the last 168 hours. No results for input(s): AMMONIA in the last 168 hours. Coagulation Profile: No results for input(s): INR, PROTIME in the last 168 hours. Cardiac Enzymes: No results for input(s): CKTOTAL, CKMB, CKMBINDEX, TROPONINI in the last 168 hours. BNP (last 3 results) No results for input(s): PROBNP in the last 8760 hours. HbA1C: No results for input(s): HGBA1C in the last 72 hours. CBG: No results for input(s): GLUCAP in the last 168 hours. Lipid Profile: No results for input(s): CHOL, HDL, LDLCALC, TRIG, CHOLHDL, LDLDIRECT in the last 72 hours. Thyroid Function Tests: No results for input(s):  TSH, T4TOTAL, FREET4, T3FREE, THYROIDAB in the last 72 hours. Anemia Panel: No results for input(s): VITAMINB12, FOLATE, FERRITIN, TIBC, IRON, RETICCTPCT in the last 72 hours. Sepsis Labs: No results for input(s): PROCALCITON, LATICACIDVEN in the last 168 hours.  No results found for this or any previous visit (from the past 240 hour(s)).       Radiology Studies: US Abdomen Limited RUQ  Result Date: 01/24/2020 CLINICAL DATA:  Abnormal liver function tests. EXAM: ULTRASOUND ABDOMEN LIMITED RIGHT UPPER QUADRANT COMPARISON:  None. FINDINGS: Gallbladder: Mild scattered debris floating in the gallbladder with no shadowing gallstones. No gallbladder wall thickening. No pericholecystic fluid. No sonographic Murphy sign. Common bile duct: Diameter: 2 mm Liver: No focal lesion identified. Within normal limits in parenchymal echogenicity. Portal vein is patent on color Doppler imaging with normal direction of blood flow towards the liver. Other: None. IMPRESSION: Mild floating debris in the otherwise normal gallbladder. No cholelithiasis. No biliary ductal dilatation. Normal liver. Electronically Signed   By: Ilona Sorrel M.D.   On: 01/24/2020 18:01        Scheduled Meds: . Chlorhexidine Gluconate Cloth  6 each Topical Daily  . gabapentin  100 mg Oral TID  . ibuprofen  400 mg Oral TID  . miconazole nitrate   Topical BID  . nicotine  14 mg Transdermal Daily  . sodium chloride flush  10-40 mL Intracatheter Q12H   Continuous Infusions: . cefTRIAXone (ROCEPHIN)  IV 2 g (01/25/20 0110)  . lactated ringers 200 mL/hr at 12/22/19 2004  . vancomycin 1,000 mg (01/25/20 0419)     LOS: 37 days    Time spent: over 30 min    Fayrene Helper, MD [facility]   To contact the attending provider between 7A-7P or the covering provider during after hours 7P-7A, please log into the web site [email or web address] and access using universal [place] password for that web site. If you do not have the  password, please call the hospital operator.  01/25/2020, 4:13 PM

## 2020-01-25 NOTE — Plan of Care (Signed)
  Problem: Education: Goal: Knowledge of General Education information will improve Description Including pain rating scale, medication(s)/side effects and non-pharmacologic comfort measures Outcome: Progressing   Problem: Health Behavior/Discharge Planning: Goal: Ability to manage health-related needs will improve Outcome: Progressing   

## 2020-01-26 LAB — COMPREHENSIVE METABOLIC PANEL
ALT: 275 U/L — ABNORMAL HIGH (ref 0–44)
AST: 106 U/L — ABNORMAL HIGH (ref 15–41)
Albumin: 3.5 g/dL (ref 3.5–5.0)
Alkaline Phosphatase: 94 U/L (ref 38–126)
Anion gap: 9 (ref 5–15)
BUN: 17 mg/dL (ref 6–20)
CO2: 27 mmol/L (ref 22–32)
Calcium: 9.2 mg/dL (ref 8.9–10.3)
Chloride: 104 mmol/L (ref 98–111)
Creatinine, Ser: 0.95 mg/dL (ref 0.61–1.24)
GFR calc Af Amer: >60 mL/min (ref >60)
GFR calc non Af Amer: >60 mL/min (ref >60)
Glucose, Bld: 101 mg/dL — ABNORMAL HIGH (ref 70–99)
Potassium: 3.9 mmol/L (ref 3.5–5.1)
Sodium: 140 mmol/L (ref 135–145)
Total Bilirubin: 0.6 mg/dL (ref 0.3–1.2)
Total Protein: 6.1 g/dL — ABNORMAL LOW (ref 6.5–8.1)

## 2020-01-26 LAB — CBC WITH DIFFERENTIAL/PLATELET
Abs Immature Granulocytes: 0.01 10*3/uL (ref 0.00–0.07)
Basophils Absolute: 0 10*3/uL (ref 0.0–0.1)
Basophils Relative: 1 %
Eosinophils Absolute: 0.2 10*3/uL (ref 0.0–0.5)
Eosinophils Relative: 6 %
HCT: 37.3 % — ABNORMAL LOW (ref 39.0–52.0)
Hemoglobin: 12.2 g/dL — ABNORMAL LOW (ref 13.0–17.0)
Immature Granulocytes: 0 %
Lymphocytes Relative: 46 %
Lymphs Abs: 1.7 10*3/uL (ref 0.7–4.0)
MCH: 26.6 pg (ref 26.0–34.0)
MCHC: 32.7 g/dL (ref 30.0–36.0)
MCV: 81.4 fL (ref 80.0–100.0)
Monocytes Absolute: 0.4 10*3/uL (ref 0.1–1.0)
Monocytes Relative: 12 %
Neutro Abs: 1.3 10*3/uL — ABNORMAL LOW (ref 1.7–7.7)
Neutrophils Relative %: 35 %
Platelets: 122 10*3/uL — ABNORMAL LOW (ref 150–400)
RBC: 4.58 MIL/uL (ref 4.22–5.81)
RDW: 15.7 % — ABNORMAL HIGH (ref 11.5–15.5)
WBC: 3.6 10*3/uL — ABNORMAL LOW (ref 4.0–10.5)
nRBC: 0 % (ref 0.0–0.2)

## 2020-01-26 LAB — PHOSPHORUS: Phosphorus: 4.9 mg/dL — ABNORMAL HIGH (ref 2.5–4.6)

## 2020-01-26 LAB — MAGNESIUM: Magnesium: 1.9 mg/dL (ref 1.7–2.4)

## 2020-01-26 NOTE — Progress Notes (Signed)
PROGRESS NOTE    NAPOLEAN SIA  ASN:[id number] DOB: 03-Mar-1986 DOA: 12/19/2019 PCP: Patient, [name]   Brief Narrative: [name] an 34 y.o.WM PMHx IVDU   Presents to [facility] after found unresponsive complaining of headache, neck pain right shoulder pain, he was found to be septic with tachycardia and leukocytosis CT of the C-spine show some lucency in C6-C7 MRI of the spine showed discitis and osteomyelitis of C6-C7 and paravertebral edema resulting in phlegmon with with spinal stenosis and cord mass-effect.   He's s/p anterior cervical discectomy with interbody fusion by Dr. Trenton Gammon on 2/23.  Currently is completing prolonged course of vanc/ceftriaxone and rifampin.  Rifampin was discontinued due to worsening LFT's.  Will complete abx course on 4/6 and at that point can discharge on doxycycline BID with follow up with ID on April 28th.  Assessment & Plan:   Principal Problem:   Osteomyelitis of C6-7 Active Problems:   Injection of illicit drug within last 12 months   Syncope   Abscess in epidural space of cervical spine   Opioid dependence, daily use (HCC)   Chronic hepatitis C without hepatic coma (HCC)   Severe sepsis (HCC)   Hepatitis C antibody positive in blood  Sepsis due to discitis/osteomyelitis of C6-7: -Anterior cervical discectomy with interbody fusion by Dr. Trenton Gammon on 12/22/2019. -Blood cultures show coag negative staph 1 out of 2 bottles likely contaminant. -Repeated blood cultures on 12/20/2019 have remained negative till date. - wound cx on 2/23 negative -Patient will continue IV vancomycin Rocephin and rifampin Madysin Crisp minimum of 6 weeks Started 01/18/2020.  (Stop date February 02, 2020) - will d/c rifampin with rising LFT's, continue to monitor, discussed with ID, Dr. Tommy Medal -3/23 ordered removal of foam dressing and Steri-Strips overnight.  Incision site healed well negative sign of infection.  Patient order to cover with Tegaderm whenever he showers - schedule  ibuprofen for pain, continue norco prn - ID recommending IV abx until April 6, then discharge on doxycycline BID (give 30 day supply of 100 mg BID at discharge - arrange for [facility] pharmacy to deliver to bed prior to discharge) - he's to follow up in ID clinic on April 28th  Hepatitis C: follow with ID outpatient  recurrent syncope: -Echo was unremarkable EKG no significant findings question due to drug use in the setting of sepsis.  Injection of illicit drug within last 12 months: -He relates he went to detox about 4 weeks prior to admission -On admission no urine tox screen obtained  Normocytic anemia: -Likely due to anemia of chronic disease. -We will need follow-up as an outpatient.  Constipation: -Resolved with MiraLAX.  Tinea cruris -Miconazole BID  Thrombocytopenia Follow, consider medications if continuing to worsen (rifampin? Now d/c'd) fluctuating, continue to follow  Elevated LFTs: Ctm, improved AST today, ALT mildly worse, consider meds (rifampin) or related to hep c Discussed meds with pharmacy Rifampin d/c'd after discussion with ID Ceftriaxone also rarely associated with elevated LFT's (3% per UTD), will continue to monitor  RUQ [place] - mild floating debris, otherwise normal gallbladder.    DVT prophylaxis: SCD Code Status: full  Family Communication: none at bedside Disposition Plan:  . Patient came from: home            . Anticipated d/c place:home . Barriers to d/c OR conditions which need to be met to effect Shantina Chronister safe d/c: pending completion of IV abx   Consultants:   ID  Neurosurgery  Procedures:  3/23  C5-6, C6-7 anterior cervical discectomy with interbody fusion utilizing interbody allograft wedges and anterior plate instrumentation.  Antimicrobials:  Anti-infectives (From admission, onward)   Start     Dose/Rate Route Frequency Ordered Stop   01/13/20 1600  vancomycin (VANCOCIN) IVPB 1000 mg/200 mL premix     1,000 mg 200 mL/hr over 60  Minutes Intravenous Every 12 hours 01/13/20 1447 02/03/20 0359   01/13/20 1446  vancomycin (VANCOCIN) IVPB 1000 mg/200 mL premix  Status:  Discontinued     1,000 mg 200 mL/hr over 60 Minutes Intravenous Every 12 hours 01/13/20 1446 01/13/20 1447   01/11/20 1400  vancomycin (VANCOREADY) IVPB 750 mg/150 mL  Status:  Discontinued     750 mg 150 mL/hr over 60 Minutes Intravenous Every 12 hours 01/11/20 0223 01/13/20 1446   12/31/19 1400  vancomycin (VANCOCIN) IVPB 1000 mg/200 mL premix  Status:  Discontinued     1,000 mg 200 mL/hr over 60 Minutes Intravenous Every 12 hours 12/31/19 1214 01/11/20 0223   12/26/19 0800  rifampin (RIFADIN) capsule 300 mg  Status:  Discontinued     300 mg Oral 2 times daily with meals 12/25/19 1037 01/24/20 1451   12/22/19 1938  bacitracin 50,000 Units in sodium chloride 0.9 % 500 mL irrigation  Status:  Discontinued       As needed 12/22/19 1939 12/22/19 2048   12/22/19 1821  ceFAZolin (ANCEF) 2-4 GM/100ML-% IVPB    Note to Pharmacy: Rejeana Brock   : cabinet override      12/22/19 1821 12/23/19 0629   12/22/19 1000  vancomycin (VANCOREADY) IVPB 1250 mg/250 mL  Status:  Discontinued     1,250 mg 166.7 mL/hr over 90 Minutes Intravenous Every 12 hours 12/21/19 2301 12/31/19 0339   12/20/19 0600  vancomycin (VANCOREADY) IVPB 1750 mg/350 mL  Status:  Discontinued     1,750 mg 175 mL/hr over 120 Minutes Intravenous Every 12 hours 12/20/19 0042 12/21/19 2301   12/20/19 0030  cefTRIAXone (ROCEPHIN) 2 g in sodium chloride 0.9 % 100 mL IVPB     2 g 200 mL/hr over 30 Minutes Intravenous Every 24 hours 12/20/19 0027 02/02/20 2359     Subjective: No new complaints  Objective: Vitals:   01/25/20 1618 01/25/20 2021 01/26/20 0349 01/26/20 1350  BP: 123/77 130/81 (!) 97/57 130/89  Pulse: 87 97 72 84  Resp: 16 18 18 16   Temp: 98 F (36.7 C) 98.2 F (36.8 C) 98.2 F (36.8 C) 97.8 F (36.6 C)  TempSrc: Oral Oral Oral Oral  SpO2: 100% 99% 100% 100%  Weight:        Height:        Intake/Output Summary (Last 24 hours) at 01/26/2020 1456 Last data filed at 01/26/2020 0900 Gross per 24 hour  Intake 6540 ml  Output --  Net 6540 ml   Filed Weights   12/20/19 0135  Weight: 71.5 kg    Examination:  General: No acute distress. Cardiovascular: Heart sounds show Jaylan Hinojosa regular rate, and rhythm.  Lungs: Clear to auscultation bilaterally Abdomen: Soft, nontender, nondistended Neurological: Alert and oriented 3. Moves all extremities 4. Cranial nerves II through XII grossly intact. Skin: Warm and dry. No rashes or lesions. Extremities: No clubbing or cyanosis. No edema.  Data Reviewed: I have personally reviewed following labs and imaging studies  CBC: Recent Labs  Lab 01/22/20 0354 01/23/20 0607 01/24/20 0409 01/25/20 0346 01/26/20 0451  WBC 3.8* 3.5* 4.0 3.9* 3.6*  NEUTROABS 1.4* 1.3* 1.6* 1.6* 1.3*  HGB  11.9* 12.2* 12.4* 12.6* 12.2*  HCT 36.0* 37.2* 37.6* 37.9* 37.3*  MCV 81.4 81.8 82.8 81.7 81.4  PLT 125* 127* 134* 137* 536*   Basic Metabolic Panel: Recent Labs  Lab 01/22/20 0354 01/23/20 0607 01/24/20 0409 01/25/20 0346 01/26/20 0451  NA 140 139 139 139 140  K 3.9 3.6 3.8 4.0 3.9  CL 106 104 105 104 104  CO2 25 24 25 25 27   GLUCOSE 101* 83 96 91 101*  BUN 19 17 19 20 17   CREATININE 0.97 0.91 0.97 0.96 0.95  CALCIUM 9.1 9.0 9.1 8.9 9.2  MG 1.8 1.7 1.7 1.8 1.9  PHOS 4.9* 4.7* 4.8* 4.8* 4.9*   GFR: Estimated Creatinine Clearance: 109.8 mL/min (by C-G formula based on SCr of 0.95 mg/dL). Liver Function Tests: Recent Labs  Lab 01/22/20 0354 01/23/20 0607 01/24/20 0409 01/25/20 0346 01/26/20 0451  AST 52* 70* 86* 110* 106*  ALT 108* 156* 198* 256* 275*  ALKPHOS 86 89 88 93 94  BILITOT 0.7 0.7 0.5 0.4 0.6  PROT 6.3* 6.2* 6.4* 6.4* 6.1*  ALBUMIN 3.5 3.5 3.5 3.6 3.5   No results for input(s): LIPASE, AMYLASE in the last 168 hours. No results for input(s): AMMONIA in the last 168 hours. Coagulation Profile: No results  for input(s): INR, PROTIME in the last 168 hours. Cardiac Enzymes: No results for input(s): CKTOTAL, CKMB, CKMBINDEX, TROPONINI in the last 168 hours. BNP (last 3 results) No results for input(s): PROBNP in the last 8760 hours. HbA1C: No results for input(s): HGBA1C in the last 72 hours. CBG: No results for input(s): GLUCAP in the last 168 hours. Lipid Profile: No results for input(s): CHOL, HDL, LDLCALC, TRIG, CHOLHDL, LDLDIRECT in the last 72 hours. Thyroid Function Tests: No results for input(s): TSH, T4TOTAL, FREET4, T3FREE, THYROIDAB in the last 72 hours. Anemia Panel: No results for input(s): VITAMINB12, FOLATE, FERRITIN, TIBC, IRON, RETICCTPCT in the last 72 hours. Sepsis Labs: No results for input(s): PROCALCITON, LATICACIDVEN in the last 168 hours.  No results found for this or any previous visit (from the past 240 hour(s)).       Radiology Studies: US Abdomen Limited RUQ  Result Date: 01/24/2020 CLINICAL DATA:  Abnormal liver function tests. EXAM: ULTRASOUND ABDOMEN LIMITED RIGHT UPPER QUADRANT COMPARISON:  None. FINDINGS: Gallbladder: Mild scattered debris floating in the gallbladder with no shadowing gallstones. No gallbladder wall thickening. No pericholecystic fluid. No sonographic Murphy sign. Common bile duct: Diameter: 2 mm Liver: No focal lesion identified. Within normal limits in parenchymal echogenicity. Portal vein is patent on color Doppler imaging with normal direction of blood flow towards the liver. Other: None. IMPRESSION: Mild floating debris in the otherwise normal gallbladder. No cholelithiasis. No biliary ductal dilatation. Normal liver. Electronically Signed   By: Ilona Sorrel M.D.   On: 01/24/2020 18:01        Scheduled Meds: . Chlorhexidine Gluconate Cloth  6 each Topical Daily  . gabapentin  100 mg Oral TID  . miconazole nitrate   Topical BID  . nicotine  14 mg Transdermal Daily  . sodium chloride flush  10-40 mL Intracatheter Q12H    Continuous Infusions: . cefTRIAXone (ROCEPHIN)  IV 2 g (01/26/20 0120)  . lactated ringers 200 mL/hr at 12/22/19 2004  . vancomycin 1,000 mg (01/26/20 0353)     LOS: 38 days    Time spent: over 30 min    Fayrene Helper, MD [facility]   To contact the attending provider between 7A-7P or the covering provider  during after hours 7P-7A, please log into the web site [email or web address] and access using universal [place] password for that web site. If you do not have the password, please call the hospital operator.  01/26/2020, 2:56 PM

## 2020-01-26 NOTE — Progress Notes (Signed)
Pharmacy Antibiotic Note  Michael Lester is a 34 y.o. male admitted on 12/19/2019 with osteomyelitis of the cervical spine. Pharmacy has been consulted for vancomycin dosing.  SCr fluctuating this admission but stable around 0.8-1. Scr today is 0.95.  Afebrile. WBC normal.  Vancomycin AUC of 535 on 3/27 is therapeutic at our goal of 400-550.   Plan: Continue vancomycin 1000 mg IV q12h Tentative stop date for all antibiotics: 02/02/20 Monitor clinical progress, renal function, vanc levels as clinically indicated.   Height: 6\' 1"  (185.4 cm) Weight: 157 lb 10.1 oz (71.5 kg) IBW/kg (Calculated) : 79.9  Temp (24hrs), Avg:98.1 F (36.7 C), Min:98 F (36.7 C), Max:98.2 F (36.8 C)  Recent Labs  Lab 01/20/20 0420 01/20/20 0818 01/20/20 1530 01/22/20 0354 01/23/20 0607 01/23/20 1537 01/24/20 0409 01/25/20 0346 01/26/20 0451  WBC   < >  --   --  3.8* 3.5*  --  4.0 3.9* 3.6*  CREATININE   < >  --   --  0.97 0.91  --  0.97 0.96 0.95  VANCOTROUGH  --   --  6*  --   --  12*  --   --   --   VANCOPEAK  --  18*  --   --  35  --   --   --   --    < > = values in this interval not displayed.    Estimated Creatinine Clearance: 109.8 mL/min (by C-G formula based on SCr of 0.95 mg/dL).    Allergies  Allergen Reactions  . Shellfish Allergy Anaphylaxis    Antimicrobials this admission: Vanc 2/20 >>4/6 CTX 2/21 >>4/6 Rifampin 2/27 >>3/28 Cefepime/Flagyl x1 2/20  Dose adjustments this admission: Vanc 1250 mg q12h (2/26) >> 1g q12h (3/4) >> 750mg  q12h (3/14) >> 1000 mg q12h (3/17) >>  Cultures: 2/20 BCx [facility] - 1/2 MRSE 2/21 MRSA PCR - negative 2/21 BCx - negative 2/23 cervical wound - negative  Thank you for involving pharmacy in this patient's care.  3/21, PharmD, BCPS Clinical Pharmacist Clinical phone for 01/26/2020 until 3p is Loura Back 01/26/2020 12:37 PM  **Pharmacist phone directory can be found on [email or web address] listed under [facility]**

## 2020-01-27 DIAGNOSIS — R748 Abnormal levels of other serum enzymes: Secondary | ICD-10-CM

## 2020-01-27 LAB — COMPREHENSIVE METABOLIC PANEL
ALT: 296 U/L — ABNORMAL HIGH (ref 0–44)
AST: 102 U/L — ABNORMAL HIGH (ref 15–41)
Albumin: 3.5 g/dL (ref 3.5–5.0)
Alkaline Phosphatase: 92 U/L (ref 38–126)
Anion gap: 9 (ref 5–15)
BUN: 17 mg/dL (ref 6–20)
CO2: 25 mmol/L (ref 22–32)
Calcium: 9 mg/dL (ref 8.9–10.3)
Chloride: 104 mmol/L (ref 98–111)
Creatinine, Ser: 1 mg/dL (ref 0.61–1.24)
GFR calc Af Amer: >60 mL/min (ref >60)
GFR calc non Af Amer: >60 mL/min (ref >60)
Glucose, Bld: 117 mg/dL — ABNORMAL HIGH (ref 70–99)
Potassium: 3.9 mmol/L (ref 3.5–5.1)
Sodium: 138 mmol/L (ref 135–145)
Total Bilirubin: <0.1 mg/dL — ABNORMAL LOW (ref 0.3–1.2)
Total Protein: 6 g/dL — ABNORMAL LOW (ref 6.5–8.1)

## 2020-01-27 LAB — CBC WITH DIFFERENTIAL/PLATELET
Abs Immature Granulocytes: 0.01 10*3/uL (ref 0.00–0.07)
Basophils Absolute: 0 10*3/uL (ref 0.0–0.1)
Basophils Relative: 1 %
Eosinophils Absolute: 0.2 10*3/uL (ref 0.0–0.5)
Eosinophils Relative: 6 %
HCT: 37.5 % — ABNORMAL LOW (ref 39.0–52.0)
Hemoglobin: 12.3 g/dL — ABNORMAL LOW (ref 13.0–17.0)
Immature Granulocytes: 0 %
Lymphocytes Relative: 43 %
Lymphs Abs: 1.5 10*3/uL (ref 0.7–4.0)
MCH: 27.5 pg (ref 26.0–34.0)
MCHC: 32.8 g/dL (ref 30.0–36.0)
MCV: 83.7 fL (ref 80.0–100.0)
Monocytes Absolute: 0.4 10*3/uL (ref 0.1–1.0)
Monocytes Relative: 12 %
Neutro Abs: 1.3 10*3/uL — ABNORMAL LOW (ref 1.7–7.7)
Neutrophils Relative %: 38 %
Platelets: 126 10*3/uL — ABNORMAL LOW (ref 150–400)
RBC: 4.48 MIL/uL (ref 4.22–5.81)
RDW: 15.7 % — ABNORMAL HIGH (ref 11.5–15.5)
WBC: 3.5 10*3/uL — ABNORMAL LOW (ref 4.0–10.5)
nRBC: 0 % (ref 0.0–0.2)

## 2020-01-27 LAB — PHOSPHORUS: Phosphorus: 4.7 mg/dL — ABNORMAL HIGH (ref 2.5–4.6)

## 2020-01-27 LAB — MAGNESIUM: Magnesium: 1.9 mg/dL (ref 1.7–2.4)

## 2020-01-27 NOTE — Progress Notes (Signed)
PROGRESS NOTE    Michael Lester  AGT:[id number] DOB: 05-Apr-1986 DOA: 12/19/2019 PCP: Patient, [name]   Brief Narrative: Michael Lester an 34 y.o.WM PMHx IVDU, initially presents to [facility] after found unresponsive complaining of headache, neck pain right shoulder pain, he was found to be septic with tachycardia and leukocytosis CT of the C-spine show some lucency in C6-C7 MRI of the spine showed discitis and osteomyelitis of C6-C7 and paravertebral edema resulting in phlegmon with with spinal stenosis and cord mass-effect. He's s/p anterior cervical discectomy with interbody fusion by Dr. Trenton Gammon on 2/23.  Currently is completing prolonged course of vanc/ceftriaxone and rifampin.  Rifampin was discontinued due to worsening LFT's.  Will complete abx course on 4/6 and at that point can discharge on doxycycline BID with follow up with ID on April 28th.  Subjective: No complaints, no chest pain, no shortness of breath, no abdominal pain, nausea or vomiting  Assessment & Plan:   Principal Problem:   Osteomyelitis of C6-7 Active Problems:   Injection of illicit drug within last 12 months   Syncope   Abscess in epidural space of cervical spine   Opioid dependence, daily use (HCC)   Chronic hepatitis C without hepatic coma (HCC)   Severe sepsis (HCC)   Hepatitis C antibody positive in blood  Sepsis due to discitis/osteomyelitis of C6-7: -Anterior cervical discectomy with interbody fusion by Dr. Trenton Gammon on 12/22/2019. -Blood cultures show coag negative staph 1 out of 2 bottles likely contaminant. -Repeated blood cultures on 12/20/2019 have remained negative till date. -wound cx on 2/23 negative -Patient will continue IV vancomycin Rocephin and rifampin a minimum of 6 weeks Started 01/18/2020.  (Stop date February 02, 2020) -rifampin has been discontinued with rising LFT's, continue to monitor, discussed with ID, Dr. Florene Glen discussed with Dr. Tommy Medal -3/23 ordered removal of foam dressing and  Steri-Strips overnight.  Incision site healed well negative sign of infection.  Patient order to cover with Tegaderm whenever he showers -schedule ibuprofen for pain, continue norco prn -ID recommending IV abx until April 6, then discharge on doxycycline BID (give 30 day supply of 100 mg BID at discharge - arrange for [facility] pharmacy to deliver to bed prior to discharge) - he's to follow up in ID clinic on April 28th  Hepatitis C -follow with ID outpatient  Recurrent syncope -Echo was unremarkable EKG no significant findings question due to drug use in the setting of sepsis.  Injection of illicit drug within last 12 months -He relates he went to detox about 4 weeks prior to admission -On admission no urine tox screen obtained  Normocytic anemia -Likely due to anemia of chronic disease.  Constipation -Resolved with MiraLAX.  Tinea cruris -Miconazole BID  Thrombocytopenia -Follow  Elevated LFTs -LFTs still little bit elevated but overall stable, not getting significantly worse, continue to monitor.  Now off rifampin -RUQ [place] - mild floating debris, otherwise normal gallbladder.    Scheduled Meds: . Chlorhexidine Gluconate Cloth  6 each Topical Daily  . gabapentin  100 mg Oral TID  . miconazole nitrate   Topical BID  . nicotine  14 mg Transdermal Daily  . sodium chloride flush  10-40 mL Intracatheter Q12H   Continuous Infusions: . cefTRIAXone (ROCEPHIN)  IV 2 g (01/26/20 2248)  . lactated ringers 200 mL/hr at 12/22/19 2004  . vancomycin 1,000 mg (01/27/20 0424)   PRN Meds:.cyclobenzaprine, ibuprofen, menthol-cetylpyridinium **OR** phenol, ondansetron **OR** ondansetron (ZOFRAN) IV, oxyCODONE **OR** oxyCODONE, polyethylene glycol, senna-docusate, sodium  chloride flush   DVT prophylaxis: SCD Code Status: full  Family Communication: none at bedside Disposition Plan:  . Patient came from: home            . Anticipated d/c place:home . Barriers to d/c OR conditions which  need to be met to effect a safe d/c: pending completion of IV abx   Consultants:   ID  Neurosurgery  Procedures:  3/23 C5-6, C6-7 anterior cervical discectomy with interbody fusion utilizing interbody allograft wedges and anterior plate instrumentation.  Antimicrobials:  Vancomycin\Ceftriaxone  Objective: Vitals:   01/26/20 1350 01/26/20 1930 01/27/20 0351 01/27/20 0813  BP: 130/89 103/80 (!) 105/59 105/66  Pulse: 84 81 81 79  Resp: _0 Temp: 97.8 F (36.6 C) 98.5 F (36.9 C) 98.2 F (36.8 C) 97.8 F (36.6 C)  TempSrc: Oral Oral Oral Oral  SpO2: 100% 100% 96% 97%  Weight:      Height:        Intake/Output Summary (Last 24 hours) at 01/27/2020 1138 Last data filed at 01/27/2020 0600 Gross per 24 hour  Intake 1936.08 ml  Output --  Net 1936.08 ml   Filed Weights   12/20/19 0135  Weight: 71.5 kg    Examination:  General: Comfortable, sleeping, wakes up easily and is in no distress Cardiovascular: Regular rate and rhythm, no murmurs Lungs: Clear bilaterally, no wheezing, no crackles Abdomen: Soft, NT, ND   Data Reviewed: I have personally reviewed following labs and imaging studies  CBC: Recent Labs  Lab 01/23/20 0607 01/24/20 0409 01/25/20 0346 01/26/20 0451 01/27/20 0413  WBC 3.5* 4.0 3.9* 3.6* 3.5*  NEUTROABS 1.3* 1.6* 1.6* 1.3* 1.3*  HGB 12.2* 12.4* 12.6* 12.2* 12.3*  HCT 37.2* 37.6* 37.9* 37.3* 37.5*  MCV 81.8 82.8 81.7 81.4 83.7  PLT 127* 134* 137* 122* 357*   Basic Metabolic Panel: Recent Labs  Lab 01/23/20 0607 01/24/20 0409 01/25/20 0346 01/26/20 0451 01/27/20 0413  NA 139 139 139 140 138  K 3.6 3.8 4.0 3.9 3.9  CL 104 105 104 104 104  CO2 _1 GLUCOSE 83 96 91 101* 117*  BUN _2 CREATININE 0.91 0.97 0.96 0.95 1.00  CALCIUM 9.0 9.1 8.9 9.2 9.0  MG 1.7 1.7 1.8 1.9 1.9  PHOS 4.7* 4.8* 4.8* 4.9* 4.7*   GFR: Estimated Creatinine Clearance: 104.3 mL/min (by C-G formula based on SCr of 1  mg/dL). Liver Function Tests: Recent Labs  Lab 01/23/20 0607 01/24/20 0409 01/25/20 0346 01/26/20 0451 01/27/20 0413  AST 70* 86* 110* 106* 102*  ALT 156* 198* 256* 275* 296*  ALKPHOS 89 88 93 94 92  BILITOT 0.7 0.5 0.4 0.6 <0.1*  PROT 6.2* 6.4* 6.4* 6.1* 6.0*  ALBUMIN 3.5 3.5 3.6 3.5 3.5   No results for input(s): LIPASE, AMYLASE in the last 168 hours. No results for input(s): AMMONIA in the last 168 hours. Coagulation Profile: No results for input(s): INR, PROTIME in the last 168 hours. Cardiac Enzymes: No results for input(s): CKTOTAL, CKMB, CKMBINDEX, TROPONINI in the last 168 hours. BNP (last 3 results) No results for input(s): PROBNP in the last 8760 hours. HbA1C: No results for input(s): HGBA1C in the last 72 hours. CBG: No results for input(s): GLUCAP in the last 168 hours. Lipid Profile: No results for input(s): CHOL, HDL, LDLCALC, TRIG, CHOLHDL, LDLDIRECT in the last 72 hours. Thyroid Function Tests: No results for input(s): TSH, T4TOTAL, FREET4, T3FREE, THYROIDAB in the  last 72 hours. Anemia Panel: No results for input(s): VITAMINB12, FOLATE, FERRITIN, TIBC, IRON, RETICCTPCT in the last 72 hours. Sepsis Labs: No results for input(s): PROCALCITON, LATICACIDVEN in the last 168 hours.  No results found for this or any previous visit (from the past 240 hour(s)).       Radiology Studies: No results found.      Scheduled Meds: . Chlorhexidine Gluconate Cloth  6 each Topical Daily  . gabapentin  100 mg Oral TID  . miconazole nitrate   Topical BID  . nicotine  14 mg Transdermal Daily  . sodium chloride flush  10-40 mL Intracatheter Q12H   Continuous Infusions: . cefTRIAXone (ROCEPHIN)  IV 2 g (01/26/20 2248)  . lactated ringers 200 mL/hr at 12/22/19 2004  . vancomycin 1,000 mg (01/27/20 0424)     LOS: 3 days    Caroleena Paolini M. Cruzita Lederer, MD, PhD [facility]  Between 7 am - 7 pm I am available, please contact me via Amion or Securechat Between 7  pm - 7 am I am not available, please contact night coverage MD/APP via Amion

## 2020-01-27 NOTE — Progress Notes (Signed)
PICC Guard was off upon doing DBIV by this IV VAST Nurse, line currently infusing Saline at [facility]. Primary Nurse said she is about to run the 0400 VANC IV to infuse for 2 hours , followed by another IV abt x 1 hour. RN notified to consult IV VAST once all IV meds completely given to place PICC Guard. Verbalized understanding.

## 2020-01-27 NOTE — Plan of Care (Signed)
  Problem: Health Behavior/Discharge Planning: Goal: Ability to manage health-related needs will improve Outcome: Completed/Met   Problem: Activity: Goal: Risk for activity intolerance will decrease Outcome: Completed/Met

## 2020-01-28 NOTE — Plan of Care (Signed)
  Problem: Education: Goal: Knowledge of General Education information will improve Description: Including pain rating scale, medication(s)/side effects and non-pharmacologic comfort measures Outcome: Progressing   Problem: Clinical Measurements: Goal: Ability to maintain clinical measurements within normal limits will improve Outcome: Progressing Goal: Will remain free from infection Outcome: Progressing Goal: Diagnostic test results will improve Outcome: Progressing Goal: Respiratory complications will improve Outcome: Progressing Goal: Cardiovascular complication will be avoided Outcome: Progressing   Problem: Nutrition: Goal: Adequate nutrition will be maintained Outcome: Progressing   Problem: Coping: Goal: Level of anxiety will decrease Outcome: Progressing   Problem: Elimination: Goal: Will not experience complications related to bowel motility Outcome: Progressing Goal: Will not experience complications related to urinary retention Outcome: Progressing   Problem: Pain Managment: Goal: General experience of comfort will improve Outcome: Progressing   Problem: Safety: Goal: Ability to remain free from injury will improve Outcome: Progressing   Problem: Skin Integrity: Goal: Risk for impaired skin integrity will decrease Outcome: Progressing   Problem: Elimination: Goal: Will not experience complications related to urinary retention Outcome: Progressing

## 2020-01-28 NOTE — Plan of Care (Signed)
  Problem: Clinical Measurements: Goal: Will remain free from infection Outcome: Progressing   Problem: Pain Managment: Goal: General experience of comfort will improve Outcome: Progressing   

## 2020-01-28 NOTE — Progress Notes (Signed)
PROGRESS NOTE    Michael Lester  MRN:[id number] DOB: 07/07/1986 DOA: 12/19/2019 PCP: Patient, [name]   Brief Narrative: Michael Lesteris an 33 y.o.WM PMHx IVDU, initially presents to [facility] after found unresponsive complaining of headache, neck pain right shoulder pain, he was found to be septic with tachycardia and leukocytosis CT of the C-spine show some lucency in C6-C7 MRI of the spine showed discitis and osteomyelitis of C6-C7 and paravertebral edema resulting in phlegmon with with spinal stenosis and cord mass-effect. He's s/p anterior cervical discectomy with interbody fusion by Dr. Poole on 2/23.  Currently is completing prolonged course of vanc/ceftriaxone and rifampin.  Rifampin was discontinued due to worsening LFT's.  Will complete abx course on 4/6 and at that point can discharge on doxycycline BID with follow up with ID on April 28th.  Subjective: Sleeping, appears comfortable, wakes up easily with no complaints  Assessment & Plan:   Principal Problem:   Osteomyelitis of C6-7 Active Problems:   Injection of illicit drug within last 12 months   Syncope   Abscess in epidural space of cervical spine   Opioid dependence, daily use (HCC)   Chronic hepatitis C without hepatic coma (HCC)   Severe sepsis (HCC)   Hepatitis C antibody positive in blood  Sepsis due to discitis/osteomyelitis of C6-7: -Anterior cervical discectomy with interbody fusion by Dr. Poole on 12/22/2019. -Blood cultures show coag negative staph 1 out of 2 bottles likely contaminant. -Repeated blood cultures on 12/20/2019 have remained negative till date. -wound cx on 2/23 negative -Patient will continue IV vancomycin Rocephin and rifampin a minimum of 6 weeks Started 01/18/2020.  (Stop date February 02, 2020) -rifampin has been discontinued with rising LFT's, continue to monitor, discussed with ID, Dr. Powell discussed with Dr. Van Dam -3/23 ordered removal of foam dressing and Steri-Strips overnight.  Incision  site healed well negative sign of infection.  Patient order to cover with Tegaderm whenever he showers -schedule ibuprofen for pain, continue norco prn -ID recommending IV abx until April 6, then discharge on doxycycline BID (give 30 day supply of 100 mg BID at discharge - arrange for [facility] pharmacy to deliver to bed prior to discharge) - he's to follow up in ID clinic on April 28th  Hepatitis C -follow with ID outpatient  Recurrent syncope -Echo was unremarkable EKG no significant findings question due to drug use in the setting of sepsis.  Injection of illicit drug within last 12 months -He relates he went to detox about 4 weeks prior to admission -On admission no urine tox screen obtained  Normocytic anemia -Likely due to anemia of chronic disease.  Constipation -Resolved with MiraLAX.  Tinea cruris -Miconazole BID  Thrombocytopenia -Follow  Elevated LFTs -LFTs still little bit elevated but overall stable, not getting significantly worse, continue to monitor.  Now off rifampin -RUQ [place] - mild floating debris, otherwise normal gallbladder.   -Repeat LFTs tomorrow  Scheduled Meds: . Chlorhexidine Gluconate Cloth  6 each Topical Daily  . gabapentin  100 mg Oral TID  . miconazole nitrate   Topical BID  . nicotine  14 mg Transdermal Daily  . sodium chloride flush  10-40 mL Intracatheter Q12H   Continuous Infusions: . cefTRIAXone (ROCEPHIN)  IV 2 g (01/28/20 0127)  . lactated ringers 200 mL/hr at 12/22/19 2004  . vancomycin 1,000 mg (01/28/20 0415)   PRN Meds:.cyclobenzaprine, ibuprofen, menthol-cetylpyridinium **OR** phenol, ondansetron **OR** ondansetron (ZOFRAN) IV, oxyCODONE **OR** oxyCODONE, polyethylene glycol, senna-docusate, sodium chloride flush     DVT prophylaxis: SCD Code Status: full  Family Communication: none at bedside Disposition Plan:  . Patient came from: home            . Anticipated d/c place:home . Barriers to d/c OR conditions which need to be  met to effect a safe d/c: pending completion of IV abx   Consultants:   ID  Neurosurgery  Procedures:  3/23 C5-6, C6-7 anterior cervical discectomy with interbody fusion utilizing interbody allograft wedges and anterior plate instrumentation.  Antimicrobials:  Vancomycin\Ceftriaxone  Objective: Vitals:   01/27/20 1500 01/27/20 2019 01/28/20 0445 01/28/20 0849  BP: 130/75 110/75 111/75 112/76  Pulse: 80 91 80 78  Resp: _0 Temp: 98.2 F (36.8 C) 98.1 F (36.7 C) 97.8 F (36.6 C) 98.1 F (36.7 C)  TempSrc: Oral Oral Oral Oral  SpO2: 100% 99% 97% 99%  Weight:      Height:        Intake/Output Summary (Last 24 hours) at 01/28/2020 1014 Last data filed at 01/28/2020 0931 Gross per 24 hour  Intake 862.68 ml  Output --  Net 862.68 ml   Filed Weights   12/20/19 0135  Weight: 71.5 kg    Examination:  General: No distress   Data Reviewed: I have personally reviewed following labs and imaging studies  CBC: Recent Labs  Lab 01/23/20 0607 01/24/20 0409 01/25/20 0346 01/26/20 0451 01/27/20 0413  WBC 3.5* 4.0 3.9* 3.6* 3.5*  NEUTROABS 1.3* 1.6* 1.6* 1.3* 1.3*  HGB 12.2* 12.4* 12.6* 12.2* 12.3*  HCT 37.2* 37.6* 37.9* 37.3* 37.5*  MCV 81.8 82.8 81.7 81.4 83.7  PLT 127* 134* 137* 122* 250*   Basic Metabolic Panel: Recent Labs  Lab 01/23/20 0607 01/24/20 0409 01/25/20 0346 01/26/20 0451 01/27/20 0413  NA 139 139 139 140 138  K 3.6 3.8 4.0 3.9 3.9  CL 104 105 104 104 104  CO2 _1 GLUCOSE 83 96 91 101* 117*  BUN _2 CREATININE 0.91 0.97 0.96 0.95 1.00  CALCIUM 9.0 9.1 8.9 9.2 9.0  MG 1.7 1.7 1.8 1.9 1.9  PHOS 4.7* 4.8* 4.8* 4.9* 4.7*   GFR: Estimated Creatinine Clearance: 104.3 mL/min (by C-G formula based on SCr of 1 mg/dL). Liver Function Tests: Recent Labs  Lab 01/23/20 0607 01/24/20 0409 01/25/20 0346 01/26/20 0451 01/27/20 0413  AST 70* 86* 110* 106* 102*  ALT 156* 198* 256* 275* 296*  ALKPHOS 89 88 93 94 92   BILITOT 0.7 0.5 0.4 0.6 <0.1*  PROT 6.2* 6.4* 6.4* 6.1* 6.0*  ALBUMIN 3.5 3.5 3.6 3.5 3.5   No results for input(s): LIPASE, AMYLASE in the last 168 hours. No results for input(s): AMMONIA in the last 168 hours. Coagulation Profile: No results for input(s): INR, PROTIME in the last 168 hours. Cardiac Enzymes: No results for input(s): CKTOTAL, CKMB, CKMBINDEX, TROPONINI in the last 168 hours. BNP (last 3 results) No results for input(s): PROBNP in the last 8760 hours. HbA1C: No results for input(s): HGBA1C in the last 72 hours. CBG: No results for input(s): GLUCAP in the last 168 hours. Lipid Profile: No results for input(s): CHOL, HDL, LDLCALC, TRIG, CHOLHDL, LDLDIRECT in the last 72 hours. Thyroid Function Tests: No results for input(s): TSH, T4TOTAL, FREET4, T3FREE, THYROIDAB in the last 72 hours. Anemia Panel: No results for input(s): VITAMINB12, FOLATE, FERRITIN, TIBC, IRON, RETICCTPCT in the last 72 hours. Sepsis Labs: No results for input(s): PROCALCITON, LATICACIDVEN in the last  168 hours.  No results found for this or any previous visit (from the past 240 hour(s)).       Radiology Studies: No results found.      Scheduled Meds: . Chlorhexidine Gluconate Cloth  6 each Topical Daily  . gabapentin  100 mg Oral TID  . miconazole nitrate   Topical BID  . nicotine  14 mg Transdermal Daily  . sodium chloride flush  10-40 mL Intracatheter Q12H   Continuous Infusions: . cefTRIAXone (ROCEPHIN)  IV 2 g (01/28/20 0127)  . lactated ringers 200 mL/hr at 12/22/19 2004  . vancomycin 1,000 mg (01/28/20 0415)     LOS: 40 days     M. , MD, PhD [facility]  Between 7 am - 7 pm I am available, please contact me via Amion or Securechat Between 7 pm - 7 am I am not available, please contact night coverage MD/APP via Amion    

## 2020-01-29 LAB — CBC WITH DIFFERENTIAL/PLATELET
Abs Immature Granulocytes: 0.01 10*3/uL (ref 0.00–0.07)
Basophils Absolute: 0 10*3/uL (ref 0.0–0.1)
Basophils Relative: 1 %
Eosinophils Absolute: 0.3 10*3/uL (ref 0.0–0.5)
Eosinophils Relative: 6 %
HCT: 36.6 % — ABNORMAL LOW (ref 39.0–52.0)
Hemoglobin: 12.2 g/dL — ABNORMAL LOW (ref 13.0–17.0)
Immature Granulocytes: 0 %
Lymphocytes Relative: 43 %
Lymphs Abs: 1.7 10*3/uL (ref 0.7–4.0)
MCH: 27.2 pg (ref 26.0–34.0)
MCHC: 33.3 g/dL (ref 30.0–36.0)
MCV: 81.7 fL (ref 80.0–100.0)
Monocytes Absolute: 0.4 10*3/uL (ref 0.1–1.0)
Monocytes Relative: 11 %
Neutro Abs: 1.5 10*3/uL — ABNORMAL LOW (ref 1.7–7.7)
Neutrophils Relative %: 39 %
Platelets: 131 10*3/uL — ABNORMAL LOW (ref 150–400)
RBC: 4.48 MIL/uL (ref 4.22–5.81)
RDW: 15.7 % — ABNORMAL HIGH (ref 11.5–15.5)
WBC: 3.9 10*3/uL — ABNORMAL LOW (ref 4.0–10.5)
nRBC: 0 % (ref 0.0–0.2)

## 2020-01-29 LAB — COMPREHENSIVE METABOLIC PANEL
ALT: 321 U/L — ABNORMAL HIGH (ref 0–44)
AST: 102 U/L — ABNORMAL HIGH (ref 15–41)
Albumin: 3.4 g/dL — ABNORMAL LOW (ref 3.5–5.0)
Alkaline Phosphatase: 97 U/L (ref 38–126)
Anion gap: 12 (ref 5–15)
BUN: 17 mg/dL (ref 6–20)
CO2: 26 mmol/L (ref 22–32)
Calcium: 9.1 mg/dL (ref 8.9–10.3)
Chloride: 100 mmol/L (ref 98–111)
Creatinine, Ser: 1.04 mg/dL (ref 0.61–1.24)
GFR calc Af Amer: >60 mL/min (ref >60)
GFR calc non Af Amer: >60 mL/min (ref >60)
Glucose, Bld: 124 mg/dL — ABNORMAL HIGH (ref 70–99)
Potassium: 3.6 mmol/L (ref 3.5–5.1)
Sodium: 138 mmol/L (ref 135–145)
Total Bilirubin: 0.4 mg/dL (ref 0.3–1.2)
Total Protein: 6.2 g/dL — ABNORMAL LOW (ref 6.5–8.1)

## 2020-01-29 LAB — MAGNESIUM: Magnesium: 1.8 mg/dL (ref 1.7–2.4)

## 2020-01-29 LAB — VANCOMYCIN, PEAK: Vancomycin Pk: 19 ug/mL — ABNORMAL LOW (ref 30–40)

## 2020-01-29 LAB — PHOSPHORUS: Phosphorus: 4.7 mg/dL — ABNORMAL HIGH (ref 2.5–4.6)

## 2020-01-29 NOTE — Plan of Care (Signed)
  Problem: Education: Goal: Knowledge of General Education information will improve Description: Including pain rating scale, medication(s)/side effects and non-pharmacologic comfort measures Outcome: Progressing   Problem: Pain Managment: Goal: General experience of comfort will improve Outcome: Progressing   

## 2020-01-29 NOTE — Progress Notes (Signed)
Spoke with Isabelle Course, [facility] regarding consult for vanc peak.  This order just populated VAST consult list and is now 1.5 hours late.  Per Isabelle Course okay to draw.

## 2020-01-29 NOTE — Progress Notes (Signed)
PROGRESS NOTE    Michael Lester  BJY:[id number] DOB: 1986-07-20 DOA: 12/19/2019 PCP: Patient, [name]   Brief Narrative: Michael Lester an 34 y.o.WM PMHx IVDU, initially presents to [facility] after found unresponsive complaining of headache, neck pain right shoulder pain, he was found to be septic with tachycardia and leukocytosis CT of the C-spine show some lucency in C6-C7 MRI of the spine showed discitis and osteomyelitis of C6-C7 and paravertebral edema resulting in phlegmon with with spinal stenosis and cord mass-effect. He's s/p anterior cervical discectomy with interbody fusion by Dr. Trenton Gammon on 2/23.  Currently is completing prolonged course of vanc/ceftriaxone and rifampin.  Rifampin was discontinued due to worsening LFT's.  Will complete abx course on 4/6 and at that point can discharge on doxycycline BID with follow up with ID on April 28th.  Subjective: No distress, sitting in chair  Assessment & Plan:   Principal Problem:   Osteomyelitis of C6-7 Active Problems:   Injection of illicit drug within last 12 months   Syncope   Abscess in epidural space of cervical spine   Opioid dependence, daily use (HCC)   Chronic hepatitis C without hepatic coma (HCC)   Severe sepsis (HCC)   Hepatitis C antibody positive in blood  Sepsis due to discitis/osteomyelitis of C6-7: -Anterior cervical discectomy with interbody fusion by Dr. Trenton Gammon on 12/22/2019. -Blood cultures show coag negative staph 1 out of 2 bottles likely contaminant. -Repeated blood cultures on 12/20/2019 have remained negative till date. -wound cx on 2/23 negative -Patient will continue IV vancomycin Rocephin and rifampin a minimum of 6 weeks Started 01/18/2020.  (Stop date February 02, 2020) -rifampin has been discontinued with rising LFT's, continue to monitor, discussed with ID, Dr. Florene Glen discussed with Dr. Tommy Medal -3/23 ordered removal of foam dressing and Steri-Strips overnight.  Incision site healed well negative sign of  infection.  Patient order to cover with Tegaderm whenever he showers -schedule ibuprofen for pain, continue norco prn -ID recommending IV abx until April 6, then discharge on doxycycline BID (give 30 day supply of 100 mg BID at discharge - arrange for [facility] pharmacy to deliver to bed prior to discharge) - he's to follow up in ID clinic on April 28th  Hepatitis C -follow with ID outpatient  Recurrent syncope -Echo was unremarkable EKG no significant findings question due to drug use in the setting of sepsis.  Injection of illicit drug within last 12 months -He relates he went to detox about 4 weeks prior to admission -On admission no urine tox screen obtained  Normocytic anemia -Likely due to anemia of chronic disease.  Constipation -Resolved with MiraLAX.  Tinea cruris -Miconazole BID  Thrombocytopenia -Follow  Elevated LFTs -LFTs still little bit elevated but overall stable, not getting significantly worse, continue to monitor.  Now off rifampin -RUQ [place] - mild floating debris, otherwise normal gallbladder.   -LFTs still on the high side, AST/ALT has been overall stable but still elevated, continue to monitor  Scheduled Meds: . Chlorhexidine Gluconate Cloth  6 each Topical Daily  . gabapentin  100 mg Oral TID  . miconazole nitrate   Topical BID  . nicotine  14 mg Transdermal Daily  . sodium chloride flush  10-40 mL Intracatheter Q12H   Continuous Infusions: . cefTRIAXone (ROCEPHIN)  IV 2 g (01/29/20 0025)  . lactated ringers 200 mL/hr at 12/22/19 2004  . vancomycin 1,000 mg (01/29/20 0320)   PRN Meds:.cyclobenzaprine, ibuprofen, menthol-cetylpyridinium **OR** phenol, ondansetron **OR** ondansetron (ZOFRAN) IV,  oxyCODONE **OR** oxyCODONE, polyethylene glycol, senna-docusate, sodium chloride flush   DVT prophylaxis: SCD Code Status: full  Family Communication: none at bedside Disposition Plan:  . Patient came from: home            . Anticipated d/c  place:home . Barriers to d/c OR conditions which need to be met to effect a safe d/c: pending completion of IV abx   Consultants:   ID  Neurosurgery  Procedures:  3/23 C5-6, C6-7 anterior cervical discectomy with interbody fusion utilizing interbody allograft wedges and anterior plate instrumentation.  Antimicrobials:  Vancomycin\Ceftriaxone  Objective: Vitals:   01/28/20 1921 01/29/20 0332 01/29/20 0710 01/29/20 1259  BP: 116/81 109/69 101/68 107/78  Pulse: 82 92 80 73  Resp: _0 Temp: 98.5 F (36.9 C) 97.8 F (36.6 C) 98.1 F (36.7 C) 97.7 F (36.5 C)  TempSrc: Oral Oral Oral Oral  SpO2: 100% 100% 99% 100%  Weight:      Height:        Intake/Output Summary (Last 24 hours) at 01/29/2020 1356 Last data filed at 01/29/2020 0900 Gross per 24 hour  Intake 910 ml  Output --  Net 910 ml   Filed Weights   12/20/19 0135  Weight: 71.5 kg    Examination:  General: NAD Lungs: Clear, no wheezing Heart: Regular, no edema   Data Reviewed: I have personally reviewed following labs and imaging studies  CBC: Recent Labs  Lab 01/24/20 0409 01/25/20 0346 01/26/20 0451 01/27/20 0413 01/29/20 0412  WBC 4.0 3.9* 3.6* 3.5* 3.9*  NEUTROABS 1.6* 1.6* 1.3* 1.3* 1.5*  HGB 12.4* 12.6* 12.2* 12.3* 12.2*  HCT 37.6* 37.9* 37.3* 37.5* 36.6*  MCV 82.8 81.7 81.4 83.7 81.7  PLT 134* 137* 122* 126* 081*   Basic Metabolic Panel: Recent Labs  Lab 01/24/20 0409 01/25/20 0346 01/26/20 0451 01/27/20 0413 01/29/20 0412  NA 139 139 140 138 138  K 3.8 4.0 3.9 3.9 3.6  CL 105 104 104 104 100  CO2 _1 GLUCOSE 96 91 101* 117* 124*  BUN _2 CREATININE 0.97 0.96 0.95 1.00 1.04  CALCIUM 9.1 8.9 9.2 9.0 9.1  MG 1.7 1.8 1.9 1.9 1.8  PHOS 4.8* 4.8* 4.9* 4.7* 4.7*   GFR: Estimated Creatinine Clearance: 100.3 mL/min (by C-G formula based on SCr of 1.04 mg/dL). Liver Function Tests: Recent Labs  Lab 01/24/20 0409 01/25/20 0346 01/26/20 0451  01/27/20 0413 01/29/20 0412  AST 86* 110* 106* 102* 102*  ALT 198* 256* 275* 296* 321*  ALKPHOS 88 93 94 92 97  BILITOT 0.5 0.4 0.6 <0.1* 0.4  PROT 6.4* 6.4* 6.1* 6.0* 6.2*  ALBUMIN 3.5 3.6 3.5 3.5 3.4*   No results for input(s): LIPASE, AMYLASE in the last 168 hours. No results for input(s): AMMONIA in the last 168 hours. Coagulation Profile: No results for input(s): INR, PROTIME in the last 168 hours. Cardiac Enzymes: No results for input(s): CKTOTAL, CKMB, CKMBINDEX, TROPONINI in the last 168 hours. BNP (last 3 results) No results for input(s): PROBNP in the last 8760 hours. HbA1C: No results for input(s): HGBA1C in the last 72 hours. CBG: No results for input(s): GLUCAP in the last 168 hours. Lipid Profile: No results for input(s): CHOL, HDL, LDLCALC, TRIG, CHOLHDL, LDLDIRECT in the last 72 hours. Thyroid Function Tests: No results for input(s): TSH, T4TOTAL, FREET4, T3FREE, THYROIDAB in the last 72 hours. Anemia Panel: No results for input(s): VITAMINB12, FOLATE, FERRITIN, TIBC,  IRON, RETICCTPCT in the last 72 hours. Sepsis Labs: No results for input(s): PROCALCITON, LATICACIDVEN in the last 168 hours.  No results found for this or any previous visit (from the past 240 hour(s)).       Radiology Studies: No results found.      Scheduled Meds: . Chlorhexidine Gluconate Cloth  6 each Topical Daily  . gabapentin  100 mg Oral TID  . miconazole nitrate   Topical BID  . nicotine  14 mg Transdermal Daily  . sodium chloride flush  10-40 mL Intracatheter Q12H   Continuous Infusions: . cefTRIAXone (ROCEPHIN)  IV 2 g (01/29/20 0025)  . lactated ringers 200 mL/hr at 12/22/19 2004  . vancomycin 1,000 mg (01/29/20 0320)     LOS: 41 days    Yides Saidi M. Cruzita Lederer, MD, PhD [facility]  Between 7 am - 7 pm I am available, please contact me via Amion or Securechat Between 7 pm - 7 am I am not available, please contact night coverage MD/APP via Amion

## 2020-01-30 LAB — VANCOMYCIN, TROUGH: Vancomycin Tr: 10 ug/mL — ABNORMAL LOW (ref 15–20)

## 2020-01-30 NOTE — Plan of Care (Signed)
  Problem: Pain Managment: Goal: General experience of comfort will improve Outcome: Progressing   Problem: Safety: Goal: Ability to remain free from injury will improve Outcome: Progressing   Problem: Skin Integrity: Goal: Risk for impaired skin integrity will decrease Outcome: Progressing   

## 2020-01-30 NOTE — Progress Notes (Signed)
PROGRESS NOTE    Michael Lester  LZJ:[id number] DOB: Dec 09, 1985 DOA: 12/19/2019 PCP: Patient, [name]   Brief Narrative: Michael Lester an 34 y.o.WM PMHx IVDU, initially presents to [facility] after found unresponsive complaining of headache, neck pain right shoulder pain, he was found to be septic with tachycardia and leukocytosis CT of the C-spine show some lucency in C6-C7 MRI of the spine showed discitis and osteomyelitis of C6-C7 and paravertebral edema resulting in phlegmon with with spinal stenosis and cord mass-effect. He's s/p anterior cervical discectomy with interbody fusion by Dr. Trenton Gammon on 2/23.  Currently is completing prolonged course of vanc/ceftriaxone and rifampin.  Rifampin was discontinued due to worsening LFT's.  Will complete abx course on 4/6 and at that point can discharge on doxycycline BID with follow up with ID on April 28th.  Subjective: No distress, sitting in chair  Assessment & Plan:   Principal Problem:   Osteomyelitis of C6-7 Active Problems:   Injection of illicit drug within last 12 months   Syncope   Abscess in epidural space of cervical spine   Opioid dependence, daily use (HCC)   Chronic hepatitis C without hepatic coma (HCC)   Severe sepsis (HCC)   Hepatitis C antibody positive in blood  Sepsis due to discitis/osteomyelitis of C6-7: -Anterior cervical discectomy with interbody fusion by Dr. Trenton Gammon on 12/22/2019. -Blood cultures show coag negative staph 1 out of 2 bottles likely contaminant. -Repeated blood cultures on 12/20/2019 have remained negative till date. -wound cx on 2/23 negative -Patient will continue IV vancomycin Rocephin and rifampin a minimum of 6 weeks Started 01/18/2020.  (Stop date February 02, 2020) -rifampin has been discontinued with rising LFT's, continue to monitor, discussed with ID, Dr. Florene Glen discussed with Dr. Tommy Medal -3/23 ordered removal of foam dressing and Steri-Strips overnight.  Incision site healed well negative sign of  infection.  Patient order to cover with Tegaderm whenever he showers -schedule ibuprofen for pain, continue norco prn -ID recommending IV abx until April 6, then discharge on doxycycline BID (give 30 day supply of 100 mg BID at discharge - arrange for [facility] pharmacy to deliver to bed prior to discharge) - he's to follow up in ID clinic on April 28th  Hepatitis C -follow with ID outpatient  Recurrent syncope -Echo was unremarkable EKG no significant findings question due to drug use in the setting of sepsis.  Injection of illicit drug within last 12 months -He relates he went to detox about 4 weeks prior to admission -On admission no urine tox screen obtained  Normocytic anemia -Likely due to anemia of chronic disease.  Constipation -Resolved with MiraLAX.  Tinea cruris -Miconazole BID  Thrombocytopenia -Follow  Elevated LFTs -LFTs still little bit elevated but overall stable, not getting significantly worse, continue to monitor.  Now off rifampin -RUQ [place] - mild floating debris, otherwise normal gallbladder.   -repeat LFTs tomorrow  Scheduled Meds: . Chlorhexidine Gluconate Cloth  6 each Topical Daily  . gabapentin  100 mg Oral TID  . miconazole nitrate   Topical BID  . nicotine  14 mg Transdermal Daily  . sodium chloride flush  10-40 mL Intracatheter Q12H   Continuous Infusions: . cefTRIAXone (ROCEPHIN)  IV 2 g (01/30/20 0023)  . lactated ringers 200 mL/hr at 12/22/19 2004  . vancomycin 1,000 mg (01/30/20 0434)   PRN Meds:.cyclobenzaprine, ibuprofen, menthol-cetylpyridinium **OR** phenol, ondansetron **OR** ondansetron (ZOFRAN) IV, oxyCODONE **OR** oxyCODONE, polyethylene glycol, senna-docusate, sodium chloride flush   DVT prophylaxis: SCD  Code Status: full  Family Communication: none at bedside Disposition Plan:  . Patient came from: home            . Anticipated d/c place:home . Barriers to d/c OR conditions which need to be met to effect a safe d/c: pending  completion of IV abx   Consultants:   ID  Neurosurgery  Procedures:  3/23 C5-6, C6-7 anterior cervical discectomy with interbody fusion utilizing interbody allograft wedges and anterior plate instrumentation.  Antimicrobials:  Vancomycin\Ceftriaxone  Objective: Vitals:   01/29/20 1259 01/29/20 2005 01/30/20 0410 01/30/20 0837  BP: 107/78 109/74 101/68 105/71  Pulse: 73 87 79 77  Resp: _0 Temp: 97.7 F (36.5 C) 98.1 F (36.7 C) 97.8 F (36.6 C) 98.1 F (36.7 C)  TempSrc: Oral Oral Oral Oral  SpO2: 100% 100% 96% 97%  Weight:      Height:        Intake/Output Summary (Last 24 hours) at 01/30/2020 1016 Last data filed at 01/30/2020 0500 Gross per 24 hour  Intake 240 ml  Output 600 ml  Net -360 ml   Filed Weights   12/20/19 0135  Weight: 71.5 kg    Examination:  General: NAD   Data Reviewed: I have personally reviewed following labs and imaging studies  CBC: Recent Labs  Lab 01/24/20 0409 01/25/20 0346 01/26/20 0451 01/27/20 0413 01/29/20 0412  WBC 4.0 3.9* 3.6* 3.5* 3.9*  NEUTROABS 1.6* 1.6* 1.3* 1.3* 1.5*  HGB 12.4* 12.6* 12.2* 12.3* 12.2*  HCT 37.6* 37.9* 37.3* 37.5* 36.6*  MCV 82.8 81.7 81.4 83.7 81.7  PLT 134* 137* 122* 126* 102*   Basic Metabolic Panel: Recent Labs  Lab 01/24/20 0409 01/25/20 0346 01/26/20 0451 01/27/20 0413 01/29/20 0412  NA 139 139 140 138 138  K 3.8 4.0 3.9 3.9 3.6  CL 105 104 104 104 100  CO2 _1 GLUCOSE 96 91 101* 117* 124*  BUN _2 CREATININE 0.97 0.96 0.95 1.00 1.04  CALCIUM 9.1 8.9 9.2 9.0 9.1  MG 1.7 1.8 1.9 1.9 1.8  PHOS 4.8* 4.8* 4.9* 4.7* 4.7*   GFR: Estimated Creatinine Clearance: 100.3 mL/min (by C-G formula based on SCr of 1.04 mg/dL). Liver Function Tests: Recent Labs  Lab 01/24/20 0409 01/25/20 0346 01/26/20 0451 01/27/20 0413 01/29/20 0412  AST 86* 110* 106* 102* 102*  ALT 198* 256* 275* 296* 321*  ALKPHOS 88 93 94 92 97  BILITOT 0.5 0.4 0.6 <0.1* 0.4    PROT 6.4* 6.4* 6.1* 6.0* 6.2*  ALBUMIN 3.5 3.6 3.5 3.5 3.4*   No results for input(s): LIPASE, AMYLASE in the last 168 hours. No results for input(s): AMMONIA in the last 168 hours. Coagulation Profile: No results for input(s): INR, PROTIME in the last 168 hours. Cardiac Enzymes: No results for input(s): CKTOTAL, CKMB, CKMBINDEX, TROPONINI in the last 168 hours. BNP (last 3 results) No results for input(s): PROBNP in the last 8760 hours. HbA1C: No results for input(s): HGBA1C in the last 72 hours. CBG: No results for input(s): GLUCAP in the last 168 hours. Lipid Profile: No results for input(s): CHOL, HDL, LDLCALC, TRIG, CHOLHDL, LDLDIRECT in the last 72 hours. Thyroid Function Tests: No results for input(s): TSH, T4TOTAL, FREET4, T3FREE, THYROIDAB in the last 72 hours. Anemia Panel: No results for input(s): VITAMINB12, FOLATE, FERRITIN, TIBC, IRON, RETICCTPCT in the last 72 hours. Sepsis Labs: No results for input(s): PROCALCITON, LATICACIDVEN in the last 168 hours.  No results found for this or any previous visit (from the past 240 hour(s)).       Radiology Studies: No results found.      Scheduled Meds: . Chlorhexidine Gluconate Cloth  6 each Topical Daily  . gabapentin  100 mg Oral TID  . miconazole nitrate   Topical BID  . nicotine  14 mg Transdermal Daily  . sodium chloride flush  10-40 mL Intracatheter Q12H   Continuous Infusions: . cefTRIAXone (ROCEPHIN)  IV 2 g (01/30/20 0023)  . lactated ringers 200 mL/hr at 12/22/19 2004  . vancomycin 1,000 mg (01/30/20 0434)     LOS: 66 days    Lucca Ballo M. Cruzita Lederer, MD, PhD [facility]  Between 7 am - 7 pm I am available, please contact me via Amion or Securechat Between 7 pm - 7 am I am not available, please contact night coverage MD/APP via Amion

## 2020-01-30 NOTE — Progress Notes (Signed)
Pharmacy Antibiotic Note  Michael Lester is a 34 y.o. male admitted on 12/19/2019 with osteomyelitis of the cervical spine. Pharmacy has been consulted for vancomycin dosing.  SCr fluctuating this admission but stable around 0.8-1. Scr today is 1.04.  Afebrile. WBC normal.  Vancomycin AUC of 433 on 4/3 is therapeutic at our goal of 400-550.   Plan: Continue vancomycin 1000 mg IV q12h Tentative stop date for all antibiotics: 02/02/20 Monitor clinical progress, renal function, vanc levels as clinically indicated.   Height: 6\' 1"  (185.4 cm) Weight: 71.5 kg (157 lb 10.1 oz) IBW/kg (Calculated) : 79.9  Temp (24hrs), Avg:97.9 F (36.6 C), Min:97.7 F (36.5 C), Max:98.1 F (36.7 C)  Recent Labs  Lab 01/23/20 1537 01/24/20 0409 01/25/20 0346 01/26/20 0451 01/27/20 0413 01/29/20 0412 01/29/20 2052 01/30/20 0316  WBC  --  4.0 3.9* 3.6* 3.5* 3.9*  --   --   CREATININE  --  0.97 0.96 0.95 1.00 1.04  --   --   VANCOTROUGH 12*  --   --   --   --   --   --  10*  VANCOPEAK  --   --   --   --   --   --  19*  --     Estimated Creatinine Clearance: 100.3 mL/min (by C-G formula based on SCr of 1.04 mg/dL).    Allergies  Allergen Reactions  . Shellfish Allergy Anaphylaxis    Antimicrobials this admission: Vanc 2/20 >>4/6 CTX 2/21 >>4/6 Rifampin 2/27 >>3/28 Cefepime/Flagyl x1 2/20  Dose adjustments this admission: Vanc 1250 mg q12h (2/26) >> 1g q12h (3/4) >> 750mg  q12h (3/14) >> 1000 mg q12h (3/17) >>  Cultures: 2/20 BCx [facility] - 1/2 MRSE 2/21 MRSA PCR - negative 2/21 BCx - negative 2/23 cervical wound - negative  Thank you for involving pharmacy in this patient's care.  3/21, PharmD PGY1 Pharmacy Resident  Please check AMION for all [facility] Pharmacy phone numbers After 10:00 PM, call Main Pharmacy [phone]  01/30/2020 8:57 AM

## 2020-01-31 LAB — CBC
HCT: 37.5 % — ABNORMAL LOW (ref 39.0–52.0)
Hemoglobin: 12.1 g/dL — ABNORMAL LOW (ref 13.0–17.0)
MCH: 26.8 pg (ref 26.0–34.0)
MCHC: 32.3 g/dL (ref 30.0–36.0)
MCV: 83.1 fL (ref 80.0–100.0)
Platelets: 129 10*3/uL — ABNORMAL LOW (ref 150–400)
RBC: 4.51 MIL/uL (ref 4.22–5.81)
RDW: 15.7 % — ABNORMAL HIGH (ref 11.5–15.5)
WBC: 4 10*3/uL (ref 4.0–10.5)
nRBC: 0 % (ref 0.0–0.2)

## 2020-01-31 LAB — COMPREHENSIVE METABOLIC PANEL
ALT: 325 U/L — ABNORMAL HIGH (ref 0–44)
AST: 100 U/L — ABNORMAL HIGH (ref 15–41)
Albumin: 3.3 g/dL — ABNORMAL LOW (ref 3.5–5.0)
Alkaline Phosphatase: 98 U/L (ref 38–126)
Anion gap: 9 (ref 5–15)
BUN: 12 mg/dL (ref 6–20)
CO2: 26 mmol/L (ref 22–32)
Calcium: 8.8 mg/dL — ABNORMAL LOW (ref 8.9–10.3)
Chloride: 104 mmol/L (ref 98–111)
Creatinine, Ser: 0.97 mg/dL (ref 0.61–1.24)
GFR calc Af Amer: >60 mL/min (ref >60)
GFR calc non Af Amer: >60 mL/min (ref >60)
Glucose, Bld: 98 mg/dL (ref 70–99)
Potassium: 3.8 mmol/L (ref 3.5–5.1)
Sodium: 139 mmol/L (ref 135–145)
Total Bilirubin: 0.3 mg/dL (ref 0.3–1.2)
Total Protein: 5.8 g/dL — ABNORMAL LOW (ref 6.5–8.1)

## 2020-01-31 NOTE — Progress Notes (Signed)
PROGRESS NOTE    Michael Lester  IRS:[id number] DOB: 1985/12/30 DOA: 12/19/2019 PCP: Patient, [name]   Brief Narrative: Michael Lester an 34 y.o.WM PMHx IVDU, initially presents to [facility] after found unresponsive complaining of headache, neck pain right shoulder pain, he was found to be septic with tachycardia and leukocytosis CT of the C-spine show some lucency in C6-C7 MRI of the spine showed discitis and osteomyelitis of C6-C7 and paravertebral edema resulting in phlegmon with with spinal stenosis and cord mass-effect. He's s/p anterior cervical discectomy with interbody fusion by Dr. Trenton Gammon on 2/23.  Currently is completing prolonged course of vanc/ceftriaxone and rifampin.  Rifampin was discontinued due to worsening LFT's.  Will complete abx course on 4/6 and at that point can discharge on doxycycline BID with follow up with ID on April 28th.  Subjective: No complaints, doing well this morning  Assessment & Plan:   Principal Problem:   Osteomyelitis of C6-7 Active Problems:   Injection of illicit drug within last 12 months   Syncope   Abscess in epidural space of cervical spine   Opioid dependence, daily use (HCC)   Chronic hepatitis C without hepatic coma (HCC)   Severe sepsis (HCC)   Hepatitis C antibody positive in blood  Sepsis due to discitis/osteomyelitis of C6-7: -Anterior cervical discectomy with interbody fusion by Dr. Trenton Gammon on 12/22/2019. -Blood cultures show coag negative staph 1 out of 2 bottles likely contaminant. -Repeated blood cultures on 12/20/2019 have remained negative till date. -wound cx on 2/23 negative -Patient will continue IV vancomycin Rocephin and rifampin a minimum of 6 weeks Started 01/18/2020.  (Stop date February 02, 2020) -rifampin has been discontinued with rising LFT's, continue to monitor, discussed with ID, Dr. Florene Glen discussed with Dr. Tommy Medal -3/23 ordered removal of foam dressing and Steri-Strips overnight.  Incision site healed well negative  sign of infection.  Patient order to cover with Tegaderm whenever he showers -schedule ibuprofen for pain, continue norco prn -ID recommending IV abx until April 6, then discharge on doxycycline BID (give 30 day supply of 100 mg BID at discharge - arrange for [facility] pharmacy to deliver to bed prior to discharge) - he's to follow up in ID clinic on April 28th  Hepatitis C -follow with ID outpatient, likely responsible for his elevated LFTs  Recurrent syncope -Echo was unremarkable EKG no significant findings question due to drug use in the setting of sepsis.  Injection of illicit drug within last 12 months -He relates he went to detox about 4 weeks prior to admission -On admission no urine tox screen obtained  Normocytic anemia -Likely due to anemia of chronic disease.  Constipation -Resolved with MiraLAX.  Tinea cruris -Miconazole BID  Thrombocytopenia -Follow  Elevated LFTs -LFTs still little bit elevated but overall stable, not getting significantly worse, continue to monitor.  Now off rifampin -RUQ [place] - mild floating debris, otherwise normal gallbladder.   -Overall stable  Scheduled Meds: . Chlorhexidine Gluconate Cloth  6 each Topical Daily  . gabapentin  100 mg Oral TID  . miconazole nitrate   Topical BID  . nicotine  14 mg Transdermal Daily  . sodium chloride flush  10-40 mL Intracatheter Q12H   Continuous Infusions: . cefTRIAXone (ROCEPHIN)  IV 2 g (01/31/20 0056)  . lactated ringers 200 mL/hr at 12/22/19 2004  . vancomycin 1,000 mg (01/31/20 0452)   PRN Meds:.cyclobenzaprine, ibuprofen, menthol-cetylpyridinium **OR** phenol, ondansetron **OR** ondansetron (ZOFRAN) IV, oxyCODONE **OR** oxyCODONE, polyethylene glycol, senna-docusate, sodium chloride  flush   DVT prophylaxis: SCD Code Status: full  Family Communication: none at bedside Disposition Plan:  . Patient came from: home            . Anticipated d/c place:home . Barriers to d/c OR conditions which  need to be met to effect a safe d/c: pending completion of IV abx   Consultants:   ID  Neurosurgery  Procedures:  3/23 C5-6, C6-7 anterior cervical discectomy with interbody fusion utilizing interbody allograft wedges and anterior plate instrumentation.  Antimicrobials:  Vancomycin\Ceftriaxone  Objective: Vitals:   01/30/20 1411 01/30/20 2030 01/31/20 0452 01/31/20 0816  BP: 107/88 108/63 108/73 122/74  Pulse: 86 84 (!) 59 81  Resp: 17 14 15 19   Temp: 98.4 F (36.9 C) 98.3 F (36.8 C) 97.6 F (36.4 C) 97.9 F (36.6 C)  TempSrc: Oral Oral Oral Oral  SpO2: 98% 100% 99% 91%  Weight:      Height:       No intake or output data in the 24 hours ending 01/31/20 1019 Filed Weights   12/20/19 0135  Weight: 71.5 kg    Examination:  General: No distress   Data Reviewed: I have personally reviewed following labs and imaging studies  CBC: Recent Labs  Lab 01/25/20 0346 01/26/20 0451 01/27/20 0413 01/29/20 0412 01/31/20 0342  WBC 3.9* 3.6* 3.5* 3.9* 4.0  NEUTROABS 1.6* 1.3* 1.3* 1.5*  --   HGB 12.6* 12.2* 12.3* 12.2* 12.1*  HCT 37.9* 37.3* 37.5* 36.6* 37.5*  MCV 81.7 81.4 83.7 81.7 83.1  PLT 137* 122* 126* 131* 088*   Basic Metabolic Panel: Recent Labs  Lab 01/25/20 0346 01/26/20 0451 01/27/20 0413 01/29/20 0412 01/31/20 0342  NA 139 140 138 138 139  K 4.0 3.9 3.9 3.6 3.8  CL 104 104 104 100 104  CO2 25 27 25 26 26   GLUCOSE 91 101* 117* 124* 98  BUN 20 17 17 17 12   CREATININE 0.96 0.95 1.00 1.04 0.97  CALCIUM 8.9 9.2 9.0 9.1 8.8*  MG 1.8 1.9 1.9 1.8  --   PHOS 4.8* 4.9* 4.7* 4.7*  --    GFR: Estimated Creatinine Clearance: 107.5 mL/min (by C-G formula based on SCr of 0.97 mg/dL). Liver Function Tests: Recent Labs  Lab 01/25/20 0346 01/26/20 0451 01/27/20 0413 01/29/20 0412 01/31/20 0342  AST 110* 106* 102* 102* 100*  ALT 256* 275* 296* 321* 325*  ALKPHOS 93 94 92 97 98  BILITOT 0.4 0.6 <0.1* 0.4 0.3  PROT 6.4* 6.1* 6.0* 6.2* 5.8*  ALBUMIN  3.6 3.5 3.5 3.4* 3.3*   No results for input(s): LIPASE, AMYLASE in the last 168 hours. No results for input(s): AMMONIA in the last 168 hours. Coagulation Profile: No results for input(s): INR, PROTIME in the last 168 hours. Cardiac Enzymes: No results for input(s): CKTOTAL, CKMB, CKMBINDEX, TROPONINI in the last 168 hours. BNP (last 3 results) No results for input(s): PROBNP in the last 8760 hours. HbA1C: No results for input(s): HGBA1C in the last 72 hours. CBG: No results for input(s): GLUCAP in the last 168 hours. Lipid Profile: No results for input(s): CHOL, HDL, LDLCALC, TRIG, CHOLHDL, LDLDIRECT in the last 72 hours. Thyroid Function Tests: No results for input(s): TSH, T4TOTAL, FREET4, T3FREE, THYROIDAB in the last 72 hours. Anemia Panel: No results for input(s): VITAMINB12, FOLATE, FERRITIN, TIBC, IRON, RETICCTPCT in the last 72 hours. Sepsis Labs: No results for input(s): PROCALCITON, LATICACIDVEN in the last 168 hours.  No results found for this or any  previous visit (from the past 240 hour(s)).       Radiology Studies: No results found.      Scheduled Meds: . Chlorhexidine Gluconate Cloth  6 each Topical Daily  . gabapentin  100 mg Oral TID  . miconazole nitrate   Topical BID  . nicotine  14 mg Transdermal Daily  . sodium chloride flush  10-40 mL Intracatheter Q12H   Continuous Infusions: . cefTRIAXone (ROCEPHIN)  IV 2 g (01/31/20 0056)  . lactated ringers 200 mL/hr at 12/22/19 2004  . vancomycin 1,000 mg (01/31/20 0452)     LOS: 3 days    Ziyah Cordoba M. Cruzita Lederer, MD, PhD [facility]  Between 7 am - 7 pm I am available, please contact me via Amion or Securechat Between 7 pm - 7 am I am not available, please contact night coverage MD/APP via Amion

## 2020-02-01 LAB — MAGNESIUM: Magnesium: 1.8 mg/dL (ref 1.7–2.4)

## 2020-02-01 LAB — PHOSPHORUS: Phosphorus: 4.3 mg/dL (ref 2.5–4.6)

## 2020-02-01 MED ORDER — SODIUM CHLORIDE 0.9 % IV SOLN
INTRAVENOUS | Status: DC | PRN
  Administered 2020-02-01: 09:00:00 250 mL via INTRAVENOUS

## 2020-02-01 MED ORDER — DOXYCYCLINE HYCLATE 100 MG PO CAPS: 100.0000 mg | ORAL_CAPSULE | Freq: Two times a day (BID) | ORAL | 0 refills | Status: DC

## 2020-02-01 MED FILL — DOXYCYCLINE HYCLATE 100 MG: 100 | 30 days supply | Qty: 60 | Fill #0

## 2020-02-01 NOTE — Progress Notes (Signed)
PROGRESS NOTE    Michael Lester  BSJ:[id number] DOB: Sep 06, 1986 DOA: 12/19/2019 PCP: Patient, [name]   Brief Narrative: [name] an 34 y.o.WM PMHx IVDU, initially presents to [facility] after found unresponsive complaining of headache, neck pain right shoulder pain, he was found to be septic with tachycardia and leukocytosis CT of the C-spine show some lucency in C6-C7 MRI of the spine showed discitis and osteomyelitis of C6-C7 and paravertebral edema resulting in phlegmon with with spinal stenosis and cord mass-effect. He's s/p anterior cervical discectomy with interbody fusion by Dr. Trenton Gammon on 2/23.  Currently is completing prolonged course of vanc/ceftriaxone and rifampin.  Rifampin was discontinued due to worsening LFT's.  Will complete abx course on 4/6 and at that point can discharge on doxycycline BID with follow up with ID on April 28th.  Subjective: N no complaints complaints, doing well this morning  Assessment & Plan:   Principal Problem:   Osteomyelitis of C6-7 Active Problems:   Injection of illicit drug within last 12 months   Syncope   Abscess in epidural space of cervical spine   Opioid dependence, daily use (HCC)   Chronic hepatitis C without hepatic coma (HCC)   Severe sepsis (HCC)   Hepatitis C antibody positive in blood  Sepsis due to discitis/osteomyelitis of C6-7: -Anterior cervical discectomy with interbody fusion by Dr. Trenton Gammon on 12/22/2019. -Blood cultures show coag negative staph 1 out of 2 bottles likely contaminant. -Repeated blood cultures on 12/20/2019 have remained negative till date. -wound cx on 2/23 negative -Patient will continue IV vancomycin Rocephin and rifampin a minimum of 6 weeks Started 01/18/2020.  (Stop date February 02, 2020) -rifampin has been discontinued with rising LFT's, continue to monitor, discussed with ID, Dr. Florene Glen discussed with Dr. Tommy Medal -3/23 ordered removal of foam dressing and Steri-Strips overnight.  Incision site healed  well negative sign of infection.  Patient order to cover with Tegaderm whenever he showers -schedule ibuprofen for pain, continue norco prn -ID recommending IV abx until April 6, then discharge on doxycycline BID (give 30 day supply of 100 mg BID at discharge - arrange for [facility] pharmacy to deliver to bed prior to discharge) - he's to follow up in ID clinic on April 28th -Home tomorrow after 4 PM vancomycin dose  Hepatitis C -follow with ID outpatient, likely responsible for his elevated LFTs  Recurrent syncope -Echo was unremarkable EKG no significant findings question due to drug use in the setting of sepsis.  Injection of illicit drug within last 12 months -He relates he went to detox about 4 weeks prior to admission -On admission no urine tox screen obtained  Normocytic anemia -Likely due to anemia of chronic disease.  Constipation -Resolved with MiraLAX.  Tinea cruris -Miconazole BID  Thrombocytopenia -Follow  Elevated LFTs -LFTs still little bit elevated but overall stable, not getting significantly worse, continue to monitor.  Now off rifampin -RUQ [place] - mild floating debris, otherwise normal gallbladder.   -Overall stable  Scheduled Meds: . Chlorhexidine Gluconate Cloth  6 each Topical Daily  . gabapentin  100 mg Oral TID  . miconazole nitrate   Topical BID  . nicotine  14 mg Transdermal Daily  . sodium chloride flush  10-40 mL Intracatheter Q12H   Continuous Infusions: . sodium chloride 10 mL/hr at 02/01/20 0900  . cefTRIAXone (ROCEPHIN)  IV 2 g (02/01/20 0113)  . lactated ringers 200 mL/hr at 12/22/19 2004  . vancomycin Stopped (02/01/20 0604)   PRN Meds:.sodium  chloride, cyclobenzaprine, ibuprofen, menthol-cetylpyridinium **OR** phenol, ondansetron **OR** ondansetron (ZOFRAN) IV, oxyCODONE **OR** oxyCODONE, polyethylene glycol, senna-docusate, sodium chloride flush   DVT prophylaxis: SCD Code Status: full  Family Communication: none at  bedside Disposition Plan:  . Patient came from: home            . Anticipated d/c place:home . Barriers to d/c OR conditions which need to be met to effect a safe d/c: pending completion of IV abx   Consultants:   ID  Neurosurgery  Procedures:  3/23 C5-6, C6-7 anterior cervical discectomy with interbody fusion utilizing interbody allograft wedges and anterior plate instrumentation.  Antimicrobials:  Vancomycin\Ceftriaxone  Objective: Vitals:   01/31/20 1300 01/31/20 2023 02/01/20 0352 02/01/20 0721  BP: 122/78 114/70 106/70 105/69  Pulse: 80 86 66 76  Resp: 20 15 14 18   Temp: 98 F (36.7 C) 98.3 F (36.8 C) 97.8 F (36.6 C) 98 F (36.7 C)  TempSrc: Oral Oral Oral Oral  SpO2: 97% 100% 98% 98%  Weight:      Height:        Intake/Output Summary (Last 24 hours) at 02/01/2020 1003 Last data filed at 02/01/2020 0913 Gross per 24 hour  Intake 3042.61 ml  Output --  Net 3042.61 ml   Filed Weights   12/20/19 0135  Weight: 71.5 kg    Examination:  General: NAD   Data Reviewed: I have personally reviewed following labs and imaging studies  CBC: Recent Labs  Lab 01/26/20 0451 01/27/20 0413 01/29/20 0412 01/31/20 0342  WBC 3.6* 3.5* 3.9* 4.0  NEUTROABS 1.3* 1.3* 1.5*  --   HGB 12.2* 12.3* 12.2* 12.1*  HCT 37.3* 37.5* 36.6* 37.5*  MCV 81.4 83.7 81.7 83.1  PLT 122* 126* 131* 790*   Basic Metabolic Panel: Recent Labs  Lab 01/26/20 0451 01/27/20 0413 01/29/20 0412 01/31/20 0342 02/01/20 0413  NA 140 138 138 139  --   K 3.9 3.9 3.6 3.8  --   CL 104 104 100 104  --   CO2 27 25 26 26   --   GLUCOSE 101* 117* 124* 98  --   BUN 17 17 17 12   --   CREATININE 0.95 1.00 1.04 0.97  --   CALCIUM 9.2 9.0 9.1 8.8*  --   MG 1.9 1.9 1.8  --  1.8  PHOS 4.9* 4.7* 4.7*  --  4.3   GFR: Estimated Creatinine Clearance: 107.5 mL/min (by C-G formula based on SCr of 0.97 mg/dL). Liver Function Tests: Recent Labs  Lab 01/26/20 0451 01/27/20 0413 01/29/20 0412  01/31/20 0342  AST 106* 102* 102* 100*  ALT 275* 296* 321* 325*  ALKPHOS 94 92 97 98  BILITOT 0.6 <0.1* 0.4 0.3  PROT 6.1* 6.0* 6.2* 5.8*  ALBUMIN 3.5 3.5 3.4* 3.3*   No results for input(s): LIPASE, AMYLASE in the last 168 hours. No results for input(s): AMMONIA in the last 168 hours. Coagulation Profile: No results for input(s): INR, PROTIME in the last 168 hours. Cardiac Enzymes: No results for input(s): CKTOTAL, CKMB, CKMBINDEX, TROPONINI in the last 168 hours. BNP (last 3 results) No results for input(s): PROBNP in the last 8760 hours. HbA1C: No results for input(s): HGBA1C in the last 72 hours. CBG: No results for input(s): GLUCAP in the last 168 hours. Lipid Profile: No results for input(s): CHOL, HDL, LDLCALC, TRIG, CHOLHDL, LDLDIRECT in the last 72 hours. Thyroid Function Tests: No results for input(s): TSH, T4TOTAL, FREET4, T3FREE, THYROIDAB in the last 72 hours. Anemia  Panel: No results for input(s): VITAMINB12, FOLATE, FERRITIN, TIBC, IRON, RETICCTPCT in the last 72 hours. Sepsis Labs: No results for input(s): PROCALCITON, LATICACIDVEN in the last 168 hours.  No results found for this or any previous visit (from the past 240 hour(s)).       Radiology Studies: No results found.      Scheduled Meds: . Chlorhexidine Gluconate Cloth  6 each Topical Daily  . gabapentin  100 mg Oral TID  . miconazole nitrate   Topical BID  . nicotine  14 mg Transdermal Daily  . sodium chloride flush  10-40 mL Intracatheter Q12H   Continuous Infusions: . sodium chloride 10 mL/hr at 02/01/20 0900  . cefTRIAXone (ROCEPHIN)  IV 2 g (02/01/20 0113)  . lactated ringers 200 mL/hr at 12/22/19 2004  . vancomycin Stopped (02/01/20 0604)     LOS: 87 days    Madeeha Costantino M. Cruzita Lederer, MD, PhD [facility]  Between 7 am - 7 pm I am available, please contact me via Amion or Securechat Between 7 pm - 7 am I am not available, please contact night coverage MD/APP via Amion

## 2020-02-01 NOTE — Plan of Care (Signed)
  Problem: Education: Goal: Knowledge of General Education information will improve Description: Including pain rating scale, medication(s)/side effects and non-pharmacologic comfort measures Outcome: Progressing   Problem: Clinical Measurements: Goal: Respiratory complications will improve Outcome: Progressing Note: On room air   Problem: Nutrition: Goal: Adequate nutrition will be maintained Outcome: Progressing   Problem: Coping: Goal: Level of anxiety will decrease Outcome: Progressing   Problem: Elimination: Goal: Will not experience complications related to urinary retention Outcome: Progressing   Problem: Pain Managment: Goal: General experience of comfort will improve Outcome: Progressing Note: Complaints of neck pain once treated with oxycodone which gave relief   Problem: Safety: Goal: Ability to remain free from injury will improve Outcome: Progressing

## 2020-02-02 LAB — COMPREHENSIVE METABOLIC PANEL
ALT: 312 U/L — ABNORMAL HIGH (ref 0–44)
AST: 92 U/L — ABNORMAL HIGH (ref 15–41)
Albumin: 3.5 g/dL (ref 3.5–5.0)
Alkaline Phosphatase: 100 U/L (ref 38–126)
Anion gap: 9 (ref 5–15)
BUN: 12 mg/dL (ref 6–20)
CO2: 26 mmol/L (ref 22–32)
Calcium: 9.1 mg/dL (ref 8.9–10.3)
Chloride: 104 mmol/L (ref 98–111)
Creatinine, Ser: 0.97 mg/dL (ref 0.61–1.24)
GFR calc Af Amer: >60 mL/min (ref >60)
GFR calc non Af Amer: >60 mL/min (ref >60)
Glucose, Bld: 95 mg/dL (ref 70–99)
Potassium: 3.7 mmol/L (ref 3.5–5.1)
Sodium: 139 mmol/L (ref 135–145)
Total Bilirubin: 0.4 mg/dL (ref 0.3–1.2)
Total Protein: 6.1 g/dL — ABNORMAL LOW (ref 6.5–8.1)

## 2020-02-02 MED ORDER — DOXYCYCLINE HYCLATE 100 MG PO CAPS: 100.0000 mg | ORAL_CAPSULE | Freq: Two times a day (BID) | ORAL | 0 refills | Status: AC

## 2020-02-02 MED ORDER — GABAPENTIN 100 MG PO CAPS: 100.0000 mg | ORAL_CAPSULE | Freq: Three times a day (TID) | ORAL | 0 refills | Status: AC

## 2020-02-02 MED ORDER — NICOTINE 14 MG/24HR TD PT24: 14.0000 mg | MEDICATED_PATCH | Freq: Every day | TRANSDERMAL | 0 refills | Status: AC

## 2020-02-02 MED ORDER — OXYCODONE HCL 5 MG PO TABS: 5.0000 mg | ORAL_TABLET | ORAL | 0 refills | Status: AC | PRN

## 2020-02-02 MED FILL — OXYCODONE HCL 5 MG TABS: 5 | 3 days supply | Qty: 18 | Fill #0

## 2020-02-02 MED FILL — GABAPENTIN 100 MG CAPSULE: 100 | 30 days supply | Qty: 90 | Fill #0

## 2020-02-02 NOTE — Discharge Summary (Signed)
Physician Discharge Summary  Michael Lester FGH:[id number] DOB: Oct 01, 1986 DOA: 12/19/2019  PCP: Patient, [name]  Admit date: 12/19/2019 Discharge date: 02/02/2020  Admitted From: home Disposition:  home  Recommendations for Outpatient Follow-up:  1. Follow up with PCP in 1-2 weeks 2. Follow-up with infectious disease on 4/28 as scheduled  Home Health: None Equipment/Devices: None  Discharge Condition: Stable CODE STATUS: Full code Diet recommendation: Regular diet  HPI: Per admitting MD, Michael Lester is a 34 y.o. male with medical history significant for IV heroin abuse, now presenting to the emergency department after he was found unresponsive and complaining of headache and neck pain.  Patient reports 1 to 2 weeks of pain near the base of his neck that radiates to the right shoulder and has been associated with numbness involving the right second and third fingers.  He has also had recurrent syncopal episodes over the same interval.  He has not noted any fevers or chills and denies cough or shortness of breath.  He does not become lightheaded, experienced chest pain or palpitations, or have any nausea or diaphoresis associated with the syncopal episodes.  He acknowledges ongoing daily IV heroin use but states that he would like to quit.  He has not experienced any chest pain and denies any other joint aches or rash. [facility] ED Course: Upon arrival to the ED, patient is found to be afebrile, saturating well on room air, tachycardic as high as 160s, and with stable blood pressure.  EKG features sinus tachycardia with rate 144, RSR' in V1 and suspected LVH with anterior ST elevation.  Chest x-ray is negative for acute cardiopulmonary disease.  Chemistry panel features a slight renal insufficiency and glucose of 190.  CBC notable for leukocytosis to 14,400 and mild normocytic anemia.  Lactic acid is reassuringly normal.  CT head is negative for acute intracranial abnormality and CT cervical  spine is negative for acute fracture but features an abnormal lucency at the adjacent endplates of C6-7.  This was followed by MRI cervical spine which is concerning for discitis osteomyelitis at C6-7 with prevertebral edema and phlegmon resulting in spinal stenosis and cord mass-effect without definite cord signal abnormality and without drainable fluid collection.  Blood cultures were collected in the emergency department, 2 L of lactated Ringer's were administered, and the patient was treated with cefepime, vancomycin, and Flagyl.  Neurosurgery at [facility] was consulted by the ED physician, did not anticipate any urgent surgical intervention but recommended admission to the hospitalist service at [facility] for ongoing treatment. COVID-19 PCR was negative in the ED.   Hospital Course / Discharge diagnoses: Sepsis due to discitis/osteomyelitis of C6-7 -patient was admitted to the hospital, infectious disease and neurosurgery were consulted.  He is now status post anterior cervical discectomy with interbody fusion by Dr. Dutch Quint on 12/22/2019. Blood cultures show coag negative staph 1 out of 2 bottles likely contaminant. Repeated blood cultures on 12/20/2019 have remained negative till date. Wound cx on 2/23 negative.  Given history of substance abuse he was not a candidate for PICC line and was maintained in the hospital for 6 weeks on IV vancomycin and ceftriaxone.  He was also maintained on rifampin until 3/28 but given LFT elevation this was discontinued after Dr. Lowell Guitar discussed with ID Dr. Algis Liming.  He has completed all antibiotics on 02/02/2020, has remained stable, afebrile, he will be transitioned to doxycycline twice daily with a 30-day supply, has follow-up with infectious disease in  3 weeks on April 28, patient was advised not to miss his appointment.  He expressed understanding.  Hepatitis C -follow with ID outpatient, likely responsible for his elevated LFTs.  Recurrent syncope  -Echo was unremarkable EKG no significant findings question due to drug use in the setting of sepsis. Injection of illicit drug within last 12 months -He relates he went to detox about 4 weeks prior to admission.  Normocytic anemia -Likely due to anemia of chronic disease. Constipation -Resolved with MiraLAX. Tinea cruris -Miconazole BID, resolved Thrombocytopenia -Follow, overall stable, will need to be followed as an outpatient Elevated LFTs -as above, rifampin has been discontinued, he also has hep C, this is likely multifactorial, LFT trend has been reassuringly stable and improving at the time of discharge.  Recommend follow-up labs as an outpatient. RUQ [place] - mild floating debris, otherwise normal gallbladder.    Discharge Instructions   Allergies as of 02/02/2020      Reactions   Shellfish Allergy Anaphylaxis      Medication List    STOP taking these medications   azithromycin 250 MG tablet Commonly known as: Zithromax Z-Pak     TAKE these medications   doxycycline 100 MG capsule Commonly known as: VIBRAMYCIN Take 1 capsule (100 mg total) by mouth 2 (two) times daily.   gabapentin 100 MG capsule Commonly known as: NEURONTIN Take 1 capsule (100 mg total) by mouth 3 (three) times daily.   ibuprofen 200 MG tablet Commonly known as: ADVIL Take 400 mg by mouth every 6 (six) hours as needed for headache (pain).   nicotine 14 mg/24hr patch Commonly known as: NICODERM CQ - dosed in mg/24 hours Place 1 patch (14 mg total) onto the skin daily.   oxyCODONE 5 MG immediate release tablet Commonly known as: Oxy IR/ROXICODONE Take 1 tablet (5 mg total) by mouth every 4 (four) hours as needed for up to 3 days for moderate pain.      Follow-up Information    [facility]. Schedule an appointment as soon as possible for a visit.   Contact information: [place] [id number] [phone]          Consultations:   Infectious disease  Neurosurgery  Procedures/Studies: 3/23 C5-6, C6-7 anterior cervical discectomy with interbody fusion utilizing interbody allograft wedges and anterior plate instrumentation.  DG Orthopantogram  Result Date: 01/05/2020 CLINICAL DATA:  Jaw pain. EXAM: ORTHOPANTOGRAM/PANORAMIC COMPARISON:  None. FINDINGS: No fracture or dislocation is noted. No lytic lesion is seen. Significant erosion of right posterior mandibular molar is noted consistent with dental disease. IMPRESSION: No significant abnormality seen involving the mandible. Erosion of right posterior mandibular molar is noted consistent with dental disease. Electronically Signed   By: Marijo Conception M.D.   On: 01/05/2020 10:34   US Abdomen Limited RUQ  Result Date: 01/24/2020 CLINICAL DATA:  Abnormal liver function tests. EXAM: ULTRASOUND ABDOMEN LIMITED RIGHT UPPER QUADRANT COMPARISON:  None. FINDINGS: Gallbladder: Mild scattered debris floating in the gallbladder with no shadowing gallstones. No gallbladder wall thickening. No pericholecystic fluid. No sonographic Murphy sign. Common bile duct: Diameter: 2 mm Liver: No focal lesion identified. Within normal limits in parenchymal echogenicity. Portal vein is patent on color Doppler imaging with normal direction of blood flow towards the liver. Other: None. IMPRESSION: Mild floating debris in the otherwise normal gallbladder. No cholelithiasis. No biliary ductal dilatation. Normal liver. Electronically Signed   By: Ilona Sorrel M.D.   On: 01/24/2020 18:01  Subjective: - no chest pain, shortness of breath, no abdominal pain, nausea or vomiting.   Discharge Exam: BP 111/74 (BP Location: Right Arm)   Pulse 76   Temp 97.8 F (36.6 C) (Oral)   Resp 16   Ht 6\' 1"  (1.854 m)   Wt 71.5 kg   SpO2 98%   BMI 20.80 kg/m   General: Pt is alert, awake, not in acute distress Cardiovascular: RRR, S1/S2 +, no rubs, no gallops Respiratory: CTA bilaterally, no wheezing, no  rhonchi Abdominal: Soft, NT, ND, bowel sounds +   The results of significant diagnostics from this hospitalization (including imaging, microbiology, ancillary and laboratory) are listed below for reference.     Microbiology: No results found for this or any previous visit (from the past 240 hour(s)).   Labs: Basic Metabolic Panel: Recent Labs  Lab 01/27/20 0413 01/29/20 0412 01/31/20 0342 02/01/20 0413 02/02/20 0644  NA 138 138 139  --  139  K 3.9 3.6 3.8  --  3.7  CL 104 100 104  --  104  CO2 25 26 26   --  26  GLUCOSE 117* 124* 98  --  95  BUN 17 17 12   --  12  CREATININE 1.00 1.04 0.97  --  0.97  CALCIUM 9.0 9.1 8.8*  --  9.1  MG 1.9 1.8  --  1.8  --   PHOS 4.7* 4.7*  --  4.3  --    Liver Function Tests: Recent Labs  Lab 01/27/20 0413 01/29/20 0412 01/31/20 0342 02/02/20 0644  AST 102* 102* 100* 92*  ALT 296* 321* 325* 312*  ALKPHOS 92 97 98 100  BILITOT <0.1* 0.4 0.3 0.4  PROT 6.0* 6.2* 5.8* 6.1*  ALBUMIN 3.5 3.4* 3.3* 3.5   CBC: Recent Labs  Lab 01/27/20 0413 01/29/20 0412 01/31/20 0342  WBC 3.5* 3.9* 4.0  NEUTROABS 1.3* 1.5*  --   HGB 12.3* 12.2* 12.1*  HCT 37.5* 36.6* 37.5*  MCV 83.7 81.7 83.1  PLT 126* 131* 129*   CBG: No results for input(s): GLUCAP in the last 168 hours. Hgb A1c No results for input(s): HGBA1C in the last 72 hours. Lipid Profile No results for input(s): CHOL, HDL, LDLCALC, TRIG, CHOLHDL, LDLDIRECT in the last 72 hours. Thyroid function studies No results for input(s): TSH, T4TOTAL, T3FREE, THYROIDAB in the last 72 hours.  Invalid input(s): FREET3 Urinalysis No results found for: COLORURINE, APPEARANCEUR, LABSPEC, PHURINE, GLUCOSEU, HGBUR, BILIRUBINUR, KETONESUR, PROTEINUR, UROBILINOGEN, NITRITE, LEUKOCYTESUR  FURTHER DISCHARGE INSTRUCTIONS:   Get Medicines reviewed and adjusted: Please take all your medications with you for your next visit with your Primary MD   Laboratory/radiological data: Please request your  Primary MD to go over all hospital tests and procedure/radiological results at the follow up, please ask your Primary MD to get all Hospital records sent to his/her office.   In some cases, they will be blood work, cultures and biopsy results pending at the time of your discharge. Please request that your primary care M.D. goes through all the records of your hospital data and follows up on these results.   Also Note the following: If you experience worsening of your admission symptoms, develop shortness of breath, life threatening emergency, suicidal or homicidal thoughts you must seek medical attention immediately by calling 911 or calling your MD immediately  if symptoms less severe.   You must read complete instructions/literature along with all the possible adverse reactions/side effects for all the Medicines you take and that have  been prescribed to you. Take any new Medicines after you have completely understood and accpet all the possible adverse reactions/side effects.    Do not drive when taking Pain medications or sleeping medications (Benzodaizepines)   Do not take more than prescribed Pain, Sleep and Anxiety Medications. It is not advisable to combine anxiety,sleep and pain medications without talking with your primary care practitioner   Special Instructions: If you have smoked or chewed Tobacco  in the last 2 yrs please stop smoking, stop any regular Alcohol  and or any Recreational drug use.   Wear Seat belts while driving.   Please note: You were cared for by a hospitalist during your hospital stay. Once you are discharged, your primary care physician will handle any further medical issues. Please note that NO REFILLS for any discharge medications will be authorized once you are discharged, as it is imperative that you return to your primary care physician (or establish a relationship with a primary care physician if you do not have one) for your post hospital discharge needs so  that they can reassess your need for medications and monitor your lab values.  Time coordinating discharge: 35 minutes  SIGNED:  Pamella Pert, MD, PhD 02/02/2020, 9:08 AM

## 2020-02-02 NOTE — Progress Notes (Signed)
Michael Lester to be Discharged home per MD order.  Discussed prescriptions and follow up appointments with the patient. Prescriptions given to patient, medication list explained in detail. Patient verbalized understanding.  Allergies as of 02/02/2020      Reactions   Shellfish Allergy Anaphylaxis      Medication List    STOP taking these medications   azithromycin 250 MG tablet Commonly known as: Zithromax Z-Pak     TAKE these medications   doxycycline 100 MG capsule Commonly known as: VIBRAMYCIN Take 1 capsule (100 mg total) by mouth 2 (two) times daily.   gabapentin 100 MG capsule Commonly known as: NEURONTIN Take 1 capsule (100 mg total) by mouth 3 (three) times daily.   ibuprofen 200 MG tablet Commonly known as: ADVIL Take 400 mg by mouth every 6 (six) hours as needed for headache (pain).   nicotine 14 mg/24hr patch Commonly known as: NICODERM CQ - dosed in mg/24 hours Place 1 patch (14 mg total) onto the skin daily.   oxyCODONE 5 MG immediate release tablet Commonly known as: Oxy IR/ROXICODONE Take 1 tablet (5 mg total) by mouth every 4 (four) hours as needed for up to 3 days for moderate pain.       Vitals:   02/02/20 0715 02/02/20 1344  BP: 111/74 119/61  Pulse: 76 60  Resp: 16 16  Temp: 97.8 F (36.6 C) 97.8 F (36.6 C)  SpO2: 98% 96%    Skin clean, dry and intact without evidence of skin break down, no evidence of skin tears noted. PICC line discontinued. Site without signs and symptoms of complications. Dressing and pressure applied. Patient denies pain at this time. No complaints noted.  An After Visit Summary was printed and given to the patient. Patient escorted via WC, and D/C home via private auto.  Michael Lester [facility] 02/02/2020 6:04 PM

## 2020-02-02 NOTE — TOC Transition Note (Addendum)
Transition of Care [facility]) - CM/SW Discharge Note   Patient Details  Name: Michael Lester MRN: [id number] Date of Birth: Jul 26, 1986  [facility] of [facility] CM/SW Contact:  Curlene Labrum, RN Phone Number: 02/02/2020, 9:20 AM   Clinical Narrative:    Case management met with the patient to discuss the cost of patient's discharge medications for doxycycline po - includes that total cost of 12.00 to the patient.  The patient states that a friend is picking him up for transportation home and would bring the money in cash for the prescription.  All other discharge medications to be called into the patient's outside [facility] in [place], [place].     Barriers to Discharge: Continued Medical Work up, Inadequate or no insurance   Patient Goals and CMS Choice Patient states their goals for this hospitalization and ongoing recovery are:: "To get better."      Discharge Placement                       Discharge Plan and Services                                     Social Determinants of Health (SDOH) Interventions     Readmission Risk Interventions No flowsheet data found.

## 2020-02-24 ENCOUNTER — Inpatient Hospital Stay: Payer: Self-pay | Admitting: Internal Medicine

## 2020-05-01 ENCOUNTER — Encounter (HOSPITAL_COMMUNITY): Payer: Self-pay

## 2020-05-01 ENCOUNTER — Emergency Department (HOSPITAL_COMMUNITY): Payer: Self-pay

## 2020-05-01 ENCOUNTER — Inpatient Hospital Stay (HOSPITAL_COMMUNITY)
Admission: EM | Admit: 2020-05-01 | Discharge: 2020-05-04 | DRG: 917 | Disposition: A | Discharge status: Home / Self Care | Payer: Self-pay | Attending: Internal Medicine | Admitting: Internal Medicine

## 2020-05-01 ENCOUNTER — Other Ambulatory Visit: Payer: Self-pay

## 2020-05-01 DIAGNOSIS — F17200 Nicotine dependence, unspecified, uncomplicated: Secondary | ICD-10-CM | POA: Diagnosis present

## 2020-05-01 DIAGNOSIS — R531 Weakness: Secondary | ICD-10-CM | POA: Diagnosis present

## 2020-05-01 DIAGNOSIS — F1410 Cocaine abuse, uncomplicated: Secondary | ICD-10-CM | POA: Diagnosis present

## 2020-05-01 DIAGNOSIS — W25XXXA Contact with sharp glass, initial encounter: Secondary | ICD-10-CM | POA: Diagnosis present

## 2020-05-01 DIAGNOSIS — T50901A Poisoning by unspecified drugs, medicaments and biological substances, accidental (unintentional), initial encounter: Principal | ICD-10-CM | POA: Diagnosis present

## 2020-05-01 DIAGNOSIS — M4802 Spinal stenosis, cervical region: Secondary | ICD-10-CM | POA: Diagnosis present

## 2020-05-01 DIAGNOSIS — Z91013 Allergy to seafood: Secondary | ICD-10-CM

## 2020-05-01 DIAGNOSIS — R748 Abnormal levels of other serum enzymes: Secondary | ICD-10-CM

## 2020-05-01 DIAGNOSIS — R55 Syncope and collapse: Secondary | ICD-10-CM | POA: Diagnosis present

## 2020-05-01 DIAGNOSIS — B182 Chronic viral hepatitis C: Secondary | ICD-10-CM | POA: Diagnosis present

## 2020-05-01 DIAGNOSIS — R05 Cough: Secondary | ICD-10-CM | POA: Diagnosis present

## 2020-05-01 DIAGNOSIS — F1290 Cannabis use, unspecified, uncomplicated: Secondary | ICD-10-CM | POA: Diagnosis present

## 2020-05-01 DIAGNOSIS — W19XXXA Unspecified fall, initial encounter: Secondary | ICD-10-CM | POA: Diagnosis present

## 2020-05-01 DIAGNOSIS — F1990 Other psychoactive substance use, unspecified, uncomplicated: Secondary | ICD-10-CM

## 2020-05-01 DIAGNOSIS — R918 Other nonspecific abnormal finding of lung field: Secondary | ICD-10-CM | POA: Diagnosis present

## 2020-05-01 DIAGNOSIS — Z981 Arthrodesis status: Secondary | ICD-10-CM

## 2020-05-01 DIAGNOSIS — M542 Cervicalgia: Secondary | ICD-10-CM | POA: Diagnosis present

## 2020-05-01 DIAGNOSIS — M795 Residual foreign body in soft tissue: Secondary | ICD-10-CM | POA: Diagnosis present

## 2020-05-01 DIAGNOSIS — S91319A Laceration without foreign body, unspecified foot, initial encounter: Secondary | ICD-10-CM | POA: Diagnosis present

## 2020-05-01 DIAGNOSIS — I214 Non-ST elevation (NSTEMI) myocardial infarction: Secondary | ICD-10-CM | POA: Diagnosis present

## 2020-05-01 DIAGNOSIS — M5410 Radiculopathy, site unspecified: Secondary | ICD-10-CM | POA: Diagnosis present

## 2020-05-01 DIAGNOSIS — F1120 Opioid dependence, uncomplicated: Secondary | ICD-10-CM | POA: Diagnosis present

## 2020-05-01 DIAGNOSIS — M6282 Rhabdomyolysis: Secondary | ICD-10-CM | POA: Diagnosis present

## 2020-05-01 DIAGNOSIS — Z20822 Contact with and (suspected) exposure to covid-19: Secondary | ICD-10-CM | POA: Diagnosis present

## 2020-05-01 DIAGNOSIS — R778 Other specified abnormalities of plasma proteins: Secondary | ICD-10-CM | POA: Diagnosis present

## 2020-05-01 LAB — TROPONIN I (HIGH SENSITIVITY)
Troponin I (High Sensitivity): 295 ng/L (ref <18)
Troponin I (High Sensitivity): 264 ng/L (ref <18)
Troponin I (High Sensitivity): 274 ng/L (ref <18)

## 2020-05-01 LAB — URINALYSIS, ROUTINE W REFLEX MICROSCOPIC
Bacteria, UA: NONE SEEN
Bilirubin Urine: NEGATIVE
Glucose, UA: 150 mg/dL — AB
Hgb urine dipstick: NEGATIVE
Ketones, ur: NEGATIVE mg/dL
Leukocytes,Ua: NEGATIVE
Nitrite: NEGATIVE
Protein, ur: 30 mg/dL — AB
Specific Gravity, Urine: 1.026 (ref 1.005–1.030)
pH: 6 (ref 5.0–8.0)

## 2020-05-01 LAB — CBC
HCT: 37.2 % — ABNORMAL LOW (ref 39.0–52.0)
Hemoglobin: 12 g/dL — ABNORMAL LOW (ref 13.0–17.0)
MCH: 25.2 pg — ABNORMAL LOW (ref 26.0–34.0)
MCHC: 32.3 g/dL (ref 30.0–36.0)
MCV: 78.2 fL — ABNORMAL LOW (ref 80.0–100.0)
Platelets: 243 10*3/uL (ref 150–400)
RBC: 4.76 MIL/uL (ref 4.22–5.81)
RDW: 13.2 % (ref 11.5–15.5)
WBC: 5 10*3/uL (ref 4.0–10.5)
nRBC: 0 % (ref 0.0–0.2)

## 2020-05-01 LAB — BASIC METABOLIC PANEL
Anion gap: 10 (ref 5–15)
BUN: 16 mg/dL (ref 6–20)
CO2: 21 mmol/L — ABNORMAL LOW (ref 22–32)
Calcium: 9 mg/dL (ref 8.9–10.3)
Chloride: 105 mmol/L (ref 98–111)
Creatinine, Ser: 1.11 mg/dL (ref 0.61–1.24)
GFR calc Af Amer: >60 mL/min (ref >60)
GFR calc non Af Amer: >60 mL/min (ref >60)
Glucose, Bld: 143 mg/dL — ABNORMAL HIGH (ref 70–99)
Potassium: 3.6 mmol/L (ref 3.5–5.1)
Sodium: 136 mmol/L (ref 135–145)

## 2020-05-01 LAB — CBG MONITORING, ED: Glucose-Capillary: 103 mg/dL — ABNORMAL HIGH (ref 70–99)

## 2020-05-01 LAB — SARS CORONAVIRUS 2 BY RT PCR (HOSPITAL ORDER, PERFORMED IN ~~LOC~~ HOSPITAL LAB): SARS Coronavirus 2: NEGATIVE

## 2020-05-01 LAB — RAPID URINE DRUG SCREEN, HOSP PERFORMED
Amphetamines: NOT DETECTED
Barbiturates: NOT DETECTED
Benzodiazepines: NOT DETECTED
Cocaine: POSITIVE — AB
Opiates: POSITIVE — AB
Tetrahydrocannabinol: POSITIVE — AB

## 2020-05-01 LAB — LACTIC ACID, PLASMA
Lactic Acid, Venous: 1.9 mmol/L (ref 0.5–1.9)
Lactic Acid, Venous: 1.1 mmol/L (ref 0.5–1.9)

## 2020-05-01 MED ORDER — ACETAMINOPHEN 650 MG RE SUPP: 650.0000 mg | Freq: Four times a day (QID) | RECTAL | Status: DC | PRN

## 2020-05-01 MED ORDER — GADOBUTROL 1 MMOL/ML IV SOLN
7.5000 mL | Freq: Once | INTRAVENOUS | Status: AC | PRN
  Administered 2020-05-01: 7.5 mL via INTRAVENOUS

## 2020-05-01 MED ORDER — MORPHINE SULFATE (PF) 4 MG/ML IV SOLN
4.0000 mg | Freq: Once | INTRAVENOUS | Status: AC
  Administered 2020-05-01: 4 mg via INTRAVENOUS
  Filled 2020-05-01: qty 1

## 2020-05-01 MED ORDER — POLYETHYLENE GLYCOL 3350 17 G PO PACK: 17.0000 g | PACK | Freq: Every day | ORAL | Status: DC | PRN

## 2020-05-01 MED ORDER — ONDANSETRON HCL 4 MG/2ML IJ SOLN
4.0000 mg | Freq: Once | INTRAMUSCULAR | Status: AC
  Administered 2020-05-01: 4 mg via INTRAVENOUS
  Filled 2020-05-01: qty 2

## 2020-05-01 MED ORDER — ONDANSETRON HCL 4 MG/2ML IJ SOLN: 4.0000 mg | Freq: Four times a day (QID) | INTRAMUSCULAR | Status: DC | PRN

## 2020-05-01 MED ORDER — HYDROMORPHONE HCL 1 MG/ML IJ SOLN
0.5000 mg | INTRAMUSCULAR | Status: AC | PRN
  Administered 2020-05-02 (×2): 1 mg via INTRAVENOUS
  Filled 2020-05-01 (×2): qty 1

## 2020-05-01 MED ORDER — IOHEXOL 350 MG/ML SOLN
75.0000 mL | Freq: Once | INTRAVENOUS | Status: AC | PRN
  Administered 2020-05-01: 75 mL via INTRAVENOUS

## 2020-05-01 MED ORDER — RAMELTEON 8 MG PO TABS
8.0000 mg | ORAL_TABLET | Freq: Every evening | ORAL | Status: AC | PRN
  Administered 2020-05-02: 8 mg via ORAL
  Filled 2020-05-01: qty 1

## 2020-05-01 MED ORDER — NICOTINE 7 MG/24HR TD PT24
7.0000 mg | MEDICATED_PATCH | Freq: Every day | TRANSDERMAL | Status: DC | PRN
  Filled 2020-05-01: qty 1

## 2020-05-01 MED ORDER — ONDANSETRON HCL 4 MG PO TABS: 4.0000 mg | ORAL_TABLET | Freq: Four times a day (QID) | ORAL | Status: DC | PRN

## 2020-05-01 MED ORDER — ENOXAPARIN SODIUM 40 MG/0.4ML ~~LOC~~ SOLN
40.0000 mg | SUBCUTANEOUS | Status: DC
  Administered 2020-05-02 – 2020-05-04 (×3): 40 mg via SUBCUTANEOUS
  Filled 2020-05-01 (×3): qty 0.4

## 2020-05-01 MED ORDER — ACETAMINOPHEN 325 MG PO TABS: 650.0000 mg | ORAL_TABLET | Freq: Four times a day (QID) | ORAL | Status: DC | PRN

## 2020-05-01 NOTE — ED Triage Notes (Signed)
Pt arrives POV for eval of generalized weakness, shortness of breath, malaise, cough x 3 days. Pt reports he also recently stepped on a piece of glass 3 days ago and has ongoing pain there. Wound appears locally irritated, no streaking, no purulent drainage, no fevers/chills.

## 2020-05-01 NOTE — H&P (Addendum)
Date: 05/02/2020               Patient Name:  Michael Lester MRN: [id number]  DOB: June 24, 1986 Age / Sex: 34 y.o., male   PCP: Patient, [name]         Medical Service: Internal Medicine Teaching Service         Attending Physician: Dr. Reymundo Poll, MD    First Contact: Dr. Merrilyn Puma, MD Pager: [phone]  Second Contact: Dr. Hermine Messick, MD Pager: [phone]       After Hours (After 5p/  First Contact Pager: [phone]  weekends / holidays): Second Contact Pager: [phone]   Chief Complaint: neck pain, weakness  History of Present Illness: Michael Lester is a 34yo M w/ hx of C5-C7 osteomyelitis discitis and epidural abscess s/p anterior cervical discectomy w/ interbody fusion (12/22/19), hepatitis C, and IV drug use who presents with neck pain and generalized weakness after LOC. Patient was in his home 2-3d ago when he stepped on glass w/ L foot, causing him to fall.  Patient believes he hit his head during the fall.  He reports LOC for an unknown period of time before waking up at his father's house. Patient was told he walked to his father's house and was disoriented. Michael Lester has no recollection of walking to the house. Since then patient reports extreme neck pain radiating towards back of the right shoulder and down right arm. He says pain is similar to last hospitalization in February. Also endorses mild HA and tingling in the 4th and 5th digits on R hand. In addition, he describes general malaise, myalgias, mild SOB, and cough that began after the incident. Denies fever, CP, palpitations, abdominal pain, dysphagia, n/v, dysuria. Pt reports chronic intermittent diarrhea. No sick contacts or recent travel.   Upon arrival in ED, temp 36.4C, tachypneic at 23. Had significant neck/cervical spinal tenderness. No leukocytosis, BMP unremarkable. CT head & neck with no acute finding, but given hx of IVDU, MRI ordered to evaluate for osteomyelitis/epidural abscess and IMTS consulted for  admission. During work-up, also had elevated troponin w/o ST changes on EKG.  Past Medical History:  -Osteomyelitis C5-C7 (11/2019)  -Epidural abscess of cervical spine (11/2019)  -Chronic hepatitis C  Meds: Patient currently not taking any prescribed medications.  Allergies: Allergies as of 05/01/2020 - Review Complete 05/01/2020  Allergen Reaction Noted  . Shellfish allergy Anaphylaxis 11/17/2014   Family History: No FHx of cardiac disease. Reports brother has history of seizures.  Social History: Patient lives in [place], [place] with others (did not specify). Smokes 0.5 pack cigarettes for past 16 years. Denies alcohol use. Uses IV heroin (last use 2 weeks ago), but endorses desire to quit. Smokes marijuana (last use 04/30/20). Denies cocaine.  Review of Systems: A complete ROS was negative except as per HPI.   Physical Exam: Blood pressure 124/73, pulse 85, temperature 98.9 F (37.2 C), temperature source Oral, resp. rate 11, height 6\' 2"  (1.88 m), weight 65.3 kg, SpO2 100 %. Physical Exam Constitutional:      General: He is not in acute distress.    Appearance: He is ill-appearing. He is not diaphoretic.  HENT:     Head: Normocephalic and atraumatic.     Mouth/Throat:     Mouth: Mucous membranes are moist. No lacerations or oral lesions.     Dentition: Abnormal dentition. Dental caries present.     Tongue: No lesions.     Pharynx: Oropharynx is clear.  Uvula midline.  Eyes:     General: Lids are normal. No scleral icterus.    Extraocular Movements: Extraocular movements intact.     Conjunctiva/sclera: Conjunctivae normal.     Comments: Anisocoria present. Both pupils round, reactive to light.  Neck:     Comments: Posterior neck mildly swollen, erythematous. Exquisitely tender to touch in general area, especially over C3-C5.   Cardiovascular:     Rate and Rhythm: Normal rate and regular rhythm.     Heart sounds: Normal heart sounds. No murmur heard.  No friction rub. No  gallop.   Pulmonary:     Comments: Poor effort 2/2 pain. No use of accessory muscles. No crackles or wheezes bilaterally. Abdominal:     General: Abdomen is flat. Bowel sounds are normal. There is no distension.     Palpations: Abdomen is soft.     Tenderness: There is no abdominal tenderness.  Musculoskeletal:     Cervical back: Edema and erythema present. No signs of trauma. Pain with movement present. Decreased range of motion.       Back:     Right lower leg: No edema.     Left lower leg: No edema.       Feet:     Comments: R scapula/posterior shoulder slightly erythematous, swollen, and extremely tender to touch. Unable to evaluate full ROM in R shoulder, but pt able to move arm in multiple directions.   Feet:     Left foot:     Skin integrity: Fissure present.     Comments: Laceration on lateral, plantar portion of L foot. Multiple small abrasions throughout plantar L foot. Skin:    General: Skin is warm.     Coloration: Skin is not jaundiced.  Neurological:     Mental Status: He is alert.     Cranial Nerves: No dysarthria or facial asymmetry.     Sensory: Sensory deficit present.     Motor: Motor function is intact.     Comments: Sensation decreased in R hand digits, hand, wrist, forearm. Sensation in tact in lower extremities. Unable to assess CNXI d/t patient's pain.  Psychiatric:        Behavior: Behavior is cooperative.      Labs: -BMP: Na 136, K 3.6, Cl 105, CO2 21, Glucose 143, BUN 16, Cr 1.11, Ca 9.0  -CBC: WBC 5.0, Hgb 12.0, Hct 37.2, Plt 243, MCV 78.2, MCH 25.2, MCHC 32.3, RDW 13.2  -Troponin: 295   -Lactic acid: 1.9  -UA: SG 1.026, pH 6.0, Glucose 150, Protein 30, Bili/Ketones/Nitrie/Leuk neg, RBC 0, WBC 11-20, No bacteria seen  -UDS: Positive for opiates, cocaine, THC  -Covid-19: Negaitve  Imaging: EKG: personally reviewed my interpretation is sinus rhythm, right atrial enlargement, right axis deviation  CXR: personally reviewed my interpretation  is no acute process involving cardiopulmonary system.  L Foot XR: No acute osseous pathology. 58mm radiopaque foreign object in plantar superficial soft tissue of hindfoot  CT head/cervical spine: No evidence for acute intracranial or acute cervical spine abnormality. Interval anterior fusion of C5-C7 w/ interbody fusion at C5-6 and C6-7.  CT Angio: No evidence of PE. Mild multifocal anterior bilateral upper lobe infiltrates.  MRI cervical spine: No MRI evidence for acute infection within cervical spine. Prior ACDF at C5-C7 without residual spinal stenosis. Residual uncovertebral disease with persistent moderate right worse than left C6 and C7 foraminal stenosis. Shallow central disc protrusion at C4-5 without stenosis or impingement.  Assessment & Plan by Problem: Active Problems:  Chronic hepatitis C without hepatic coma (HCC)   Neck pain   IVDU (intravenous drug user)   Laceration of foot   Elevated liver enzymes  Patient is 34yo M with IVDU, history of cervical spine osteomyelitis discitis, cervical epidural abscesses (s/p C5-C7 anterior cervical discectomy and interbody fusion 11/2019), and untreated chronic hepatitis C who presents with cervical neck/posterior shoulder pain from fall.  #Cervical spine/shoulder blade pain after fall #Radiculopathy Given erythema and extreme tenderness, initially thought to be soft tissue infection vs osteomyelitis vs injury in setting of previous infection and IVDU. Pt reported finishing all abx from previous infection. Imaging and labs revealed no signs of infection or fractures. R arm radiculopathy appears to be acute on chronic.  -Afebrile, HDS, WBC 5.0 on admission -Imaging: CT head/cervical spine, MRI cervical spine unremarkable -Pain control:  IV Dilaudid 0.5-1mg  q4hr PRN -BCx pending -Neuro checks q4hr  #Elevated Troponin Patient asymptomatic. EKG without ST changes -Trending troponin: 295 - 264 - 274 - 205  #Loss of  Consciousness Patient stepped on glass w/ L foot and fell. He has no recollection of anything afterward, although reportedly walked to father's house. Denies prodromal symptoms. Etiology of patient's LOC includes vasovagal vs syncope vs intox vs seizure. Per discharge summary from last hospitalization, syncope work-up negative (Echo 11/2019 - EF 65-70%) unrevealing. Seizure unlikely given timeline of patient's story, although cannot rule out. Given positive UDS, it is possible intoxication lead to fall and subsequent LOC.  -CT head: without acute abnormalities -UDS positive for cocaine, opiates, THC -EtOH neg -Tele -Seizure precautions -CK pending  #Hypokalemia, hypomagnesemia Initial BMP unremarkable, repeat CMP revealed hypokalemia.  -K 2.8, Mg 1.6 -Replaced K, Mg  #Foot laceration Laceration does not appear to be infected, with only minimal erythema surrounding wound. Foot x-ray without fracture, however showed 36mm radiopaque foreign body.  -Last Tdap 11/16/2014 -Check daily for further spread of infection -Consider podiatry consult in AM given foreign body found in x-ray  #Elevated LFTs #Chronic hepatitis C Untreated. Reported on last hospitalization. Pt was supposed to f/u in ID outpatient but was unable to make it. Current levels more consistent with alcohol hepatitis. -AST/ALT 451/152 (previously 92/312). Ratio more consistent w/ EtOH but level came back neg -Set up with ID outpatient  #IV drug use Reported last use of IV drugs 2 weeks ago. UDS on admission revealed cocaine, opiates, and THC. Reports desire to quit. -Monitor narcotics (pt still required q4hr IV dilaudid d/t neck pain - will try to avoid additional doses if possible) -SW consult to assist with resources for rehab  Diet: Regular IVF: NS 175mL/hr DVT PPX: Lovenox 40mg  daily Code Status: Full  Dispo: Admit patient to Inpatient with expected length of stay greater than 2 midnights.  Signed: ,  MD 05/02/2020, 5:21 AM  Pager: @[phone] @ After 5pm on weekdays and 1pm on weekends: On Call pager: [phone]

## 2020-05-01 NOTE — Hospital Course (Addendum)
Admitted 05/01/2020  Allergies: Shellfish allergy Pertinent Hx: IV drug (Heroin) use, Hx of discitis/osteomyelitis of C6-7 & epidural abscess s/p discectomy and interbody fusion on 12/22/2019, Hep C   34 y.o. male p/w neck pain and generalized weakness after fall and left foot laceration   *Generalized weakness, neck pain: Occurred after the fall yesterday. Also has radiculopathy. Sever tenderness on exam and given IV drug use, and swelling, erythema and tenderness, admitted to evaluate for oseto/epidural abscess. CT neck w/o infection or Fx. Cervical spine MRI resulted after admission, with no evidence of acute infection (osteomyelitis discitis or septic arthritis) of cervical spine. Pain control. If no improvement, may consider shoulder imaging as well.  *Troponinemia: Troponins: 295>264. EKG w/o ST-T changes. No chest pain. Trending Trop and keeping on tele. Prior echo 2/21, unremarkable. May consider repeating EKG and echo if raises concern for endocarditis or myocarditis.  *Laceration to sole of left foot laceration: Stepped on a piece of glass yesterday and fell. He had LOC after that. Head CT unremarkable. Has laceration w/o active bleeding or repairable wound. Foreign body reported on Xray. Podiatry or ortho consult in AM.  *Substance abuse: Last Heroin use, 2 weeks ago. UDS pending.   Consults: None Meds: PRN IV dilaudid, PRN zofran VTE ppx: Lovenox IVF: NS 125 ml/h Diet: regular

## 2020-05-01 NOTE — ED Notes (Signed)
Pt made aware of the need for urine and provided a urinal. 

## 2020-05-01 NOTE — ED Notes (Signed)
Patient transported to CT 

## 2020-05-01 NOTE — ED Provider Notes (Signed)
[facility] Provider Note   CSN: [id number] Arrival date & time: 05/01/20  1534     History Chief Complaint  Patient presents with  . Weakness    [facility] is a 34 y.o. male with past medical history significant for abscess in epidural space of cervical spine, osteomyelitis of C6-7, hepatitis C, substance abuse.  HPI Patient presents to emergency department today with chief complaint of generalized weakness and laceration x2 days.  Patient states yesterday while barefoot he stepped on a piece of glass.  He then fell to the ground and passed out.  He is unsure how long he was unconscious for.  The next thing he remembers is waking up in his dad's house.  He states he was able to pull out a large piece of glass from his foot.  He describes his generalized weakness as feeling very unwell.  He has a nonproductive cough and progressively worsening neck pain.  Pain radiates to his right shoulder and down his right arm.  He also admits to intermittent tingling and numbness in his right hand.  He states he had this before when he had an abscess in his neck. Patient states he last used IV heroin x 2 weeks ago. He typically injections in right arm. Tetanus is up to date.  Surgical history includes anterior cervical decompression/discectomy fusion 2 levels C5/6 C6/7 with Dr. Jordan Likes on 12/21/2019.   History reviewed. No pertinent past medical history.  Patient Active Problem List   Diagnosis Date Noted  . Hepatitis C antibody positive in blood 01/21/2020  . Severe sepsis (HCC) 12/30/2019  . Abscess in epidural space of cervical spine 12/21/2019  . Opioid dependence, daily use (HCC) 12/21/2019  . Chronic hepatitis C without hepatic coma (HCC) 12/21/2019  . Osteomyelitis of C6-7 12/20/2019  . Injection of illicit drug within last 12 months 12/20/2019  . Syncope   . Multiple lacerations 11/17/2014    Past Surgical History:  Procedure Laterality Date  .  ANTERIOR CERVICAL DECOMP/DISCECTOMY FUSION N/A 12/22/2019   Procedure: cervical five-six, cervical six-seven anterior cervical discectomy with interbody fusion;  Surgeon: Julio Sicks, MD;  Location: [facility] OR;  Service: Neurosurgery;  Laterality: N/A;  . LACERATION REPAIR N/A 11/16/2014   Procedure: REPAIR MULTIPLE LACERATIONS;  Surgeon: Melvenia Beam, MD;  Location: [facility] OR;  Service: ENT;  Laterality: N/A;  . LACERATION REPAIR Left 11/16/2014   Procedure: REPAIR MULTIPLE LACERATIONS;  Surgeon: Manus Rudd, MD;  Location: [facility] OR;  Service: General;  Laterality: Left;  irrigation and suturing of lacerations on upper body       History reviewed. No pertinent family history.  Social History   Tobacco Use  . Smoking status: Current Every Day Smoker    Packs/day: 0.50  . Smokeless tobacco: Never Used  Vaping Use  . Vaping Use: Never used  Substance Use Topics  . Alcohol use: Not Currently  . Drug use: Yes    Types: Marijuana, Heroin    Comment: daily     Home Medications Prior to Admission medications   Medication Sig Start Date End Date Taking? Authorizing Provider  gabapentin (NEURONTIN) 100 MG capsule Take 1 capsule (100 mg total) by mouth 3 (three) times daily. Patient not taking: Reported on 05/01/2020 02/02/20   Leatha Gilding, MD  nicotine (NICODERM CQ - DOSED IN MG/24 HOURS) 14 mg/24hr patch Place 1 patch (14 mg total) onto the skin daily. Patient not taking: Reported on 05/01/2020 02/02/20   Elvera Lennox,  Daylene Katayama, MD    Allergies    Shellfish allergy  Review of Systems   Review of Systems  Physical Exam Updated Vital Signs BP 113/77   Pulse 87   Temp (!) 97.5 F (36.4 C) (Oral)   Resp (!) 23   Ht  (1.854 m)   Wt 72 kg   SpO2 96%   BMI 20.94 kg/m   Physical Exam Vitals and nursing note reviewed.  Constitutional:      General: He is not in acute distress.    Appearance: He is ill-appearing.  HENT:     Head: Normocephalic and atraumatic.     Right Ear: Tympanic  membrane and external ear normal.     Left Ear: Tympanic membrane and external ear normal.     Nose: Nose normal.     Mouth/Throat:     Mouth: Mucous membranes are dry.     Pharynx: Oropharynx is clear.  Eyes:     General: No scleral icterus.       Right eye: No discharge.        Left eye: No discharge.     Extraocular Movements: Extraocular movements intact.     Conjunctiva/sclera: Conjunctivae normal.     Pupils: Pupils are equal, round, and reactive to light.  Neck:     Vascular: No JVD.     Comments: Decreased ROM 2/2 pain.  No bony step-offs or deformities.  Patient has exquisite tenderness to palpation of midline spinous processes.  No crepitus.  No overlying skin changes.  Also has bilateral paraspinous muscle TTP or muscle spasms. No rigidity or meningeal signs. No bruising, erythema, or swelling.  Cardiovascular:     Rate and Rhythm: Normal rate and regular rhythm.     Pulses: Normal pulses.          Radial pulses are 2+ on the right side and 2+ on the left side.       Dorsalis pedis pulses are 2+ on the right side and 2+ on the left side.     Heart sounds: Normal heart sounds.  Pulmonary:     Comments: Lungs clear to auscultation in all fields. Symmetric chest rise. No wheezing, rales, or rhonchi. Abdominal:     Comments: Abdomen is soft, non-distended, and non-tender in all quadrants. No rigidity, no guarding. No peritoneal signs.  Musculoskeletal:        General: Normal range of motion.       Feet:     Comments: Full range of motion of the T-spine and L-spine No tenderness to palpation of the spinous processes of the T-spine or L-spine No crepitus, deformity or step-offs No tenderness to palpation of the paraspinous muscles of the L-spine      Feet:     Comments: V-shaped laceration to sole of left foot as depicted in image above.  Area is tender to palpation.  There is no purulent drainage or active bleeding.  No foreign body appreciated.  No palpable fluctuance or  gross abscess. Skin:    General: Skin is warm and dry.     Capillary Refill: Capillary refill takes less than 2 seconds.     Comments: Track marks seen on right upper extremity.  No signs of abscess  Neurological:     Mental Status: He is oriented to person, place, and time.     GCS: GCS eye subscore is 4. GCS verbal subscore is 5. GCS motor subscore is 6.     Comments: Fluent  speech, no facial droop.  Strong equal grip strength in bilateral upper and lower extremities.  Psychiatric:        Behavior: Behavior normal.     ED Results / Procedures / Treatments   Labs (all labs ordered are listed, but only abnormal results are displayed) Labs Reviewed  BASIC METABOLIC PANEL - Abnormal; Notable for the following components:      Result Value   CO2 21 (*)    Glucose, Bld 143 (*)    All other components within normal limits  CBC - Abnormal; Notable for the following components:   Hemoglobin 12.0 (*)    HCT 37.2 (*)    MCV 78.2 (*)    MCH 25.2 (*)    All other components within normal limits  CBG MONITORING, ED - Abnormal; Notable for the following components:   Glucose-Capillary 103 (*)    All other components within normal limits  TROPONIN I (HIGH SENSITIVITY) - Abnormal; Notable for the following components:   Troponin I (High Sensitivity) 295 (*)    All other components within normal limits  TROPONIN I (HIGH SENSITIVITY) - Abnormal; Notable for the following components:   Troponin I (High Sensitivity) 264 (*)    All other components within normal limits  SARS CORONAVIRUS 2 BY RT PCR (HOSPITAL ORDER, PERFORMED IN [facility])  LACTIC ACID, PLASMA  LACTIC ACID, PLASMA  URINALYSIS, ROUTINE W REFLEX MICROSCOPIC  RAPID URINE DRUG SCREEN, HOSP PERFORMED    EKG EKG Interpretation  Date/Time:  Sunday May 01 2020 16:06:59 EDT Ventricular Rate:  93 PR Interval:  120 QRS Duration: 90 QT Interval:  370 QTC Calculation: 460 R Axis:   92 Text  Interpretation: Normal sinus rhythm Right atrial enlargement Rightward axis Pulmonary disease pattern Abnormal ECG Confirmed by Pricilla Loveless [phone]) on 05/01/2020 5:04:48 PM   Radiology CT Head Wo Contrast  Result Date: 05/01/2020 CLINICAL DATA:  Neck pain. Generalized weakness, malaise, and cough for 3 days. Shortness of breath. History of cervical fusion. EXAM: CT HEAD WITHOUT CONTRAST CT CERVICAL SPINE WITHOUT CONTRAST TECHNIQUE: Multidetector CT imaging of the head and cervical spine was performed following the standard protocol without intravenous contrast. Multiplanar CT image reconstructions of the cervical spine were also generated. COMPARISON:  12/19/2019 FINDINGS: CT HEAD FINDINGS Brain: No evidence of acute infarction, hemorrhage, hydrocephalus, extra-axial collection or mass lesion/mass effect. Vascular: No hyperdense vessel or unexpected calcification. Skull: Normal. Negative for fracture or focal lesion. Sinuses/Orbits: No acute finding. Other: None. CT CERVICAL SPINE FINDINGS Alignment: Interval anterior fusion of C5-C7 with interbody fusion at C5-6 and C6-7. Alignment is normal. Skull base and vertebrae: No acute fracture. No primary bone lesion or focal pathologic process. Soft tissues and spinal canal: No prevertebral fluid or swelling. No visible canal hematoma. Disc levels:  Unremarkable. Upper chest: Biapical paraseptal emphysematous changes. Other: None IMPRESSION: 1. No evidence for acute intracranial abnormality. 2. Interval anterior fusion of C5-C7 with interbody fusion at C5-6 and C6-7. 3. No evidence for acute cervical spine abnormality. 4. Emphysema (ICD10-J43.9). Electronically Signed   By: Norva Pavlov M.D.   On: 05/01/2020 19:50   CT Angio Chest PE W/Cm &/Or Wo Cm  Result Date: 05/01/2020 CLINICAL DATA:  Weakness and shortness of breath. EXAM: CT ANGIOGRAPHY CHEST WITH CONTRAST TECHNIQUE: Multidetector CT imaging of the chest was performed using the standard protocol  during bolus administration of intravenous contrast. Multiplanar CT image reconstructions and MIPs were obtained to evaluate the vascular anatomy. CONTRAST:  67mL OMNIPAQUE  IOHEXOL 350 MG/ML SOLN COMPARISON:  None. FINDINGS: Cardiovascular: Satisfactory opacification of the pulmonary arteries to the segmental level. No evidence of pulmonary embolism. Normal heart size. No pericardial effusion. Mediastinum/Nodes: No enlarged mediastinal, hilar, or axillary lymph nodes. Thyroid gland, trachea, and esophagus demonstrate no significant findings. Lungs/Pleura: Multiple small subpleural cysts are seen within the bilateral apices. Mild multifocal anterior bilateral upper lobe infiltrates are seen. There is no evidence of a pleural effusion or pneumothorax. Upper Abdomen: No acute abnormality. Musculoskeletal: No chest wall abnormality. No acute or significant osseous findings. Review of the MIP images confirms the above findings. IMPRESSION: 1. No evidence of pulmonary embolism. 2. Mild multifocal anterior bilateral upper lobe infiltrates. Electronically Signed   By: Aram Candela M.D.   On: 05/01/2020 19:47   CT Cervical Spine Wo Contrast  Result Date: 05/01/2020 CLINICAL DATA:  Neck pain. Generalized weakness, malaise, and cough for 3 days. Shortness of breath. History of cervical fusion. EXAM: CT HEAD WITHOUT CONTRAST CT CERVICAL SPINE WITHOUT CONTRAST TECHNIQUE: Multidetector CT imaging of the head and cervical spine was performed following the standard protocol without intravenous contrast. Multiplanar CT image reconstructions of the cervical spine were also generated. COMPARISON:  12/19/2019 FINDINGS: CT HEAD FINDINGS Brain: No evidence of acute infarction, hemorrhage, hydrocephalus, extra-axial collection or mass lesion/mass effect. Vascular: No hyperdense vessel or unexpected calcification. Skull: Normal. Negative for fracture or focal lesion. Sinuses/Orbits: No acute finding. Other: None. CT CERVICAL SPINE  FINDINGS Alignment: Interval anterior fusion of C5-C7 with interbody fusion at C5-6 and C6-7. Alignment is normal. Skull base and vertebrae: No acute fracture. No primary bone lesion or focal pathologic process. Soft tissues and spinal canal: No prevertebral fluid or swelling. No visible canal hematoma. Disc levels:  Unremarkable. Upper chest: Biapical paraseptal emphysematous changes. Other: None IMPRESSION: 1. No evidence for acute intracranial abnormality. 2. Interval anterior fusion of C5-C7 with interbody fusion at C5-6 and C6-7. 3. No evidence for acute cervical spine abnormality. 4. Emphysema (ICD10-J43.9). Electronically Signed   By: Norva Pavlov M.D.   On: 05/01/2020 19:50   DG Chest Portable 1 View  Result Date: 05/01/2020 CLINICAL DATA:  34 year old male with cough. EXAM: PORTABLE CHEST 1 VIEW COMPARISON:  Chest radiograph dated 12/19/2019. FINDINGS: There is no focal consolidation, pleural effusion, or pneumothorax. The cardiac silhouette is within normal limits. There is apparent slight shift of the mediastinum to the right of the midline which may be artifactual and related to patient's rotation. A centrally occlusive process with hyperexpansion of the left lung is less likely but not excluded. Clinical correlation is recommended. Repeat radiograph with better positioning of the patient may provide better evaluation. No acute osseous pathology. Partially visualized lower cervical ACDF. IMPRESSION: 1. No acute cardiopulmonary process. 2. Apparent slight shift of the mediastinum to the right of the midline may be artifactual and related to patient's position. Clinical correlation is recommended. Electronically Signed   By: Elgie Collard M.D.   On: 05/01/2020 18:05   DG Foot 2 Views Left  Result Date: 05/01/2020 CLINICAL DATA:  33 year old male with multiple puncture wounds to the plantar surface of the left foot from stepping on glass. EXAM: LEFT FOOT - 2 VIEW COMPARISON:  None. FINDINGS:  There is no acute fracture or dislocation. The bones are well mineralized. No arthritic changes. There is a 5 mm radiopaque foreign object in the plantar superficial soft tissues of the hindfoot approximately 5 mm deep to the skin most consistent with retained foreign object/glass fragment. IMPRESSION: 1. No  acute osseous pathology. 2. A 5 mm radiopaque foreign object in the plantar superficial soft tissues of the hindfoot. Electronically Signed   By: Elgie CollardArash  Radparvar M.D.   On: 05/01/2020 18:07    Procedures Procedures (including critical care time)  Medications Ordered in ED Medications  morphine 4 MG/ML injection 4 mg (4 mg Intravenous Given 05/01/20 2011)  ondansetron (ZOFRAN) injection 4 mg (4 mg Intravenous Given 05/01/20 2011)  iohexol (OMNIPAQUE) 350 MG/ML injection 75 mL (75 mLs Intravenous Contrast Given 05/01/20 1927)    ED Course  I have reviewed the triage vital signs and the nursing notes.  Pertinent labs & imaging results that were available during my care of the patient were reviewed by me and considered in my medical decision making (see chart for details).  Clinical Course as of May 01 2054  Wynelle LinkSun May 01, 2020  1754 Patient denies CP. + sob, generalized weakness  Troponin I (High Sensitivity)(!!): 295 [KA]    Clinical Course User Index [KA] Eliannah Hinde, Caroleen HammanKaitlyn E, PA-C   MDM Rules/Calculators/A&P                          History provided by patient with additional history obtained from chart review.    Patient seen and examined. Patient presents awake, alert, hemodynamically stable, afebrile.  Patient is ill-appearing however vitals do not indicate sepsis.  On exam he looks dehydrated, mucous membranes are dry.  He has exquisite tenderness to palpation of posterior neck including midline tenderness and tenderness to bilateral paraspinal muscles.  No crepitus.  No meningeal signs.  His lungs are clear to auscultation all fields.  He is alert and oriented and has strong equal grip  strength in bilateral upper and lower extremities.  He does have a laceration to sole of left foot that he obtained while stepping on a piece of glass barefoot yesterday.  Laceration is not gaping and not amendable to suture repair.  There is no purulent drainage, no gross abscess or palpable fluctuance.  I do not appreciate any foreign body on exam.  X-ray of his left foot shows there is a foreign body however when reassessing patient I still cannot palpate it.  Labs were ordered in triage.  Viewed results which show no leukocytosis, hemoglobin consistent with baseline.  No severe electrolyte derangement, no renal insufficiency.  No hypoglycemia.  Lactic acid is within normal range.  Troponin is elevated at 295, EKG without STEMI.  Second troponin is downtrending at 264.  UA and UDS not yet collected.  Given patient's presentation and his history of IV drug use there is concern for infection within his neck, myocarditis, PE.  Because of his mechanical fall yesterday CT head and cervical spine ordered.  There are no acute traumatic findings.  CTA chest is without PE. Radiologist comments on mild multifocal anterior bilateral upper lobe infiltrates.  Xray of left foot shows foreign body in plantar superficial soft tissues of the hindfoot. This case was discussed with Dr. Criss AlvineGoldston who has seen the patient and agrees with plan to admit given history of IV drug use, neck pain concern for infection, elevated troponins. MR cervical spine ordered, however patient has not had imaging performed prior to admission. Unassigned admission.  Spoke with IM service who agrees to assume care of patient and bring into the hospital for further evaluation and management.     Portions of this note were generated with Scientist, clinical (histocompatibility and immunogenetics)Dragon dictation software. Dictation errors may occur despite  best attempts at proofreading.   Final Clinical Impression(s) / ED Diagnoses Final diagnoses:  Elevated troponin  Generalized weakness  Neck pain     Rx / DC Orders ED Discharge Orders    None       Sherene Sires, PA-C 05/02/20 1133    Pricilla Loveless, MD 05/07/20 [phone]

## 2020-05-02 ENCOUNTER — Encounter (HOSPITAL_COMMUNITY): Payer: Self-pay | Admitting: Internal Medicine

## 2020-05-02 ENCOUNTER — Inpatient Hospital Stay (HOSPITAL_COMMUNITY): Payer: Self-pay

## 2020-05-02 ENCOUNTER — Other Ambulatory Visit: Payer: Self-pay

## 2020-05-02 DIAGNOSIS — R748 Abnormal levels of other serum enzymes: Secondary | ICD-10-CM

## 2020-05-02 DIAGNOSIS — T50901A Poisoning by unspecified drugs, medicaments and biological substances, accidental (unintentional), initial encounter: Secondary | ICD-10-CM

## 2020-05-02 DIAGNOSIS — R778 Other specified abnormalities of plasma proteins: Secondary | ICD-10-CM

## 2020-05-02 DIAGNOSIS — I361 Nonrheumatic tricuspid (valve) insufficiency: Secondary | ICD-10-CM

## 2020-05-02 DIAGNOSIS — F1990 Other psychoactive substance use, unspecified, uncomplicated: Secondary | ICD-10-CM

## 2020-05-02 DIAGNOSIS — I214 Non-ST elevation (NSTEMI) myocardial infarction: Secondary | ICD-10-CM

## 2020-05-02 DIAGNOSIS — S91319A Laceration without foreign body, unspecified foot, initial encounter: Secondary | ICD-10-CM

## 2020-05-02 DIAGNOSIS — I340 Nonrheumatic mitral (valve) insufficiency: Secondary | ICD-10-CM

## 2020-05-02 DIAGNOSIS — M6282 Rhabdomyolysis: Secondary | ICD-10-CM

## 2020-05-02 LAB — COMPREHENSIVE METABOLIC PANEL
ALT: 152 U/L — ABNORMAL HIGH (ref 0–44)
AST: 451 U/L — ABNORMAL HIGH (ref 15–41)
Albumin: 3.6 g/dL (ref 3.5–5.0)
Alkaline Phosphatase: 107 U/L (ref 38–126)
Anion gap: 9 (ref 5–15)
BUN: 13 mg/dL (ref 6–20)
CO2: 21 mmol/L — ABNORMAL LOW (ref 22–32)
Calcium: 8.7 mg/dL — ABNORMAL LOW (ref 8.9–10.3)
Chloride: 104 mmol/L (ref 98–111)
Creatinine, Ser: 1.06 mg/dL (ref 0.61–1.24)
GFR calc Af Amer: >60 mL/min (ref >60)
GFR calc non Af Amer: >60 mL/min (ref >60)
Glucose, Bld: 150 mg/dL — ABNORMAL HIGH (ref 70–99)
Potassium: 2.8 mmol/L — ABNORMAL LOW (ref 3.5–5.1)
Sodium: 134 mmol/L — ABNORMAL LOW (ref 135–145)
Total Bilirubin: 1.5 mg/dL — ABNORMAL HIGH (ref 0.3–1.2)
Total Protein: 6.8 g/dL (ref 6.5–8.1)

## 2020-05-02 LAB — BASIC METABOLIC PANEL
Anion gap: 8 (ref 5–15)
BUN: 10 mg/dL (ref 6–20)
CO2: 22 mmol/L (ref 22–32)
Calcium: 8.2 mg/dL — ABNORMAL LOW (ref 8.9–10.3)
Chloride: 107 mmol/L (ref 98–111)
Creatinine, Ser: 0.95 mg/dL (ref 0.61–1.24)
GFR calc Af Amer: >60 mL/min (ref >60)
GFR calc non Af Amer: >60 mL/min (ref >60)
Glucose, Bld: 115 mg/dL — ABNORMAL HIGH (ref 70–99)
Potassium: 3.4 mmol/L — ABNORMAL LOW (ref 3.5–5.1)
Sodium: 137 mmol/L (ref 135–145)

## 2020-05-02 LAB — ETHANOL: Alcohol, Ethyl (B): <10 mg/dL (ref <10)

## 2020-05-02 LAB — ECHOCARDIOGRAM COMPLETE
Height: 74 in
Weight: 2303.37 oz

## 2020-05-02 LAB — HEMOGLOBIN A1C
Hgb A1c MFr Bld: 5.5 % (ref 4.8–5.6)
Mean Plasma Glucose: 111.15 mg/dL

## 2020-05-02 LAB — CBC
HCT: 36.8 % — ABNORMAL LOW (ref 39.0–52.0)
Hemoglobin: 12 g/dL — ABNORMAL LOW (ref 13.0–17.0)
MCH: 25.9 pg — ABNORMAL LOW (ref 26.0–34.0)
MCHC: 32.6 g/dL (ref 30.0–36.0)
MCV: 79.5 fL — ABNORMAL LOW (ref 80.0–100.0)
Platelets: 225 10*3/uL (ref 150–400)
RBC: 4.63 MIL/uL (ref 4.22–5.81)
RDW: 13.3 % (ref 11.5–15.5)
WBC: 5.3 10*3/uL (ref 4.0–10.5)
nRBC: 0 % (ref 0.0–0.2)

## 2020-05-02 LAB — CK
Total CK: 8037 U/L — ABNORMAL HIGH (ref 49–397)
Total CK: 4501 U/L — ABNORMAL HIGH (ref 49–397)

## 2020-05-02 LAB — MAGNESIUM: Magnesium: 1.6 mg/dL — ABNORMAL LOW (ref 1.7–2.4)

## 2020-05-02 LAB — TROPONIN I (HIGH SENSITIVITY): Troponin I (High Sensitivity): 208 ng/L (ref <18)

## 2020-05-02 MED ORDER — HYDROMORPHONE HCL 1 MG/ML IJ SOLN: 0.5000 mg | INTRAMUSCULAR | Status: DC | PRN

## 2020-05-02 MED ORDER — HYDROMORPHONE HCL 1 MG/ML IJ SOLN
0.5000 mg | INTRAMUSCULAR | Status: DC | PRN
  Administered 2020-05-02: 0.5 mg via INTRAVENOUS
  Filled 2020-05-02: qty 1

## 2020-05-02 MED ORDER — SODIUM CHLORIDE 0.9 % IV SOLN: INTRAVENOUS | Status: DC

## 2020-05-02 MED ORDER — HYDROMORPHONE HCL 1 MG/ML IJ SOLN
1.0000 mg | INTRAMUSCULAR | Status: DC | PRN
  Administered 2020-05-02 – 2020-05-03 (×6): 1 mg via INTRAVENOUS
  Filled 2020-05-02 (×6): qty 1

## 2020-05-02 MED ORDER — MAGNESIUM SULFATE 2 GM/50ML IV SOLN
2.0000 g | Freq: Once | INTRAVENOUS | Status: AC
  Administered 2020-05-02: 2 g via INTRAVENOUS
  Filled 2020-05-02: qty 50

## 2020-05-02 MED ORDER — POTASSIUM CHLORIDE CRYS ER 20 MEQ PO TBCR
40.0000 meq | EXTENDED_RELEASE_TABLET | Freq: Once | ORAL | Status: AC
  Administered 2020-05-02: 40 meq via ORAL
  Filled 2020-05-02: qty 2

## 2020-05-02 MED ORDER — POTASSIUM CHLORIDE 10 MEQ/100ML IV SOLN
10.0000 meq | Freq: Once | INTRAVENOUS | Status: AC
  Administered 2020-05-02: 10 meq via INTRAVENOUS
  Filled 2020-05-02: qty 100

## 2020-05-02 MED ORDER — SODIUM CHLORIDE 0.9 % IV BOLUS
1000.0000 mL | Freq: Once | INTRAVENOUS | Status: AC
  Administered 2020-05-02: 1000 mL via INTRAVENOUS

## 2020-05-02 MED ORDER — POTASSIUM CHLORIDE 10 MEQ/100ML IV SOLN
10.0000 meq | INTRAVENOUS | Status: DC
  Administered 2020-05-02 (×3): 10 meq via INTRAVENOUS
  Filled 2020-05-02 (×4): qty 100

## 2020-05-02 NOTE — Progress Notes (Signed)
Subjective: Patient interviewed at bedside.  Reports severe neck pain. When asked about his fall, states that he remembers stepping on a piece of glass but that is the last thing that remembers until waking up at his father's place.  States that he does not want to hurt anymore and that he just wants to be able to sleep.  Reports using IV drugs for years with last use of IV heroin being "a couple of weeks ago." Does admit to having a percocet on the Friday before admission.  Initially states that he never tried suboxone because he does not want to "trade one for another." However, later on he states that he tried "one strip" of suboxone before but it did not help him with his pain. Discussed with him that suboxone can be started in the hospital and continued outpatient once discharged to help with opioid withdrawal and pain. However, he states that he is not interested in starting it right now.  Reached out to patient's father after obtaining patient's consent. His father states that he last saw the patient last Monday or Tuesday. When he went to go mow the patient's lawn on Wednesday, he was not at home. However, he did get a call from the patient on Wednesday night and "everything seemed fine." Father reports that he tried calling the patient on Thursday, Friday, Saturday, and Sunday but the patient's phone went straight to voicemail every time. Father states that the patient "has had problems in the past with using drugs" and that "things haven't been good over the years." Father also reports that patient has history of cocaine use, used to be on "some type of pills", and has a history IV drugs "maybe heroin."  Discussed conversation with patient and patient reports that he was a little confused around the time of the incident so maybe he "was at a friend's house" instead. Also states that he has been feeling a lot better after getting pain meds.   Objective:  Vital signs in last 24  hours: Vitals:   05/01/20 2345 05/02/20 0014 05/02/20 0445 05/02/20 0916  BP: 120/75 118/78 124/73 134/81  Pulse: 83 82 85 67  Resp: 19 11  20   Temp:  98.5 F (36.9 C) 98.9 F (37.2 C) 98.1 F (36.7 C)  TempSrc:  Oral Oral Oral  SpO2: 99% 100% 100% 100%  Weight:  65.3 kg    Height:  6\' 2"  (1.88 m)     Physical Exam: Constitutional: Patient is lying in bed. He is ill-appearing, but does not seem to be in any acute distress.  HENT: Unable to evaluate due to pain. Minimal movement of head and neck during encounter. No overt injuries noted.  Cardiovascular: Normal rate and regular rhythm. Normal heart sounds with no murmurs, rubs, or gallops noted.  Pulmonary: Good air entry bilaterally. No use of accessory muscles. No crackles, wheezing, or rhonchi noted.   Assessment/Plan:  Active Problems:   Chronic hepatitis C without hepatic coma (HCC)   Neck pain   IVDU (intravenous drug user)   Laceration of foot   Elevated liver enzymes   Elevated troponin   1. Suspected Drug Overdose: Appears that patient is not being honest with about his drug use. He is unable to provide a history of events leading up to hospitalization. All he knows is that he passed out for an unknown amount of time. Says he was found down by his dad, however dad has not seen him in over a  week. UDS (+) for polysubstance (opioids, cocaine, and THC). Work up is concerning for rhabdomyolysis with elevated CK. He also has an NSTEMI with elevated high sensitivity troponins which peaked at 295. EKG is unremarkable, but will continue telemetry for now. Obtaining echocardiogram for completion of syncope work up.    2. Rhabdomyolysis: CK elevated at 8,037. Renal function is normal, no hematuria seen on UA. Received 1L NS bolus on admission and is currently on maintenance @ 200 cc/hr. Will repeat CK and trend if still increasing. Follow renal function, strict I&Os. Suspect his neck pain is 2/2 to rhabdomyolysis, his cervical  spine imaging with CT and MRI showed no evidence of acute infection or spinal cord impingement.   3. NSTEMI: Troponins flat 295 -> 264 -> 274 -> 208. Suspect type II NSTEMI in the setting of possible drug overdose. EKG is non ischemic. We are obtaining echocardiogram. Continue telemetry. Hold off on cardiology consult for now.   4. Transaminitis, Chronic Hep C: LFTs appear chronically elevated, however will trend in the setting of possible hypoperfusion event. Needs outpatient follow up for management of his Hep C.  5. Severe OUD, IVDU, Pain management: Patient is not interested in starting suboxone treatment. He does not appear to be in withdrawal, has been receiving IV dilaudid. Will continue this for now.   6. Foot laceration: Does not appear to be infected. There is evidence of a retained foreign body on plain films. May need podiatry consult but will wait until he is clinically stable.   Prior to Admission Living Arrangement: Home Anticipated Discharge Location: TBD Barriers to Discharge: continue medical management Dispo: Anticipated discharge in approximately 3-4 days.   Merrilyn Puma, MD 05/02/2020, 10:35 AM Pager: [phone] After 5pm on weekdays and 1pm on weekends: On Call pager [phone]

## 2020-05-02 NOTE — Progress Notes (Signed)
Internal Medicine Attending Note:  Date: 05/02/2020  Patient name: Michael Lester  Medical record number: [id number]  Date of birth: [birth year]   I have seen and evaluated Michael Lester and discussed their care with the Residency Team.   In brief, patient is a 34 yo M with a pmhx of severe opioid use disorder, IVDU, chronic hepatitis C, and recent hospitalization for C5-C7 osteomyelitis, discitis, and epidural abscess requiring anterior cervical discectomy with interbody fusion on 12/22/19 who presented with severe neck pain and LOC. Unfortunately patient is unable to provide a clear history of events leading up to his hospitalization. He says he was walking and stepped on a piece of glass then passed out. He does not know how long he was out for. He says he woke up at his dad's house and his dad found him. Our team was given permission to call him, unfortunately his dad was unaware he was in the hospital and says he has not seem him since last Monday (one week ago). The patient adamantly denies recent drug use. Says he has been clean for 2 weeks now aside from taking a percocet on Friday, however his drug screen was positive for opiates, cocaine, and THC.   PMHx, Fam Hx, and/or Soc Hx : Per resident H&P  Vitals:   05/02/20 0445 05/02/20 0916  BP: 124/73 134/81  Pulse: 85 67  Resp:  20  Temp: 98.9 F (37.2 C) 98.1 F (36.7 C)  SpO2: 100% 100%   Physical Exam Constitutional: NAD, appears comfortable Cardiovascular: RRR, no murmurs, rubs, or gallops.  Pulmonary/Chest: CTAB, no wheezes, rales, or rhonchi.  Abdominal: Soft, non tender, non distended. +BS.  Extremities: Warm and well perfused.No edema.  Psychiatric: Flat affect, does not want to participate in conversation or physical exam.    Assessment and Plan: I have seen and evaluated the patient as outlined above. I agree with the formulated Assessment and Plan as detailed in the residents' note, with the following changes:   1.  Suspected Drug Overdose: Unfortunately I do not think patient is being honest with [place] about his drug use. He is unable to provide a history of events leading up to hospitalization. All he knows is that he passed out for an unknown amount of time. Says he was found down by his dad, however dad has not seen him in over a week. UDS (+) for polysubstance (opioids, cocaine, and THC). Work up is concerning for rhabdomyolysis with elevated CK. He also has an NSTEMI with elevated high sensitivity troponins which peaked at 295. EKG is unremarkable, but will continue telemetry for now. Obtaining echocardiogram for completion of syncope work up.    2. Rhabdomyolysis: CK elevated at 8,037. Renal function is normal, no hematuria seen on UA. Received 1L NS bolus on admission and is currently on maintenance @ 200 cc/hr. Will repeat CK and trend if still increasing. Follow renal function, strict I&Os. Suspect his neck pain is 2/2 to rhabdomyolysis, his cervical spine imaging with CT and MRI showed no evidence of acute infection or spinal cord impingement.   3. NSTEMI: Troponins flat 295 -> 264 -> 274 -> 208. I suspect type II NSTEMI in the setting of possible drug overdose. EKG is non ischemic. We are obtaining echocardiogram. Continue telemetry. Hold off on cardiology consult for now.   4. Transaminitis, Chronic Hep C: LFTs appear chronically elevated, however will trend in the setting of possible hypoperfusion event. Needs outpatient follow up for management of his Hep  C.  5. Severe OUD, IVDU, Pain management: Patient is not interested in starting suboxone treatment. He does not appear to be in withdrawal, has been receiving IV dilaudid. Will continue this for now.   6. Foot laceration: Does not appear to be infected. There is evidence of a retained foreign body on plain films. May need podiatry consult but will wait until he is clinically stable.   Reymundo Poll, MD 7/5/202111:40 AM

## 2020-05-02 NOTE — Progress Notes (Signed)
  Echocardiogram 2D Echocardiogram has been performed.  Gerda Diss 05/02/2020, 1:33 PM

## 2020-05-02 NOTE — Progress Notes (Addendum)
Pt vomited po potassium. During vomiting part of pt iv pain meds were wasted. Pt given bath. Bed linens changed. Pt in bed in no apparent distress attempting to sleep. Called on call for advice. New orders given. Will continue to monitor.

## 2020-05-03 ENCOUNTER — Inpatient Hospital Stay (HOSPITAL_COMMUNITY): Payer: Self-pay

## 2020-05-03 ENCOUNTER — Telehealth: Payer: Self-pay | Admitting: Podiatry

## 2020-05-03 LAB — BASIC METABOLIC PANEL
Anion gap: 7 (ref 5–15)
BUN: 8 mg/dL (ref 6–20)
CO2: 21 mmol/L — ABNORMAL LOW (ref 22–32)
Calcium: 8.5 mg/dL — ABNORMAL LOW (ref 8.9–10.3)
Chloride: 110 mmol/L (ref 98–111)
Creatinine, Ser: 0.99 mg/dL (ref 0.61–1.24)
GFR calc Af Amer: >60 mL/min (ref >60)
GFR calc non Af Amer: >60 mL/min (ref >60)
Glucose, Bld: 97 mg/dL (ref 70–99)
Potassium: 3.8 mmol/L (ref 3.5–5.1)
Sodium: 138 mmol/L (ref 135–145)

## 2020-05-03 MED ORDER — OXYCODONE HCL 5 MG PO TABS
10.0000 mg | ORAL_TABLET | ORAL | Status: DC | PRN
  Administered 2020-05-03 – 2020-05-04 (×6): 10 mg via ORAL
  Filled 2020-05-03 (×6): qty 2

## 2020-05-03 MED ORDER — HYDROMORPHONE HCL 1 MG/ML IJ SOLN
0.5000 mg | INTRAMUSCULAR | Status: DC | PRN
  Administered 2020-05-03: 0.5 mg via INTRAVENOUS
  Filled 2020-05-03: qty 1

## 2020-05-03 NOTE — Telephone Encounter (Signed)
Dr Austin Miles called from [facility] requesting a consult from our office. Pt has foreign body in left foot.

## 2020-05-03 NOTE — Progress Notes (Addendum)
Subjective: Examined patient in chair.  Reports feeling better today. States that he still has some pain in his neck but it has improved since admission. He is able to turn his head left and right. Light palpation along cervical spine does not elicit tenderness. Palpation of surrounding paravertebral muscles does cause mild discomfort.  Discussed with patient that his labs (CK and troponins) are trending in the right direction and that his cardiac imaging/ECHO is normal. Informed him that we will be transitioning him from IV dilaudid to PO oxycodone in order to get him ready for discharge. Also informed him that podiatry will come to see him to evaluate the retained foreign body in his left foot and that if they deem it fine to manage it as outpatient then he can be discharged later on today. Patient confirms understanding of plan and states that he is ready to go home.   Addendum: Repeat EKG from earlier this morning showed a prolonged QTc interval, so suspecting possible cardiogenic etiology for syncope. In the past, the patient's syncopal episodes have always been attributed to drug overdose so want to make sure that we are not overlooking anything. Spoke with patient later in the afternoon to clarify his syncopal episodes. He states that he does not remember anything about his prior episodes but he has "had a few of them." As per patient, there was one time where he stood up very fast, became dizzy and "saw stars," and then fainted. Another time where he was at the "top of the stairs and then woke up at the bottom of the stairs" and it took him 30-60 minutes to get up from the floor. Says he did not come to hospital because he thought it was a "fluke." Says it is never the same feeling. Waiting on a repeat EKG to reevaluate patient's QT interval. Likely will have to follow cardiology as an outpatient.  Objective:  Vital signs in last 24 hours: Vitals:   05/02/20 2310 05/03/20 0332 05/03/20 0500  05/03/20 0821  BP: 128/85 140/88  133/81  Pulse: 84 77  91  Resp: 17 20  18   Temp: 99.3 F (37.4 C) 99.6 F (37.6 C)  98.4 F (36.9 C)  TempSrc: Oral Oral  Oral  SpO2: 100% 99%  100%  Weight:   71.4 kg   Height:       Physical Exam Constitutional:      Appearance: Normal appearance.     Comments: Patient sitting relatively comfortably in chair, in NAD.  HENT:     Head: Normocephalic and atraumatic.  Neck:     Comments: ROM appears to be fine and does not elicit tenderness. Light palpation along cervical spine does not elicit tenderness. Palpation along cervical paraspinal muscles causes mild discomfort. Cardiovascular:     Rate and Rhythm: Normal rate and regular rhythm.     Heart sounds: Normal heart sounds. No murmur heard.  No friction rub. No gallop.   Pulmonary:     Effort: Pulmonary effort is normal. No respiratory distress.     Breath sounds: Normal breath sounds. No stridor. No wheezing or rhonchi.  Neurological:     General: No focal deficit present.     Mental Status: Mental status is at baseline.     Assessment/Plan:  Principal Problem:   Non-traumatic rhabdomyolysis Active Problems:   Chronic hepatitis C without hepatic coma (HCC)   Neck pain   IVDU (intravenous drug user)   Laceration of foot   Elevated liver  enzymes   Elevated troponin   Accidental drug overdose   NSTEMI (non-ST elevated myocardial infarction) [facility])  Mr. Turko is a 34 year old Male with history of severe OUD, IVDU, chronic untreated Hep C, and recent hospitalization for C5-C7 osteomyelitis, discitis, and epidural abscess s/p anterior cervical discectomy w/interbody fusion on 12/22/19 who present with severe neck pain and LOC likely 2/2 a suspected drug overdose.  1.Suspected Drug Overdose: Appears that patient is not being honest with [place] about his drug use. He is unable to provide a history of events leading up to hospitalization. All he knows is that he passed out for an unknown  amount of time. Says he was found down by his dad, however dad has not seen him in over a week. UDS (+) for polysubstance (opioids, cocaine, and THC). He has an NSTEMI with elevated high sensitivity troponins which peaked at 295, but have now trended down. EKG and ECHO are unremarkable, tele was stopped today.  2. Rhabdomyolysis: CK trended down from 8,037 --> 4501. Renal function is normal, no hematuria seen on UA. Received 1L NS bolus on admission and then was on maintenance@ 200 cc/hr. Rhabdomyolysis is resolving so no longer trending CK and discontinued fluids.   3. NSTEMI:Troponins flat 295 ->264 ->274 ->208. Suspect type II NSTEMI in the setting of possible drug overdose. EKG is non ischemic and ECHO is normal. Discontinued telemetry. Hold off on cardiology consult for now.   4. Transaminitis, Chronic Hep C:LFTs appear chronically elevated, however will trend in the setting of possible hypoperfusion event. Needs outpatient follow up for management of his Hep C.  5. Severe OUD, IVDU, Pain management: Patient is not interested in starting suboxone treatment. He does not appear to be in withdrawal, was receiving IV dilaudid. Patient's pain has improved so pain management is now with PO oxycodone 10mg  for moderate and severe pain, and IV dilaudid 0.5mg  for breakthrough pain.  6. Foot laceration:Does not appear to be infected. There is evidence of a retained foreign body on plain films. Podiatry consulted, appreciate recommendations.   Prior to Admission Living Arrangement: Home Anticipated Discharge Location: Home Barriers to Discharge: Awaiting podiatry consult for retained foreign body. Dispo: Anticipated discharge in approximately 0-1 day(s).   , MD 05/03/2020, 10:30 AM Pager: [phone] After 5pm on weekdays and 1pm on weekends: On Call pager [phone]

## 2020-05-03 NOTE — Telephone Encounter (Signed)
I will see him 

## 2020-05-04 ENCOUNTER — Telehealth: Payer: Self-pay | Admitting: Podiatry

## 2020-05-04 DIAGNOSIS — M795 Residual foreign body in soft tissue: Secondary | ICD-10-CM

## 2020-05-04 MED ORDER — OXYCODONE HCL 10 MG PO TABS: 10.0000 mg | ORAL_TABLET | ORAL | 0 refills | Status: AC | PRN

## 2020-05-04 MED FILL — oxyCODONE HCL 10 MG TABS: 10 | 3 days supply | Qty: 18 | Fill #0

## 2020-05-04 NOTE — Discharge Summary (Signed)
Name: Michael Lester MRN: [id number] DOB: [birth year] 34 y.o. PCP: Patient, [name]  Date of Admission: 05/01/2020  3:53 PM Date of Discharge: 05/04/20 Attending Physician: Velna Ochs, MD  Discharge Diagnosis: 1. Suspected Drug Overdose  2. Mild Rhabdomyolysis  3. NSTEMI  4. Transaminitis likely 2/2 chronic Hep C  5. Severe OUD, IVDU, Pain management  6. Foot laceration  Discharge Medications: Allergies as of 05/04/2020      Reactions   Shellfish Allergy Anaphylaxis      Medication List    TAKE these medications   gabapentin 100 MG capsule Commonly known as: NEURONTIN Take 1 capsule (100 mg total) by mouth 3 (three) times daily.   nicotine 14 mg/24hr patch Commonly known as: NICODERM CQ - dosed in mg/24 hours Place 1 patch (14 mg total) onto the skin daily.   Oxycodone HCl 10 MG Tabs Take 1 tablet (10 mg total) by mouth every 4 (four) hours as needed for up to 3 days for moderate pain or severe pain.            Discharge Care Instructions  (From admission, onward)         Start     Ordered   05/04/20 0000  Discharge wound care:       Comments: The podiatrist, Dr. Posey Pronto, recommends that you put triple antibiotic on your foot wound and then place a band-aid on top of it to just keep the wound covered. He will see you in one week for further management/treatment.   05/04/20 1441          Disposition and follow-up:   Michael Lester was discharged from [facility] in Stable condition.  At the hospital follow up visit please address:  1. Suspected Drug Overdose. Appears that patient not being honest with [place] about drug use. Unable to provide history of events leading to hospitalization, just that he lost consciousness for an unknown period of time. Reports being found by father, but father states has not seen him in over a week. UDS (+) for polysubstance use (opiods, cocaine, THC). Has an NSTEMI with elevated high sensitivity trops which  peaked at 295, but since trended down. EKG and ECHO unremarkable.  2. Mild Rhabdomyolysis. Initial CK 8,037. Renal function normal, no hematuria seen on UA. Received 1L NS bolus on admission and then maintenance at 200 cc/hr. Repeat CK 4501. Tolerated PO fluids so d/c'd fluids.  3. NSTEMI. Troponins flat 295 initially, then 264-->274-->208. Suspect type II NSTEMI in the setting of possible drug overdose. In ED, EKG nonischemic and ECHO normal. EKG on 05/03/20 showed QT prolongation, however repeat EKG on 05/04/20 and telemetry are unremarkable.   4. Transaminitis likely 2/2 chronic Hep C. LFTs appear chronically elevated. Remained stable throughout admission. Requires outpatient follow up for management of Hep C.  5. Severe OUD, IVDU, Pain management. Given IV dilaudid for reported neck pain due to history of cervical surgery and severe cervical pain on exam. Initially refused suboxone therapy. Transitioned patient from IV dilaudid to PO oxycodone 64m with IV dilaudid for breakthrough pain. D/C'd with 3 day supply of PO oxycodone 165m  6. Foot laceration.  Did not appear infected. Evidence of retained foreign body on plain films. Podiatry consulted, will manage as outpatient in one week.  7.  Labs / imaging needed at time of follow-up: CBC, CMP  8.  Pending labs/ test needing follow-up: none  Follow-up Appointments: Podiatry and f/u for management of Hep  C   Hospital Course by problem list: 1. Suspected Drug Overdose. Appears that patient not being honest with [place] about drug use. Unable to provide history of events leading to hospitalization, just that he lost consciousness for an unknown period of time. Reports being found by father. With patient's consent, spoke to father, but father states has not seen him for a week. UDS (+) for polysubstance use (opiods, cocaine, THC). Initial workup done for suspected osteomyelitis vs soft tissue infection due to swelling, tenderness on cervical spine, history  of epidural abscess, and history of IVDU. Patient afebrile, WBC 5. CXR negative. UA negative. Blood cultures pending. Pain control initiated with IV dilaudid. Neuro checks q4h. CT angio to rule out PE was negative. CT head/cervical spine and MRI cervical spine all negative. Has an NSTEMI with elevated high sensitivity trops which peaked at 295, but since trended down. EKG and ECHO unremarkable, ruled out cardiogenic causes for LOC/syncope. Telemetry unremarkable. Glucose wnl, ruled out hypoglycemia. Patient denied prodromal symptoms, dizziness, incontinence, tongue biting. Seizure and orthostatic hypotension ruled out. Patient initially given IV dilaudid for severe neck pain. Throughout course of admission, slowly transitioned to PO oxycodone for pain management.  2. Mild Rhabdomyolysis. Initial CK 8,037. CMP showed normal renal function, no hematuria seen on UA. Received 1L NS bolus on admission and then maintenance at 200 cc/hr. Repeat CK 4501. CK downtrending so stopped monitoring. Tolerated PO fluids so d/c'd IV fluids.  3. NSTEMI. Troponins flat 295 initially, then 264-->274-->208. Suspect type II NSTEMI in the setting of possible drug overdose. In ED, EKG nonischemic and ECHO normal. Throughout course of admission, troponins downtrending so stopped monitoring. EKG on 05/03/20 showed QT prolongation, however repeat EKG on 05/04/20 and telemetry are unremarkable.   4. Transaminitis likely 2/2 chronic untreated Hep C. LFTs appear chronically elevated. Patient was supposed to f/u in ID outpatient clinic after prior hospitalization but was unable to make it. Remained stable throughout admission. Requires outpatient follow up for management of Hep C.  5. Severe OUD, IVDU, Pain management. Given IV dilaudid for reported neck pain due to history of cervical surgery and severe cervical pain on exam. Initially refused suboxone therapy. Did not appear to be in withdrawal. Transitioned patient from IV dilaudid to PO  oxycodone 65m with IV dilaudid for breakthrough pain. Revisited topic of suboxone therapy. Patient stated he [place] Encouraged patient to set up appointment before leaving the hospital. D/C'd with 3 day supply of PO oxycodone 164m   6. Foot laceration.  Does not appear to be infected. Evidence of retained foreign body on plain films. Podiatry consulted, will manage as outpatient in one week.  Discharge Vitals:   BP 124/78 (BP Location: Right Arm)   Pulse 85   Temp 98.2 F (36.8 C) (Oral)   Resp 18   Ht 6' 2" (1.88 m)   Wt 71.9 kg   SpO2 100%   BMI 20.36 kg/m   Pertinent Labs, Studies, and Procedures:  CBC Latest Ref Rng & Units 05/02/2020 05/01/2020 01/31/2020  WBC 4.0 - 10.5 K/uL 5.3 5.0 4.0  Hemoglobin 13.0 - 17.0 g/dL 12.0(L) 12.0(L) 12.1(L)  Hematocrit 39 - 52 % 36.8(L) 37.2(L) 37.5(L)  Platelets 150 - 400 K/uL 225 243 129(L)   BMP Latest Ref Rng & Units 05/03/2020 05/02/2020 05/02/2020  Glucose 70 - 99 mg/dL 97 115(H) 150(H)  BUN 6 - 20 mg/dL _0 Creatinine 0.61 -  1.24 mg/dL 0.99 0.95 1.06  Sodium 135 - 145 mmol/L 138 137 134(L)  Potassium 3.5 - 5.1 mmol/L 3.8 3.4(L) 2.8(L)  Chloride 98 - 111 mmol/L 110 107 104  CO2 22 - 32 mmol/L 21(L) 22 21(L)  Calcium 8.9 - 10.3 mg/dL 8.5(L) 8.2(L) 8.7(L)   CMP Latest Ref Rng & Units 05/03/2020 05/02/2020 05/02/2020  Glucose 70 - 99 mg/dL 97 115(H) 150(H)  BUN 6 - 20 mg/dL _0 Creatinine 0.61 - 1.24 mg/dL 0.99 0.95 1.06  Sodium 135 - 145 mmol/L 138 137 134(L)  Potassium 3.5 - 5.1 mmol/L 3.8 3.4(L) 2.8(L)  Chloride 98 - 111 mmol/L 110 107 104  CO2 22 - 32 mmol/L 21(L) 22 21(L)  Calcium 8.9 - 10.3 mg/dL 8.5(L) 8.2(L) 8.7(L)  Total Protein 6.5 - 8.1 g/dL - - 6.8  Total Bilirubin 0.3 - 1.2 mg/dL - - 1.5(H)  Alkaline Phos 38 - 126 U/L - - 107  AST 15 - 41 U/L - - 451(H)  ALT 0 - 44 U/L - - 152(H)   Hepatic Function Latest Ref Rng & Units 05/02/2020 02/02/2020 01/31/2020  Total  Protein 6.5 - 8.1 g/dL 6.8 6.1(L) 5.8(L)  Albumin 3.5 - 5.0 g/dL 3.6 3.5 3.3(L)  AST 15 - 41 U/L 451(H) 92(H) 100(H)  ALT 0 - 44 U/L 152(H) 312(H) 325(H)  Alk Phosphatase 38 - 126 U/L 107 100 98  Total Bilirubin 0.3 - 1.2 mg/dL 1.5(H) 0.4 0.3  Bilirubin, Direct 0.0 - 0.2 mg/dL - - -   Urinalysis    Component Value Date/Time   COLORURINE YELLOW 05/01/2020 2205   APPEARANCEUR CLOUDY (A) 05/01/2020 2205   LABSPEC 1.026 05/01/2020 2205   PHURINE 6.0 05/01/2020 2205   GLUCOSEU 150 (A) 05/01/2020 2205   HGBUR NEGATIVE 05/01/2020 2205   BILIRUBINUR NEGATIVE 05/01/2020 2205   KETONESUR NEGATIVE 05/01/2020 2205   PROTEINUR 30 (A) 05/01/2020 2205   NITRITE NEGATIVE 05/01/2020 2205   LEUKOCYTESUR NEGATIVE 05/01/2020 2205    Drugs of Abuse     Component Value Date/Time   LABOPIA POSITIVE (A) 05/01/2020 2205   COCAINSCRNUR POSITIVE (A) 05/01/2020 2205   LABBENZ NONE DETECTED 05/01/2020 2205   AMPHETMU NONE DETECTED 05/01/2020 2205   THCU POSITIVE (A) 05/01/2020 2205   LABBARB NONE DETECTED 05/01/2020 2205     CK 8,037 --> 4501 Trops flat 295 --> 264 --> 274 --> 208   Imaging:  PORTABLE CHEST 1 VIEW IMPRESSION: 1. No acute cardiopulmonary process. 2. Apparent slight shift of the mediastinum to the right of the midline may be artifactual and related to patient's position. Clinical correlation is recommended.  LEFT FOOT - 2 VIEW IMPRESSION: 1. No acute osseous pathology. 2. A 5 mm radiopaque foreign object in the plantar superficial soft tissues of the hindfoot.  CT HEAD WITHOUT CONTRAST CT CERVICAL SPINE WITHOUT CONTRAST IMPRESSION: 1. No evidence for acute intracranial abnormality. 2. Interval anterior fusion of C5-C7 with interbody fusion at C5-6 and C6-7. 3. No evidence for acute cervical spine abnormality. 4. Emphysema (ICD10-J43.9).  CT ANGIOGRAPHY CHEST WITH CONTRAST IMPRESSION: 1. No evidence of pulmonary embolism. 2. Mild multifocal anterior bilateral upper  lobe infiltrates.  MRI CERVICAL SPINE WITHOUT AND WITH CONTRAST IMPRESSION: 1. No MRI evidence for acute infection within the cervical spine. 2. Prior ACDF at C5-C7 without residual spinal stenosis. Residual uncovertebral disease with persistent moderate right worse than left C6 and C7 foraminal stenosis. 3. Shallow central disc protrusion at C4-5 without stenosis or Impingement.  LEFT OS  CALCIS - 2+ VIEW IMPRESSION: 5 mm foreign body in the subcutaneous tissue along the posterior plantar aspect of the left foot in unchanged position. No underlying bony abnormality.    Discharge Instructions: Discharge Instructions    Call MD for:  difficulty breathing, headache or visual disturbances   Complete by: As directed    Call MD for:  extreme fatigue   Complete by: As directed    Call MD for:  hives   Complete by: As directed    Call MD for:  persistant dizziness or light-headedness   Complete by: As directed    Call MD for:  persistant nausea and vomiting   Complete by: As directed    Call MD for:  redness, tenderness, or signs of infection (pain, swelling, redness, odor or green/yellow discharge around incision site)   Complete by: As directed    Call MD for:  severe uncontrolled pain   Complete by: As directed    Call MD for:  temperature >100.4   Complete by: As directed    Discharge instructions   Complete by: As directed    Hi Mr. Jost, it was a pleasure taking care you during your time here.   As we discussed, you will be going home with a 3 day supply of oral oxycodone for your pain. Also, we talked about starting Suboxone therapy for you so I highly recommend that you contact one of the clinics that you were telling us about in order to begin Suboxone therapy and prevent a potential relapse.   Lastly, as we discussed, I recommend that you go to your follow up visit with the podiatrist, Dr. Posey Pronto, so that they can treat your foot wound and take out the piece of glass  that is stuck inside.   Discharge wound care:   Complete by: As directed    The podiatrist, Dr. Posey Pronto, recommends that you put triple antibiotic on your foot wound and then place a band-aid on top of it to just keep the wound covered. He will see you in one week for further management/treatment.      Signed: Virl Axe, MD 05/04/2020, 2:57 PM   Pager: [phone]

## 2020-05-04 NOTE — Telephone Encounter (Signed)
Called pt unable to leave message vm has not been set up

## 2020-05-04 NOTE — Progress Notes (Signed)
Discharge:   Discharge summary reviewed with patient including education and recommendations for podiatry follow-up.   Patient currently waiting to be picked up by family/friend.

## 2020-05-04 NOTE — Consult Note (Signed)
  Subjective:  Patient ID: Michael Lester, male    DOB: [birth year],  MRN: [id number]   A 34 y.o. male with past medical history of with a pmhx of severe opioid use disorder, IVDU, chronic hepatitis C, and recent hospitalization for C5-C7 osteomyelitis, discitis, and epidural abscess requiring anterior cervical discectomy with interbody fusion on 12/22/19 who presented with concern for drug overdose with NSTEMI and rhabdomyolysis.  Patient also at the same time stepped on the piece of glass.  Patient states there were multiple pieces of glass and he stepped on them.  He does not recall how he stepped on them or when it happened.  He states that there is little bit of discomfort associated with me pressing on it.  He denies any acute pain to the left lower extremity.  He denies any nausea fever chills vomiting.  Objective:   Vitals:   05/04/20 0446 05/04/20 0617  BP: 135/89   Pulse: 69   Resp: 16 16  Temp: 98.7 F (37.1 C)   SpO2: 99%    General AA&O x3. Normal mood and affect.  Vascular Dorsalis pedis and posterior tibial pulses 2/4 bilat. Brisk capillary refill to all digits. Pedal hair present.  Neurologic Epicritic sensation grossly intact.  Dermatologic  superficial laceration noted to the left plantar foot.  Does not appear to be deep.  Does not correlate with the foreign body.  Small entry wound noted on the posterior hindfoot of the heel.  No clinical signs of infection noted.  No purulent drainage was expressed.  Mild pain on palpation.  No visualization of the foreign body/glass noted.  Orthopedic: MMT 5/5 in dorsiflexion, plantarflexion, inversion, and eversion. Normal joint ROM without pain or crepitus.   Radiographs:5 mm foreign body in the subcutaneous tissue along the posterior plantar aspect of the left foot in unchanged position. No underlying bony abnormality.  Assessment & Plan:  Patient was evaluated and treated and all questions answered.  Left foot foreign body (glass)  without any clinical signs of infection -I explained to the patient the the pros and cons of removing a foreign body while in the hospital.  Clinically I am not concerned about any signs of infection especially at the entry site.  However it appears to be buried within the subcutaneous tissue.  Patient has been able to ambulate without any issues.  I discussed this with the patient that given that he is able to ambulate without much discomfort, I feel comfortable doing this as an outpatient as opposed to inpatient. -Given that patient can ambulate without issues I believe this could be removed in an outpatient setting if it continues to bother him.  At this time, I can clinically monitor him and he can follow-up with me 1 week after to being discharged. -At this time I am not clinically concerned for any kind of infection associated with foreign body.   -Weightbearing as tolerated to the left lower extremity -I will see him in clinic 1 week after being discharged. Candelaria Stagers, DPM  Accessible via secure chat for questions or concerns.

## 2020-05-04 NOTE — Progress Notes (Signed)
Subjective: Patient reports that the pain has been doing okay.   We discussed the plan for discharge today. He will be able to follow up with podiatry outpatient. We discussed his EKG findings and that they have improved. We discussed starting suboxone here if he was interested, we could get it started before he left. He reported that he has a few places in [place] that he may be able to go to for Suboxone. We discussed concerns about relapsing. He reported that he still would like to go home. Advised to contact clinic to try and get an appointment today.   Objective:  Vital signs in last 24 hours: Vitals:   05/04/20 0446 05/04/20 0545 05/04/20 0617 05/04/20 0802  BP: 135/89   124/78  Pulse: 69   85  Resp: 16  16 18   Temp: 98.7 F (37.1 C)   98.2 F (36.8 C)  TempSrc: Oral   Oral  SpO2: 99%   100%  Weight:  71.9 kg    Height:       Physical Exam Constitutional:      Appearance: Normal appearance.     Comments: Patient laying comfortably in bed, in NAD.  HENT:     Head: Normocephalic and atraumatic.  Neck:     Comments: ROM is fine and does not cause tenderness. Light palpation along cervical spine does not elicit tenderness. Cardiovascular:     Rate and Rhythm: Normal rate and regular rhythm.     Heart sounds: Normal heart sounds. No murmur heard.  No friction rub. No gallop.   Pulmonary:     Effort: Pulmonary effort is normal. No respiratory distress.     Breath sounds: Normal breath sounds. No stridor. No wheezing or rhonchi.  Neurological:     General: No focal deficit present.     Mental Status: Mental status is at baseline. Extremities:    No edema. Wound on left foot appears to be clean. No significant tenderness noted with palpation around wound edges.  Assessment/Plan:  Principal Problem:   Non-traumatic rhabdomyolysis Active Problems:   Chronic hepatitis C without hepatic coma (HCC)   Neck pain   IVDU (intravenous drug user)   Laceration of foot    Elevated liver enzymes   Elevated troponin   Accidental drug overdose   NSTEMI (non-ST elevated myocardial infarction) [facility])  Michael Lester is a 34 year old Male with history of severe OUD, IVDU, chronic untreated Hep C, and recent hospitalization for C5-C7 osteomyelitis, discitis, and epidural abscess s/p anterior cervical discectomy w/interbody fusion on 12/22/19 who present with severe neck pain and LOC likely 2/2 a suspected drug overdose.  1.Suspected Drug Overdose:Appears thatpatient isnotbeing honest with 12/24/19 about his drug use. He is unable to provide a history of events leading up to hospitalization. All he knows is that he passed out for an unknown amount of time. Says he was found down by his dad, however dad has not seen him in over a week. UDS (+) for polysubstance (opioids, cocaine, and THC). He has an NSTEMI with elevated high sensitivity troponins which peaked at 295, but have now trended down. EKG and ECHO are unremarkable.  2. Rhabdomyolysis: CK trended down from 8,037 --> 4501. Renal function is normal, no hematuria seen on UA. Received 1L NS bolus on admission and then was on maintenance@ 200 cc/hr. Rhabdomyolysis is resolving so no longer trending CK and discontinued fluids.   3. NSTEMI:Troponins flat 295 ->264 ->274 ->208.Suspect type II NSTEMI in the setting of possible  drug overdose. EKG is non ischemic and ECHO is normal. EKG showed QT prolongation yesterday. However, repeat ECG and telemetry (which was restarted yesterday) do not show any QT prolongation. Telemetry has been discontinued as patient will be discharged today.  4. Transaminitis, Chronic Hep C:LFTs appear chronically elevated, however will trend in the setting of possible hypoperfusion event. Needs outpatient follow up for management of his Hep C.  5. Severe OUD, IVDU, Pain management:  Patient mentions interest in starting suboxone therapy but states that he will go to a clinic closer to home. Last  dose of IV dilaudid 0.5mg  for breakthrough pain was received yesterday at 1pm. IV dilaudid was discontinued today. Will send patient home with 3 day supply of PO oxycodone 10mg .  6. Foot laceration:Does not appear to be infected. There is evidence of a retained foreign body on plain films. Podiatry, Dr. , informed Allena Katz that patient's foot laceration can be managed as an outpatient and that he will follow up with patient in one week.   Prior to Admission Living Arrangement: Home Anticipated Discharge Location: Home Barriers to Discharge: NONE Dispo: Anticipated discharge today.   [place], MD 05/04/2020, 10:39 AM Pager: [phone] After 5pm on weekdays and 1pm on weekends: On Call pager [phone]

## 2020-05-06 LAB — CULTURE, BLOOD (ROUTINE X 2): Culture: NO GROWTH

## 2020-05-07 LAB — CULTURE, BLOOD (ROUTINE X 2)
Culture: NO GROWTH
Special Requests: ADEQUATE

## 2020-05-11 ENCOUNTER — Encounter: Payer: Self-pay | Admitting: Emergency Medicine

## 2020-05-11 ENCOUNTER — Emergency Department: Payer: Self-pay

## 2020-05-11 ENCOUNTER — Emergency Department
Admission: EM | Admit: 2020-05-11 | Discharge: 2020-05-11 | Disposition: A | Discharge status: Home / Self Care | Payer: Self-pay | Attending: Student in an Organized Health Care Education/Training Program | Admitting: Student in an Organized Health Care Education/Training Program

## 2020-05-11 ENCOUNTER — Other Ambulatory Visit: Payer: Self-pay

## 2020-05-11 DIAGNOSIS — F17200 Nicotine dependence, unspecified, uncomplicated: Secondary | ICD-10-CM | POA: Insufficient documentation

## 2020-05-11 DIAGNOSIS — T401X1A Poisoning by heroin, accidental (unintentional), initial encounter: Secondary | ICD-10-CM | POA: Insufficient documentation

## 2020-05-11 DIAGNOSIS — T50901A Poisoning by unspecified drugs, medicaments and biological substances, accidental (unintentional), initial encounter: Secondary | ICD-10-CM

## 2020-05-11 LAB — CBC WITH DIFFERENTIAL/PLATELET
Abs Immature Granulocytes: 0.04 10*3/uL (ref 0.00–0.07)
Basophils Absolute: 0 10*3/uL (ref 0.0–0.1)
Basophils Relative: 1 %
Eosinophils Absolute: 0.1 10*3/uL (ref 0.0–0.5)
Eosinophils Relative: 2 %
HCT: 37.9 % — ABNORMAL LOW (ref 39.0–52.0)
Hemoglobin: 12.1 g/dL — ABNORMAL LOW (ref 13.0–17.0)
Immature Granulocytes: 1 %
Lymphocytes Relative: 19 %
Lymphs Abs: 1.5 10*3/uL (ref 0.7–4.0)
MCH: 25.8 pg — ABNORMAL LOW (ref 26.0–34.0)
MCHC: 31.9 g/dL (ref 30.0–36.0)
MCV: 80.8 fL (ref 80.0–100.0)
Monocytes Absolute: 0.4 10*3/uL (ref 0.1–1.0)
Monocytes Relative: 6 %
Neutro Abs: 5.9 10*3/uL (ref 1.7–7.7)
Neutrophils Relative %: 71 %
Platelets: 255 10*3/uL (ref 150–400)
RBC: 4.69 MIL/uL (ref 4.22–5.81)
RDW: 14.3 % (ref 11.5–15.5)
WBC: 8.1 10*3/uL (ref 4.0–10.5)
nRBC: 0 % (ref 0.0–0.2)

## 2020-05-11 LAB — COMPREHENSIVE METABOLIC PANEL
ALT: 28 U/L (ref 0–44)
AST: 21 U/L (ref 15–41)
Albumin: 4.1 g/dL (ref 3.5–5.0)
Alkaline Phosphatase: 119 U/L (ref 38–126)
Anion gap: 10 (ref 5–15)
BUN: 17 mg/dL (ref 6–20)
CO2: 25 mmol/L (ref 22–32)
Calcium: 9.2 mg/dL (ref 8.9–10.3)
Chloride: 101 mmol/L (ref 98–111)
Creatinine, Ser: 1.5 mg/dL — ABNORMAL HIGH (ref 0.61–1.24)
GFR calc Af Amer: >60 mL/min (ref >60)
GFR calc non Af Amer: 59 mL/min — ABNORMAL LOW (ref >60)
Glucose, Bld: 142 mg/dL — ABNORMAL HIGH (ref 70–99)
Potassium: 4.5 mmol/L (ref 3.5–5.1)
Sodium: 136 mmol/L (ref 135–145)
Total Bilirubin: 0.7 mg/dL (ref 0.3–1.2)
Total Protein: 7.4 g/dL (ref 6.5–8.1)

## 2020-05-11 LAB — LACTIC ACID, PLASMA: Lactic Acid, Venous: 1 mmol/L (ref 0.5–1.9)

## 2020-05-11 MED ORDER — SODIUM CHLORIDE 0.9 % IV BOLUS
500.0000 mL | Freq: Once | INTRAVENOUS | Status: AC
  Administered 2020-05-11: 500 mL via INTRAVENOUS

## 2020-05-11 MED ORDER — LACTATED RINGERS IV BOLUS
1000.0000 mL | Freq: Once | INTRAVENOUS | Status: AC
  Administered 2020-05-11: 1000 mL via INTRAVENOUS

## 2020-05-11 MED ORDER — NALOXONE HCL 0.4 MG/ML IJ SOLN
0.1000 mg | Freq: Once | INTRAMUSCULAR | Status: AC
  Administered 2020-05-11: 0.1 mg via INTRAVENOUS

## 2020-05-11 MED ORDER — ONDANSETRON HCL 4 MG/2ML IJ SOLN
INTRAMUSCULAR | Status: AC
  Administered 2020-05-11: 4 mg via INTRAVENOUS
  Filled 2020-05-11: qty 2

## 2020-05-11 MED ORDER — ONDANSETRON HCL 4 MG/2ML IJ SOLN: 4.0000 mg | Freq: Once | INTRAMUSCULAR | Status: AC

## 2020-05-11 MED ORDER — NALOXONE HCL 2 MG/2ML IJ SOSY
PREFILLED_SYRINGE | INTRAMUSCULAR | Status: AC
  Filled 2020-05-11: qty 2

## 2020-05-11 NOTE — Discharge Instructions (Addendum)
Follow up with [facility] and [facility].

## 2020-05-11 NOTE — ED Notes (Signed)
Pt given lunch tray and sprite at this time per EDP.

## 2020-05-11 NOTE — ED Triage Notes (Signed)
Pt arrival via [facility] due to heroin overdose. Patient was found by EMS in his car passed out on the side of a road. Patient states he shot up 0.1 of heroine this morning.   Ems states his blood pressure when they first got him was 151/100, but that shortly before arrival his systolic was in the 80's. Patient a&ox4 on arrival.   Pt was here a few days ago and was put on suboxom which patient states he hasn't been taking.

## 2020-05-11 NOTE — ED Notes (Signed)
Pressures elevated at this time post 500 cc bolus. Dr. Marisa Severin confirmed patient safe for d/c.

## 2020-05-11 NOTE — ED Provider Notes (Signed)
[facility] Emergency Department Provider Note    First MD Initiated Contact with Patient 05/11/20 1255     (approximate)  I have reviewed the triage vital signs and the nursing notes.   HISTORY  Chief Complaint Drug Overdose    HPI Michael Lester is a 34 y.o. male the extensive history as listed below presents to the ER for evaluation of overdose.  Patient does admit to using heroin today.  Did not receive any Narcan via EMS because mentating well.  Mild to the ER so becoming slightly more bradypneic with low blood pressure.  States he has not had anything to eat or drink today either.  Will check blood work.  Is also had multiple admissions to hospital over the past several months.  Denies any chest pain or pressure.   History reviewed. No pertinent past medical history. History reviewed. No pertinent family history. Past Surgical History:  Procedure Laterality Date  . ANTERIOR CERVICAL DECOMP/DISCECTOMY FUSION N/A 12/22/2019   Procedure: cervical five-six, cervical six-seven anterior cervical discectomy with interbody fusion;  Surgeon: Julio Sicks, MD;  Location: [facility] OR;  Service: Neurosurgery;  Laterality: N/A;  . LACERATION REPAIR N/A 11/16/2014   Procedure: REPAIR MULTIPLE LACERATIONS;  Surgeon: Melvenia Beam, MD;  Location: [facility] OR;  Service: ENT;  Laterality: N/A;  . LACERATION REPAIR Left 11/16/2014   Procedure: REPAIR MULTIPLE LACERATIONS;  Surgeon: Manus Rudd, MD;  Location: [facility] OR;  Service: General;  Laterality: Left;  irrigation and suturing of lacerations on upper body   Patient Active Problem List   Diagnosis Date Noted  . Foreign body (FB) in soft tissue   . IVDU (intravenous drug user) 05/02/2020  . Laceration of foot 05/02/2020  . Elevated liver enzymes 05/02/2020  . Elevated troponin 05/02/2020  . Accidental drug overdose   . Non-traumatic rhabdomyolysis   . NSTEMI (non-ST elevated myocardial infarction) (HCC)   . Neck pain 05/01/2020    . Hepatitis C antibody positive in blood 01/21/2020  . Severe sepsis (HCC) 12/30/2019  . Abscess in epidural space of cervical spine 12/21/2019  . Opioid dependence, daily use (HCC) 12/21/2019  . Chronic hepatitis C without hepatic coma (HCC) 12/21/2019  . Osteomyelitis of C6-7 12/20/2019  . Injection of illicit drug within last 12 months 12/20/2019  . Syncope   . Multiple lacerations 11/17/2014      Prior to Admission medications   Medication Sig Start Date End Date Taking? Authorizing Provider  gabapentin (NEURONTIN) 100 MG capsule Take 1 capsule (100 mg total) by mouth 3 (three) times daily. Patient not taking: Reported on 05/01/2020 02/02/20   Leatha Gilding, MD  nicotine (NICODERM CQ - DOSED IN MG/24 HOURS) 14 mg/24hr patch Place 1 patch (14 mg total) onto the skin daily. Patient not taking: Reported on 05/01/2020 02/02/20   Leatha Gilding, MD    Allergies Shellfish allergy    Social History Social History   Tobacco Use  . Smoking status: Current Every Day Smoker    Packs/day: 0.50  . Smokeless tobacco: Never Used  Vaping Use  . Vaping Use: Never used  Substance Use Topics  . Alcohol use: Not Currently  . Drug use: Yes    Types: Marijuana, Heroin, Cocaine    Comment: daily     Review of Systems Patient denies headaches, rhinorrhea, blurry vision, numbness, shortness of breath, chest pain, edema, cough, abdominal pain, nausea, vomiting, diarrhea, dysuria, fevers, rashes or hallucinations unless otherwise stated above in HPI. ____________________________________________  PHYSICAL EXAM:  VITAL SIGNS: Vitals:   05/11/20 1445 05/11/20 1449  BP: (!) 90/54   Pulse: 66 65  Resp: (!) 8 14  Temp:    SpO2: 97% 97%    Constitutional: drowsy and oriented.  Eyes: Conjunctivae are normal. 70mm bilat Head: Atraumatic. Nose: No congestion/rhinnorhea. Mouth/Throat: Mucous membranes are moist.   Neck: No stridor. Painless ROM.  Cardiovascular: Normal rate, regular  rhythm. Grossly normal heart sounds.  Good peripheral circulation. Respiratory: Normal respiratory effort.  No retractions. Lungs CTAB. Gastrointestinal: Soft and nontender. No distention. No abdominal bruits. No CVA tenderness. Genitourinary:  Musculoskeletal: No lower extremity tenderness nor edema.  No joint effusions. Neurologic:  Normal speech and language. No gross focal neurologic deficits are appreciated. No facial droop Skin:  Skin is warm, dry and intact. No rash noted. Psychiatric: Mood and affect are normal. Speech and behavior are normal.  ____________________________________________   LABS (all labs ordered are listed, but only abnormal results are displayed)  Results for orders placed or performed during the hospital encounter of 05/11/20 (from the past 24 hour(s))  CBC with Differential/Platelet     Status: Abnormal   Collection Time: 05/11/20  1:00 PM  Result Value Ref Range   WBC 8.1 4.0 - 10.5 K/uL   RBC 4.69 4.22 - 5.81 MIL/uL   Hemoglobin 12.1 (L) 13.0 - 17.0 g/dL   HCT 13.2 (L) 39 - 52 %   MCV 80.8 80.0 - 100.0 fL   MCH 25.8 (L) 26.0 - 34.0 pg   MCHC 31.9 30.0 - 36.0 g/dL   RDW 44.0 10.2 - 72.5 %   Platelets 255 150 - 400 K/uL   nRBC 0.0 0.0 - 0.2 %   Neutrophils Relative % 71 %   Neutro Abs 5.9 1.7 - 7.7 K/uL   Lymphocytes Relative 19 %   Lymphs Abs 1.5 0.7 - 4.0 K/uL   Monocytes Relative 6 %   Monocytes Absolute 0.4 0 - 1 K/uL   Eosinophils Relative 2 %   Eosinophils Absolute 0.1 0 - 0 K/uL   Basophils Relative 1 %   Basophils Absolute 0.0 0 - 0 K/uL   Immature Granulocytes 1 %   Abs Immature Granulocytes 0.04 0.00 - 0.07 K/uL  Comprehensive metabolic panel     Status: Abnormal   Collection Time: 05/11/20  1:00 PM  Result Value Ref Range   Sodium 136 135 - 145 mmol/L   Potassium 4.5 3.5 - 5.1 mmol/L   Chloride 101 98 - 111 mmol/L   CO2 25 22 - 32 mmol/L   Glucose, Bld 142 (H) 70 - 99 mg/dL   BUN 17 6 - 20 mg/dL   Creatinine, Ser 3.66 (H) 0.61 -  1.24 mg/dL   Calcium 9.2 8.9 - 44.0 mg/dL   Total Protein 7.4 6.5 - 8.1 g/dL   Albumin 4.1 3.5 - 5.0 g/dL   AST 21 15 - 41 U/L   ALT 28 0 - 44 U/L   Alkaline Phosphatase 119 38 - 126 U/L   Total Bilirubin 0.7 0.3 - 1.2 mg/dL   GFR calc non Af Amer 59 (L) >60 mL/min   GFR calc Af Amer >60 >60 mL/min   Anion gap 10 5 - 15  Lactic acid, plasma     Status: None   Collection Time: 05/11/20  1:21 PM  Result Value Ref Range   Lactic Acid, Venous 1.0 0.5 - 1.9 mmol/L   ____________________________________________  EKG My review and personal interpretation at Time:  12:44 Indication: od  Rate: 90  Rhythm: sinus Axis: normal Other: normalintervals, no stemi ____________________________________________  RADIOLOGY   ____________________________________________   PROCEDURES  Procedure(s) performed:  Procedures    Critical Care performed: no ____________________________________________   INITIAL IMPRESSION / ASSESSMENT AND PLAN / ED COURSE  Pertinent labs & imaging results that were available during my care of the patient were reviewed by me and considered in my medical decision making (see chart for details).   DDX: overdose, dehydration, sepsis, electrolyte abn  Michael Lester is a 34 y.o. who presents to the ED with admitted overdose on heroin that was recreational.  Denies any pain.  Patient was given small dose of Narcan due to low blood pressure but is mentating well.  Did improve blood pressure will give IV fluids.  Will check blood work.  Will observe in the ER.  Clinical Course as of May 11 1534  Wed May 11, 2020  1344 Blood pressure improved after given Narcan.  Receiving IV fluids.  Will observe.   [PR]  1432 Patient states he feels well and is requesting discharge home.  Informed him that we will observe him for 1 hour post Narcan.  He is tolerating p.o. eating and drinking.  Just one to make sure that he is not having rebound effect after the Narcan is worn off.   Patient agrees to plan.  Patient denies any SI or HI.  Denies any chest pain or shortness of breath.  Have discussed with the patient and available family all diagnostics and treatments performed thus far and all questions were answered to the best of my ability. The patient demonstrates understanding and agreement with plan.    [PR]    Clinical Course User Index [PR] Willy Eddy, MD    The patient was evaluated in Emergency Department today for the symptoms described in the history of present illness. He/she was evaluated in the context of the global COVID-19 pandemic, which necessitated consideration that the patient might be at risk for infection with the SARS-CoV-2 virus that causes COVID-19. Institutional protocols and algorithms that pertain to the evaluation of patients at risk for COVID-19 are in a state of rapid change based on information released by regulatory bodies including the CDC and federal and state organizations. These policies and algorithms were followed during the patient's care in the ED.  As part of my medical decision making, I reviewed the following data within the electronic MEDICAL RECORD NUMBER Nursing notes reviewed and incorporated, Labs reviewed, notes from prior ED visits and [place] Controlled Substance Database   ____________________________________________   FINAL CLINICAL IMPRESSION(S) / ED DIAGNOSES  Final diagnoses:  Accidental drug overdose, initial encounter      NEW MEDICATIONS STARTED DURING THIS VISIT:  New Prescriptions   No medications on file     Note:  This document was prepared using Dragon voice recognition software and may include unintentional dictation errors.    Willy Eddy, MD 05/11/20 1535

## 2020-06-30 ENCOUNTER — Other Ambulatory Visit: Payer: Self-pay

## 2020-06-30 ENCOUNTER — Emergency Department: Payer: Self-pay

## 2020-06-30 ENCOUNTER — Emergency Department
Admission: EM | Admit: 2020-06-30 | Discharge: 2020-06-30 | Disposition: A | Discharge status: Home / Self Care | Payer: Self-pay | Attending: Emergency Medicine | Admitting: Emergency Medicine

## 2020-06-30 DIAGNOSIS — Z5321 Procedure and treatment not carried out due to patient leaving prior to being seen by health care provider: Secondary | ICD-10-CM | POA: Insufficient documentation

## 2020-06-30 DIAGNOSIS — Z20822 Contact with and (suspected) exposure to covid-19: Secondary | ICD-10-CM | POA: Insufficient documentation

## 2020-06-30 DIAGNOSIS — T40411A Poisoning by fentanyl or fentanyl analogs, accidental (unintentional), initial encounter: Secondary | ICD-10-CM | POA: Insufficient documentation

## 2020-06-30 DIAGNOSIS — T401X1A Poisoning by heroin, accidental (unintentional), initial encounter: Secondary | ICD-10-CM | POA: Insufficient documentation

## 2020-06-30 DIAGNOSIS — Y636 Underdosing and nonadministration of necessary drug, medicament or biological substance: Secondary | ICD-10-CM | POA: Insufficient documentation

## 2020-06-30 LAB — COMPREHENSIVE METABOLIC PANEL
ALT: 1141 U/L — ABNORMAL HIGH (ref 0–44)
AST: 2458 U/L — ABNORMAL HIGH (ref 15–41)
Albumin: 4.7 g/dL (ref 3.5–5.0)
Alkaline Phosphatase: 141 U/L — ABNORMAL HIGH (ref 38–126)
Anion gap: 19 — ABNORMAL HIGH (ref 5–15)
BUN: 57 mg/dL — ABNORMAL HIGH (ref 6–20)
CO2: 17 mmol/L — ABNORMAL LOW (ref 22–32)
Calcium: 7.7 mg/dL — ABNORMAL LOW (ref 8.9–10.3)
Chloride: 101 mmol/L (ref 98–111)
Creatinine, Ser: 4.49 mg/dL — ABNORMAL HIGH (ref 0.61–1.24)
GFR calc Af Amer: 18 mL/min — ABNORMAL LOW (ref >60)
GFR calc non Af Amer: 16 mL/min — ABNORMAL LOW (ref >60)
Glucose, Bld: 110 mg/dL — ABNORMAL HIGH (ref 70–99)
Potassium: 6 mmol/L — ABNORMAL HIGH (ref 3.5–5.1)
Sodium: 137 mmol/L (ref 135–145)
Total Bilirubin: 2 mg/dL — ABNORMAL HIGH (ref 0.3–1.2)
Total Protein: 8.5 g/dL — ABNORMAL HIGH (ref 6.5–8.1)

## 2020-06-30 LAB — CBC
HCT: 32.2 % — ABNORMAL LOW (ref 39.0–52.0)
Hemoglobin: 11 g/dL — ABNORMAL LOW (ref 13.0–17.0)
MCH: 26.4 pg (ref 26.0–34.0)
MCHC: 34.2 g/dL (ref 30.0–36.0)
MCV: 77.2 fL — ABNORMAL LOW (ref 80.0–100.0)
Platelets: 245 10*3/uL (ref 150–400)
RBC: 4.17 MIL/uL — ABNORMAL LOW (ref 4.22–5.81)
RDW: 16.6 % — ABNORMAL HIGH (ref 11.5–15.5)
WBC: 12.2 10*3/uL — ABNORMAL HIGH (ref 4.0–10.5)
nRBC: 0 % (ref 0.0–0.2)

## 2020-06-30 LAB — SARS CORONAVIRUS 2 BY RT PCR (HOSPITAL ORDER, PERFORMED IN ~~LOC~~ HOSPITAL LAB): SARS Coronavirus 2: NEGATIVE

## 2020-06-30 MED ORDER — IBUPROFEN 600 MG PO TABS: 600.0000 mg | ORAL_TABLET | Freq: Once | ORAL | Status: DC

## 2020-06-30 NOTE — ED Triage Notes (Signed)
Pt to the er for overdose of heroin and fentanyl. Pt last used around 0800. Pt is hyper and fidgety in triage. Pt is in custody needing medical clearance.

## 2020-06-30 NOTE — ED Notes (Signed)
Magistrate observed to be going into Triage 4.  Shortly thereafter, Print production planner seen leaving the ED.  Patient then ambulated independently into WR, requesting to use phone to call a ride to leave.    AAOx3.  Skin warm and dry.  NAD.  Ambulates with easy and steady gait.  NAD

## 2020-07-10 ENCOUNTER — Other Ambulatory Visit: Payer: Self-pay

## 2020-07-10 DIAGNOSIS — Z5321 Procedure and treatment not carried out due to patient leaving prior to being seen by health care provider: Secondary | ICD-10-CM | POA: Insufficient documentation

## 2020-07-10 DIAGNOSIS — R69 Illness, unspecified: Secondary | ICD-10-CM | POA: Insufficient documentation

## 2020-07-10 LAB — COMPREHENSIVE METABOLIC PANEL
ALT: 204 U/L — ABNORMAL HIGH (ref 0–44)
AST: 98 U/L — ABNORMAL HIGH (ref 15–41)
Albumin: 3.6 g/dL (ref 3.5–5.0)
Alkaline Phosphatase: 93 U/L (ref 38–126)
Anion gap: 9 (ref 5–15)
BUN: 13 mg/dL (ref 6–20)
CO2: 25 mmol/L (ref 22–32)
Calcium: 8.3 mg/dL — ABNORMAL LOW (ref 8.9–10.3)
Chloride: 106 mmol/L (ref 98–111)
Creatinine, Ser: 1.13 mg/dL (ref 0.61–1.24)
GFR calc Af Amer: >60 mL/min (ref >60)
GFR calc non Af Amer: >60 mL/min (ref >60)
Glucose, Bld: 173 mg/dL — ABNORMAL HIGH (ref 70–99)
Potassium: 3.3 mmol/L — ABNORMAL LOW (ref 3.5–5.1)
Sodium: 140 mmol/L (ref 135–145)
Total Bilirubin: 0.6 mg/dL (ref 0.3–1.2)
Total Protein: 6.7 g/dL (ref 6.5–8.1)

## 2020-07-10 LAB — CBC
HCT: 28.8 % — ABNORMAL LOW (ref 39.0–52.0)
Hemoglobin: 9.6 g/dL — ABNORMAL LOW (ref 13.0–17.0)
MCH: 26.4 pg (ref 26.0–34.0)
MCHC: 33.3 g/dL (ref 30.0–36.0)
MCV: 79.3 fL — ABNORMAL LOW (ref 80.0–100.0)
Platelets: 226 10*3/uL (ref 150–400)
RBC: 3.63 MIL/uL — ABNORMAL LOW (ref 4.22–5.81)
RDW: 17.3 % — ABNORMAL HIGH (ref 11.5–15.5)
WBC: 15.4 10*3/uL — ABNORMAL HIGH (ref 4.0–10.5)
nRBC: 0 % (ref 0.0–0.2)

## 2020-07-10 LAB — URINE DRUG SCREEN, QUALITATIVE (ARMC ONLY)
Amphetamines, Ur Screen: NOT DETECTED
Barbiturates, Ur Screen: NOT DETECTED
Benzodiazepine, Ur Scrn: NOT DETECTED
Cannabinoid 50 Ng, Ur ~~LOC~~: POSITIVE — AB
Cocaine Metabolite,Ur ~~LOC~~: NOT DETECTED
MDMA (Ecstasy)Ur Screen: NOT DETECTED
Methadone Scn, Ur: NOT DETECTED
Opiate, Ur Screen: NOT DETECTED
Phencyclidine (PCP) Ur S: NOT DETECTED
Tricyclic, Ur Screen: NOT DETECTED

## 2020-07-10 LAB — ETHANOL: Alcohol, Ethyl (B): <10 mg/dL (ref <10)

## 2020-07-10 NOTE — ED Triage Notes (Signed)
Pt states he has not been feeling well for a couple of days. Pt states he thinks has had positive covid exposure. Pt states he also was using heroin and marijuana tonight. Pt states "the cops called" and was brought by ems. Pt denies SI or HI.

## 2020-07-10 NOTE — ED Notes (Signed)
FIRST NURSE NOTE: Pt arrived via [facility] was at his SO house with reports of patient "dancing with a telephone wire"  Pt is possibly on ecstasy and has a history of heroin abuse.

## 2020-07-11 ENCOUNTER — Emergency Department
Admission: EM | Admit: 2020-07-11 | Discharge: 2020-07-11 | Disposition: A | Discharge status: Home / Self Care | Payer: Self-pay | Attending: Emergency Medicine | Admitting: Emergency Medicine

## 2020-07-11 NOTE — ED Notes (Addendum)
Pt states he needs to leave. Pt is cooperative, understands will need to sign out ama. Iv removed intact. Pt is alert and oriented x4, resps unlabored, ambulatory with a steady gait. Pt verbalizes understanding that he may return with any concern. Pt also encouraged to stay and be seen. Pt declines at this time.

## 2020-07-17 ENCOUNTER — Emergency Department (HOSPITAL_COMMUNITY): Payer: HRSA Program

## 2020-07-17 ENCOUNTER — Other Ambulatory Visit: Payer: Self-pay

## 2020-07-17 ENCOUNTER — Encounter (HOSPITAL_COMMUNITY): Payer: Self-pay

## 2020-07-17 ENCOUNTER — Emergency Department (HOSPITAL_COMMUNITY)
Admission: EM | Admit: 2020-07-17 | Discharge: 2020-07-17 | Disposition: A | Discharge status: Home / Self Care | Payer: HRSA Program | Attending: Emergency Medicine | Admitting: Emergency Medicine

## 2020-07-17 DIAGNOSIS — L03211 Cellulitis of face: Secondary | ICD-10-CM | POA: Diagnosis not present

## 2020-07-17 DIAGNOSIS — U071 COVID-19: Secondary | ICD-10-CM | POA: Diagnosis not present

## 2020-07-17 DIAGNOSIS — F17200 Nicotine dependence, unspecified, uncomplicated: Secondary | ICD-10-CM | POA: Insufficient documentation

## 2020-07-17 DIAGNOSIS — R220 Localized swelling, mass and lump, head: Secondary | ICD-10-CM | POA: Diagnosis present

## 2020-07-17 LAB — CBC WITH DIFFERENTIAL/PLATELET
Abs Immature Granulocytes: 0.03 10*3/uL (ref 0.00–0.07)
Basophils Absolute: 0 10*3/uL (ref 0.0–0.1)
Basophils Relative: 0 %
Eosinophils Absolute: 0 10*3/uL (ref 0.0–0.5)
Eosinophils Relative: 0 %
HCT: 35.8 % — ABNORMAL LOW (ref 39.0–52.0)
Hemoglobin: 11.1 g/dL — ABNORMAL LOW (ref 13.0–17.0)
Immature Granulocytes: 0 %
Lymphocytes Relative: 12 %
Lymphs Abs: 1.1 10*3/uL (ref 0.7–4.0)
MCH: 26.3 pg (ref 26.0–34.0)
MCHC: 31 g/dL (ref 30.0–36.0)
MCV: 84.8 fL (ref 80.0–100.0)
Monocytes Absolute: 0.6 10*3/uL (ref 0.1–1.0)
Monocytes Relative: 6 %
Neutro Abs: 7.4 10*3/uL (ref 1.7–7.7)
Neutrophils Relative %: 82 %
Platelets: 207 10*3/uL (ref 150–400)
RBC: 4.22 MIL/uL (ref 4.22–5.81)
RDW: 17.4 % — ABNORMAL HIGH (ref 11.5–15.5)
WBC: 9.1 10*3/uL (ref 4.0–10.5)
nRBC: 0 % (ref 0.0–0.2)

## 2020-07-17 LAB — COMPREHENSIVE METABOLIC PANEL
ALT: 136 U/L — ABNORMAL HIGH (ref 0–44)
AST: 70 U/L — ABNORMAL HIGH (ref 15–41)
Albumin: 3.7 g/dL (ref 3.5–5.0)
Alkaline Phosphatase: 108 U/L (ref 38–126)
Anion gap: 8 (ref 5–15)
BUN: 24 mg/dL — ABNORMAL HIGH (ref 6–20)
CO2: 24 mmol/L (ref 22–32)
Calcium: 8.5 mg/dL — ABNORMAL LOW (ref 8.9–10.3)
Chloride: 101 mmol/L (ref 98–111)
Creatinine, Ser: 1.5 mg/dL — ABNORMAL HIGH (ref 0.61–1.24)
GFR calc Af Amer: >60 mL/min (ref >60)
GFR calc non Af Amer: 59 mL/min — ABNORMAL LOW (ref >60)
Glucose, Bld: 137 mg/dL — ABNORMAL HIGH (ref 70–99)
Potassium: 4.5 mmol/L (ref 3.5–5.1)
Sodium: 133 mmol/L — ABNORMAL LOW (ref 135–145)
Total Bilirubin: 0.6 mg/dL (ref 0.3–1.2)
Total Protein: 7.2 g/dL (ref 6.5–8.1)

## 2020-07-17 LAB — URINALYSIS, ROUTINE W REFLEX MICROSCOPIC
Bilirubin Urine: NEGATIVE
Glucose, UA: 50 mg/dL — AB
Hgb urine dipstick: NEGATIVE
Ketones, ur: NEGATIVE mg/dL
Leukocytes,Ua: NEGATIVE
Nitrite: NEGATIVE
Protein, ur: NEGATIVE mg/dL
Specific Gravity, Urine: 1.026 (ref 1.005–1.030)
pH: 6 (ref 5.0–8.0)

## 2020-07-17 LAB — LACTIC ACID, PLASMA: Lactic Acid, Venous: 1.3 mmol/L (ref 0.5–1.9)

## 2020-07-17 LAB — SARS CORONAVIRUS 2 BY RT PCR (HOSPITAL ORDER, PERFORMED IN ~~LOC~~ HOSPITAL LAB): SARS Coronavirus 2: POSITIVE — AB

## 2020-07-17 MED ORDER — IOHEXOL 300 MG/ML  SOLN
75.0000 mL | Freq: Once | INTRAMUSCULAR | Status: AC | PRN
  Administered 2020-07-17: 75 mL via INTRAVENOUS

## 2020-07-17 MED ORDER — SODIUM CHLORIDE 0.9 % IV BOLUS (SEPSIS)
1000.0000 mL | Freq: Once | INTRAVENOUS | Status: AC
  Administered 2020-07-17: 1000 mL via INTRAVENOUS

## 2020-07-17 MED ORDER — SODIUM CHLORIDE 0.9 % IV SOLN
1000.0000 mL | INTRAVENOUS | Status: DC
  Administered 2020-07-17: 1000 mL via INTRAVENOUS

## 2020-07-17 MED ORDER — CLINDAMYCIN PHOSPHATE 600 MG/50ML IV SOLN
600.0000 mg | Freq: Once | INTRAVENOUS | Status: AC
  Administered 2020-07-17: 600 mg via INTRAVENOUS
  Filled 2020-07-17: qty 50

## 2020-07-17 MED ORDER — HYDROMORPHONE HCL 1 MG/ML IJ SOLN
1.0000 mg | Freq: Once | INTRAMUSCULAR | Status: AC
  Administered 2020-07-17: 1 mg via INTRAVENOUS
  Filled 2020-07-17: qty 1

## 2020-07-17 MED ORDER — CLINDAMYCIN HCL 300 MG PO CAPS: 300.0000 mg | ORAL_CAPSULE | Freq: Four times a day (QID) | ORAL | 0 refills | Status: AC

## 2020-07-17 NOTE — ED Triage Notes (Signed)
Patient complains of acute onset of left sided face and lip swelling, no tongue swelling, denies trauma. Redness and swelling noted that started this am

## 2020-07-17 NOTE — Care Management (Signed)
ED CM received call from [facility] on [address] concerning being Covid Positive and Homeless.  CM will contact [facility].

## 2020-07-17 NOTE — ED Provider Notes (Signed)
  Face-to-face evaluation   History: He presents for swelling and pain in face for several hours since awakening today.  He also has general achiness in his "joints."  He denies known etiology or similar problem in the past.  Physical exam: Alert, calm, uncomfortable.  Moderate left facial swelling, including left nares, upper lip, and cheek.  There is also periorbital swelling, left-sided.  Medical screening examination/treatment/procedure(s) were conducted as a shared visit with non-physician practitioner(s) and myself.  I personally evaluated the patient during the encounter    Mancel Bale, MD 07/17/20 2030

## 2020-07-17 NOTE — Discharge Instructions (Signed)
Warm compresses 20 minutes every hour.  Your covid test is positive.

## 2020-07-17 NOTE — ED Notes (Signed)
Collected 1 set of blood cultures, PA made aware.  This nurse and 3 techs attempted to get another set of blood cultures but unsuccessful.

## 2020-07-17 NOTE — ED Notes (Signed)
Unable to obtain blood cultures x2.  

## 2020-07-17 NOTE — Progress Notes (Signed)
ED CM contacted [facility] health Department Covid Isolation Program. CM received new Covid Isolation packet, CM had policy and procedures to [facility] reviewed with patient he is agreeable to the terms, consent provided  Patient referral emailed  to [email or web address].. Transportation has been dispatched patient waiting in ED waiting area.

## 2020-07-17 NOTE — ED Provider Notes (Signed)
[facility] EMERGENCY DEPARTMENT Provider Note   CSN: [id number] Arrival date & time: 07/17/20  7939     History No chief complaint on file.   Michael Lester is a 34 y.o. male.  Pt complains of swelling to his face.  Pt reports he noticed yesterday.  Pt reports increased swelling this am.  Pt denies any dental pain.  No sore throat.  Pt denies any cough or congestion. Pt has not tried any treatment.  No relief with otc medications    The history is provided by the patient.       History reviewed. No pertinent past medical history.  Patient Active Problem List   Diagnosis Date Noted  . Foreign body (FB) in soft tissue   . IVDU (intravenous drug user) 05/02/2020  . Laceration of foot 05/02/2020  . Elevated liver enzymes 05/02/2020  . Elevated troponin 05/02/2020  . Accidental drug overdose   . Non-traumatic rhabdomyolysis   . NSTEMI (non-ST elevated myocardial infarction) (HCC)   . Neck pain 05/01/2020  . Hepatitis C antibody positive in blood 01/21/2020  . Severe sepsis (HCC) 12/30/2019  . Abscess in epidural space of cervical spine 12/21/2019  . Opioid dependence, daily use (HCC) 12/21/2019  . Chronic hepatitis C without hepatic coma (HCC) 12/21/2019  . Osteomyelitis of C6-7 12/20/2019  . Injection of illicit drug within last 12 months 12/20/2019  . Syncope   . Multiple lacerations 11/17/2014    Past Surgical History:  Procedure Laterality Date  . ANTERIOR CERVICAL DECOMP/DISCECTOMY FUSION N/A 12/22/2019   Procedure: cervical five-six, cervical six-seven anterior cervical discectomy with interbody fusion;  Surgeon: Julio Sicks, MD;  Location: [facility] OR;  Service: Neurosurgery;  Laterality: N/A;  . LACERATION REPAIR N/A 11/16/2014   Procedure: REPAIR MULTIPLE LACERATIONS;  Surgeon: Melvenia Beam, MD;  Location: [facility] OR;  Service: ENT;  Laterality: N/A;  . LACERATION REPAIR Left 11/16/2014   Procedure: REPAIR MULTIPLE LACERATIONS;  Surgeon: Manus Rudd, MD;   Location: [facility] OR;  Service: General;  Laterality: Left;  irrigation and suturing of lacerations on upper body       No family history on file.  Social History   Tobacco Use  . Smoking status: Current Every Day Smoker    Packs/day: 0.50  . Smokeless tobacco: Never Used  Vaping Use  . Vaping Use: Never used  Substance Use Topics  . Alcohol use: Not Currently  . Drug use: Yes    Types: Marijuana, Heroin, Cocaine    Comment: heroin and fentanyl    Home Medications Prior to Admission medications   Medication Sig Start Date End Date Taking? Authorizing Provider  gabapentin (NEURONTIN) 100 MG capsule Take 1 capsule (100 mg total) by mouth 3 (three) times daily. Patient not taking: Reported on 05/01/2020 02/02/20   Leatha Gilding, MD  nicotine (NICODERM CQ - DOSED IN MG/24 HOURS) 14 mg/24hr patch Place 1 patch (14 mg total) onto the skin daily. Patient not taking: Reported on 05/01/2020 02/02/20   Leatha Gilding, MD    Allergies    Shellfish allergy  Review of Systems   Review of Systems  All other systems reviewed and are negative.   Physical Exam Updated Vital Signs BP (!) 152/92 (BP Location: Right Arm)   Pulse 85   Temp 98.8 F (37.1 C) (Oral)   Resp 17   Ht 6' (1.829 m)   Wt 74.8 kg   SpO2 97%   BMI 22.38 kg/m   Physical Exam  Vitals and nursing note reviewed.  Constitutional:      Appearance: He is well-developed.  HENT:     Head: Normocephalic and atraumatic.  Eyes:     Conjunctiva/sclera: Conjunctivae normal.  Cardiovascular:     Rate and Rhythm: Normal rate and regular rhythm.     Heart sounds: No murmur heard.   Pulmonary:     Effort: Pulmonary effort is normal. No respiratory distress.     Breath sounds: Normal breath sounds.  Abdominal:     Palpations: Abdomen is soft.     Tenderness: There is no abdominal tenderness.  Musculoskeletal:     Cervical back: Neck supple.  Skin:    General: Skin is warm and dry.  Neurological:     General: No  focal deficit present.     Mental Status: He is alert.  Psychiatric:        Mood and Affect: Mood normal.     ED Results / Procedures / Treatments   Labs (all labs ordered are listed, but only abnormal results are displayed) Labs Reviewed  COMPREHENSIVE METABOLIC PANEL - Abnormal; Notable for the following components:      Result Value   Sodium 133 (*)    Glucose, Bld 137 (*)    BUN 24 (*)    Creatinine, Ser 1.50 (*)    Calcium 8.5 (*)    AST 70 (*)    ALT 136 (*)    GFR calc non Af Amer 59 (*)    All other components within normal limits  CBC WITH DIFFERENTIAL/PLATELET - Abnormal; Notable for the following components:   Hemoglobin 11.1 (*)    HCT 35.8 (*)    RDW 17.4 (*)    All other components within normal limits  CULTURE, BLOOD (ROUTINE X 2)  CULTURE, BLOOD (ROUTINE X 2)  SARS CORONAVIRUS 2 BY RT PCR (HOSPITAL ORDER, PERFORMED IN [facility])  LACTIC ACID, PLASMA  URINALYSIS, ROUTINE W REFLEX MICROSCOPIC    EKG None  Radiology CT Maxillofacial W Contrast  Result Date: 07/17/2020 CLINICAL DATA:  34 year old male with left side face and lip swelling and redness onset this morning. EXAM: CT MAXILLOFACIAL WITH CONTRAST TECHNIQUE: Multidetector CT imaging of the maxillofacial structures was performed with intravenous contrast. Multiplanar CT image reconstructions were also generated. CONTRAST:  29mL OMNIPAQUE IOHEXOL 300 MG/ML  SOLN COMPARISON:  None. FINDINGS: Osseous: Carious dentition throughout. But no periapical lucency or cortical breakthrough identified at the left maxilla or mandible alveolar process. Mandible is intact and normally located. No acute osseous abnormality identified. Partially visible prior cervical ACDF beginning at C5. Scant if any C5-C6 arthrodesis. Orbits: Intact orbital walls. Moderate left orbit preseptal and premalar soft tissue swelling and stranding. No periorbital gas or fluid collection. The right orbit appear spared. Mildly  Disconjugate gaze. The globes appear intact. No postseptal inflammation identified. Sinuses: Paranasal sinuses are clear bilaterally. Tympanic cavities and mastoids are clear. Soft tissues: Bulky soft tissue swelling and stranding along the left anterior face extending to the left nasal labial fold. No soft tissue gas. No organized or drainable fluid collection identified. The left masticator space and other deep soft tissue spaces appear spared. Reactive appearing left level 1 B lymph node measuring 10 mm short axis on series 3, image 25. Visible level 2 nodes appear fairly symmetric and within normal limits. Limited intracranial: Major vascular structures in the visible neck and at the skull base are patent including the left IJ. Cavernous sinus enhancement appears symmetric. Negative  visible brain parenchyma. IMPRESSION: Left face and preseptal periorbital cellulitis. Carious dentition, but no obvious dental source. No postseptal involvement or abscess identified. Reactive left level 1 lymph nodes. Electronically Signed   By: Odessa Fleming M.D.   On: 07/17/2020 14:25    Procedures Procedures (including critical care time)  Medications Ordered in ED Medications  sodium chloride 0.9 % bolus 1,000 mL (1,000 mLs Intravenous New Bag/Given 07/17/20 1455)    Followed by  0.9 %  sodium chloride infusion (1,000 mLs Intravenous New Bag/Given 07/17/20 1458)  clindamycin (CLEOCIN) IVPB 600 mg (600 mg Intravenous New Bag/Given 07/17/20 1500)  HYDROmorphone (DILAUDID) injection 1 mg (1 mg Intravenous Given 07/17/20 1456)  iohexol (OMNIPAQUE) 300 MG/ML solution 75 mL (75 mLs Intravenous Contrast Given 07/17/20 1359)    ED Course  I have reviewed the triage vital signs and the nursing notes.  Pertinent labs & imaging results that were available during my care of the patient were reviewed by me and considered in my medical decision making (see chart for details).    MDM Rules/Calculators/A&P                           MDM:  Pt has improved BUN and creatine from 1 week ago.  No wbc count.  Ct scan shows no evidence of abscess.  Celulitis appears limited to nasal fold and face.  Pt given clindamycin IV.  Covid test is positive  Final Clinical Impression(s) / ED Diagnoses Final diagnoses:  Cellulitis, face    Rx / DC Orders ED Discharge Orders         Ordered    clindamycin (CLEOCIN) 300 MG capsule  4 times daily        07/17/20 1600        An After Visit Summary was printed and given to the patient.    Elson Areas, PA-C 07/17/20 1600    Mancel Bale, MD 07/17/20 2030

## 2020-07-17 NOTE — ED Notes (Signed)
Called father to come pick pt up at ED explaining pt will be discharged and prescription for antibiotics will need to be picked up @ [facility] in [place].  Father advised with pt being COVID positive pt could not stay at his house d/t fathers wife being immunocompromised and  wifes mother who is 36 years old is at their house everyday.   This nurse giving pt discharge instructions.  Pt expressing distress over having "no where to stay and feeling sick".   This nurse talked with Keenan Bachelor PA who advised pt is not inpatient appropriate.  Dr. Effie Shy suggested "covid hotel".   This nurse has not heard of "covid hotel" and called Burna Mortimer, Sports coach, who is going to work on getting pt placed.   This nurse called [facility] [place] and had prescription transferred from [facility].  Glenice Laine at ED, this nurse went outside to give update and advised him to go to [facility], gave directions, pick up prescription and bring back to ED.

## 2020-07-18 ENCOUNTER — Telehealth (HOSPITAL_COMMUNITY): Payer: Self-pay | Admitting: Nurse Practitioner

## 2020-07-18 NOTE — Telephone Encounter (Signed)
Called to discuss with [name] about Covid symptoms and the use of casirivimab/imdevimab, a combination monoclonal antibody infusion for those with mild to moderate Covid symptoms and at a high risk of hospitalization.     Pt is qualified for this infusion at the [facility] infusion center due to co-morbid conditions (as indicated below) and/or a member of an at-risk group. Unable to reach. Father is trying to get in contact with patient as he does not own a personal phone.   Patient Active Problem List   Diagnosis Date Noted  . Foreign body (FB) in soft tissue   . IVDU (intravenous drug user) 05/02/2020  . Laceration of foot 05/02/2020  . Elevated liver enzymes 05/02/2020  . Elevated troponin 05/02/2020  . Accidental drug overdose   . Non-traumatic rhabdomyolysis   . NSTEMI (non-ST elevated myocardial infarction) (HCC)   . Neck pain 05/01/2020  . Hepatitis C antibody positive in blood 01/21/2020  . Severe sepsis (HCC) 12/30/2019  . Abscess in epidural space of cervical spine 12/21/2019  . Opioid dependence, daily use (HCC) 12/21/2019  . Chronic hepatitis C without hepatic coma (HCC) 12/21/2019  . Osteomyelitis of C6-7 12/20/2019  . Injection of illicit drug within last 12 months 12/20/2019  . Syncope   . Multiple lacerations 11/17/2014    Willette Alma, AGPCNP-BC

## 2020-07-23 LAB — CULTURE, BLOOD (ROUTINE X 2)
Culture: NO GROWTH
Special Requests: ADEQUATE

## 2020-09-15 ENCOUNTER — Encounter: Payer: Self-pay | Admitting: Emergency Medicine

## 2020-09-15 ENCOUNTER — Other Ambulatory Visit: Payer: Self-pay

## 2020-09-15 ENCOUNTER — Inpatient Hospital Stay
Admission: EM | Admit: 2020-09-15 | Discharge: 2020-09-18 | DRG: 682 | Disposition: A | Discharge status: Home / Self Care | Payer: Self-pay | Attending: Internal Medicine | Admitting: Internal Medicine

## 2020-09-15 DIAGNOSIS — M6282 Rhabdomyolysis: Secondary | ICD-10-CM | POA: Diagnosis present

## 2020-09-15 DIAGNOSIS — Z5900 Homelessness unspecified: Secondary | ICD-10-CM

## 2020-09-15 DIAGNOSIS — F1910 Other psychoactive substance abuse, uncomplicated: Secondary | ICD-10-CM

## 2020-09-15 DIAGNOSIS — M4802 Spinal stenosis, cervical region: Secondary | ICD-10-CM | POA: Diagnosis present

## 2020-09-15 DIAGNOSIS — R296 Repeated falls: Secondary | ICD-10-CM | POA: Diagnosis present

## 2020-09-15 DIAGNOSIS — W19XXXA Unspecified fall, initial encounter: Secondary | ICD-10-CM | POA: Diagnosis present

## 2020-09-15 DIAGNOSIS — F1121 Opioid dependence, in remission: Secondary | ICD-10-CM | POA: Diagnosis present

## 2020-09-15 DIAGNOSIS — D649 Anemia, unspecified: Secondary | ICD-10-CM | POA: Diagnosis present

## 2020-09-15 DIAGNOSIS — L299 Pruritus, unspecified: Secondary | ICD-10-CM | POA: Diagnosis present

## 2020-09-15 DIAGNOSIS — J9601 Acute respiratory failure with hypoxia: Secondary | ICD-10-CM | POA: Diagnosis present

## 2020-09-15 DIAGNOSIS — N179 Acute kidney failure, unspecified: Principal | ICD-10-CM

## 2020-09-15 DIAGNOSIS — Z8616 Personal history of COVID-19: Secondary | ICD-10-CM

## 2020-09-15 DIAGNOSIS — E872 Acidosis: Secondary | ICD-10-CM | POA: Diagnosis present

## 2020-09-15 DIAGNOSIS — R6510 Systemic inflammatory response syndrome (SIRS) of non-infectious origin without acute organ dysfunction: Secondary | ICD-10-CM | POA: Diagnosis present

## 2020-09-15 DIAGNOSIS — F17210 Nicotine dependence, cigarettes, uncomplicated: Secondary | ICD-10-CM | POA: Diagnosis present

## 2020-09-15 DIAGNOSIS — I2510 Atherosclerotic heart disease of native coronary artery without angina pectoris: Secondary | ICD-10-CM | POA: Diagnosis present

## 2020-09-15 DIAGNOSIS — Z8669 Personal history of other diseases of the nervous system and sense organs: Secondary | ICD-10-CM

## 2020-09-15 DIAGNOSIS — R4182 Altered mental status, unspecified: Secondary | ICD-10-CM | POA: Diagnosis present

## 2020-09-15 DIAGNOSIS — E869 Volume depletion, unspecified: Secondary | ICD-10-CM | POA: Diagnosis present

## 2020-09-15 DIAGNOSIS — Y900 Blood alcohol level of less than 20 mg/100 ml: Secondary | ICD-10-CM | POA: Diagnosis present

## 2020-09-15 DIAGNOSIS — Z981 Arthrodesis status: Secondary | ICD-10-CM

## 2020-09-15 DIAGNOSIS — R410 Disorientation, unspecified: Secondary | ICD-10-CM

## 2020-09-15 DIAGNOSIS — S80211A Abrasion, right knee, initial encounter: Secondary | ICD-10-CM

## 2020-09-15 DIAGNOSIS — R748 Abnormal levels of other serum enzymes: Secondary | ICD-10-CM | POA: Diagnosis present

## 2020-09-15 DIAGNOSIS — F1210 Cannabis abuse, uncomplicated: Secondary | ICD-10-CM | POA: Diagnosis present

## 2020-09-15 DIAGNOSIS — F10129 Alcohol abuse with intoxication, unspecified: Secondary | ICD-10-CM | POA: Diagnosis present

## 2020-09-15 DIAGNOSIS — I252 Old myocardial infarction: Secondary | ICD-10-CM

## 2020-09-15 DIAGNOSIS — S0990XA Unspecified injury of head, initial encounter: Secondary | ICD-10-CM | POA: Diagnosis present

## 2020-09-15 NOTE — ED Triage Notes (Addendum)
Pt arrived via [employer], pt was found in parking lot, admitted to only smoking marijuana, but etoh is also suspected.  Pt frequently drowsy in triage, incoherent at times, occasional involuntary jerking moverments.  Pt has hx of seizures. Has blood present on pants to R knee.  Pt able to answer some questions appropriately.  Per [employer] pt had a hard fall and hit head. Pt also reported when checking in with [employer] that he had recent neck surgery.

## 2020-09-16 ENCOUNTER — Emergency Department: Payer: Self-pay

## 2020-09-16 ENCOUNTER — Other Ambulatory Visit: Payer: Self-pay

## 2020-09-16 ENCOUNTER — Inpatient Hospital Stay: Payer: Self-pay

## 2020-09-16 ENCOUNTER — Encounter: Payer: Self-pay | Admitting: Family Medicine

## 2020-09-16 ENCOUNTER — Observation Stay: Payer: Self-pay

## 2020-09-16 DIAGNOSIS — R6510 Systemic inflammatory response syndrome (SIRS) of non-infectious origin without acute organ dysfunction: Secondary | ICD-10-CM

## 2020-09-16 DIAGNOSIS — F1910 Other psychoactive substance abuse, uncomplicated: Secondary | ICD-10-CM

## 2020-09-16 DIAGNOSIS — N179 Acute kidney failure, unspecified: Principal | ICD-10-CM

## 2020-09-16 DIAGNOSIS — R4182 Altered mental status, unspecified: Secondary | ICD-10-CM

## 2020-09-16 DIAGNOSIS — J9621 Acute and chronic respiratory failure with hypoxia: Secondary | ICD-10-CM

## 2020-09-16 DIAGNOSIS — T796XXA Traumatic ischemia of muscle, initial encounter: Secondary | ICD-10-CM

## 2020-09-16 LAB — CBC WITH DIFFERENTIAL/PLATELET
Abs Immature Granulocytes: 0.32 10*3/uL — ABNORMAL HIGH (ref 0.00–0.07)
Abs Immature Granulocytes: 0.16 10*3/uL — ABNORMAL HIGH (ref 0.00–0.07)
Basophils Absolute: 0.1 10*3/uL (ref 0.0–0.1)
Basophils Absolute: 0.1 10*3/uL (ref 0.0–0.1)
Basophils Relative: 0 %
Basophils Relative: 0 %
Eosinophils Absolute: 0 10*3/uL (ref 0.0–0.5)
Eosinophils Absolute: 0 10*3/uL (ref 0.0–0.5)
Eosinophils Relative: 0 %
Eosinophils Relative: 0 %
HCT: 34.9 % — ABNORMAL LOW (ref 39.0–52.0)
HCT: 32 % — ABNORMAL LOW (ref 39.0–52.0)
Hemoglobin: 11 g/dL — ABNORMAL LOW (ref 13.0–17.0)
Hemoglobin: 10 g/dL — ABNORMAL LOW (ref 13.0–17.0)
Immature Granulocytes: 1 %
Immature Granulocytes: 1 %
Lymphocytes Relative: 5 %
Lymphocytes Relative: 10 %
Lymphs Abs: 1.2 10*3/uL (ref 0.7–4.0)
Lymphs Abs: 2 10*3/uL (ref 0.7–4.0)
MCH: 26 pg (ref 26.0–34.0)
MCH: 25.9 pg — ABNORMAL LOW (ref 26.0–34.0)
MCHC: 31.5 g/dL (ref 30.0–36.0)
MCHC: 31.3 g/dL (ref 30.0–36.0)
MCV: 82.5 fL (ref 80.0–100.0)
MCV: 82.9 fL (ref 80.0–100.0)
Monocytes Absolute: 1.7 10*3/uL — ABNORMAL HIGH (ref 0.1–1.0)
Monocytes Absolute: 1.4 10*3/uL — ABNORMAL HIGH (ref 0.1–1.0)
Monocytes Relative: 7 %
Monocytes Relative: 7 %
Neutro Abs: 22.5 10*3/uL — ABNORMAL HIGH (ref 1.7–7.7)
Neutro Abs: 16.4 10*3/uL — ABNORMAL HIGH (ref 1.7–7.7)
Neutrophils Relative %: 87 %
Neutrophils Relative %: 82 %
Platelets: 270 10*3/uL (ref 150–400)
Platelets: 227 10*3/uL (ref 150–400)
RBC: 4.23 MIL/uL (ref 4.22–5.81)
RBC: 3.86 MIL/uL — ABNORMAL LOW (ref 4.22–5.81)
RDW: 16 % — ABNORMAL HIGH (ref 11.5–15.5)
RDW: 16.2 % — ABNORMAL HIGH (ref 11.5–15.5)
Smear Review: NORMAL
WBC: 25.8 10*3/uL — ABNORMAL HIGH (ref 4.0–10.5)
WBC: 19.9 10*3/uL — ABNORMAL HIGH (ref 4.0–10.5)
nRBC: 0 % (ref 0.0–0.2)
nRBC: 0 % (ref 0.0–0.2)

## 2020-09-16 LAB — CBC
HCT: 32.2 % — ABNORMAL LOW (ref 39.0–52.0)
Hemoglobin: 10 g/dL — ABNORMAL LOW (ref 13.0–17.0)
MCH: 25.7 pg — ABNORMAL LOW (ref 26.0–34.0)
MCHC: 31.1 g/dL (ref 30.0–36.0)
MCV: 82.8 fL (ref 80.0–100.0)
Platelets: 245 10*3/uL (ref 150–400)
RBC: 3.89 MIL/uL — ABNORMAL LOW (ref 4.22–5.81)
RDW: 16.2 % — ABNORMAL HIGH (ref 11.5–15.5)
WBC: 22.8 10*3/uL — ABNORMAL HIGH (ref 4.0–10.5)
nRBC: 0 % (ref 0.0–0.2)

## 2020-09-16 LAB — PROCALCITONIN: Procalcitonin: 1.9 ng/mL

## 2020-09-16 LAB — COMPREHENSIVE METABOLIC PANEL
ALT: 86 U/L — ABNORMAL HIGH (ref 0–44)
ALT: 90 U/L — ABNORMAL HIGH (ref 0–44)
AST: 59 U/L — ABNORMAL HIGH (ref 15–41)
AST: 77 U/L — ABNORMAL HIGH (ref 15–41)
Albumin: 4.2 g/dL (ref 3.5–5.0)
Albumin: 4.6 g/dL (ref 3.5–5.0)
Alkaline Phosphatase: 117 U/L (ref 38–126)
Alkaline Phosphatase: 98 U/L (ref 38–126)
Anion gap: 13 (ref 5–15)
Anion gap: 9 (ref 5–15)
BUN: 27 mg/dL — ABNORMAL HIGH (ref 6–20)
BUN: 28 mg/dL — ABNORMAL HIGH (ref 6–20)
CO2: 22 mmol/L (ref 22–32)
CO2: 25 mmol/L (ref 22–32)
Calcium: 8.5 mg/dL — ABNORMAL LOW (ref 8.9–10.3)
Calcium: 9 mg/dL (ref 8.9–10.3)
Chloride: 105 mmol/L (ref 98–111)
Chloride: 105 mmol/L (ref 98–111)
Creatinine, Ser: 2.03 mg/dL — ABNORMAL HIGH (ref 0.61–1.24)
Creatinine, Ser: 2.22 mg/dL — ABNORMAL HIGH (ref 0.61–1.24)
GFR, Estimated: 38 mL/min — ABNORMAL LOW (ref >60)
GFR, Estimated: 43 mL/min — ABNORMAL LOW (ref >60)
Glucose, Bld: 153 mg/dL — ABNORMAL HIGH (ref 70–99)
Glucose, Bld: 93 mg/dL (ref 70–99)
Potassium: 4.2 mmol/L (ref 3.5–5.1)
Potassium: 4.8 mmol/L (ref 3.5–5.1)
Sodium: 139 mmol/L (ref 135–145)
Sodium: 140 mmol/L (ref 135–145)
Total Bilirubin: 0.7 mg/dL (ref 0.3–1.2)
Total Bilirubin: 0.7 mg/dL (ref 0.3–1.2)
Total Protein: 7.9 g/dL (ref 6.5–8.1)
Total Protein: 8.5 g/dL — ABNORMAL HIGH (ref 6.5–8.1)

## 2020-09-16 LAB — PROTIME-INR
INR: 1.2 (ref 0.8–1.2)
INR: 1.2 (ref 0.8–1.2)
Prothrombin Time: 14.5 seconds (ref 11.4–15.2)
Prothrombin Time: 14.6 seconds (ref 11.4–15.2)

## 2020-09-16 LAB — URINALYSIS, COMPLETE (UACMP) WITH MICROSCOPIC
Bacteria, UA: NONE SEEN
Bilirubin Urine: NEGATIVE
Glucose, UA: NEGATIVE mg/dL
Hgb urine dipstick: NEGATIVE
Ketones, ur: NEGATIVE mg/dL
Leukocytes,Ua: NEGATIVE
Nitrite: NEGATIVE
Protein, ur: NEGATIVE mg/dL
Specific Gravity, Urine: 1.012 (ref 1.005–1.030)
pH: 5 (ref 5.0–8.0)

## 2020-09-16 LAB — BASIC METABOLIC PANEL
Anion gap: 11 (ref 5–15)
BUN: 30 mg/dL — ABNORMAL HIGH (ref 6–20)
CO2: 21 mmol/L — ABNORMAL LOW (ref 22–32)
Calcium: 8.6 mg/dL — ABNORMAL LOW (ref 8.9–10.3)
Chloride: 104 mmol/L (ref 98–111)
Creatinine, Ser: 2.2 mg/dL — ABNORMAL HIGH (ref 0.61–1.24)
GFR, Estimated: 39 mL/min — ABNORMAL LOW (ref >60)
Glucose, Bld: 139 mg/dL — ABNORMAL HIGH (ref 70–99)
Potassium: 4.6 mmol/L (ref 3.5–5.1)
Sodium: 136 mmol/L (ref 135–145)

## 2020-09-16 LAB — URINE DRUG SCREEN, QUALITATIVE (ARMC ONLY)
Amphetamines, Ur Screen: NOT DETECTED
Barbiturates, Ur Screen: NOT DETECTED
Benzodiazepine, Ur Scrn: NOT DETECTED
Cannabinoid 50 Ng, Ur ~~LOC~~: POSITIVE — AB
Cocaine Metabolite,Ur ~~LOC~~: NOT DETECTED
MDMA (Ecstasy)Ur Screen: NOT DETECTED
Methadone Scn, Ur: NOT DETECTED
Opiate, Ur Screen: NOT DETECTED
Phencyclidine (PCP) Ur S: NOT DETECTED
Tricyclic, Ur Screen: NOT DETECTED

## 2020-09-16 LAB — LACTIC ACID, PLASMA
Lactic Acid, Venous: 0.8 mmol/L (ref 0.5–1.9)
Lactic Acid, Venous: 1.5 mmol/L (ref 0.5–1.9)

## 2020-09-16 LAB — SALICYLATE LEVEL: Salicylate Lvl: <7 mg/dL — ABNORMAL LOW (ref 7.0–30.0)

## 2020-09-16 LAB — CK
Total CK: 1636 U/L — ABNORMAL HIGH (ref 49–397)
Total CK: 2946 U/L — ABNORMAL HIGH (ref 49–397)
Total CK: 840 U/L — ABNORMAL HIGH (ref 49–397)

## 2020-09-16 LAB — PHOSPHORUS: Phosphorus: 6.8 mg/dL — ABNORMAL HIGH (ref 2.5–4.6)

## 2020-09-16 LAB — MAGNESIUM: Magnesium: 3.1 mg/dL — ABNORMAL HIGH (ref 1.7–2.4)

## 2020-09-16 LAB — ETHANOL: Alcohol, Ethyl (B): <10 mg/dL (ref <10)

## 2020-09-16 LAB — SEDIMENTATION RATE: Sed Rate: 7 mm/hr (ref 0–15)

## 2020-09-16 LAB — TROPONIN I (HIGH SENSITIVITY): Troponin I (High Sensitivity): 124 ng/L (ref <18)

## 2020-09-16 LAB — APTT: aPTT: 29 seconds (ref 24–36)

## 2020-09-16 LAB — ACETAMINOPHEN LEVEL: Acetaminophen (Tylenol), Serum: <10 ug/mL — ABNORMAL LOW (ref 10–30)

## 2020-09-16 MED ORDER — TRAZODONE HCL 50 MG PO TABS: 25.0000 mg | ORAL_TABLET | Freq: Every evening | ORAL | Status: DC | PRN

## 2020-09-16 MED ORDER — ENOXAPARIN SODIUM 40 MG/0.4ML ~~LOC~~ SOLN
40.0000 mg | SUBCUTANEOUS | Status: DC
  Administered 2020-09-16 – 2020-09-18 (×3): 40 mg via SUBCUTANEOUS
  Filled 2020-09-16 (×3): qty 0.4

## 2020-09-16 MED ORDER — TECHNETIUM TO 99M ALBUMIN AGGREGATED
4.2300 | Freq: Once | INTRAVENOUS | Status: AC | PRN
  Administered 2020-09-16: 4.23 via INTRAVENOUS

## 2020-09-16 MED ORDER — THIAMINE HCL 100 MG PO TABS
100.0000 mg | ORAL_TABLET | Freq: Every day | ORAL | Status: DC
  Administered 2020-09-16 – 2020-09-18 (×3): 100 mg via ORAL
  Filled 2020-09-16 (×2): qty 1

## 2020-09-16 MED ORDER — LACTATED RINGERS IV BOLUS
1000.0000 mL | Freq: Once | INTRAVENOUS | Status: AC
  Administered 2020-09-16: 1000 mL via INTRAVENOUS

## 2020-09-16 MED ORDER — FOLIC ACID 1 MG PO TABS
1.0000 mg | ORAL_TABLET | Freq: Every day | ORAL | Status: DC
  Administered 2020-09-16 – 2020-09-18 (×3): 1 mg via ORAL
  Filled 2020-09-16 (×3): qty 1

## 2020-09-16 MED ORDER — SODIUM CHLORIDE 0.9 % IV SOLN
1.0000 g | INTRAVENOUS | Status: DC
  Administered 2020-09-16 – 2020-09-17 (×2): 1 g via INTRAVENOUS
  Filled 2020-09-16 (×3): qty 10

## 2020-09-16 MED ORDER — THIAMINE HCL 100 MG/ML IJ SOLN
Freq: Once | INTRAVENOUS | Status: AC
  Filled 2020-09-16: qty 1000

## 2020-09-16 MED ORDER — MAGNESIUM HYDROXIDE 400 MG/5ML PO SUSP: 30.0000 mL | Freq: Every day | ORAL | Status: DC | PRN

## 2020-09-16 MED ORDER — PIPERACILLIN-TAZOBACTAM 3.375 G IVPB: 3.3750 g | Freq: Three times a day (TID) | INTRAVENOUS | Status: DC

## 2020-09-16 MED ORDER — HALOPERIDOL LACTATE 5 MG/ML IJ SOLN: 2.0000 mg | Freq: Four times a day (QID) | INTRAMUSCULAR | Status: DC | PRN

## 2020-09-16 MED ORDER — ACETAMINOPHEN 325 MG PO TABS
650.0000 mg | ORAL_TABLET | Freq: Four times a day (QID) | ORAL | Status: DC | PRN
  Administered 2020-09-17: 650 mg via ORAL
  Filled 2020-09-16: qty 2

## 2020-09-16 MED ORDER — VANCOMYCIN HCL 1750 MG/350ML IV SOLN
1750.0000 mg | Freq: Once | INTRAVENOUS | Status: AC
  Administered 2020-09-16: 1750 mg via INTRAVENOUS
  Filled 2020-09-16: qty 350

## 2020-09-16 MED ORDER — ACETAMINOPHEN 650 MG RE SUPP: 650.0000 mg | Freq: Four times a day (QID) | RECTAL | Status: DC | PRN

## 2020-09-16 MED ORDER — SODIUM CHLORIDE 0.9 % IV BOLUS
1000.0000 mL | Freq: Once | INTRAVENOUS | Status: AC
  Administered 2020-09-16: 1000 mL via INTRAVENOUS

## 2020-09-16 MED ORDER — LORAZEPAM 1 MG PO TABS
1.0000 mg | ORAL_TABLET | ORAL | Status: DC | PRN
  Administered 2020-09-16 (×2): 1 mg via ORAL
  Filled 2020-09-16 (×2): qty 1

## 2020-09-16 MED ORDER — ONDANSETRON HCL 4 MG PO TABS: 4.0000 mg | ORAL_TABLET | Freq: Four times a day (QID) | ORAL | Status: DC | PRN

## 2020-09-16 MED ORDER — SODIUM CHLORIDE 0.9 % IV SOLN: INTRAVENOUS | Status: DC

## 2020-09-16 MED ORDER — ONDANSETRON HCL 4 MG/2ML IJ SOLN: 4.0000 mg | Freq: Four times a day (QID) | INTRAMUSCULAR | Status: DC | PRN

## 2020-09-16 MED ORDER — LORAZEPAM 2 MG/ML IJ SOLN
1.0000 mg | INTRAMUSCULAR | Status: DC | PRN
  Administered 2020-09-16: 2 mg via INTRAVENOUS
  Filled 2020-09-16: qty 1

## 2020-09-16 MED ORDER — VANCOMYCIN HCL 750 MG/150ML IV SOLN
750.0000 mg | Freq: Two times a day (BID) | INTRAVENOUS | Status: DC
  Administered 2020-09-16 – 2020-09-18 (×4): 750 mg via INTRAVENOUS
  Filled 2020-09-16 (×5): qty 150

## 2020-09-16 MED ORDER — LORAZEPAM 2 MG/ML IJ SOLN: 1.0000 mg | INTRAMUSCULAR | Status: DC | PRN

## 2020-09-16 NOTE — ED Provider Notes (Signed)
[facility] Emergency Department Provider Note ____________________________________________   First MD Initiated Contact with Patient 09/15/20 2358     (approximate)  I have reviewed the triage vital signs and the nursing notes.  HISTORY  Chief Complaint Alcohol Intoxication  EM caveat: Confusion, poor historian  HPI Michael Lester is a 34 y.o. male   reports that he came here because he does not have a place to stay.  Does tell me he used drugs including marijuana just prior.  Reports he was in the parking lot not really sure where he was going to sleep tonight as he is homeless  Patient does report he fell.  Scraped his right knee up some.  Reports he has a long history of doing drugs in the past.  He also has a history of neck surgeries   Review of records indicates a long history of opioid dependence, prior NSTEMI, rhabdo, drug overdoses as well as severe sepsis and a history of abscess epidural  History reviewed. No pertinent past medical history.  Patient Active Problem List   Diagnosis Date Noted  . AMS (altered mental status) 09/16/2020  . Foreign body (FB) in soft tissue   . IVDU (intravenous drug user) 05/02/2020  . Laceration of foot 05/02/2020  . Elevated liver enzymes 05/02/2020  . Elevated troponin 05/02/2020  . Accidental drug overdose   . Non-traumatic rhabdomyolysis   . NSTEMI (non-ST elevated myocardial infarction) ([place])   . Neck pain 05/01/2020  . Hepatitis C antibody positive in blood 01/21/2020  . Severe sepsis ([place]) 12/30/2019  . Abscess in epidural space of cervical spine 12/21/2019  . Opioid dependence, daily use ([place]) 12/21/2019  . Chronic hepatitis C without hepatic coma ([place]) 12/21/2019  . Osteomyelitis of C6-7 12/20/2019  . Injection of illicit drug within last 12 months 12/20/2019  . Syncope   . Multiple lacerations 11/17/2014    Past Surgical History:  Procedure Laterality Date  . ANTERIOR CERVICAL  DECOMP/DISCECTOMY FUSION N/A 12/22/2019   Procedure: cervical five-six, cervical six-seven anterior cervical discectomy with interbody fusion;  Surgeon: Earnie Larsson, MD;  Location: [place];  Service: Neurosurgery;  Laterality: N/A;  . LACERATION REPAIR N/A 11/16/2014   Procedure: REPAIR MULTIPLE LACERATIONS;  Surgeon: Ruby Cola, MD;  Location: [place];  Service: ENT;  Laterality: N/A;  . LACERATION REPAIR Left 11/16/2014   Procedure: REPAIR MULTIPLE LACERATIONS;  Surgeon: Donnie Mesa, MD;  Location: [place];  Service: General;  Laterality: Left;  irrigation and suturing of lacerations on upper body    Prior to Admission medications   Medication Sig Start Date End Date Taking? Authorizing Provider  gabapentin (NEURONTIN) 100 MG capsule Take 1 capsule (100 mg total) by mouth 3 (three) times daily. Patient not taking: Reported on 05/01/2020 02/02/20   Caren Griffins, MD  nicotine (NICODERM CQ - DOSED IN MG/24 HOURS) 14 mg/24hr patch Place 1 patch (14 mg total) onto the skin daily. Patient not taking: Reported on 05/01/2020 02/02/20   Caren Griffins, MD    Allergies Shellfish allergy  History reviewed. No pertinent family history.  Social History Social History   Tobacco Use  . Smoking status: Current Every Day Smoker    Packs/day: 0.50  . Smokeless tobacco: Never Used  Vaping Use  . Vaping Use: Never used  Substance Use Topics  . Alcohol use: Not Currently  . Drug use: Yes    Types: Marijuana, Heroin, Cocaine    Comment: heroin and fentanyl  Review of Systems  Patient is not a great historian, he does report he was looking for a place to stay tonight.  Does report drug use today.  Denies recent fevers chills or pain.    ____________________________________________   PHYSICAL EXAM:  VITAL SIGNS: ED Triage Vitals  Enc Vitals Group     BP 09/15/20 2347 137/79     Pulse Rate 09/15/20 2347 (!) 121     Resp 09/15/20 2347 (!) 22     Temp 09/15/20 2347 (!) 97.5 F (36.4 C)      Temp Source 09/15/20 2347 Oral     SpO2 09/15/20 2347 (!) 88 %     Weight 09/15/20 2352 160 lb (72.6 kg)     Height 09/15/20 2352 6' (1.829 m)     Head Circumference --      Peak Flow --      Pain Score --      Pain Loc --      Pain Edu? --      Excl. in [place]? --     Constitutional: Alert and oriented to self, but seems slightly confused Eyes: Conjunctivae are normal.  Pupils are somewhat constricted bilateral Head: Atraumatic. Nose: No congestion/rhinnorhea. Mouth/Throat: Mucous membranes are dry Neck: No stridor.  Cardiovascular: Mildly tachycardic rate, regular rhythm. Grossly normal heart sounds.  Good peripheral circulation. Respiratory: Normal respiratory effort.  No retractions. Lungs CTAB. Gastrointestinal: Soft and nontender. No distention. Musculoskeletal: No lower extremity tenderness nor edema.  Small abrasion noted to the right knee.  Full range of motion of all extremities.  Patient reports just feels like he scuffed up his right knee.  No evidence of deformities Neurologic:  Normal speech and language, except some slight slurring.  Prefers to rest and somewhat somnolent Skin:  Skin is warm, dry and intact. No rash noted. Psychiatric: Mood and affect are calm, somewhat somnolent.  No agitation.  Is noted to be frequently itching and picking at his skin at times.  ____________________________________________   LABS (all labs ordered are listed, but only abnormal results are displayed)  Labs Reviewed  COMPREHENSIVE METABOLIC PANEL - Abnormal; Notable for the following components:      Result Value   Glucose, Bld 153 (*)    BUN 27 (*)    Creatinine, Ser 2.22 (*)    Total Protein 8.5 (*)    AST 59 (*)    ALT 90 (*)    GFR, Estimated 38 (*)    All other components within normal limits  SALICYLATE LEVEL - Abnormal; Notable for the following components:   Salicylate Lvl <4.6 (*)    All other components within normal limits  ACETAMINOPHEN LEVEL - Abnormal; Notable  for the following components:   Acetaminophen (Tylenol), Serum <10 (*)    All other components within normal limits  CBC WITH DIFFERENTIAL/PLATELET - Abnormal; Notable for the following components:   WBC 25.8 (*)    Hemoglobin 11.0 (*)    HCT 34.9 (*)    RDW 16.0 (*)    Neutro Abs 22.5 (*)    Monocytes Absolute 1.7 (*)    Abs Immature Granulocytes 0.32 (*)    All other components within normal limits  CK - Abnormal; Notable for the following components:   Total CK 840 (*)    All other components within normal limits  CULTURE, BLOOD (ROUTINE X 2)  CULTURE, BLOOD (ROUTINE X 2)  RESP PANEL BY RT-PCR (FLU A&B, COVID) ARPGX2  ETHANOL  SEDIMENTATION RATE  LACTIC ACID, PLASMA  PROTIME-INR  URINE DRUG SCREEN, QUALITATIVE ([facility] ONLY)  LACTIC ACID, PLASMA  URINALYSIS, COMPLETE (UACMP) WITH MICROSCOPIC  PROCALCITONIN  BASIC METABOLIC PANEL  CBC  CK  CK  CBG MONITORING, ED  TROPONIN I (HIGH SENSITIVITY)   ____________________________________________  EKG  Reviewed interpreted at 0015 minutes Heart rate 115 QRS 110 QTc 460 Sinus tachycardia, no evidence of acute ischemia denoted ____________________________________________  RADIOLOGY  CT HEAD WO CONTRAST  Result Date: 09/16/2020 CLINICAL DATA:  Delirium, substance abuse EXAM: CT HEAD WITHOUT CONTRAST CT CERVICAL SPINE WITHOUT CONTRAST TECHNIQUE: Multidetector CT imaging of the head and cervical spine was performed following the standard protocol without intravenous contrast. Multiplanar CT image reconstructions of the cervical spine were also generated. COMPARISON:  05/01/2020 FINDINGS: CT HEAD FINDINGS Brain: Normal anatomic configuration. No abnormal intra or extra-axial mass lesion or fluid collection. No abnormal mass effect or midline shift. No evidence of acute intracranial hemorrhage or infarct. Ventricular size is normal. Cerebellum unremarkable. Vascular: Unremarkable Skull: Intact Sinuses/Orbits: Paranasal sinuses are  clear. Orbits are unremarkable. Other: Mastoid air cells and middle ear cavities are clear. CT CERVICAL SPINE FINDINGS Alignment: Anterior cervical discectomy and fusion procedure of C5-C7 has been performed with instrumentation. Normal alignment across the fused segment. No definite bridging callus or solid incorporation of interbody bone graft identified at this time, however, no periprosthetic lucency identified to suggest motion. No listhesis. Skull base and vertebrae: Craniocervical junction is unremarkable. Atlantodental interval is normal. No acute fracture of the cervical spine. No lytic or blastic bone lesion peer Soft tissues and spinal canal: No prevertebral fluid or swelling. No visible canal hematoma. Disc levels: There is intervertebral disc space narrowing and endplate remodeling at R4-2 and C7-T1 in keeping changes of mild degenerative disc disease. Remaining intervertebral disc heights are preserved. Vertebral body heights are preserved. Spinal canal is widely patent. Prevertebral soft tissues are unremarkable. Review of the axial images demonstrates uncovertebral arthrosis resulting in moderate bilateral neural foraminal narrowing at C5-6 and C6-7 Upper chest: Mild paraseptal emphysema noted within the lung apices. Other: None IMPRESSION: No acute intracranial injury.  No calvarial fracture. No acute fracture or listhesis of the cervical spine. Status post anterior cervical discectomy and fusion of C5-C7 without definite evidence of solid incorporation of the interbody bone graft. No definite evidence of motion at the fused segment, however. Degenerative changes resulting in moderate neural foraminal narrowing at C5-6 and C6-7 bilaterally. Emphysema (ICD10-J43.9). Electronically Signed   By: Fidela Salisbury MD   On: 09/16/2020 01:01   CT Cervical Spine Wo Contrast  Result Date: 09/16/2020 CLINICAL DATA:  Delirium, substance abuse EXAM: CT HEAD WITHOUT CONTRAST CT CERVICAL SPINE WITHOUT CONTRAST  TECHNIQUE: Multidetector CT imaging of the head and cervical spine was performed following the standard protocol without intravenous contrast. Multiplanar CT image reconstructions of the cervical spine were also generated. COMPARISON:  05/01/2020 FINDINGS: CT HEAD FINDINGS Brain: Normal anatomic configuration. No abnormal intra or extra-axial mass lesion or fluid collection. No abnormal mass effect or midline shift. No evidence of acute intracranial hemorrhage or infarct. Ventricular size is normal. Cerebellum unremarkable. Vascular: Unremarkable Skull: Intact Sinuses/Orbits: Paranasal sinuses are clear. Orbits are unremarkable. Other: Mastoid air cells and middle ear cavities are clear. CT CERVICAL SPINE FINDINGS Alignment: Anterior cervical discectomy and fusion procedure of C5-C7 has been performed with instrumentation. Normal alignment across the fused segment. No definite bridging callus or solid incorporation of interbody bone graft identified at this time, however, no periprosthetic lucency identified to  suggest motion. No listhesis. Skull base and vertebrae: Craniocervical junction is unremarkable. Atlantodental interval is normal. No acute fracture of the cervical spine. No lytic or blastic bone lesion peer Soft tissues and spinal canal: No prevertebral fluid or swelling. No visible canal hematoma. Disc levels: There is intervertebral disc space narrowing and endplate remodeling at G8-1 and C7-T1 in keeping changes of mild degenerative disc disease. Remaining intervertebral disc heights are preserved. Vertebral body heights are preserved. Spinal canal is widely patent. Prevertebral soft tissues are unremarkable. Review of the axial images demonstrates uncovertebral arthrosis resulting in moderate bilateral neural foraminal narrowing at C5-6 and C6-7 Upper chest: Mild paraseptal emphysema noted within the lung apices. Other: None IMPRESSION: No acute intracranial injury.  No calvarial fracture. No acute  fracture or listhesis of the cervical spine. Status post anterior cervical discectomy and fusion of C5-C7 without definite evidence of solid incorporation of the interbody bone graft. No definite evidence of motion at the fused segment, however. Degenerative changes resulting in moderate neural foraminal narrowing at C5-6 and C6-7 bilaterally. Emphysema (ICD10-J43.9). Electronically Signed   By: Fidela Salisbury MD   On: 09/16/2020 01:01   DG Chest Portable 1 View  Result Date: 09/16/2020 CLINICAL DATA:  Hypoxia, somnolence, altered mental status EXAM: PORTABLE CHEST 1 VIEW COMPARISON:  None. FINDINGS: The heart size and mediastinal contours are within normal limits. Both lungs are clear. The visualized skeletal structures are unremarkable. IMPRESSION: No active disease. Electronically Signed   By: Fidela Salisbury MD   On: 09/16/2020 00:49    Chest x-ray reviewed negative for acute chest finding.  CT the head and cervical spine reviewed, I do not see evidence of acute finding reported by the radiologist.  There is mention of previous cervical discectomy and fusion without evidence of solid incorporation - see report ____________________________________________   PROCEDURES  Procedure(s) performed: None  Procedures  Critical Care performed: No  ____________________________________________   INITIAL IMPRESSION / ASSESSMENT AND PLAN / ED COURSE  Pertinent labs & imaging results that were available during my care of the patient were reviewed by me and considered in my medical decision making (see chart for details).  ----------------------------------------- 2:25 AM on 09/16/2020 -----------------------------------------  Mental status seems to be continuously improving.  Fully awake and alert at this time.  Feels thirsty, no other complaints from the patient.   Patient presents for alteration of mental status, fall, also noted to have small abrasion of the right knee.  Is a strong history  of polysubstance abuse but also has a history of epidural abscess, severe sepsis.  He reports that he came to the hospital because of homelessness and has been using drugs again this morning, exactly which ones is somewhat unclear.  He does appear to potentially be under the influence.  He is notably afebrile but has severe leukocytosis.  There is concern for possible infection based on leukocytosis but without fever and without specific complaint of infectious symptoms I am inclined to obtain blood cultures, check procalcitonin and ESR.  He does not have any associated back or musculoskeletal pain that would suggest epidural abscess.  He does denies any headache.  After his medical condition and altered mental status has cleared at about 1:30 in the morning he reports no specific pain or symptoms that would highly suggest infection and he reports his IV drug abuse is distant.  No murmurs or signs of endocarditis  Clinical Course as of Sep 16 226  Fri Sep 16, 2020  0050 Review  of labs demonstrates a positive Covid test in September   [MQ]  0050 Lab work demonstrates mild AKI as well as some evidence of developing rhabdo.   [MQ]  [phone] Discussed with patient, he is awake and alert, appears generally fatigued.  Does or did have some early hypoxia during his course his chest x-ray reviewed by me does not show evidence of acute cause.  I am concerned about a significant leukocytosis but he denies pain no neck pain no body pain, no fever.  Has previously had Covid within the last 3 months.  CK mildly elevated, likely dehydrated.  Send blood cultures and evaluate for potential infectious etiologies, send ESR.  No obvious source identified at this point.   [MQ]    Clinical Course User Index [MQ] Delman Kitten, MD    Admission discussed with Dr. Sidney Ace, patient will need further work-up and monitoring for concerns of altered mental status which I feel is likely secondary to polysubstance abuse based on the  patient history but needs further work-up.  Cultures ordered and infectious work-up still pending ____________________________________________   FINAL CLINICAL IMPRESSION(S) / ED DIAGNOSES  Final diagnoses:  Delirium  Polysubstance abuse (HCC)  AKI (acute kidney injury) ([place])  Non-traumatic rhabdomyolysis  Abrasion of right knee, initial encounter        Note:  This document was prepared using Dragon voice recognition software and may include unintentional dictation errors       Delman Kitten, MD 09/16/20 [phone]

## 2020-09-16 NOTE — Progress Notes (Signed)
PT Cancellation Note  Patient Details Name: Michael Lester MRN: [id number] DOB: 09-26-1986   Cancelled Treatment:    Reason Eval/Treat Not Completed: Fatigue/lethargy limiting ability to participate (Two attempt to see patient. Pt remains lethargic, incoherent, unable to answer questions, apperas to fall asleep after each question. Pt unable to participate at this time, unsafe to attempt 'intoxicated' AMB.) Will attempt at later date/time as appropriate.     Jenilee Franey C 09/16/2020, 4:04 PM

## 2020-09-16 NOTE — Progress Notes (Signed)
PROGRESS NOTE    Michael Lester  SKA:[id number] DOB: 1986-06-30 DOA: 09/15/2020 PCP: Patient, [name]   Brief Narrative: Taken from H&P Michael Lester  is a 34 y.o. Caucasian male with a known history of seizure disorder, coronary artery disease and polysubstance abuse, presented to emergency room with acute onset of altered mental status and fall with subsequent head injury.  Patient was found in the hospital parking lot as he is homeless and was not sure where to go to sleep tonight.  He admitted to using drugs including marijuana and alcohol.  He has a previous history of IV drug abuse in the distant past. Found to have elevated CK, AKI, leukocytosis and transaminitis. Procalcitonin was elevated at 1.90, troponin elevated at 124, UA appears benign, UDS positive for marijuana, blood cultures remain negative in 12 hours, chest x-ray negative, CT head and cervical spine without any acute abnormality.  Currently sepsis ruled out as there is no source of infection.  Subjective: Patient appears quite somnolent and only stating yes ma'am on all the questions.  Unable to get much out of him today.  Continue to fall asleep, appears more functional as he was sitting with his eyes close but able to maintain his posture.  Assessment & Plan:   Active Problems:   AMS (altered mental status)  Altered mental status.  Not sure about the cause, no witnessed seizure but CK continue to rise.  Initially met SIRS criteria with leukocytosis, tachypnea, tachycardia, no obvious source of infection.  Most likely just a SIRS response.  Sepsis ruled out unless we find a obvious source of infection. Blood cultures negative in 12 hours, urine cultures pending. Imaging negative for any acute changes. Procalcitonin was elevated at 1.9 but it can be elevated with rhabdomyolysis. Patient do have some functional element in his altered mental status. Initially received Zosyn and vancomycin.  I will not continue Zosyn due to  AKI as Zosyn and vancomycin combination can cause worsening of his renal function -Start him on ceftriaxone for some gram-negative coverage until cultures are negative.  Patient also has an history of IV drug use in the past. -Continue with vancomycin for another day till we have all cultures negative. -Monitor procalcitonin. -We will obtain psych evaluation.  AKI.  Some improvement in creatinine with IV fluid, it was 2.03 today.  With BUN of 28, baseline around 1.1.  Patient also has rhabdomyolysis with elevated CK. -Continue with IV fluid. -Continue to monitor. -Avoid nephrotoxins.  Rhabdomyolysis.  Might be secondary to fall.  No unwitnessed seizure. CK continue to rise. -Continue with IV fluid. -Continue to monitor.  Acute hypoxic respiratory failure on presentation.  Quickly resolved and patient is on room air now.  Low suspicion for PE. VQ scan was ordered by admitting provider-pending results.  History of alcohol and substance abuse.  Blood alcohol level within normal limit.  UDS positive for marijuana. -Continue with CIWA protocol. -Thiamine 100 mg daily.  Objective: Vitals:   09/16/20 0230 09/16/20 0255 09/16/20 0737 09/16/20 1232  BP: 131/84 124/70 110/77 109/73  Pulse: (!) 101 (!) 107 94 97  Resp: 18 16 16    Temp:  (!) 97.5 F (36.4 C) 97.9 F (36.6 C) 98.1 F (36.7 C)  TempSrc:  Oral Oral Oral  SpO2: 96% 96% 100% 97%  Weight:      Height:        Intake/Output Summary (Last 24 hours) at 09/16/2020 1359 Last data filed at 09/16/2020 1054 Gross per 24  hour  Intake 1000 ml  Output 320 ml  Net 680 ml   Filed Weights   09/15/20 2352  Weight: 72.6 kg    Examination:  General exam: Patient keeping his eyes closed, calm and comfortable  Respiratory system: Clear to auscultation. Respiratory effort normal. Cardiovascular system: S1 & S2 heard, RRR. No JVD, murmurs, rubs, gallops or clicks. Gastrointestinal system: Soft, nontender, nondistended, bowel sounds  positive. Central nervous system: Alert and oriented. No focal neurological deficits.Symmetric 5 x 5 power. Extremities: No edema, no cyanosis, pulses intact and symmetrical. Psychiatry: Judgement and insight appear normal.     DVT prophylaxis: Lovenox Code Status: Full Family Communication: Called father with no response. Disposition Plan:  Status is: Inpatient  Remains inpatient appropriate because:Inpatient level of care appropriate due to severity of illness   Dispo: The patient is from: Home              Anticipated d/c is to: Home              Anticipated d/c date is: 1 day              Patient currently is not medically stable to d/c.  Patient is currently homeless.  Also obtain psychiatry evaluation.  Consultants:   Psychiatry  Procedures:  Antimicrobials:  Vancomycin Ceftriaxone  Data Reviewed: I have personally reviewed following labs and imaging studies  CBC: Recent Labs  Lab 09/16/20 0019 09/16/20 0317 09/16/20 0512  WBC 25.8* 22.8* 19.9*  NEUTROABS 22.5*  --  16.4*  HGB 11.0* 10.0* 10.0*  HCT 34.9* 32.2* 32.0*  MCV 82.5 82.8 82.9  PLT 270 245 453   Basic Metabolic Panel: Recent Labs  Lab 09/16/20 0019 09/16/20 0317 09/16/20 0512 09/16/20 0804  NA 140 136 139  --   K 4.2 4.6 4.8  --   CL 105 104 105  --   CO2 22 21* 25  --   GLUCOSE 153* 139* 93  --   BUN 27* 30* 28*  --   CREATININE 2.22* 2.20* 2.03*  --   CALCIUM 9.0 8.6* 8.5*  --   MG  --   --   --  3.1*  PHOS  --   --   --  6.8*   GFR: Estimated Creatinine Clearance: 51.7 mL/min (A) (by C-G formula based on SCr of 2.03 mg/dL (H)). Liver Function Tests: Recent Labs  Lab 09/16/20 0019 09/16/20 0512  AST 59* 77*  ALT 90* 86*  ALKPHOS 117 98  BILITOT 0.7 0.7  PROT 8.5* 7.9  ALBUMIN 4.6 4.2   No results for input(s): LIPASE, AMYLASE in the last 168 hours. No results for input(s): AMMONIA in the last 168 hours. Coagulation Profile: Recent Labs  Lab 09/16/20 0019 09/16/20 0512   INR 1.2 1.2   Cardiac Enzymes: Recent Labs  Lab 09/16/20 0019 09/16/20 0317 09/16/20 0804  CKTOTAL 840* 1,636* 2,946*   BNP (last 3 results) No results for input(s): PROBNP in the last 8760 hours. HbA1C: No results for input(s): HGBA1C in the last 72 hours. CBG: No results for input(s): GLUCAP in the last 168 hours. Lipid Profile: No results for input(s): CHOL, HDL, LDLCALC, TRIG, CHOLHDL, LDLDIRECT in the last 72 hours. Thyroid Function Tests: No results for input(s): TSH, T4TOTAL, FREET4, T3FREE, THYROIDAB in the last 72 hours. Anemia Panel: No results for input(s): VITAMINB12, FOLATE, FERRITIN, TIBC, IRON, RETICCTPCT in the last 72 hours. Sepsis Labs: Recent Labs  Lab 09/16/20 0019 09/16/20 0111 09/16/20 6468  PROCALCITON 1.90  --   --   LATICACIDVEN  --  0.8 1.5    Recent Results (from the past 240 hour(s))  Blood Culture (routine x 2)     Status: None (Preliminary result)   Collection Time: 09/16/20  1:33 AM   Specimen: BLOOD  Result Value Ref Range Status   Specimen Description BLOOD BLOOD LEFT FOREARM  Final   Special Requests   Final    BOTTLES DRAWN AEROBIC AND ANAEROBIC Blood Culture results may not be optimal due to an excessive volume of blood received in culture bottles   Culture   Final    NO GROWTH < 12 HOURS Performed at [facility], [address], [place], [place] [address]    Report Status PENDING  Incomplete  Blood Culture (routine x 2)     Status: None (Preliminary result)   Collection Time: 09/16/20  1:35 AM   Specimen: BLOOD  Result Value Ref Range Status   Specimen Description BLOOD BLOOD RIGHT WRIST  Final   Special Requests   Final    BOTTLES DRAWN AEROBIC AND ANAEROBIC Blood Culture results may not be optimal due to an excessive volume of blood received in culture bottles   Culture   Final    NO GROWTH < 12 HOURS Performed at [facility] [address] [place], [place] [address]    Report Status PENDING   Incomplete     Radiology Studies: CT HEAD WO CONTRAST  Result Date: 09/16/2020 CLINICAL DATA:  Delirium, substance abuse EXAM: CT HEAD WITHOUT CONTRAST CT CERVICAL SPINE WITHOUT CONTRAST TECHNIQUE: Multidetector CT imaging of the head and cervical spine was performed following the standard protocol without intravenous contrast. Multiplanar CT image reconstructions of the cervical spine were also generated. COMPARISON:  05/01/2020 FINDINGS: CT HEAD FINDINGS Brain: Normal anatomic configuration. No abnormal intra or extra-axial mass lesion or fluid collection. No abnormal mass effect or midline shift. No evidence of acute intracranial hemorrhage or infarct. Ventricular size is normal. Cerebellum unremarkable. Vascular: Unremarkable Skull: Intact Sinuses/Orbits: Paranasal sinuses are clear. Orbits are unremarkable. Other: Mastoid air cells and middle ear cavities are clear. CT CERVICAL SPINE FINDINGS Alignment: Anterior cervical discectomy and fusion procedure of C5-C7 has been performed with instrumentation. Normal alignment across the fused segment. No definite bridging callus or solid incorporation of interbody bone graft identified at this time, however, no periprosthetic lucency identified to suggest motion. No listhesis. Skull base and vertebrae: Craniocervical junction is unremarkable. Atlantodental interval is normal. No acute fracture of the cervical spine. No lytic or blastic bone lesion peer Soft tissues and spinal canal: No prevertebral fluid or swelling. No visible canal hematoma. Disc levels: There is intervertebral disc space narrowing and endplate remodeling at S4-9 and C7-T1 in keeping changes of mild degenerative disc disease. Remaining intervertebral disc heights are preserved. Vertebral body heights are preserved. Spinal canal is widely patent. Prevertebral soft tissues are unremarkable. Review of the axial images demonstrates uncovertebral arthrosis resulting in moderate bilateral neural  foraminal narrowing at C5-6 and C6-7 Upper chest: Mild paraseptal emphysema noted within the lung apices. Other: None IMPRESSION: No acute intracranial injury.  No calvarial fracture. No acute fracture or listhesis of the cervical spine. Status post anterior cervical discectomy and fusion of C5-C7 without definite evidence of solid incorporation of the interbody bone graft. No definite evidence of motion at the fused segment, however. Degenerative changes resulting in moderate neural foraminal narrowing at C5-6 and C6-7 bilaterally. Emphysema (ICD10-J43.9). Electronically Signed  By: Fidela Salisbury MD   On: 09/16/2020 01:01   CT Cervical Spine Wo Contrast  Result Date: 09/16/2020 CLINICAL DATA:  Delirium, substance abuse EXAM: CT HEAD WITHOUT CONTRAST CT CERVICAL SPINE WITHOUT CONTRAST TECHNIQUE: Multidetector CT imaging of the head and cervical spine was performed following the standard protocol without intravenous contrast. Multiplanar CT image reconstructions of the cervical spine were also generated. COMPARISON:  05/01/2020 FINDINGS: CT HEAD FINDINGS Brain: Normal anatomic configuration. No abnormal intra or extra-axial mass lesion or fluid collection. No abnormal mass effect or midline shift. No evidence of acute intracranial hemorrhage or infarct. Ventricular size is normal. Cerebellum unremarkable. Vascular: Unremarkable Skull: Intact Sinuses/Orbits: Paranasal sinuses are clear. Orbits are unremarkable. Other: Mastoid air cells and middle ear cavities are clear. CT CERVICAL SPINE FINDINGS Alignment: Anterior cervical discectomy and fusion procedure of C5-C7 has been performed with instrumentation. Normal alignment across the fused segment. No definite bridging callus or solid incorporation of interbody bone graft identified at this time, however, no periprosthetic lucency identified to suggest motion. No listhesis. Skull base and vertebrae: Craniocervical junction is unremarkable. Atlantodental interval  is normal. No acute fracture of the cervical spine. No lytic or blastic bone lesion peer Soft tissues and spinal canal: No prevertebral fluid or swelling. No visible canal hematoma. Disc levels: There is intervertebral disc space narrowing and endplate remodeling at N6-2 and C7-T1 in keeping changes of mild degenerative disc disease. Remaining intervertebral disc heights are preserved. Vertebral body heights are preserved. Spinal canal is widely patent. Prevertebral soft tissues are unremarkable. Review of the axial images demonstrates uncovertebral arthrosis resulting in moderate bilateral neural foraminal narrowing at C5-6 and C6-7 Upper chest: Mild paraseptal emphysema noted within the lung apices. Other: None IMPRESSION: No acute intracranial injury.  No calvarial fracture. No acute fracture or listhesis of the cervical spine. Status post anterior cervical discectomy and fusion of C5-C7 without definite evidence of solid incorporation of the interbody bone graft. No definite evidence of motion at the fused segment, however. Degenerative changes resulting in moderate neural foraminal narrowing at C5-6 and C6-7 bilaterally. Emphysema (ICD10-J43.9). Electronically Signed   By: Fidela Salisbury MD   On: 09/16/2020 01:01   DG Chest Portable 1 View  Result Date: 09/16/2020 CLINICAL DATA:  Hypoxia, somnolence, altered mental status EXAM: PORTABLE CHEST 1 VIEW COMPARISON:  None. FINDINGS: The heart size and mediastinal contours are within normal limits. Both lungs are clear. The visualized skeletal structures are unremarkable. IMPRESSION: No active disease. Electronically Signed   By: Fidela Salisbury MD   On: 09/16/2020 00:49    Scheduled Meds: . enoxaparin (LOVENOX) injection  40 mg Subcutaneous Q24H  . folic acid  1 mg Oral Daily   Continuous Infusions: . sodium chloride 150 mL/hr at 09/16/20 1305  . vancomycin       LOS: 0 days   Time spent: 25 minutes.  Lorella Nimrod, MD [facility]  If  7PM-7AM, please contact night-coverage [email or web address]  09/16/2020, 1:59 PM   This record has been created using Systems analyst. Errors have been sought and corrected,but may not always be located. Such creation errors do not reflect on the standard of care.

## 2020-09-16 NOTE — Progress Notes (Signed)
Nutrition Brief Note  Patient identified on the Malnutrition Screening Tool (MST) Report  Wt Readings from Last 15 Encounters:  09/16/20 72.8 kg  07/17/20 74.8 kg  07/10/20 70 kg  06/30/20 72.6 kg  05/11/20 69 kg  05/04/20 71.9 kg  12/20/19 71.5 kg  12/06/19 68 kg  11/20/18 81.6 kg  04/17/16 70.3 kg  11/17/14 27.59 kg   34 year old male with PMHx of seizure disorder, CAD, polysubstance abuse admitted with AMS and fall with subsequent head injury likely secondary to polysubstance abuse including EtOH and marijuana, AKI, acute rhabdomyolysis.  Met with patient at bedside. He reports he has a good appetite and intake at home. He lives with his ex-wife, his ex-wife's 79 year old child, and his 24 year old child. Patient reports he is the one who prepares food at home. He reports typically eating 2 large meals per day. He reports eating adequate protein at each meal. Patient reports his appetite is also good here and he is eating well. He had eaten almost 100% of his lunch tray at bedside (just didn't finish all of his broccoli). Patient reports he is weight-stable at approximately 160 lbs and denies any weight loss. RD obtained bed scale weight of 72.8 kg (160.5 lbs). No subcutaneous fat or muscle wasting noted on nutrition-focused physical exam. Patient does not meet criteria for malnutrition at this time.  Body mass index is 21.77 kg/m. Patient meets criteria for normal weight based on current BMI.   Current diet order is heart healthy, patient is consuming approximately 100% of meals at this time. Labs and medications reviewed.   No nutrition interventions warranted at this time. If nutrition issues arise, please consult RD.   Jacklynn Barnacle, MS, RD, LDN Pager number available on Amion

## 2020-09-16 NOTE — Progress Notes (Addendum)
Pharmacy Antibiotic Note  Michael Lester is a 34 y.o. male admitted on 09/15/2020 with sepsis.  Pharmacy has been consulted for Vancomycin , Zosyn  dosing.  Plan: Vancomycin 1750 mg IV X 1 ordered for 11/19 @ 0500. Vancomycin 750 mg IV Q12H ordered to be given on 11/19 @ 1700.  Height: 6' (182.9 cm) Weight: 72.6 kg (160 lb) IBW/kg (Calculated) : 77.6  Temp (24hrs), Avg:97.5 F (36.4 C), Min:97.5 F (36.4 C), Max:97.5 F (36.4 C)  Recent Labs  Lab 09/16/20 0019 09/16/20 0111 09/16/20 0317  WBC 25.8*  --  22.8*  CREATININE 2.22*  --  2.20*  LATICACIDVEN  --  0.8  --     Estimated Creatinine Clearance: 47.7 mL/min (A) (by C-G formula based on SCr of 2.2 mg/dL (H)).    Allergies  Allergen Reactions  . Shellfish Allergy Anaphylaxis    Antimicrobials this admission:   >>    >>   Dose adjustments this admission:   Microbiology results:  BCx:   UCx:    Sputum:    MRSA PCR:   Thank you for allowing pharmacy to be a part of this patient's care.  Tyjuan Demetro D 09/16/2020 4:20 AM

## 2020-09-16 NOTE — H&P (Addendum)
[place]   PATIENT NAME: Michael Lester    MR#:  [id number]  DATE OF BIRTH:  10/14/1986  DATE OF ADMISSION:  09/15/2020  PRIMARY CARE PHYSICIAN: Patient, [name]   REQUESTING/REFERRING PHYSICIAN: Sharyn Creamer, MD  CHIEF COMPLAINT:   Chief Complaint  Patient presents with  . Alcohol Intoxication    HISTORY OF PRESENT ILLNESS:  Michael Lester  is a 34 y.o. Caucasian male with a known history of seizure disorder, coronary artery disease and polysubstance abuse, presented to emergency room with acute onset of altered mental status and fall with subsequent head injury.  Patient was found in the hospital parking lot as he is homeless and was not sure where to go to sleep tonight.  He admitted to using drugs including marijuana and alcohol.  He has a previous history of IV drug abuse in the distant past.  He has been having pruritus with significant itching with no rashes.  He denied any headache or dizziness or paresthesias or focal muscle weakness.  He was fairly somnolent during my interview and was overall very poor historian.  Upon presenting to the emergency room treatment, heart rate was 121 with respiratory to 22 and pulse 70 was 88% on room air and 96% on 2 L of O2 by nasal cannula and later on room air.  Labs labs revealed a BUN of 27 and creatinine of 2.22 compared to 24 and 1.5 on 07/17/2020.  AST was 59 ALT 90.  CK was 840 and high-sensitivity troponin I was 124 with lactic acid of 0.8 and procalcitonin 1.9.  CBC showed significant leukocytosis 25.8 and mild anemia with platelets of 270.  Salicylate level was less than 7 and Tylenol less than 10.  Sed rate was 7 INR is 1.2 with PT 14.5.  Urinalysis was unremarkable and alcohol level was less than 10.  Noncontrasted CT scan revealed no acute intracranial normalities and no calvarial fracture.  C-spine CT showed no fracture or listhesis.  It showed anterior cervical discectomy and fusion of C5-C7 and degenerative changes resulting in  moderate neural foraminal narrowing at C5-C6 and C6-C7 bilaterally. EKG showed sinus tachycardia with rate of 115 with right atrial enlargement and early repolarization.  The patient was given 1 L bolus of IV lactated Ringer and 1 L IV normal saline.  He will be admitted to a medical bed for further evaluation and management. PAST MEDICAL HISTORY:  Polysubstance abuse Seizure disorder Rhabdomyolysis Prior non-STEMI . AMS (altered mental status) 09/16/2020  . Foreign body (FB) in soft tissue   . IVDU (intravenous drug user) 05/02/2020  . Laceration of foot 05/02/2020  . Elevated liver enzymes 05/02/2020  . Elevated troponin 05/02/2020  . Accidental drug overdose   . Non-traumatic rhabdomyolysis   . NSTEMI (non-ST elevated myocardial infarction) (HCC)   . Neck pain 05/01/2020  . Hepatitis C antibody positive in blood 01/21/2020  . Severe sepsis (HCC) 12/30/2019  . Abscess in epidural space of cervical spine 12/21/2019  . Opioid dependence, daily use (HCC) 12/21/2019  . Chronic hepatitis C without hepatic coma (HCC) 12/21/2019  . Osteomyelitis of C6-7 12/20/2019  . Injection of illicit drug within last 12 months 12/20/2019  . Syncope   . Multiple lacerations 11/17/2014    PAST SURGICAL HISTORY:   Past Surgical History:  Procedure Laterality Date  . ANTERIOR CERVICAL DECOMP/DISCECTOMY FUSION N/A 12/22/2019   Procedure: cervical five-six, cervical six-seven anterior cervical discectomy with interbody fusion;  Surgeon: Julio Sicks, MD;  Location: [facility] OR;  Service: Neurosurgery;  Laterality: N/A;  . LACERATION REPAIR N/A 11/16/2014   Procedure: REPAIR MULTIPLE LACERATIONS;  Surgeon: Melvenia Beam, MD;  Location: [facility] OR;  Service: ENT;  Laterality: N/A;  . LACERATION REPAIR Left 11/16/2014   Procedure: REPAIR MULTIPLE LACERATIONS;  Surgeon: Manus Rudd, MD;  Location: [facility] OR;  Service: General;  Laterality: Left;  irrigation and suturing of lacerations on upper body    SOCIAL  HISTORY:   Social History   Tobacco Use  . Smoking status: Current Every Day Smoker    Packs/day: 0.50  . Smokeless tobacco: Never Used  Substance Use Topics  . Alcohol use: Not Currently    FAMILY HISTORY:  He did not recall any familial diseases.  DRUG ALLERGIES:   Allergies  Allergen Reactions  . Shellfish Allergy Anaphylaxis    REVIEW OF SYSTEMS:   ROS As per history of present illness, otherwise unobtainable as the patient was a very poor historian given significant somnolence and altered mental status.   MEDICATIONS AT HOME:   Prior to Admission medications   Medication Sig Start Date End Date Taking? Authorizing Provider  gabapentin (NEURONTIN) 100 MG capsule Take 1 capsule (100 mg total) by mouth 3 (three) times daily. Patient not taking: Reported on 05/01/2020 02/02/20   Leatha Gilding, MD  nicotine (NICODERM CQ - DOSED IN MG/24 HOURS) 14 mg/24hr patch Place 1 patch (14 mg total) onto the skin daily. Patient not taking: Reported on 05/01/2020 02/02/20   Leatha Gilding, MD      VITAL SIGNS:  Blood pressure 135/88, pulse (!) 114, temperature (!) 97.5 F (36.4 C), temperature source Oral, resp. rate 14, height 6' (1.829 m), weight 72.6 kg, SpO2 92 %.  PHYSICAL EXAMINATION:  Physical Exam  GENERAL:  34 y.o.-year-old Caucasian male patient lying in the bed with no acute distress.  EYES: Pupils equal, round, reactive to light and accommodation. No scleral icterus. Extraocular muscles intact.  HEENT: Head atraumatic, normocephalic. Oropharynx with dry mucous membrane and tongue and nasopharynx clear.  NECK:  Supple, no jugular venous distention. No thyroid enlargement, no tenderness.  LUNGS: Normal breath sounds bilaterally, no wheezing, rales,rhonchi or crepitation. No use of accessory muscles of respiration.  CARDIOVASCULAR: Regular rate and rhythm, S1, S2 normal. No murmurs, rubs, or gallops.  ABDOMEN: Soft, nondistended, nontender. Bowel sounds present. No  organomegaly or mass.  EXTREMITIES: No pedal edema, cyanosis, or clubbing.  NEUROLOGIC: Cranial nerves II through XII are intact. Muscle strength 5/5 in all extremities. Sensation intact. Gait not checked.  PSYCHIATRIC: The patient is alert and oriented x 3.  Normal affect and good eye contact. SKIN: No obvious rash, lesion, or ulcer.   LABORATORY PANEL:   CBC Recent Labs  Lab 09/16/20 0019  WBC 25.8*  HGB 11.0*  HCT 34.9*  PLT 270   ------------------------------------------------------------------------------------------------------------------  Chemistries  Recent Labs  Lab 09/16/20 0019  NA 140  K 4.2  CL 105  CO2 22  GLUCOSE 153*  BUN 27*  CREATININE 2.22*  CALCIUM 9.0  AST 59*  ALT 90*  ALKPHOS 117  BILITOT 0.7   ------------------------------------------------------------------------------------------------------------------  Cardiac Enzymes No results for input(s): TROPONINI in the last 168 hours. ------------------------------------------------------------------------------------------------------------------  RADIOLOGY:  CT HEAD WO CONTRAST  Result Date: 09/16/2020 CLINICAL DATA:  Delirium, substance abuse EXAM: CT HEAD WITHOUT CONTRAST CT CERVICAL SPINE WITHOUT CONTRAST TECHNIQUE: Multidetector CT imaging of the head and cervical spine was performed following the standard protocol without intravenous contrast. Multiplanar CT image  reconstructions of the cervical spine were also generated. COMPARISON:  05/01/2020 FINDINGS: CT HEAD FINDINGS Brain: Normal anatomic configuration. No abnormal intra or extra-axial mass lesion or fluid collection. No abnormal mass effect or midline shift. No evidence of acute intracranial hemorrhage or infarct. Ventricular size is normal. Cerebellum unremarkable. Vascular: Unremarkable Skull: Intact Sinuses/Orbits: Paranasal sinuses are clear. Orbits are unremarkable. Other: Mastoid air cells and middle ear cavities are clear. CT  CERVICAL SPINE FINDINGS Alignment: Anterior cervical discectomy and fusion procedure of C5-C7 has been performed with instrumentation. Normal alignment across the fused segment. No definite bridging callus or solid incorporation of interbody bone graft identified at this time, however, no periprosthetic lucency identified to suggest motion. No listhesis. Skull base and vertebrae: Craniocervical junction is unremarkable. Atlantodental interval is normal. No acute fracture of the cervical spine. No lytic or blastic bone lesion peer Soft tissues and spinal canal: No prevertebral fluid or swelling. No visible canal hematoma. Disc levels: There is intervertebral disc space narrowing and endplate remodeling at C4-5 and C7-T1 in keeping changes of mild degenerative disc disease. Remaining intervertebral disc heights are preserved. Vertebral body heights are preserved. Spinal canal is widely patent. Prevertebral soft tissues are unremarkable. Review of the axial images demonstrates uncovertebral arthrosis resulting in moderate bilateral neural foraminal narrowing at C5-6 and C6-7 Upper chest: Mild paraseptal emphysema noted within the lung apices. Other: None IMPRESSION: No acute intracranial injury.  No calvarial fracture. No acute fracture or listhesis of the cervical spine. Status post anterior cervical discectomy and fusion of C5-C7 without definite evidence of solid incorporation of the interbody bone graft. No definite evidence of motion at the fused segment, however. Degenerative changes resulting in moderate neural foraminal narrowing at C5-6 and C6-7 bilaterally. Emphysema (ICD10-J43.9). Electronically Signed   By: Helyn NumbersAshesh  Parikh MD   On: 09/16/2020 01:01   CT Cervical Spine Wo Contrast  Result Date: 09/16/2020 CLINICAL DATA:  Delirium, substance abuse EXAM: CT HEAD WITHOUT CONTRAST CT CERVICAL SPINE WITHOUT CONTRAST TECHNIQUE: Multidetector CT imaging of the head and cervical spine was performed following the  standard protocol without intravenous contrast. Multiplanar CT image reconstructions of the cervical spine were also generated. COMPARISON:  05/01/2020 FINDINGS: CT HEAD FINDINGS Brain: Normal anatomic configuration. No abnormal intra or extra-axial mass lesion or fluid collection. No abnormal mass effect or midline shift. No evidence of acute intracranial hemorrhage or infarct. Ventricular size is normal. Cerebellum unremarkable. Vascular: Unremarkable Skull: Intact Sinuses/Orbits: Paranasal sinuses are clear. Orbits are unremarkable. Other: Mastoid air cells and middle ear cavities are clear. CT CERVICAL SPINE FINDINGS Alignment: Anterior cervical discectomy and fusion procedure of C5-C7 has been performed with instrumentation. Normal alignment across the fused segment. No definite bridging callus or solid incorporation of interbody bone graft identified at this time, however, no periprosthetic lucency identified to suggest motion. No listhesis. Skull base and vertebrae: Craniocervical junction is unremarkable. Atlantodental interval is normal. No acute fracture of the cervical spine. No lytic or blastic bone lesion peer Soft tissues and spinal canal: No prevertebral fluid or swelling. No visible canal hematoma. Disc levels: There is intervertebral disc space narrowing and endplate remodeling at C4-5 and C7-T1 in keeping changes of mild degenerative disc disease. Remaining intervertebral disc heights are preserved. Vertebral body heights are preserved. Spinal canal is widely patent. Prevertebral soft tissues are unremarkable. Review of the axial images demonstrates uncovertebral arthrosis resulting in moderate bilateral neural foraminal narrowing at C5-6 and C6-7 Upper chest: Mild paraseptal emphysema noted within the lung apices. Other: None  IMPRESSION: No acute intracranial injury.  No calvarial fracture. No acute fracture or listhesis of the cervical spine. Status post anterior cervical discectomy and fusion of  C5-C7 without definite evidence of solid incorporation of the interbody bone graft. No definite evidence of motion at the fused segment, however. Degenerative changes resulting in moderate neural foraminal narrowing at C5-6 and C6-7 bilaterally. Emphysema (ICD10-J43.9). Electronically Signed   By: Helyn Numbers MD   On: 09/16/2020 01:01   DG Chest Portable 1 View  Result Date: 09/16/2020 CLINICAL DATA:  Hypoxia, somnolence, altered mental status EXAM: PORTABLE CHEST 1 VIEW COMPARISON:  None. FINDINGS: The heart size and mediastinal contours are within normal limits. Both lungs are clear. The visualized skeletal structures are unremarkable. IMPRESSION: No active disease. Electronically Signed   By: Helyn Numbers MD   On: 09/16/2020 00:49      IMPRESSION AND PLAN:   1.  Altered mental status and fall with subsequent head injury likely secondary to polysubstance abuse including alcohol and marijuana.  The patient has significant leukocytosis with elevated lactic acid and procalcitonin and given his tachypnea and tachycardia he has SIRS and could be having sepsis and given altered mental status severe sepsis. -The patient will be admitted to a medically monitored bed. -We will follow neuro checks every 4 hours for 24 hours. -We will continue hydration with IV normal saline. -We will obtain blood cultures and empirically place him on IV vancomycin and Zosyn. -Physical therapy evaluation will be obtained. -He will be watched for alcohol withdrawal. -He will be placed on a banana bag daily and as needed IV Ativan.  2.  Acute kidney injury. -This is likely prerenal secondary to volume depletion from diminished p.o. intake. -He will be hydrated with IV normal saline and will follow his renal functions.  3.  Acute rhabdomyolysis, likely related to his fall. -The patient will be hydrated and CK will be followed.  4.  Acute hypoxic respiratory failure. -This like secondary to polysubstance abuse  and significant somnolence with poor respiratory effort. -O2 protocol will be followed. -We will check coagulation profile and obtain a VQ scan.  5.  DVT prophylaxis. -Subcutaneous Lovenox.  All the records are reviewed and case discussed with ED provider. The plan of care was discussed in details with the patient (and family). I answered all questions. The patient agreed to proceed with the above mentioned plan. Further management will depend upon hospital course.   CODE STATUS: Full code  Status is: Inpatient  Remains inpatient appropriate because:Altered mental status, Ongoing diagnostic testing needed not appropriate for outpatient work up, Unsafe d/c plan, IV treatments appropriate due to intensity of illness or inability to take PO and Inpatient level of care appropriate due to severity of illness   Dispo: The patient is from: Home              Anticipated d/c is to: Home              Anticipated d/c date is: 2 days              Patient currently is not medically stable to d/c.   TOTAL TIME TAKING CARE OF THIS PATIENT: 55 minutes.    Hannah Beat M.D on 09/16/2020 at 1:55 AM  [facility]   From 7 PM-7 AM, contact night-coverage [email or web address]  CC: Primary care physician; Patient, [name]

## 2020-09-16 NOTE — Consult Note (Signed)
[facility] Face-to-Face Psychiatry Consult   Reason for Consult:   Consult for this 34 year old man with a history of substance abuse brought into the emergency room with altered mental status Referring Physician:  Nelson Chimes Patient Identification: MARQUE RADEMAKER MRN:  [id number] Principal Diagnosis: AMS (altered mental status) Diagnosis:  Principal Problem:   AMS (altered mental status)   Total Time spent with patient: 1 hour  Subjective:   KINSER FELLMAN is a 34 y.o. male patient admitted with patient never was able to give a coherent answer.  HPI: Patient seen chart reviewed.  34 year old man brought to the emergency room last night by Probation officer or police officers.  Evidently he had a fall somewhere in public and struck his head.  Patient was described as confused and seemed to appear intoxication on presentation.  There was concern that he had been drinking.  Patient has denied recent consumption of alcohol.  Admits to use of marijuana.  He is not able to give much in the way of other detailed history.  He vaguely denies any other drug use although when I ask him in general if he had been using any drugs recently he said on more than one occasion yes.  His history is really not reliable as he remains confused with waxing and waning mental state.  During the initial conversation the patient would appear to rouse himself enough to try to answer a question for a few seconds and then would fall back onto the bed snoring with his mouth open.  Some of this may have been intentional behavior to avoid the conversation it is really hard to tell.  Midway through our talk someone called him on the telephone and he was a little more able to sustain himself during that conversation but he was still incoherent in his answers.  Alcohol level was negative.  Drug screen was only positive for cannabis.  Patient is not reporting any specific physical symptoms.  He is somewhat restless in bed itching himself frequently very  constricted pupils noted.  Past Psychiatric History: Patient has a history of multiple presentations to the emergency room usually for problems directly or indirectly related to substance abuse.  History of IV drug abuse in the past.  It does not appear that psychiatry is ever been consulted or that there is ever been any particular referral to substance abuse or mental health treatment.  Patient is not currently able to answer any questions about past history.  Risk to Self:   Risk to Others:   Prior Inpatient Therapy:   Prior Outpatient Therapy:    Past Medical History: History reviewed. No pertinent past medical history.  Past Surgical History:  Procedure Laterality Date  . ANTERIOR CERVICAL DECOMP/DISCECTOMY FUSION N/A 12/22/2019   Procedure: cervical five-six, cervical six-seven anterior cervical discectomy with interbody fusion;  Surgeon: Julio Sicks, MD;  Location: [facility] OR;  Service: Neurosurgery;  Laterality: N/A;  . LACERATION REPAIR N/A 11/16/2014   Procedure: REPAIR MULTIPLE LACERATIONS;  Surgeon: Melvenia Beam, MD;  Location: [facility] OR;  Service: ENT;  Laterality: N/A;  . LACERATION REPAIR Left 11/16/2014   Procedure: REPAIR MULTIPLE LACERATIONS;  Surgeon: Manus Rudd, MD;  Location: [facility] OR;  Service: General;  Laterality: Left;  irrigation and suturing of lacerations on upper body   Family History: History reviewed. No pertinent family history. Family Psychiatric  History: Unknown Social History:  Social History   Substance and Sexual Activity  Alcohol Use Not Currently     Social History  Substance and Sexual Activity  Drug Use Yes  . Types: Marijuana, Heroin, Cocaine   Comment: heroin and fentanyl    Social History   Socioeconomic History  . Marital status: Divorced    Spouse name: Not on file  . Number of children: Not on file  . Years of education: Not on file  . Highest education level: Not on file  Occupational History  . Not on file  Tobacco Use  . Smoking  status: Current Every Day Smoker    Packs/day: 0.50  . Smokeless tobacco: Never Used  Vaping Use  . Vaping Use: Never used  Substance and Sexual Activity  . Alcohol use: Not Currently  . Drug use: Yes    Types: Marijuana, Heroin, Cocaine    Comment: heroin and fentanyl  . Sexual activity: Not on file  Other Topics Concern  . Not on file  Social History Narrative  . Not on file   Social Determinants of Health   Financial Resource Strain:   . Difficulty of Paying Living Expenses: Not on file  Food Insecurity:   . Worried About Programme researcher, broadcasting/film/video in the Last Year: Not on file  . Ran Out of Food in the Last Year: Not on file  Transportation Needs:   . Lack of Transportation (Medical): Not on file  . Lack of Transportation (Non-Medical): Not on file  Physical Activity:   . Days of Exercise per Week: Not on file  . Minutes of Exercise per Session: Not on file  Stress:   . Feeling of Stress : Not on file  Social Connections:   . Frequency of Communication with Friends and Family: Not on file  . Frequency of Social Gatherings with Friends and Family: Not on file  . Attends Religious Services: Not on file  . Active Member of Clubs or Organizations: Not on file  . Attends Banker Meetings: Not on file  . Marital Status: Not on file   Additional Social History:    Allergies:   Allergies  Allergen Reactions  . Shellfish Allergy Anaphylaxis    Labs:  Results for orders placed or performed during the hospital encounter of 09/15/20 (from the past 48 hour(s))  Comprehensive metabolic panel     Status: Abnormal   Collection Time: 09/16/20 12:19 AM  Result Value Ref Range   Sodium 140 135 - 145 mmol/L   Potassium 4.2 3.5 - 5.1 mmol/L   Chloride 105 98 - 111 mmol/L   CO2 22 22 - 32 mmol/L   Glucose, Bld 153 (H) 70 - 99 mg/dL    Comment: Glucose reference range applies only to samples taken after fasting for at least 8 hours.   BUN 27 (H) 6 - 20 mg/dL    Creatinine, Ser 5.40 (H) 0.61 - 1.24 mg/dL   Calcium 9.0 8.9 - 08.6 mg/dL   Total Protein 8.5 (H) 6.5 - 8.1 g/dL   Albumin 4.6 3.5 - 5.0 g/dL   AST 59 (H) 15 - 41 U/L   ALT 90 (H) 0 - 44 U/L   Alkaline Phosphatase 117 38 - 126 U/L   Total Bilirubin 0.7 0.3 - 1.2 mg/dL   GFR, Estimated 38 (L) >60 mL/min    Comment: (NOTE) Calculated using the CKD-EPI Creatinine Equation (2021)    Anion gap 13 5 - 15    Comment: Performed at [facility], [address] [place], [place] [address]  Salicylate level     Status: Abnormal  Collection Time: 09/16/20 12:19 AM  Result Value Ref Range   Salicylate Lvl <7.0 (L) 7.0 - 30.0 mg/dL    Comment: Performed at [facility], [address] [place], [place] [address]  Acetaminophen level     Status: Abnormal   Collection Time: 09/16/20 12:19 AM  Result Value Ref Range   Acetaminophen (Tylenol), Serum <10 (L) 10 - 30 ug/mL    Comment: (NOTE) Therapeutic concentrations vary significantly. A range of 10-30 ug/mL  may be an effective concentration for many patients. However, some  are best treated at concentrations outside of this range. Acetaminophen concentrations >150 ug/mL at 4 hours after ingestion  and >50 ug/mL at 12 hours after ingestion are often associated with  toxic reactions.  Performed at [facility], [address] [place], [place] [address]   Ethanol     Status: None   Collection Time: 09/16/20 12:19 AM  Result Value Ref Range   Alcohol, Ethyl (B) <10 <10 mg/dL    Comment: (NOTE) Lowest detectable limit for serum alcohol is 10 mg/dL.  For medical purposes only. Performed at [facility], [address] [place], [place] [address]   CBC WITH DIFFERENTIAL     Status: Abnormal   Collection Time: 09/16/20 12:19 AM  Result Value Ref Range   WBC 25.8 (H) 4.0 - 10.5 K/uL   RBC 4.23 4.22 - 5.81 MIL/uL   Hemoglobin 11.0 (L) 13.0 - 17.0 g/dL   HCT 84.1 (L) 39 - 52 %   MCV 82.5 80.0 - 100.0  fL   MCH 26.0 26.0 - 34.0 pg   MCHC 31.5 30.0 - 36.0 g/dL   RDW 32.4 (H) 40.1 - 02.7 %   Platelets 270 150 - 400 K/uL   nRBC 0.0 0.0 - 0.2 %   Neutrophils Relative % 87 %   Neutro Abs 22.5 (H) 1.7 - 7.7 K/uL   Lymphocytes Relative 5 %   Lymphs Abs 1.2 0.7 - 4.0 K/uL   Monocytes Relative 7 %   Monocytes Absolute 1.7 (H) 0.1 - 1.0 K/uL   Eosinophils Relative 0 %   Eosinophils Absolute 0.0 0.0 - 0.5 K/uL   Basophils Relative 0 %   Basophils Absolute 0.1 0.0 - 0.1 K/uL   WBC Morphology MORPHOLOGY UNREMARKABLE    RBC Morphology MORPHOLOGY UNREMARKABLE    Smear Review Normal platelet morphology    Immature Granulocytes 1 %   Abs Immature Granulocytes 0.32 (H) 0.00 - 0.07 K/uL    Comment: Performed at [facility], [address] [place], [place] [address]  CK     Status: Abnormal   Collection Time: 09/16/20 12:19 AM  Result Value Ref Range   Total CK 840 (H) 49.0 - 397.0 U/L    Comment: Performed at [facility], [address] [place], [place] [address]  Troponin I (High Sensitivity)     Status: Abnormal   Collection Time: 09/16/20 12:19 AM  Result Value Ref Range   Troponin I (High Sensitivity) 124 (HH) <18 ng/L    Comment: CRITICAL RESULT CALLED TO, READ BACK BY AND VERIFIED WITH Berline Lopes [facility] RN [id number] 09/16/20 HNM (NOTE) Elevated high sensitivity troponin I (hsTnI) values and significant  changes across serial measurements may suggest ACS but many other  chronic and acute conditions are known to elevate hsTnI results.  Refer to the "Links" section for chest pain algorithms and additional  guidance. Performed at [facility], [address], [place], [place] [address]  Sedimentation rate     Status: None   Collection Time: 09/16/20 12:19 AM  Result Value Ref Range   Sed Rate 7 0 - 15 mm/hr    Comment: Performed at [facility], [address] [place], [place] [address]  Protime-INR     Status: None   Collection Time: 09/16/20  12:19 AM  Result Value Ref Range   Prothrombin Time 14.5 11.4 - 15.2 seconds   INR 1.2 0.8 - 1.2    Comment: (NOTE) INR goal varies based on device and disease states. Performed at [facility], [address] [place], [place] [address]   Procalcitonin - Baseline     Status: None   Collection Time: 09/16/20 12:19 AM  Result Value Ref Range   Procalcitonin 1.90 ng/mL    Comment:        Interpretation: PCT > 0.5 ng/mL and <= 2 ng/mL: Systemic infection (sepsis) is possible, but other conditions are known to elevate PCT as well. (NOTE)       Sepsis PCT Algorithm           Lower Respiratory Tract                                      Infection PCT Algorithm    ----------------------------     ----------------------------         PCT < 0.25 ng/mL                PCT < 0.10 ng/mL          Strongly encourage             Strongly discourage   discontinuation of antibiotics    initiation of antibiotics    ----------------------------     -----------------------------       PCT 0.25 - 0.50 ng/mL            PCT 0.10 - 0.25 ng/mL               OR       >80% decrease in PCT            Discourage initiation of                                            antibiotics      Encourage discontinuation           of antibiotics    ----------------------------     -----------------------------         PCT >= 0.50 ng/mL              PCT 0.26 - 0.50 ng/mL                AND       <80% decrease in PCT             Encourage initiation of                                             antibiotics       Encourage continuation           of antibiotics    ----------------------------     -----------------------------  PCT >= 0.50 ng/mL                  PCT > 0.50 ng/mL               AND         increase in PCT                  Strongly encourage                                      initiation of antibiotics    Strongly encourage escalation           of antibiotics                                      -----------------------------                                           PCT <= 0.25 ng/mL                                                 OR                                        > 80% decrease in PCT                                      Discontinue / Do not initiate                                             antibiotics  Performed at [facility], [address] [place], [place] [address]   Lactic acid, plasma     Status: None   Collection Time: 09/16/20  1:11 AM  Result Value Ref Range   Lactic Acid, Venous 0.8 0.5 - 1.9 mmol/L    Comment: Performed at [facility], [address], [place], [place] [address]  Blood Culture (routine x 2)     Status: None (Preliminary result)   Collection Time: 09/16/20  1:33 AM   Specimen: BLOOD  Result Value Ref Range   Specimen Description BLOOD BLOOD LEFT FOREARM    Special Requests      BOTTLES DRAWN AEROBIC AND ANAEROBIC Blood Culture results may not be optimal due to an excessive volume of blood received in culture bottles   Culture      NO GROWTH < 12 HOURS Performed at [facility], [address] [place], [place] [address]    Report Status PENDING   Blood Culture (routine x 2)     Status: None (Preliminary result)   Collection Time: 09/16/20  1:35 AM   Specimen: BLOOD  Result Value Ref Range   Specimen Description BLOOD BLOOD RIGHT WRIST  Special Requests      BOTTLES DRAWN AEROBIC AND ANAEROBIC Blood Culture results may not be optimal due to an excessive volume of blood received in culture bottles   Culture      NO GROWTH < 12 HOURS Performed at [facility], [address] [place], [place] [address]    Report Status PENDING   Basic metabolic panel     Status: Abnormal   Collection Time: 09/16/20  3:17 AM  Result Value Ref Range   Sodium 136 135 - 145 mmol/L   Potassium 4.6 3.5 - 5.1 mmol/L   Chloride 104 98 - 111 mmol/L   CO2 21 (L) 22 - 32 mmol/L   Glucose, Bld 139  (H) 70 - 99 mg/dL    Comment: Glucose reference range applies only to samples taken after fasting for at least 8 hours.   BUN 30 (H) 6 - 20 mg/dL   Creatinine, Ser 4.27 (H) 0.61 - 1.24 mg/dL   Calcium 8.6 (L) 8.9 - 10.3 mg/dL   GFR, Estimated 39 (L) >60 mL/min    Comment: (NOTE) Calculated using the CKD-EPI Creatinine Equation (2021)    Anion gap 11 5 - 15    Comment: Performed at [facility], [address] [place], [place] [address]  CBC     Status: Abnormal   Collection Time: 09/16/20  3:17 AM  Result Value Ref Range   WBC 22.8 (H) 4.0 - 10.5 K/uL   RBC 3.89 (L) 4.22 - 5.81 MIL/uL   Hemoglobin 10.0 (L) 13.0 - 17.0 g/dL   HCT 62.8 (L) 39 - 52 %   MCV 82.8 80.0 - 100.0 fL   MCH 25.7 (L) 26.0 - 34.0 pg   MCHC 31.1 30.0 - 36.0 g/dL   RDW 31.5 (H) 17.6 - 16.0 %   Platelets 245 150 - 400 K/uL   nRBC 0.0 0.0 - 0.2 %    Comment: Performed at [facility], [address] [place], [place] [address]  CK     Status: Abnormal   Collection Time: 09/16/20  3:17 AM  Result Value Ref Range   Total CK 1,636 (H) 49.0 - 397.0 U/L    Comment: Performed at [facility], [address] [place], [place] [address]  CBC with Differential     Status: Abnormal   Collection Time: 09/16/20  5:12 AM  Result Value Ref Range   WBC 19.9 (H) 4.0 - 10.5 K/uL   RBC 3.86 (L) 4.22 - 5.81 MIL/uL   Hemoglobin 10.0 (L) 13.0 - 17.0 g/dL   HCT 85.4 (L) 39 - 52 %   MCV 82.9 80.0 - 100.0 fL   MCH 25.9 (L) 26.0 - 34.0 pg   MCHC 31.3 30.0 - 36.0 g/dL   RDW 62.7 (H) 03.5 - 00.9 %   Platelets 227 150 - 400 K/uL   nRBC 0.0 0.0 - 0.2 %   Neutrophils Relative % 82 %   Neutro Abs 16.4 (H) 1.7 - 7.7 K/uL   Lymphocytes Relative 10 %   Lymphs Abs 2.0 0.7 - 4.0 K/uL   Monocytes Relative 7 %   Monocytes Absolute 1.4 (H) 0.1 - 1.0 K/uL   Eosinophils Relative 0 %   Eosinophils Absolute 0.0 0.0 - 0.5 K/uL   Basophils Relative 0 %   Basophils Absolute 0.1 0.0 - 0.1 K/uL   Immature  Granulocytes 1 %   Abs Immature Granulocytes 0.16 (H) 0.00 - 0.07 K/uL    Comment:  Performed at [facility], [address] [place], [place] [address]  Comprehensive metabolic panel     Status: Abnormal   Collection Time: 09/16/20  5:12 AM  Result Value Ref Range   Sodium 139 135 - 145 mmol/L   Potassium 4.8 3.5 - 5.1 mmol/L   Chloride 105 98 - 111 mmol/L   CO2 25 22 - 32 mmol/L   Glucose, Bld 93 70 - 99 mg/dL    Comment: Glucose reference range applies only to samples taken after fasting for at least 8 hours.   BUN 28 (H) 6 - 20 mg/dL   Creatinine, Ser 6.04 (H) 0.61 - 1.24 mg/dL   Calcium 8.5 (L) 8.9 - 10.3 mg/dL   Total Protein 7.9 6.5 - 8.1 g/dL   Albumin 4.2 3.5 - 5.0 g/dL   AST 77 (H) 15 - 41 U/L   ALT 86 (H) 0 - 44 U/L   Alkaline Phosphatase 98 38 - 126 U/L   Total Bilirubin 0.7 0.3 - 1.2 mg/dL   GFR, Estimated 43 (L) >60 mL/min    Comment: (NOTE) Calculated using the CKD-EPI Creatinine Equation (2021)    Anion gap 9 5 - 15    Comment: Performed at [facility], [address] [place], [place] [address]  Lactic acid, plasma     Status: None   Collection Time: 09/16/20  5:12 AM  Result Value Ref Range   Lactic Acid, Venous 1.5 0.5 - 1.9 mmol/L    Comment: Performed at [facility], [address] [place], [place] [address]  Protime-INR     Status: None   Collection Time: 09/16/20  5:12 AM  Result Value Ref Range   Prothrombin Time 14.6 11.4 - 15.2 seconds   INR 1.2 0.8 - 1.2    Comment: (NOTE) INR goal varies based on device and disease states. Performed at [facility], [address] [place], [place] [address]   APTT     Status: None   Collection Time: 09/16/20  5:12 AM  Result Value Ref Range   aPTT 29 24 - 36 seconds    Comment: Performed at [facility], [address] [place], [place] [address]  CK     Status: Abnormal   Collection Time: 09/16/20  8:04 AM  Result Value Ref Range   Total CK 2,946 (H)  49.0 - 397.0 U/L    Comment: Performed at [facility], [address] [place], [place] [address]  Magnesium     Status: Abnormal   Collection Time: 09/16/20  8:04 AM  Result Value Ref Range   Magnesium 3.1 (H) 1.7 - 2.4 mg/dL    Comment: Performed at [facility], [address], [place], [place] [address]  Phosphorus     Status: Abnormal   Collection Time: 09/16/20  8:04 AM  Result Value Ref Range   Phosphorus 6.8 (H) 2.5 - 4.6 mg/dL    Comment: Performed at [facility], [address] [place], [place] [address]  Urine Drug Screen, Qualitative     Status: Abnormal   Collection Time: 09/16/20  9:20 AM  Result Value Ref Range   Tricyclic, Ur Screen NONE DETECTED NONE DETECTED   Amphetamines, Ur Screen NONE DETECTED NONE DETECTED   MDMA (Ecstasy)Ur Screen NONE DETECTED NONE DETECTED   Cocaine Metabolite,Ur [place] NONE DETECTED NONE DETECTED   Opiate, Ur Screen NONE DETECTED NONE DETECTED   Phencyclidine (PCP) Ur S NONE DETECTED NONE DETECTED  Cannabinoid 50 Ng, Ur [place] POSITIVE (A) NONE DETECTED   Barbiturates, Ur Screen NONE DETECTED NONE DETECTED   Benzodiazepine, Ur Scrn NONE DETECTED NONE DETECTED   Methadone Scn, Ur NONE DETECTED NONE DETECTED    Comment: (NOTE) Tricyclics + metabolites, urine    Cutoff 1000 ng/mL Amphetamines + metabolites, urine  Cutoff 1000 ng/mL MDMA (Ecstasy), urine              Cutoff 500 ng/mL Cocaine Metabolite, urine          Cutoff 300 ng/mL Opiate + metabolites, urine        Cutoff 300 ng/mL Phencyclidine (PCP), urine         Cutoff 25 ng/mL Cannabinoid, urine                 Cutoff 50 ng/mL Barbiturates + metabolites, urine  Cutoff 200 ng/mL Benzodiazepine, urine              Cutoff 200 ng/mL Methadone, urine                   Cutoff 300 ng/mL  The urine drug screen provides only a preliminary, unconfirmed analytical test result and should not be used for non-medical purposes. Clinical consideration and professional  judgment should be applied to any positive drug screen result due to possible interfering substances. A more specific alternate chemical method must be used in order to obtain a confirmed analytical result. Gas chromatography / mass spectrometry (GC/MS) is the preferred confirm atory method. Performed at [facility], [address] [place], [place] [address]   Urinalysis, Complete w Microscopic     Status: Abnormal   Collection Time: 09/16/20  9:20 AM  Result Value Ref Range   Color, Urine YELLOW (A) YELLOW   APPearance CLOUDY (A) CLEAR   Specific Gravity, Urine 1.012 1.005 - 1.030   pH 5.0 5.0 - 8.0   Glucose, UA NEGATIVE NEGATIVE mg/dL   Hgb urine dipstick NEGATIVE NEGATIVE   Bilirubin Urine NEGATIVE NEGATIVE   Ketones, ur NEGATIVE NEGATIVE mg/dL   Protein, ur NEGATIVE NEGATIVE mg/dL   Nitrite NEGATIVE NEGATIVE   Leukocytes,Ua NEGATIVE NEGATIVE   RBC / HPF 0-5 0 - 5 RBC/hpf   WBC, UA 0-5 0 - 5 WBC/hpf   Bacteria, UA NONE SEEN NONE SEEN   Squamous Epithelial / LPF 0-5 0 - 5   Mucus PRESENT    Uric Acid Crys, UA PRESENT     Comment: Performed at [facility], [address], [place], [place] [address]    Current Facility-Administered Medications  Medication Dose Route Frequency Provider Last Rate Last Admin  . 0.9 %  sodium chloride infusion   Intravenous Continuous Arnetha Courser, MD 150 mL/hr at 09/16/20 1305 New Bag at 09/16/20 1305  . acetaminophen (TYLENOL) tablet 650 mg  650 mg Oral Q6H PRN Mansy, Vernetta Honey, MD       Or  . acetaminophen (TYLENOL) suppository 650 mg  650 mg Rectal Q6H PRN Mansy, Jan A, MD      . cefTRIAXone (ROCEPHIN) 1 g in sodium chloride 0.9 % 100 mL IVPB  1 g Intravenous Q24H Arnetha Courser, MD      . enoxaparin (LOVENOX) injection 40 mg  40 mg Subcutaneous Q24H Mansy, Jan A, MD   40 mg at 09/16/20 0949  . folic acid (FOLVITE) tablet 1 mg  1 mg Oral Daily Manuela Schwartz, NP   1 mg at 09/16/20 0949  . LORazepam (ATIVAN) tablet  1-4 mg   1-4 mg Oral Q1H PRN Manuela Schwartz, NP   1 mg at 09/16/20 1239   Or  . LORazepam (ATIVAN) injection 1-4 mg  1-4 mg Intravenous Q1H PRN Manuela Schwartz, NP   2 mg at 09/16/20 1610  . magnesium hydroxide (MILK OF MAGNESIA) suspension 30 mL  30 mL Oral Daily PRN Mansy, Jan A, MD      . ondansetron [facility]) tablet 4 mg  4 mg Oral Q6H PRN Mansy, Jan A, MD       Or  . ondansetron [facility]) injection 4 mg  4 mg Intravenous Q6H PRN Mansy, Jan A, MD      . thiamine tablet 100 mg  100 mg Oral Daily Arnetha Courser, MD      . traZODone (DESYREL) tablet 25 mg  25 mg Oral QHS PRN Mansy, Jan A, MD      . vancomycin (VANCOREADY) IVPB 750 mg/150 mL  750 mg Intravenous Q12H Mansy, Vernetta Honey, MD        Musculoskeletal: Strength & Muscle Tone: decreased Gait & Station: unsteady Patient leans: N/A  Psychiatric Specialty Exam: Physical Exam Vitals and nursing note reviewed.  Constitutional:      Appearance: He is well-developed. He is toxic-appearing.  HENT:     Head: Normocephalic and atraumatic.  Eyes:     Conjunctiva/sclera: Conjunctivae normal.     Pupils: Pupils are equal, round, and reactive to light.  Cardiovascular:     Heart sounds: Normal heart sounds.  Pulmonary:     Effort: Pulmonary effort is normal.  Abdominal:     Palpations: Abdomen is soft.  Musculoskeletal:        General: Normal range of motion.     Cervical back: Normal range of motion.  Skin:    General: Skin is warm and dry.  Psychiatric:        Attention and Perception: He is inattentive.        Mood and Affect: Affect is blunt.        Speech: He is noncommunicative. Speech is tangential.        Behavior: Behavior is slowed and withdrawn.        Thought Content: Thought content is not paranoid. Thought content does not include homicidal or suicidal ideation.        Cognition and Memory: Cognition is impaired. Memory is impaired.        Judgment: Judgment is impulsive and inappropriate.     Review of Systems   Constitutional: Negative.   HENT: Negative.   Eyes: Negative.   Respiratory: Negative.   Cardiovascular: Negative.   Gastrointestinal: Negative.   Musculoskeletal: Negative.   Skin: Negative.   Neurological: Negative.   Psychiatric/Behavioral: Positive for decreased concentration.    Blood pressure 109/73, pulse 97, temperature 98.1 F (36.7 C), temperature source Oral, resp. rate 16, height 6' (1.829 m), weight 72.8 kg, SpO2 97 %.Body mass index is 21.77 kg/m.  General Appearance: Disheveled  Eye Contact:  Minimal  Speech:  Garbled and Slow  Volume:  Decreased  Mood:  Dysphoric  Affect:  Constricted and Inappropriate  Thought Process:  Disorganized  Orientation:  Negative  Thought Content:  Illogical and Rumination  Suicidal Thoughts:  No  Homicidal Thoughts:  No  Memory:  Immediate;   Poor  Judgement:  Impaired  Insight:  Lacking  Psychomotor Activity:  Decreased  Concentration:  Concentration: Poor  Recall:  Poor  Fund of Knowledge:  Poor  Language:  Poor  Akathisia:  Negative  Handed:  Right  AIMS (if indicated):     Assets:  Desire for Improvement  ADL's:  Impaired  Cognition:  Impaired,  Mild  Sleep:        Treatment Plan Summary: Plan 34 year old man came into the hospital with altered mental status not entirely clear what is going on.  Had a head injury and could be having some postconcussive confusion but the CT scan did not show any acute injury requiring any surgical intervention.  Patient admits to cannabis use but denies other drug use.  Everything about his clinical presentation with his nodding in and out, the itching, the constricted pupils all looks very consistent with narcotic abuse.  Patient has a history of narcotic abuse in the past.Ockham's razor would certainly suggest that narcotic intoxication still be the likely explanation.  There are several drugs that do not show up on our drug screen.  Buprenorphine does not show up on the drug screen.   Fentanyl does not, oxycodone usually does not.  Also clonazepam often does not show up on the drug screen.  Patient does not look as though he is in DTs and I would not suggest treating him as such.  I can add some orders for as needed antipsychotics for agitation or confusion with sundowning but I would expect that with conservative treatment like he is getting now he will probably continue to improve back to his baseline over the next day.  Call for further consultation if needed.  Once he is back to his usual level of lucidity he should be encouraged to discontinue substance abuse.     Disposition: No evidence of imminent risk to self or others at present.   Patient does not meet criteria for psychiatric inpatient admission. Supportive therapy provided about ongoing stressors.  Mordecai RasmussenJohn Nehal Shives, MD 09/16/2020 2:58 PM

## 2020-09-16 NOTE — TOC Initial Note (Addendum)
Transition of Care [facility]) - Initial/Assessment Note    Patient Details  Name: Michael Lester MRN: [id number] Date of Birth: Mar 23, 1986  Transition of Care [facility]) CM/SW Contact:    Liliana Cline, LCSW Phone Number: 09/16/2020, 11:21 AM  Clinical Narrative:                TOC consulted for SA resources. Patient also uninsured. CSW attempted to complete Readmission Screening and talk with patient about resources. Patient answered questions but alertness waxing and waning. Patient reported he was living with his ex wife and 73 year old. He reported he has friends who drive him to appointments. No PCP. When asked about Pharmacy patient said "just whatever is in town." Started to discuss SA resources and patient not fully engaged. CSW left handoff to ask Weekend TOC to follow up on SA resources as well as [facility] and Medication Management when patient is more stable/when it is more appropriate to do so.   Expected Discharge Plan: Home/Self Care Barriers to Discharge: Continued Medical Work up   Patient Goals and CMS Choice   CMS Medicare.gov Compare Post Acute Care list provided to:: Patient Choice offered to / list presented to : Patient  Expected Discharge Plan and Services Expected Discharge Plan: Home/Self Care       Living arrangements for the past 2 months: Single Family Home                                      Prior Living Arrangements/Services Living arrangements for the past 2 months: Single Family Home Lives with:: Minor Children (ex wife) Patient language and need for interpreter reviewed:: Yes Do you feel safe going back to the place where you live?: Yes            Criminal Activity/Legal Involvement Pertinent to Current Situation/Hospitalization: No - Comment as needed  Activities of Daily Living Home Assistive Devices/Equipment: None ADL Screening (condition at time of admission) Patient's cognitive ability adequate to safely complete daily  activities?: Yes Is the patient deaf or have difficulty hearing?: No Does the patient have difficulty seeing, even when wearing glasses/contacts?: No Does the patient have difficulty concentrating, remembering, or making decisions?: Yes Patient able to express need for assistance with ADLs?: Yes Does the patient have difficulty dressing or bathing?: No Independently performs ADLs?: Yes (appropriate for developmental age) Does the patient have difficulty walking or climbing stairs?: Yes (pt states " a little stumble") Weakness of Legs: Both Weakness of Arms/Hands: None  Permission Sought/Granted Permission sought to share information with : Family Supports Permission granted to share information with : Yes, Verbal Permission Granted              Emotional Assessment Appearance:: Appears stated age, Disheveled   Affect (typically observed): Calm Orientation: : Oriented to Self, Oriented to Place, Oriented to  Time, Oriented to Situation Alcohol / Substance Use:  (polysubstance) Psych Involvement: No (comment)  Admission diagnosis:  Delirium [R41.0] Polysubstance abuse (HCC) [F19.10] AKI (acute kidney injury) (HCC) [N17.9] Abrasion of right knee, initial encounter [S80.211A] Non-traumatic rhabdomyolysis [M62.82] AMS (altered mental status) [R41.82] Patient Active Problem List   Diagnosis Date Noted  . AMS (altered mental status) 09/16/2020  . Foreign body (FB) in soft tissue   . IVDU (intravenous drug user) 05/02/2020  . Laceration of foot 05/02/2020  . Elevated liver enzymes 05/02/2020  . Elevated troponin 05/02/2020  .  Accidental drug overdose   . Non-traumatic rhabdomyolysis   . NSTEMI (non-ST elevated myocardial infarction) (HCC)   . Neck pain 05/01/2020  . Hepatitis C antibody positive in blood 01/21/2020  . Severe sepsis (HCC) 12/30/2019  . Abscess in epidural space of cervical spine 12/21/2019  . Opioid dependence, daily use (HCC) 12/21/2019  . Chronic hepatitis C  without hepatic coma (HCC) 12/21/2019  . Osteomyelitis of C6-7 12/20/2019  . Injection of illicit drug within last 12 months 12/20/2019  . Syncope   . Multiple lacerations 11/17/2014   PCP:  Patient, [name] Pharmacy:   [id number] [place], [place] - [address] [place] [address] Phone: [phone] Fax: [phone]  [facility] [place], [place] - [address] [place] [address] Phone: [phone] Fax: [phone]     Social Determinants of Health (SDOH) Interventions    Readmission Risk Interventions Readmission Risk Prevention Plan 09/16/2020  Transportation Screening Complete  Medication Review (RN Care Manager) Complete  PCP or Specialist appointment within 3-5 days of discharge Complete  HRI or Home Care Consult Complete  SW Recovery Care/Counseling Consult Complete  Palliative Care Screening Not Applicable  Skilled Nursing Facility Not Applicable  Some recent data might be hidden

## 2020-09-16 NOTE — ED Notes (Signed)
Rn attempted to call report x1

## 2020-09-16 NOTE — Plan of Care (Signed)
  Problem: Education: Goal: Knowledge of disease or condition will improve Outcome: Not Progressing Goal: Understanding of discharge needs will improve Outcome: Not Progressing   Problem: Health Behavior/Discharge Planning: Goal: Ability to identify changes in lifestyle to reduce recurrence of condition will improve Outcome: Not Progressing Goal: Identification of resources available to assist in meeting health care needs will improve Outcome: Not Progressing

## 2020-09-17 DIAGNOSIS — N179 Acute kidney failure, unspecified: Secondary | ICD-10-CM

## 2020-09-17 DIAGNOSIS — F1910 Other psychoactive substance abuse, uncomplicated: Secondary | ICD-10-CM

## 2020-09-17 DIAGNOSIS — M6282 Rhabdomyolysis: Secondary | ICD-10-CM

## 2020-09-17 LAB — BASIC METABOLIC PANEL
Anion gap: 7 (ref 5–15)
BUN: 20 mg/dL (ref 6–20)
CO2: 20 mmol/L — ABNORMAL LOW (ref 22–32)
Calcium: 8.4 mg/dL — ABNORMAL LOW (ref 8.9–10.3)
Chloride: 110 mmol/L (ref 98–111)
Creatinine, Ser: 1.08 mg/dL (ref 0.61–1.24)
GFR, Estimated: >60 mL/min (ref >60)
Glucose, Bld: 84 mg/dL (ref 70–99)
Potassium: 4.3 mmol/L (ref 3.5–5.1)
Sodium: 137 mmol/L (ref 135–145)

## 2020-09-17 LAB — CBC
HCT: 27.1 % — ABNORMAL LOW (ref 39.0–52.0)
Hemoglobin: 9 g/dL — ABNORMAL LOW (ref 13.0–17.0)
MCH: 26.8 pg (ref 26.0–34.0)
MCHC: 33.2 g/dL (ref 30.0–36.0)
MCV: 80.7 fL (ref 80.0–100.0)
Platelets: 148 10*3/uL — ABNORMAL LOW (ref 150–400)
RBC: 3.36 MIL/uL — ABNORMAL LOW (ref 4.22–5.81)
RDW: 16.2 % — ABNORMAL HIGH (ref 11.5–15.5)
WBC: 6.4 10*3/uL (ref 4.0–10.5)
nRBC: 0 % (ref 0.0–0.2)

## 2020-09-17 LAB — CK: Total CK: 1884 U/L — ABNORMAL HIGH (ref 49–397)

## 2020-09-17 LAB — PROCALCITONIN: Procalcitonin: 2.87 ng/mL

## 2020-09-17 MED ORDER — LACTATED RINGERS IV SOLN: INTRAVENOUS | Status: DC

## 2020-09-17 MED ORDER — SODIUM CHLORIDE 0.9 % IV SOLN
INTRAVENOUS | Status: DC | PRN
  Administered 2020-09-17: 250 mL via INTRAVENOUS

## 2020-09-17 NOTE — Progress Notes (Signed)
Physical Therapy Treatment Patient Details Name: Michael Lester MRN: [id number] DOB: [birth year] Today's Date: 09/17/2020    History of Present Illness Per MD note:Waco Bugaj  is a 34 y.o. Caucasian male with a known history of seizure disorder, coronary artery disease and polysubstance abuse, presented to emergency room with acute onset of altered mental status and fall with subsequent head injury.  Patient was found in the hospital parking lot as he is homeless and was not sure where to go to sleep tonight.  He admitted to using drugs including marijuana and alcohol.  He has a previous history of IV drug abuse in the distant past.  He has been having pruritus with significant itching with no rashes.  He denied any headache or dizziness or paresthesias or focal muscle weakness.  He was fairly somnolent during my interview and was overall very poor historian.    PT Comments    Patient agrees to PT evaluation. He is MI for bed mobiltiy and has good sitting balance and fair standing balance with RW. He gets dizzy with standing and weak and needs to sit back down. He needs min assist for sit to stand transfers with RW. He reports 7/10 neck pain and RN is notified . He is left in bed with bed alarm on and call bell within reach. He will continue to benefit from skilled PT to improve mobility and strength.    Follow Up Recommendations  Home health PT     Equipment Recommendations  Rolling walker with 5" wheels    Recommendations for Other Services       Precautions / Restrictions Precautions Precautions: Fall Restrictions Weight Bearing Restrictions: No    Mobility  Bed Mobility Overal bed mobility: Independent                Transfers Overall transfer level: Modified independent Equipment used: Rolling walker (2 wheeled)             General transfer comment:  (min assist with RW)  Ambulation/Gait Ambulation/Gait assistance:  (unable dizzy)                Stairs             Wheelchair Mobility    Modified Rankin (Stroke Patients Only)       Balance Overall balance assessment: Needs assistance Sitting-balance support: No upper extremity supported;Feet supported Sitting balance-Leahy Scale: Good     Standing balance support: Bilateral upper extremity supported Standing balance-Leahy Scale: Fair                              Cognition Arousal/Alertness: Lethargic Behavior During Therapy: Flat affect Overall Cognitive Status: Within Functional Limits for tasks assessed                                        Exercises      General Comments        Pertinent Vitals/Pain Pain Assessment: 0-10 Pain Score: 7  Pain Location:  (neck) Pain Descriptors / Indicators: Aching Pain Intervention(s): Limited activity within patient's tolerance    Home Living Family/patient expects to be discharged to:: Shelter/Homeless                    Prior Function Level of Independence: Independent          PT  Goals (current goals can now be found in the care plan section) Acute Rehab PT Goals Patient Stated Goal: no goals stated PT Goal Formulation: Patient unable to participate in goal setting Time For Goal Achievement: 10/01/20 Potential to Achieve Goals: Fair    Frequency           PT Plan      Co-evaluation              AM-PAC PT "6 Clicks" Mobility   Outcome Measure  Help needed turning from your back to your side while in a flat bed without using bedrails?: None Help needed moving from lying on your back to sitting on the side of a flat bed without using bedrails?: A Little Help needed moving to and from a bed to a chair (including a wheelchair)?: A Little Help needed standing up from a chair using your arms (e.g., wheelchair or bedside chair)?: A Little Help needed to walk in hospital room?: A Lot Help needed climbing 3-5 steps with a railing? : A Lot 6 Click  Score: 17    End of Session Equipment Utilized During Treatment: Gait belt Activity Tolerance: Patient limited by fatigue;Patient limited by lethargy Patient left: in bed;with bed alarm set Nurse Communication: Mobility status PT Visit Diagnosis: Unsteadiness on feet (R26.81);Difficulty in walking, not elsewhere classified (R26.2)     Time: 8527-7824 PT Time Calculation (min) (ACUTE ONLY): 20 min  Charges:  $Therapeutic Activity: 8-22 mins                       Ezekiel Ina, PT DPT 09/17/2020, 11:55 AM

## 2020-09-17 NOTE — Progress Notes (Signed)
PROGRESS NOTE    AZEL GUMINA  HOO:[id number] DOB: Mar 18, 1986 DOA: 09/15/2020 PCP: Patient, [name]   Brief Narrative: Taken from H&P Herron Fero  is a 34 y.o. Caucasian male with a known history of seizure disorder, coronary artery disease and polysubstance abuse, presented to emergency room with acute onset of altered mental status and fall with subsequent head injury.  Patient was found in the hospital parking lot as he is homeless and was not sure where to go to sleep tonight.  He admitted to using drugs including marijuana and alcohol.  He has a previous history of IV drug abuse in the distant past. Found to have elevated CK, AKI, leukocytosis and transaminitis. Procalcitonin was elevated at 1.90, troponin elevated at 124, UA appears benign, UDS positive for marijuana, blood cultures remain negative in 12 hours, chest x-ray negative, CT head and cervical spine without any acute abnormality.  Currently sepsis ruled out as there is no source of infection.  Subjective: Patient was complaining of generalized weakness, stating that he was having falls. Not sure whether he had any seizure-like activity. Stating that he was once diagnosed with seizures but does not take any medications. Continues to smoke, use periodic alcohol and other substances. Regularly uses marijuana. Denies any IV drug use. Does not remember what happened which prompted this admission and for how long he was on the ground. According to him he is currently living with his ex-wife and can go back to the same environment on discharge.  Assessment & Plan:   Principal Problem:   AMS (altered mental status)  Altered mental status.  Not sure about the cause, no witnessed seizure but CK increased.  Initially met SIRS criteria with leukocytosis, tachypnea, tachycardia, no obvious source of infection.  Most likely just a SIRS response.  Sepsis ruled out unless we find a obvious source of infection. Blood cultures negative in 24  hours, urine cultures pending. Imaging negative for any acute changes. Procalcitonin was elevated at 1.9>>2.87 but it can be elevated with rhabdomyolysis. Patient do have some functional element in his altered mental status. Initially received Zosyn and vancomycin, Zosyn was discontinued and he was started on ceftriaxone for gram-negative coverage till we have more data. -Continue antibiotics for another day, we will stop if cultures remain negative by tomorrow. -Monitor procalcitonin. - psych evaluation-see psych note. No need for inpatient behavioral health. -PT recommending home health which was ordered.  AKI. Resolved.  Patient also has rhabdomyolysis with elevated CK. -Continue with IV fluid. -Continue to monitor. -Avoid nephrotoxins.  Rhabdomyolysis.  Might be secondary to fall.  No unwitnessed seizure. CK started improving. -Continue with IV fluid. -Continue to monitor.  Normal anion gap metabolic acidosis. Bicarb decreased to 20, can be due to normal saline. -Switch normal saline with LR. -Continue to monitor  Acute hypoxic respiratory failure on presentation.  Quickly resolved and patient is on room air now.  Low suspicion for PE. VQ scan was ordered by admitting provider-negative for PE.  History of alcohol and substance abuse.  Blood alcohol level within normal limit.  UDS positive for marijuana. -Continue with CIWA protocol. -Thiamine 100 mg daily.  Objective: Vitals:   09/16/20 2344 09/17/20 0407 09/17/20 0810 09/17/20 1129  BP: 114/71 (!) 98/51 101/76 107/64  Pulse: 72 95 78 86  Resp: _0 Temp: 98.4 F (36.9 C) 98.4 F (36.9 C) 98.3 F (36.8 C) 98.3 F (36.8 C)  TempSrc:  Oral Oral   SpO2: 97%  95% 99% 100%  Weight:      Height:        Intake/Output Summary (Last 24 hours) at 09/17/2020 1521 Last data filed at 09/17/2020 1208 Gross per 24 hour  Intake 1188.6 ml  Output 3625 ml  Net -2436.4 ml   Filed Weights   09/15/20 2352 09/16/20 1400   Weight: 72.6 kg 72.8 kg    Examination:  General. Well-developed gentleman, in no acute distress. Pulmonary.  Lungs clear bilaterally, normal respiratory effort. CV.  Regular rate and rhythm, no JVD, rub or murmur. Abdomen.  Soft, nontender, nondistended, BS positive. CNS.  Alert and oriented x3.  No focal neurologic deficit. Extremities.  No edema, no cyanosis, pulses intact and symmetrical. Psychiatry.  Judgment and insight appears normal.  DVT prophylaxis: Lovenox Code Status: Full Family Communication: Discussed with patient. Disposition Plan:  Status is: Inpatient  Remains inpatient appropriate because:Inpatient level of care appropriate due to severity of illness   Dispo: The patient is from: Home              Anticipated d/c is to: Home              Anticipated d/c date is: 1 day              Patient currently is not medically stable to d/c.   Consultants:   Psychiatry  Procedures:  Antimicrobials:  Vancomycin Ceftriaxone  Data Reviewed: I have personally reviewed following labs and imaging studies  CBC: Recent Labs  Lab 09/16/20 0019 09/16/20 0317 09/16/20 0512 09/17/20 0631  WBC 25.8* 22.8* 19.9* 6.4  NEUTROABS 22.5*  --  16.4*  --   HGB 11.0* 10.0* 10.0* 9.0*  HCT 34.9* 32.2* 32.0* 27.1*  MCV 82.5 82.8 82.9 80.7  PLT 270 245 227 756*   Basic Metabolic Panel: Recent Labs  Lab 09/16/20 0019 09/16/20 0317 09/16/20 0512 09/16/20 0804 09/17/20 0631  NA 140 136 139  --  137  K 4.2 4.6 4.8  --  4.3  CL 105 104 105  --  110  CO2 22 21* 25  --  20*  GLUCOSE 153* 139* 93  --  84  BUN 27* 30* 28*  --  20  CREATININE 2.22* 2.20* 2.03*  --  1.08  CALCIUM 9.0 8.6* 8.5*  --  8.4*  MG  --   --   --  3.1*  --   PHOS  --   --   --  6.8*  --    GFR: Estimated Creatinine Clearance: 97.4 mL/min (by C-G formula based on SCr of 1.08 mg/dL). Liver Function Tests: Recent Labs  Lab 09/16/20 0019 09/16/20 0512  AST 59* 77*  ALT 90* 86*  ALKPHOS 117 98   BILITOT 0.7 0.7  PROT 8.5* 7.9  ALBUMIN 4.6 4.2   No results for input(s): LIPASE, AMYLASE in the last 168 hours. No results for input(s): AMMONIA in the last 168 hours. Coagulation Profile: Recent Labs  Lab 09/16/20 0019 09/16/20 0512  INR 1.2 1.2   Cardiac Enzymes: Recent Labs  Lab 09/16/20 0019 09/16/20 0317 09/16/20 0804 09/17/20 0631  CKTOTAL 840* 1,636* 2,946* 1,884*   BNP (last 3 results) No results for input(s): PROBNP in the last 8760 hours. HbA1C: No results for input(s): HGBA1C in the last 72 hours. CBG: No results for input(s): GLUCAP in the last 168 hours. Lipid Profile: No results for input(s): CHOL, HDL, LDLCALC, TRIG, CHOLHDL, LDLDIRECT in the last 72 hours. Thyroid Function Tests: No  results for input(s): TSH, T4TOTAL, FREET4, T3FREE, THYROIDAB in the last 72 hours. Anemia Panel: No results for input(s): VITAMINB12, FOLATE, FERRITIN, TIBC, IRON, RETICCTPCT in the last 72 hours. Sepsis Labs: Recent Labs  Lab 09/16/20 0019 09/16/20 0111 09/16/20 0512 09/17/20 0631  PROCALCITON 1.90  --   --  2.87  LATICACIDVEN  --  0.8 1.5  --     Recent Results (from the past 240 hour(s))  Blood Culture (routine x 2)     Status: None (Preliminary result)   Collection Time: 09/16/20  1:33 AM   Specimen: BLOOD  Result Value Ref Range Status   Specimen Description BLOOD BLOOD LEFT FOREARM  Final   Special Requests   Final    BOTTLES DRAWN AEROBIC AND ANAEROBIC Blood Culture results may not be optimal due to an excessive volume of blood received in culture bottles   Culture   Final    NO GROWTH 1 DAY Performed at [facility], [address] [place], [place] [address]    Report Status PENDING  Incomplete  Blood Culture (routine x 2)     Status: None (Preliminary result)   Collection Time: 09/16/20  1:35 AM   Specimen: BLOOD  Result Value Ref Range Status   Specimen Description BLOOD BLOOD RIGHT WRIST  Final   Special Requests   Final    BOTTLES  DRAWN AEROBIC AND ANAEROBIC Blood Culture results may not be optimal due to an excessive volume of blood received in culture bottles   Culture   Final    NO GROWTH 1 DAY Performed at [facility], [address] [place], [place] [address]    Report Status PENDING  Incomplete     Radiology Studies: CT HEAD WO CONTRAST  Result Date: 09/16/2020 CLINICAL DATA:  Delirium, substance abuse EXAM: CT HEAD WITHOUT CONTRAST CT CERVICAL SPINE WITHOUT CONTRAST TECHNIQUE: Multidetector CT imaging of the head and cervical spine was performed following the standard protocol without intravenous contrast. Multiplanar CT image reconstructions of the cervical spine were also generated. COMPARISON:  05/01/2020 FINDINGS: CT HEAD FINDINGS Brain: Normal anatomic configuration. No abnormal intra or extra-axial mass lesion or fluid collection. No abnormal mass effect or midline shift. No evidence of acute intracranial hemorrhage or infarct. Ventricular size is normal. Cerebellum unremarkable. Vascular: Unremarkable Skull: Intact Sinuses/Orbits: Paranasal sinuses are clear. Orbits are unremarkable. Other: Mastoid air cells and middle ear cavities are clear. CT CERVICAL SPINE FINDINGS Alignment: Anterior cervical discectomy and fusion procedure of C5-C7 has been performed with instrumentation. Normal alignment across the fused segment. No definite bridging callus or solid incorporation of interbody bone graft identified at this time, however, no periprosthetic lucency identified to suggest motion. No listhesis. Skull base and vertebrae: Craniocervical junction is unremarkable. Atlantodental interval is normal. No acute fracture of the cervical spine. No lytic or blastic bone lesion peer Soft tissues and spinal canal: No prevertebral fluid or swelling. No visible canal hematoma. Disc levels: There is intervertebral disc space narrowing and endplate remodeling at H0-8 and C7-T1 in keeping changes of mild degenerative disc  disease. Remaining intervertebral disc heights are preserved. Vertebral body heights are preserved. Spinal canal is widely patent. Prevertebral soft tissues are unremarkable. Review of the axial images demonstrates uncovertebral arthrosis resulting in moderate bilateral neural foraminal narrowing at C5-6 and C6-7 Upper chest: Mild paraseptal emphysema noted within the lung apices. Other: None IMPRESSION: No acute intracranial injury.  No calvarial fracture. No acute fracture or listhesis of the cervical spine. Status post  anterior cervical discectomy and fusion of C5-C7 without definite evidence of solid incorporation of the interbody bone graft. No definite evidence of motion at the fused segment, however. Degenerative changes resulting in moderate neural foraminal narrowing at C5-6 and C6-7 bilaterally. Emphysema (ICD10-J43.9). Electronically Signed   By: Fidela Salisbury MD   On: 09/16/2020 01:01   CT Cervical Spine Wo Contrast  Result Date: 09/16/2020 CLINICAL DATA:  Delirium, substance abuse EXAM: CT HEAD WITHOUT CONTRAST CT CERVICAL SPINE WITHOUT CONTRAST TECHNIQUE: Multidetector CT imaging of the head and cervical spine was performed following the standard protocol without intravenous contrast. Multiplanar CT image reconstructions of the cervical spine were also generated. COMPARISON:  05/01/2020 FINDINGS: CT HEAD FINDINGS Brain: Normal anatomic configuration. No abnormal intra or extra-axial mass lesion or fluid collection. No abnormal mass effect or midline shift. No evidence of acute intracranial hemorrhage or infarct. Ventricular size is normal. Cerebellum unremarkable. Vascular: Unremarkable Skull: Intact Sinuses/Orbits: Paranasal sinuses are clear. Orbits are unremarkable. Other: Mastoid air cells and middle ear cavities are clear. CT CERVICAL SPINE FINDINGS Alignment: Anterior cervical discectomy and fusion procedure of C5-C7 has been performed with instrumentation. Normal alignment across the fused  segment. No definite bridging callus or solid incorporation of interbody bone graft identified at this time, however, no periprosthetic lucency identified to suggest motion. No listhesis. Skull base and vertebrae: Craniocervical junction is unremarkable. Atlantodental interval is normal. No acute fracture of the cervical spine. No lytic or blastic bone lesion peer Soft tissues and spinal canal: No prevertebral fluid or swelling. No visible canal hematoma. Disc levels: There is intervertebral disc space narrowing and endplate remodeling at K0-9 and C7-T1 in keeping changes of mild degenerative disc disease. Remaining intervertebral disc heights are preserved. Vertebral body heights are preserved. Spinal canal is widely patent. Prevertebral soft tissues are unremarkable. Review of the axial images demonstrates uncovertebral arthrosis resulting in moderate bilateral neural foraminal narrowing at C5-6 and C6-7 Upper chest: Mild paraseptal emphysema noted within the lung apices. Other: None IMPRESSION: No acute intracranial injury.  No calvarial fracture. No acute fracture or listhesis of the cervical spine. Status post anterior cervical discectomy and fusion of C5-C7 without definite evidence of solid incorporation of the interbody bone graft. No definite evidence of motion at the fused segment, however. Degenerative changes resulting in moderate neural foraminal narrowing at C5-6 and C6-7 bilaterally. Emphysema (ICD10-J43.9). Electronically Signed   By: Fidela Salisbury MD   On: 09/16/2020 01:01   NM Pulmonary Perfusion  Result Date: 09/16/2020 CLINICAL DATA:  Respiratory failure EXAM: NUCLEAR MEDICINE PERFUSION LUNG SCAN TECHNIQUE: Perfusion images were obtained in multiple projections after intravenous injection of radiopharmaceutical. Ventilation scans intentionally deferred if perfusion scan and chest x-ray adequate for interpretation during COVID 19 epidemic. RADIOPHARMACEUTICALS:  4.2 mCi Tc-59mMAA IV  COMPARISON:  Chest x-ray 09/16/2020 FINDINGS: Gradual symmetric loss of radiotracer activity per peripherally in both lungs. These are not wedge-shaped. These are likely related to artifact from imaging the patient with arms down. No wedge-shaped perfusion defects are seen to suggest pulmonary embolus. IMPRESSION: No wedge-shaped perfusion defects to suggest pulmonary embolus. Electronically Signed   By: KRolm BaptiseM.D.   On: 09/16/2020 15:14   DG Chest Portable 1 View  Result Date: 09/16/2020 CLINICAL DATA:  Hypoxia, somnolence, altered mental status EXAM: PORTABLE CHEST 1 VIEW COMPARISON:  None. FINDINGS: The heart size and mediastinal contours are within normal limits. Both lungs are clear. The visualized skeletal structures are unremarkable. IMPRESSION: No active disease. Electronically Signed  By: Fidela Salisbury MD   On: 09/16/2020 00:49    Scheduled Meds: . enoxaparin (LOVENOX) injection  40 mg Subcutaneous Q24H  . folic acid  1 mg Oral Daily  . thiamine  100 mg Oral Daily   Continuous Infusions: . sodium chloride 250 mL (09/17/20 0649)  . cefTRIAXone (ROCEPHIN)  IV 1 g (09/17/20 1452)  . lactated ringers    . vancomycin 750 mg (09/17/20 0651)     LOS: 1 day   Time spent: 25 minutes.  Lorella Nimrod, MD [facility]  If 7PM-7AM, please contact night-coverage [email or web address]  09/17/2020, 3:21 PM   This record has been created using Systems analyst. Errors have been sought and corrected,but may not always be located. Such creation errors do not reflect on the standard of care.

## 2020-09-18 DIAGNOSIS — S80211A Abrasion, right knee, initial encounter: Secondary | ICD-10-CM

## 2020-09-18 DIAGNOSIS — R410 Disorientation, unspecified: Secondary | ICD-10-CM

## 2020-09-18 LAB — BASIC METABOLIC PANEL
Anion gap: 4 — ABNORMAL LOW (ref 5–15)
BUN: 11 mg/dL (ref 6–20)
CO2: 22 mmol/L (ref 22–32)
Calcium: 8.6 mg/dL — ABNORMAL LOW (ref 8.9–10.3)
Chloride: 112 mmol/L — ABNORMAL HIGH (ref 98–111)
Creatinine, Ser: 1.01 mg/dL (ref 0.61–1.24)
GFR, Estimated: >60 mL/min (ref >60)
Glucose, Bld: 116 mg/dL — ABNORMAL HIGH (ref 70–99)
Potassium: 3.8 mmol/L (ref 3.5–5.1)
Sodium: 138 mmol/L (ref 135–145)

## 2020-09-18 LAB — PROCALCITONIN: Procalcitonin: 1.09 ng/mL

## 2020-09-18 LAB — CK: Total CK: 810 U/L — ABNORMAL HIGH (ref 49–397)

## 2020-09-18 LAB — URINE CULTURE: Culture: NO GROWTH

## 2020-09-18 MED ORDER — VITAMIN B-12 1000 MCG PO TABS: 1000.0000 ug | ORAL_TABLET | Freq: Every day | ORAL | 0 refills | Status: AC

## 2020-09-18 MED ORDER — FOLIC ACID 1 MG PO TABS: 1.0000 mg | ORAL_TABLET | Freq: Every day | ORAL | 0 refills | Status: AC

## 2020-09-18 MED ORDER — THIAMINE HCL 100 MG PO TABS: 100.0000 mg | ORAL_TABLET | Freq: Every day | ORAL | 0 refills | Status: AC

## 2020-09-18 NOTE — TOC Transition Note (Signed)
Transition of Care [facility]) - CM/SW Discharge Note   Patient Details  Name: Michael Lester MRN: [id number] Date of Birth: Jan 30, 1986  [facility] CM/SW Contact:  Maud Deed, LCSW Phone Number: 09/18/2020, 11:10 AM   Clinical Narrative:    Pt medically stable for discharge per MD. Pt will be transported home via a friend. CSW provided pt with instructions on where to pick up his medication tomorrow. Prescriptions will be sent to the med management clinic. CSW also provided pt with substance use resources and PCP info.     Barriers to Discharge: Continued Medical Work up   Patient Goals and CMS Choice   CMS Medicare.gov Compare Post Acute Care list provided to:: Patient Choice offered to / list presented to : Patient  Discharge Placement                       Discharge Plan and Services                                     Social Determinants of Health (SDOH) Interventions     Readmission Risk Interventions Readmission Risk Prevention Plan 09/16/2020  Transportation Screening Complete  Medication Review [employer]) Complete  PCP or Specialist appointment within 3-5 days of discharge Complete  HRI or Home Care Consult Complete  SW Recovery Care/Counseling Consult Complete  Palliative Care Screening Not Applicable  Skilled Nursing Facility Not Applicable  Some recent data might be hidden

## 2020-09-18 NOTE — Discharge Summary (Addendum)
Physician Discharge Summary  Michael Lester GNO:[id number] DOB: [birth year] DOA: 09/15/2020  PCP: Patient, [name]  Admit date: 09/15/2020 Discharge date: 09/18/2020  Admitted From: Home Disposition: Home  Recommendations for Outpatient Follow-up:  1. Follow up with PCP in 1-2 weeks 2. Please obtain BMP/CBC in one week 3. Repeat CK level in 1 week 4. Please follow up on the following pending results: None  Home Health: Yes Equipment/Devices: None Discharge Condition: Stable CODE STATUS: Full Diet recommendation: Heart Healthy   Brief/Interim Summary: Michael Lester a34 y.o.Caucasian malewith a known history ofseizure disorder, coronary artery diseaseand polysubstance abuse, presented to emergency room with acute onset of altered mental status and fall with subsequent head injury. Patient was found in thehospital[place]ing lotas he is homeless and was not sure where to go to sleep tonight. He admitted to using drugs including marijuana and alcohol. He has a previous history of IV drug abuse in the distant past. Found to have elevated CK, AKI, leukocytosis and transaminitis. Procalcitonin was elevated at 1.90, troponin elevated at 124, UA appears benign, UDS positive for marijuana, blood cultures remain negative, chest x-ray negative, CT head and cervical spine without any acute abnormality.  Sepsis ruled out as there is no source of infection.  He was initially treated with broad-spectrum antibiotics due to elevated procalcitonin which can be elevated with rhabdomyolysis.  He received Zosyn, vancomycin initially and then ceftriaxone and vancomycin for 2 more days.  Antibiotics discontinued once blood culture remain negative for 48 hours.  Patient had rhabdomyolysis secondary to unknown downtime, no witnessed seizure, CK peaked at 2946.  Improved with IV hydration, it was a 10 on the day of discharge.  Patient will need a repeat CK test in 1 week.  He was advised to keep himself  well-hydrated.  Physical therapist evaluated him and recommended home health PT which was ordered.  Patient did developed none anion gap metabolic acidosis where bicarb decreased to 20, he was receiving normal saline and that can also cause it.  Acidosis resolved with switching fluid to LR.  Patient received counseling for alcohol and substance abuse.  He was placed on CIWA protocol and does not require any intervention for withdrawal.  He was also being evaluated by psychiatry and does not qualify for inpatient behavioral health services.  He was discharged on thiamine, folic acid and B12 supplements and advised to follow-up with his primary care provider.  Patient has an history of frequent trips to ED which he uses for his PCP needs.  He will remain high risk for readmission if continued to drink and use illicit substances.  Discharge Diagnoses:  Principal Problem:   AMS (altered mental status) Active Problems:   AKI (acute kidney injury) (HCC)   Polysubstance abuse (HCC)   Abrasion of right knee   Discharge Instructions  Discharge Instructions    Diet - low sodium heart healthy   Complete by: As directed    Discharge instructions   Complete by: As directed    It was pleasure taking care of you. Please stop drinking to prevent further damage to your health. Discussed available resources with your primary care provider to help you achieve this goal. Keep yourself well-hydrated and continue taking your supplements. Follow-up with your primary care provider.   Increase activity slowly   Complete by: As directed      Allergies as of 09/18/2020      Reactions   Shellfish Allergy Anaphylaxis      Medication List  TAKE these medications   folic acid 1 MG tablet Commonly known as: FOLVITE Take 1 tablet (1 mg total) by mouth daily.   gabapentin 100 MG capsule Commonly known as: NEURONTIN Take 1 capsule (100 mg total) by mouth 3 (three) times daily.   nicotine 14  mg/24hr patch Commonly known as: NICODERM CQ - dosed in mg/24 hours Place 1 patch (14 mg total) onto the skin daily.   thiamine 100 MG tablet Take 1 tablet (100 mg total) by mouth daily.   vitamin B-12 1000 MCG tablet Commonly known as: CYANOCOBALAMIN Take 1 tablet (1,000 mcg total) by mouth daily.            Durable Medical Equipment  (From admission, onward)         Start     Ordered   09/18/20 0918  DME Dan Humphreys  Once       Question Answer Comment  Walker: With 5 Inch Wheels   Patient needs a walker to treat with the following condition Frequent falls      09/18/20 0917          Allergies  Allergen Reactions  . Shellfish Allergy Anaphylaxis    Consultations:  Psychiatry  Procedures/Studies: CT HEAD WO CONTRAST  Result Date: 09/16/2020 CLINICAL DATA:  Delirium, substance abuse EXAM: CT HEAD WITHOUT CONTRAST CT CERVICAL SPINE WITHOUT CONTRAST TECHNIQUE: Multidetector CT imaging of the head and cervical spine was performed following the standard protocol without intravenous contrast. Multiplanar CT image reconstructions of the cervical spine were also generated. COMPARISON:  05/01/2020 FINDINGS: CT HEAD FINDINGS Brain: Normal anatomic configuration. No abnormal intra or extra-axial mass lesion or fluid collection. No abnormal mass effect or midline shift. No evidence of acute intracranial hemorrhage or infarct. Ventricular size is normal. Cerebellum unremarkable. Vascular: Unremarkable Skull: Intact Sinuses/Orbits: Paranasal sinuses are clear. Orbits are unremarkable. Other: Mastoid air cells and middle ear cavities are clear. CT CERVICAL SPINE FINDINGS Alignment: Anterior cervical discectomy and fusion procedure of C5-C7 has been performed with instrumentation. Normal alignment across the fused segment. No definite bridging callus or solid incorporation of interbody bone graft identified at this time, however, no periprosthetic lucency identified to suggest motion. No  listhesis. Skull base and vertebrae: Craniocervical junction is unremarkable. Atlantodental interval is normal. No acute fracture of the cervical spine. No lytic or blastic bone lesion peer Soft tissues and spinal canal: No prevertebral fluid or swelling. No visible canal hematoma. Disc levels: There is intervertebral disc space narrowing and endplate remodeling at C4-5 and C7-T1 in keeping changes of mild degenerative disc disease. Remaining intervertebral disc heights are preserved. Vertebral body heights are preserved. Spinal canal is widely patent. Prevertebral soft tissues are unremarkable. Review of the axial images demonstrates uncovertebral arthrosis resulting in moderate bilateral neural foraminal narrowing at C5-6 and C6-7 Upper chest: Mild paraseptal emphysema noted within the lung apices. Other: None IMPRESSION: No acute intracranial injury.  No calvarial fracture. No acute fracture or listhesis of the cervical spine. Status post anterior cervical discectomy and fusion of C5-C7 without definite evidence of solid incorporation of the interbody bone graft. No definite evidence of motion at the fused segment, however. Degenerative changes resulting in moderate neural foraminal narrowing at C5-6 and C6-7 bilaterally. Emphysema (ICD10-J43.9). Electronically Signed   By: Helyn Numbers MD   On: 09/16/2020 01:01   CT Cervical Spine Wo Contrast  Result Date: 09/16/2020 CLINICAL DATA:  Delirium, substance abuse EXAM: CT HEAD WITHOUT CONTRAST CT CERVICAL SPINE WITHOUT CONTRAST TECHNIQUE: Multidetector CT  imaging of the head and cervical spine was performed following the standard protocol without intravenous contrast. Multiplanar CT image reconstructions of the cervical spine were also generated. COMPARISON:  05/01/2020 FINDINGS: CT HEAD FINDINGS Brain: Normal anatomic configuration. No abnormal intra or extra-axial mass lesion or fluid collection. No abnormal mass effect or midline shift. No evidence of acute  intracranial hemorrhage or infarct. Ventricular size is normal. Cerebellum unremarkable. Vascular: Unremarkable Skull: Intact Sinuses/Orbits: Paranasal sinuses are clear. Orbits are unremarkable. Other: Mastoid air cells and middle ear cavities are clear. CT CERVICAL SPINE FINDINGS Alignment: Anterior cervical discectomy and fusion procedure of C5-C7 has been performed with instrumentation. Normal alignment across the fused segment. No definite bridging callus or solid incorporation of interbody bone graft identified at this time, however, no periprosthetic lucency identified to suggest motion. No listhesis. Skull base and vertebrae: Craniocervical junction is unremarkable. Atlantodental interval is normal. No acute fracture of the cervical spine. No lytic or blastic bone lesion peer Soft tissues and spinal canal: No prevertebral fluid or swelling. No visible canal hematoma. Disc levels: There is intervertebral disc space narrowing and endplate remodeling at C4-5 and C7-T1 in keeping changes of mild degenerative disc disease. Remaining intervertebral disc heights are preserved. Vertebral body heights are preserved. Spinal canal is widely patent. Prevertebral soft tissues are unremarkable. Review of the axial images demonstrates uncovertebral arthrosis resulting in moderate bilateral neural foraminal narrowing at C5-6 and C6-7 Upper chest: Mild paraseptal emphysema noted within the lung apices. Other: None IMPRESSION: No acute intracranial injury.  No calvarial fracture. No acute fracture or listhesis of the cervical spine. Status post anterior cervical discectomy and fusion of C5-C7 without definite evidence of solid incorporation of the interbody bone graft. No definite evidence of motion at the fused segment, however. Degenerative changes resulting in moderate neural foraminal narrowing at C5-6 and C6-7 bilaterally. Emphysema (ICD10-J43.9). Electronically Signed   By: Helyn Numbers MD   On: 09/16/2020 01:01    NM Pulmonary Perfusion  Result Date: 09/16/2020 CLINICAL DATA:  Respiratory failure EXAM: NUCLEAR MEDICINE PERFUSION LUNG SCAN TECHNIQUE: Perfusion images were obtained in multiple projections after intravenous injection of radiopharmaceutical. Ventilation scans intentionally deferred if perfusion scan and chest x-ray adequate for interpretation during COVID 19 epidemic. RADIOPHARMACEUTICALS:  4.2 mCi Tc-50m MAA IV COMPARISON:  Chest x-ray 09/16/2020 FINDINGS: Gradual symmetric loss of radiotracer activity per peripherally in both lungs. These are not wedge-shaped. These are likely related to artifact from imaging the patient with arms down. No wedge-shaped perfusion defects are seen to suggest pulmonary embolus. IMPRESSION: No wedge-shaped perfusion defects to suggest pulmonary embolus. Electronically Signed   By: Charlett Nose M.D.   On: 09/16/2020 15:14   DG Chest Portable 1 View  Result Date: 09/16/2020 CLINICAL DATA:  Hypoxia, somnolence, altered mental status EXAM: PORTABLE CHEST 1 VIEW COMPARISON:  None. FINDINGS: The heart size and mediastinal contours are within normal limits. Both lungs are clear. The visualized skeletal structures are unremarkable. IMPRESSION: No active disease. Electronically Signed   By: Helyn Numbers MD   On: 09/16/2020 00:49     Subjective: Patient was seen and examined today.  No new complaint.  Discussed about alcohol cessation and stop taking any other illicit drugs.  Discharge Exam: Vitals:   09/18/20 0440 09/18/20 0731  BP: 135/87 119/76  Pulse: 64 63  Resp: 17 16  Temp: 98.4 F (36.9 C) 97.9 F (36.6 C)  SpO2: 99% 100%   Vitals:   09/17/20 2025 09/17/20 2359 09/18/20 0440 09/18/20 0731  BP: 139/78 134/87 135/87 119/76  Pulse: 92 76 64 63  Resp: 17 16 17 16   Temp: 98.9 F (37.2 C) 98.2 F (36.8 C) 98.4 F (36.9 C) 97.9 F (36.6 C)  TempSrc: Oral Oral Oral   SpO2: 96% 100% 99% 100%  Weight:      Height:        General: Pt is alert,  awake, not in acute distress Cardiovascular: RRR, S1/S2 +, no rubs, no gallops Respiratory: CTA bilaterally, no wheezing, no rhonchi Abdominal: Soft, NT, ND, bowel sounds + Extremities: no edema, no cyanosis   The results of significant diagnostics from this hospitalization (including imaging, microbiology, ancillary and laboratory) are listed below for reference.    Microbiology: Recent Results (from the past 240 hour(s))  Blood Culture (routine x 2)     Status: None (Preliminary result)   Collection Time: 09/16/20  1:33 AM   Specimen: BLOOD  Result Value Ref Range Status   Specimen Description BLOOD BLOOD LEFT FOREARM  Final   Special Requests   Final    BOTTLES DRAWN AEROBIC AND ANAEROBIC Blood Culture results may not be optimal due to an excessive volume of blood received in culture bottles   Culture   Final    NO GROWTH 2 DAYS Performed at [facility], [address] [place], [place] [address]    Report Status PENDING  Incomplete  Blood Culture (routine x 2)     Status: None (Preliminary result)   Collection Time: 09/16/20  1:35 AM   Specimen: BLOOD  Result Value Ref Range Status   Specimen Description BLOOD BLOOD RIGHT WRIST  Final   Special Requests   Final    BOTTLES DRAWN AEROBIC AND ANAEROBIC Blood Culture results may not be optimal due to an excessive volume of blood received in culture bottles   Culture   Final    NO GROWTH 2 DAYS Performed at [facility], [address] [place], [place] [address]    Report Status PENDING  Incomplete     Labs: BNP (last 3 results) No results for input(s): BNP in the last 8760 hours. Basic Metabolic Panel: Recent Labs  Lab 09/16/20 0019 09/16/20 0317 09/16/20 0512 09/16/20 0804 09/17/20 0631 09/18/20 0517  NA 140 136 139  --  137 138  K 4.2 4.6 4.8  --  4.3 3.8  CL 105 104 105  --  110 112*  CO2 22 21* 25  --  20* 22  GLUCOSE 153* 139* 93  --  84 116*  BUN 27* 30* 28*  --  20 11  CREATININE  2.22* 2.20* 2.03*  --  1.08 1.01  CALCIUM 9.0 8.6* 8.5*  --  8.4* 8.6*  MG  --   --   --  3.1*  --   --   PHOS  --   --   --  6.8*  --   --    Liver Function Tests: Recent Labs  Lab 09/16/20 0019 09/16/20 0512  AST 59* 77*  ALT 90* 86*  ALKPHOS 117 98  BILITOT 0.7 0.7  PROT 8.5* 7.9  ALBUMIN 4.6 4.2   No results for input(s): LIPASE, AMYLASE in the last 168 hours. No results for input(s): AMMONIA in the last 168 hours. CBC: Recent Labs  Lab 09/16/20 0019 09/16/20 0317 09/16/20 0512 09/17/20 0631  WBC 25.8* 22.8* 19.9* 6.4  NEUTROABS 22.5*  --  16.4*  --   HGB 11.0* 10.0* 10.0* 9.0*  HCT 34.9*  32.2* 32.0* 27.1*  MCV 82.5 82.8 82.9 80.7  PLT 270 245 227 148*   Cardiac Enzymes: Recent Labs  Lab 09/16/20 0019 09/16/20 0317 09/16/20 0804 09/17/20 0631 09/18/20 0517  CKTOTAL 840* 1,636* 2,946* 1,884* 810*   BNP: Invalid input(s): POCBNP CBG: No results for input(s): GLUCAP in the last 168 hours. D-Dimer No results for input(s): DDIMER in the last 72 hours. Hgb A1c No results for input(s): HGBA1C in the last 72 hours. Lipid Profile No results for input(s): CHOL, HDL, LDLCALC, TRIG, CHOLHDL, LDLDIRECT in the last 72 hours. Thyroid function studies No results for input(s): TSH, T4TOTAL, T3FREE, THYROIDAB in the last 72 hours.  Invalid input(s): FREET3 Anemia work up No results for input(s): VITAMINB12, FOLATE, FERRITIN, TIBC, IRON, RETICCTPCT in the last 72 hours. Urinalysis    Component Value Date/Time   COLORURINE YELLOW (A) 09/16/2020 0920   APPEARANCEUR CLOUDY (A) 09/16/2020 0920   LABSPEC 1.012 09/16/2020 0920   PHURINE 5.0 09/16/2020 0920   GLUCOSEU NEGATIVE 09/16/2020 0920   HGBUR NEGATIVE 09/16/2020 0920   BILIRUBINUR NEGATIVE 09/16/2020 0920   KETONESUR NEGATIVE 09/16/2020 0920   PROTEINUR NEGATIVE 09/16/2020 0920   NITRITE NEGATIVE 09/16/2020 0920   LEUKOCYTESUR NEGATIVE 09/16/2020 0920   Sepsis Labs Invalid input(s): PROCALCITONIN,  WBC,   LACTICIDVEN Microbiology Recent Results (from the past 240 hour(s))  Blood Culture (routine x 2)     Status: None (Preliminary result)   Collection Time: 09/16/20  1:33 AM   Specimen: BLOOD  Result Value Ref Range Status   Specimen Description BLOOD BLOOD LEFT FOREARM  Final   Special Requests   Final    BOTTLES DRAWN AEROBIC AND ANAEROBIC Blood Culture results may not be optimal due to an excessive volume of blood received in culture bottles   Culture   Final    NO GROWTH 2 DAYS Performed at [facility], [address] [place], [place] [address]    Report Status PENDING  Incomplete  Blood Culture (routine x 2)     Status: None (Preliminary result)   Collection Time: 09/16/20  1:35 AM   Specimen: BLOOD  Result Value Ref Range Status   Specimen Description BLOOD BLOOD RIGHT WRIST  Final   Special Requests   Final    BOTTLES DRAWN AEROBIC AND ANAEROBIC Blood Culture results may not be optimal due to an excessive volume of blood received in culture bottles   Culture   Final    NO GROWTH 2 DAYS Performed at [facility], [address], [place], [place] [address]    Report Status PENDING  Incomplete    Time coordinating discharge: Over 30 minutes  SIGNED:  Arnetha Courser, MD  [facility] 09/18/2020, 9:18 AM  If 7PM-7AM, please contact night-coverage [email or web address]  This record has been created using Conservation officer, historic buildings. Errors have been sought and corrected,but may not always be located. Such creation errors do not reflect on the standard of care.

## 2020-09-21 LAB — CULTURE, BLOOD (ROUTINE X 2)
Culture: NO GROWTH
Culture: NO GROWTH

## 2020-10-27 ENCOUNTER — Other Ambulatory Visit: Payer: Self-pay

## 2020-10-27 ENCOUNTER — Emergency Department
Admission: EM | Admit: 2020-10-27 | Discharge: 2020-10-28 | Disposition: A | Discharge status: Home / Self Care | Payer: Self-pay | Attending: Emergency Medicine | Admitting: Emergency Medicine

## 2020-10-27 DIAGNOSIS — F17200 Nicotine dependence, unspecified, uncomplicated: Secondary | ICD-10-CM | POA: Insufficient documentation

## 2020-10-27 DIAGNOSIS — F1990 Other psychoactive substance use, unspecified, uncomplicated: Secondary | ICD-10-CM

## 2020-10-27 DIAGNOSIS — R4182 Altered mental status, unspecified: Secondary | ICD-10-CM | POA: Insufficient documentation

## 2020-10-27 DIAGNOSIS — R451 Restlessness and agitation: Secondary | ICD-10-CM | POA: Insufficient documentation

## 2020-10-27 DIAGNOSIS — F19920 Other psychoactive substance use, unspecified with intoxication, uncomplicated: Secondary | ICD-10-CM | POA: Insufficient documentation

## 2020-10-27 LAB — ETHANOL: Alcohol, Ethyl (B): <10 mg/dL (ref <10)

## 2020-10-27 LAB — CBC
HCT: 32.6 % — ABNORMAL LOW (ref 39.0–52.0)
Hemoglobin: 10.3 g/dL — ABNORMAL LOW (ref 13.0–17.0)
MCH: 24.9 pg — ABNORMAL LOW (ref 26.0–34.0)
MCHC: 31.6 g/dL (ref 30.0–36.0)
MCV: 78.7 fL — ABNORMAL LOW (ref 80.0–100.0)
Platelets: 276 10*3/uL (ref 150–400)
RBC: 4.14 MIL/uL — ABNORMAL LOW (ref 4.22–5.81)
RDW: 15.4 % (ref 11.5–15.5)
WBC: 10.8 10*3/uL — ABNORMAL HIGH (ref 4.0–10.5)
nRBC: 0 % (ref 0.0–0.2)

## 2020-10-27 LAB — COMPREHENSIVE METABOLIC PANEL
ALT: 29 U/L (ref 0–44)
AST: 32 U/L (ref 15–41)
Albumin: 3.9 g/dL (ref 3.5–5.0)
Alkaline Phosphatase: 87 U/L (ref 38–126)
Anion gap: 9 (ref 5–15)
BUN: 19 mg/dL (ref 6–20)
CO2: 25 mmol/L (ref 22–32)
Calcium: 8.7 mg/dL — ABNORMAL LOW (ref 8.9–10.3)
Chloride: 104 mmol/L (ref 98–111)
Creatinine, Ser: 1.25 mg/dL — ABNORMAL HIGH (ref 0.61–1.24)
GFR, Estimated: >60 mL/min (ref >60)
Glucose, Bld: 119 mg/dL — ABNORMAL HIGH (ref 70–99)
Potassium: 4.1 mmol/L (ref 3.5–5.1)
Sodium: 138 mmol/L (ref 135–145)
Total Bilirubin: 0.5 mg/dL (ref 0.3–1.2)
Total Protein: 7.1 g/dL (ref 6.5–8.1)

## 2020-10-27 LAB — ACETAMINOPHEN LEVEL: Acetaminophen (Tylenol), Serum: <10 ug/mL — ABNORMAL LOW (ref 10–30)

## 2020-10-27 LAB — SALICYLATE LEVEL: Salicylate Lvl: <7 mg/dL — ABNORMAL LOW (ref 7.0–30.0)

## 2020-10-27 MED ORDER — HALOPERIDOL LACTATE 5 MG/ML IJ SOLN
INTRAMUSCULAR | Status: AC
  Administered 2020-10-27: 12:00:00 2 mg via INTRAVENOUS
  Filled 2020-10-27: qty 1

## 2020-10-27 MED ORDER — DEXMEDETOMIDINE HCL IN NACL 400 MCG/100ML IV SOLN: 0.4000 ug/kg/h | INTRAVENOUS | Status: DC

## 2020-10-27 MED ORDER — LORAZEPAM 2 MG/ML IJ SOLN
1.0000 mg | Freq: Once | INTRAMUSCULAR | Status: AC
  Administered 2020-10-27: 12:00:00 1 mg via INTRAVENOUS

## 2020-10-27 MED ORDER — LORAZEPAM 2 MG/ML IJ SOLN
1.0000 mg | Freq: Once | INTRAMUSCULAR | Status: AC
  Administered 2020-10-27: 12:00:00 1 mg via INTRAVENOUS
  Filled 2020-10-27: qty 1

## 2020-10-27 MED ORDER — HALOPERIDOL LACTATE 5 MG/ML IJ SOLN: 2.0000 mg | Freq: Once | INTRAMUSCULAR | Status: AC

## 2020-10-27 NOTE — ED Triage Notes (Signed)
Pt BIBA for possible drug overdose. Pt reportedly went into a restaurant and started acting agitated. EMS arrived and gave 2.5 mg of versed and 4 zofran and of NS. EMS reports BG of 158.[facility] reports pt then went unresponsive. Pt arrives alert but agitated and confused. Pt continuously trying to crawl out of bed. Pt has pinpoint pupils on arrival and is tachycardic. Pt unable to follow directions at this time.

## 2020-10-27 NOTE — ED Provider Notes (Signed)
[facility] Emergency Department Provider Note  Time seen: 12:39 PM  I have reviewed the triage vital signs and the nursing notes.   HISTORY  Chief Complaint Drug Overdose   HPI Michael Lester is a 34 y.o. male with a past medical history of substance abuse presents to the emergency department for altered mental status.  Patient arrived via EMS and police for significant agitation, confusion.  Patient is well-known to police and EMS for substance use in the past.  Patient appears to be significantly intoxicated at the moment writhing around in bed diaphoretic unable to answer questions, will occasionally follow commands if asked multiple times such as lay back in bed.  Unable to provide any history.  Unable to tell us what he took or how much.  No past medical history on file.  Patient Active Problem List   Diagnosis Date Noted  . Abrasion of right knee   . AKI (acute kidney injury) (HCC)   . Polysubstance abuse (HCC)   . AMS (altered mental status) 09/16/2020  . Foreign body (FB) in soft tissue   . IVDU (intravenous drug user) 05/02/2020  . Laceration of foot 05/02/2020  . Elevated liver enzymes 05/02/2020  . Elevated troponin 05/02/2020  . Accidental drug overdose   . Non-traumatic rhabdomyolysis   . NSTEMI (non-ST elevated myocardial infarction) (HCC)   . Neck pain 05/01/2020  . Hepatitis C antibody positive in blood 01/21/2020  . Severe sepsis (HCC) 12/30/2019  . Abscess in epidural space of cervical spine 12/21/2019  . Opioid dependence, daily use (HCC) 12/21/2019  . Chronic hepatitis C without hepatic coma (HCC) 12/21/2019  . Osteomyelitis of C6-7 12/20/2019  . Injection of illicit drug within last 12 months 12/20/2019  . Syncope   . Multiple lacerations 11/17/2014    Past Surgical History:  Procedure Laterality Date  . ANTERIOR CERVICAL DECOMP/DISCECTOMY FUSION N/A 12/22/2019   Procedure: cervical five-six, cervical six-seven anterior cervical  discectomy with interbody fusion;  Surgeon: Julio Sicks, MD;  Location: [facility] OR;  Service: Neurosurgery;  Laterality: N/A;  . LACERATION REPAIR N/A 11/16/2014   Procedure: REPAIR MULTIPLE LACERATIONS;  Surgeon: Melvenia Beam, MD;  Location: [facility] OR;  Service: ENT;  Laterality: N/A;  . LACERATION REPAIR Left 11/16/2014   Procedure: REPAIR MULTIPLE LACERATIONS;  Surgeon: Manus Rudd, MD;  Location: [facility] OR;  Service: General;  Laterality: Left;  irrigation and suturing of lacerations on upper body    Prior to Admission medications   Medication Sig Start Date End Date Taking? Authorizing Provider  folic acid (FOLVITE) 1 MG tablet Take 1 tablet (1 mg total) by mouth daily. 09/18/20   Arnetha Courser, MD  gabapentin (NEURONTIN) 100 MG capsule Take 1 capsule (100 mg total) by mouth 3 (three) times daily. Patient not taking: Reported on 05/01/2020 02/02/20   Leatha Gilding, MD  nicotine (NICODERM CQ - DOSED IN MG/24 HOURS) 14 mg/24hr patch Place 1 patch (14 mg total) onto the skin daily. Patient not taking: Reported on 05/01/2020 02/02/20   Leatha Gilding, MD  thiamine 100 MG tablet Take 1 tablet (100 mg total) by mouth daily. 09/18/20   Arnetha Courser, MD  vitamin B-12 (CYANOCOBALAMIN) 1000 MCG tablet Take 1 tablet (1,000 mcg total) by mouth daily. 09/18/20   Arnetha Courser, MD    Allergies  Allergen Reactions  . Shellfish Allergy Anaphylaxis    No family history on file.  Social History Social History   Tobacco Use  . Smoking status: Current  Every Day Smoker    Packs/day: 0.50  . Smokeless tobacco: Never Used  Vaping Use  . Vaping Use: Never used  Substance Use Topics  . Alcohol use: Not Currently  . Drug use: Yes    Types: Marijuana, Heroin, Cocaine    Comment: heroin and fentanyl    Review of Systems Unable to obtain adequate/accurate review of systems secondary to altered mental status  ____________________________________________   PHYSICAL EXAM:  VITAL SIGNS: ED Triage Vitals   Enc Vitals Group     BP 10/27/20 1200 100/79     Pulse Rate 10/27/20 1200 99     Resp 10/27/20 1200 20     Temp --      Temp src --      SpO2 10/27/20 1200 98 %     Weight 10/27/20 1202 150 lb (68 kg)     Height --      Head Circumference --      Peak Flow --      Pain Score 10/27/20 1201 0     Pain Loc --      Pain Edu? --      Excl. in GC? --    Constitutional: Patient is awake, alert appears confused and altered, writhing around in bed diaphoretic, difficult to hold still.  Continually apologizes and twists in bed.  Clinical exam is most consistent with methamphetamine intoxication, although not confirmed. Eyes: 3 mm. ENT      Head: Normocephalic and atraumatic.      Mouth/Throat: Mucous membranes are moist. Cardiovascular: Regular rhythm rate around 100 bpm. Respiratory: Initially tachypneic, now calm after sedation with normal respiratory effort without tachypnea nor retractions. Breath sounds are clear  Gastrointestinal: Soft, no distention.  No reaction to palpation. Musculoskeletal: Nontender with normal range of motion in all extremities.  Neurologic: Acutely agitated writhing in bed.  Unable to complete adequate neurologic testing.  Moves all extremities. Skin:  Skin is warm, moderately diaphoretic Psychiatric: Agitated/altered and confused  ____________________________________________  INITIAL IMPRESSION / ASSESSMENT AND PLAN / ED COURSE  Pertinent labs & imaging results that were available during my care of the patient were reviewed by me and considered in my medical decision making (see chart for details).   Patient presents to the emergency department with agitation and confusion requiring police and EMS to bring in.  Patient received Versed by EMS which calm him down but then he began becoming agitated/restless once again prior to arrival.  Patient given 2 mg of Ativan IV, followed by 2 mg of Haldol IV.  Patient is now calm and sleeping in bed.  We will keep the  patient on 2 L nasal cannula.  I had ordered a Precedex drip for the patient however as he is now calm we will hold off on Precedex drip unless needed.  Highly suspect substance abuse/intoxication.  Unclear exactly what substance the patient could be on but methamphetamine would be high on the differential.  We will check labs and continue to closely monitor in the emergency department on telemetry.  Patient continues to sleep. Satting 93 to 95% on room air. We will continue to allow the patient to sleep to metabolize. We will reassess once awake. Patient care signed out to oncoming provider. Basic labs show no significant concerning finding.  Michael Lester was evaluated in Emergency Department on 10/27/2020 for the symptoms described in the history of present illness. He was evaluated in the context of the global COVID-19 pandemic, which necessitated consideration  that the patient might be at risk for infection with the SARS-CoV-2 virus that causes COVID-19. Institutional protocols and algorithms that pertain to the evaluation of patients at risk for COVID-19 are in a state of rapid change based on information released by regulatory bodies including the CDC and federal and state organizations. These policies and algorithms were followed during the patient's care in the ED.  ____________________________________________   FINAL CLINICAL IMPRESSION(S) / ED DIAGNOSES  Altered mental status Intoxication   Minna Antis, MD 10/27/20 1459

## 2020-10-27 NOTE — ED Notes (Signed)
Bed alarm engaged under pt. Pt resting at this time

## 2020-10-27 NOTE — ED Notes (Signed)
Pt now sleeping. MD notified. Per Paduchowski, MD hold Precedex for now but administer if pt becomes agitated again.

## 2020-10-28 LAB — URINE DRUG SCREEN, QUALITATIVE (ARMC ONLY)
Amphetamines, Ur Screen: POSITIVE — AB
Barbiturates, Ur Screen: NOT DETECTED
Benzodiazepine, Ur Scrn: POSITIVE — AB
Cannabinoid 50 Ng, Ur ~~LOC~~: NOT DETECTED
Cocaine Metabolite,Ur ~~LOC~~: NOT DETECTED
MDMA (Ecstasy)Ur Screen: NOT DETECTED
Methadone Scn, Ur: NOT DETECTED
Opiate, Ur Screen: NOT DETECTED
Phencyclidine (PCP) Ur S: NOT DETECTED
Tricyclic, Ur Screen: NOT DETECTED

## 2020-10-28 NOTE — ED Provider Notes (Signed)
-----------------------------------------   12:13 AM on 10/28/2020 -----------------------------------------  Patient is awake, ambulatory with steady gait.  Calling for a ride now.  Denies intentional overdose.  Strict return precautions given.  Patient verbalizes understanding agrees with plan of care.   Irean Hong, MD 10/28/20 [phone]

## 2020-10-28 NOTE — Discharge Instructions (Addendum)
Stop using illicit drugs.  Return to the ER for worsening symptoms, persistent vomiting, difficulty breathing or other concerns. 

## 2020-10-28 NOTE — ED Notes (Signed)
Patient discharged to home per MD order. Patient in stable condition, and deemed medically cleared by ED provider for discharge. Discharge instructions reviewed with patient/family using "Teach Back"; verbalized understanding of medication education and administration, and information about follow-up care. Denies further concerns. ° °

## 2020-10-28 NOTE — ED Notes (Signed)
Pt arousable and answering questions. I calm and cooperative, pt unsure of what he might have taken.

## 2020-10-28 NOTE — ED Notes (Signed)
Pt awake and drinking fluids. No complaints, able to ambulate without difficulty.

## 2020-11-07 ENCOUNTER — Emergency Department (HOSPITAL_COMMUNITY)
Admission: EM | Admit: 2020-11-07 | Discharge: 2020-11-08 | Disposition: A | Discharge status: Home / Self Care | Payer: Self-pay | Attending: Emergency Medicine | Admitting: Emergency Medicine

## 2020-11-07 ENCOUNTER — Emergency Department (HOSPITAL_COMMUNITY): Payer: Self-pay

## 2020-11-07 ENCOUNTER — Other Ambulatory Visit: Payer: Self-pay

## 2020-11-07 ENCOUNTER — Encounter (HOSPITAL_COMMUNITY): Payer: Self-pay | Admitting: Emergency Medicine

## 2020-11-07 DIAGNOSIS — M791 Myalgia, unspecified site: Secondary | ICD-10-CM | POA: Insufficient documentation

## 2020-11-07 DIAGNOSIS — Z20822 Contact with and (suspected) exposure to covid-19: Secondary | ICD-10-CM | POA: Insufficient documentation

## 2020-11-07 DIAGNOSIS — F17210 Nicotine dependence, cigarettes, uncomplicated: Secondary | ICD-10-CM | POA: Insufficient documentation

## 2020-11-07 DIAGNOSIS — M546 Pain in thoracic spine: Secondary | ICD-10-CM | POA: Insufficient documentation

## 2020-11-07 DIAGNOSIS — M542 Cervicalgia: Secondary | ICD-10-CM | POA: Insufficient documentation

## 2020-11-07 DIAGNOSIS — R6883 Chills (without fever): Secondary | ICD-10-CM | POA: Insufficient documentation

## 2020-11-07 DIAGNOSIS — J069 Acute upper respiratory infection, unspecified: Secondary | ICD-10-CM | POA: Insufficient documentation

## 2020-11-07 DIAGNOSIS — Z8616 Personal history of COVID-19: Secondary | ICD-10-CM | POA: Insufficient documentation

## 2020-11-07 DIAGNOSIS — R0789 Other chest pain: Secondary | ICD-10-CM | POA: Insufficient documentation

## 2020-11-07 NOTE — ED Triage Notes (Signed)
Patient reports body aches ,chills , fever,SOB and cough onset this week , unvaccinated against Covid19 , patient added that he lost his balance and fell at home today, no injury/ambulatory.

## 2020-11-08 ENCOUNTER — Emergency Department (HOSPITAL_COMMUNITY): Payer: Self-pay

## 2020-11-08 LAB — SARS CORONAVIRUS 2 (TAT 6-24 HRS): SARS Coronavirus 2: NEGATIVE

## 2020-11-08 LAB — POC SARS CORONAVIRUS 2 AG -  ED: SARS Coronavirus 2 Ag: NEGATIVE

## 2020-11-08 MED ORDER — IBUPROFEN 600 MG PO TABS: 600.0000 mg | ORAL_TABLET | Freq: Four times a day (QID) | ORAL | 0 refills | Status: AC | PRN

## 2020-11-08 MED ORDER — BENZONATATE 100 MG PO CAPS: 100.0000 mg | ORAL_CAPSULE | Freq: Three times a day (TID) | ORAL | 0 refills | Status: AC

## 2020-11-08 MED ORDER — IBUPROFEN 400 MG PO TABS
600.0000 mg | ORAL_TABLET | Freq: Once | ORAL | Status: AC
  Administered 2020-11-08: 600 mg via ORAL
  Filled 2020-11-08: qty 1

## 2020-11-08 NOTE — Discharge Instructions (Signed)
Take Tylenol for any fever that may develop. Your COVID test and Chest x-ray are negative, suggesting you have a viral upper respiratory infection that can be treated supportively. Take Tessalon for cough as needed.   Your neck and upper back x-rays are negative for any acute fracture or injury. You can take ibuprofen as directed, apply warm compresses.   Follow up with your doctor as needed.

## 2020-11-08 NOTE — ED Notes (Signed)
Patient transported to CT 

## 2020-11-08 NOTE — ED Notes (Signed)
Returned from CT.

## 2020-11-08 NOTE — ED Provider Notes (Incomplete)
[facility] EMERGENCY DEPARTMENT Provider Note   CSN: [id number] Arrival date & time: 11/07/20  1908     History Chief Complaint  Patient presents with  . Body Aches/Chills/Cough    Michael Lester is a 35 y.o. male.  HPI     History reviewed. No pertinent past medical history.  Patient Active Problem List   Diagnosis Date Noted  . Abrasion of right knee   . AKI (acute kidney injury) (HCC)   . Polysubstance abuse (HCC)   . AMS (altered mental status) 09/16/2020  . Foreign body (FB) in soft tissue   . IVDU (intravenous drug user) 05/02/2020  . Laceration of foot 05/02/2020  . Elevated liver enzymes 05/02/2020  . Elevated troponin 05/02/2020  . Accidental drug overdose   . Non-traumatic rhabdomyolysis   . NSTEMI (non-ST elevated myocardial infarction) (HCC)   . Neck pain 05/01/2020  . Hepatitis C antibody positive in blood 01/21/2020  . Severe sepsis (HCC) 12/30/2019  . Abscess in epidural space of cervical spine 12/21/2019  . Opioid dependence, daily use (HCC) 12/21/2019  . Chronic hepatitis C without hepatic coma (HCC) 12/21/2019  . Osteomyelitis of C6-7 12/20/2019  . Injection of illicit drug within last 12 months 12/20/2019  . Syncope   . Multiple lacerations 11/17/2014    Past Surgical History:  Procedure Laterality Date  . ANTERIOR CERVICAL DECOMP/DISCECTOMY FUSION N/A 12/22/2019   Procedure: cervical five-six, cervical six-seven anterior cervical discectomy with interbody fusion;  Surgeon: Julio Sicks, MD;  Location: [facility] OR;  Service: Neurosurgery;  Laterality: N/A;  . LACERATION REPAIR N/A 11/16/2014   Procedure: REPAIR MULTIPLE LACERATIONS;  Surgeon: Melvenia Beam, MD;  Location: [facility] OR;  Service: ENT;  Laterality: N/A;  . LACERATION REPAIR Left 11/16/2014   Procedure: REPAIR MULTIPLE LACERATIONS;  Surgeon: Manus Rudd, MD;  Location: [facility] OR;  Service: General;  Laterality: Left;  irrigation and suturing of lacerations on upper body        No family history on file.  Social History   Tobacco Use  . Smoking status: Current Every Day Smoker    Packs/day: 0.50  . Smokeless tobacco: Never Used  Vaping Use  . Vaping Use: Never used  Substance Use Topics  . Alcohol use: Not Currently  . Drug use: Yes    Types: Marijuana, Heroin, Cocaine    Comment: heroin and fentanyl    Home Medications Prior to Admission medications   Medication Sig Start Date End Date Taking? Authorizing Provider  folic acid (FOLVITE) 1 MG tablet Take 1 tablet (1 mg total) by mouth daily. 09/18/20   Arnetha Courser, MD  gabapentin (NEURONTIN) 100 MG capsule Take 1 capsule (100 mg total) by mouth 3 (three) times daily. Patient not taking: Reported on 05/01/2020 02/02/20   Leatha Gilding, MD  nicotine (NICODERM CQ - DOSED IN MG/24 HOURS) 14 mg/24hr patch Place 1 patch (14 mg total) onto the skin daily. Patient not taking: Reported on 05/01/2020 02/02/20   Leatha Gilding, MD  thiamine 100 MG tablet Take 1 tablet (100 mg total) by mouth daily. 09/18/20   Arnetha Courser, MD  vitamin B-12 (CYANOCOBALAMIN) 1000 MCG tablet Take 1 tablet (1,000 mcg total) by mouth daily. 09/18/20   Arnetha Courser, MD    Allergies    Shellfish allergy  Review of Systems   Review of Systems  Physical Exam Updated Vital Signs BP (!) 138/92 (BP Location: Left Arm)   Pulse 62   Temp 98.7 F (37.1 C) (  Oral)   Resp 16   Ht 6\' 2"  (1.88 m)   Wt 85 kg   SpO2 98%   BMI 24.06 kg/m   Physical Exam  ED Results / Procedures / Treatments   Labs (all labs ordered are listed, but only abnormal results are displayed) Labs Reviewed  SARS CORONAVIRUS 2 (TAT 6-24 HRS)  POC SARS CORONAVIRUS 2 AG -  ED    EKG None  Radiology DG Cervical Spine Complete  Result Date: 11/08/2020 CLINICAL DATA:  Fall, body aches, chills, fever and shortness of breath EXAM: CERVICAL SPINE - COMPLETE 4+ VIEW; THORACIC SPINE 2 VIEWS COMPARISON:  CT cervical spine 09/16/2020, MR cervical spine  05/01/2020, CT chest 05/01/2020 FINDINGS: Cervical spine: Postsurgical changes from prior C5-C7 anterior cervical discectomy and fusion. No evidence of acute hardware complication or failure. Mild straightening of normal cervical lordosis. No evidence of traumatic listhesis. No abnormally widened, perched or jumped facets. Normal alignment of the craniocervical and atlantoaxial articulations. Dens is intact. No acute vertebral body fracture or height loss. Multilevel intervertebral disc height loss with spondylitic endplate changes. No severe canal or foraminal narrowing is radiographically evident. No prevertebral swelling or gas. Airways patent. No acute abnormality in the upper chest or imaged lung apices. Thoracic spine: T1-T3 are poorly visualized on lateral radiograph due to the shoulder soft tissues. No visible fracture or height loss in the remaining vertebral levels. Vertebral bodies and posterior elements appear grossly aligned. Mild levocurvature of the lower thoracic spine, apex T10. Included portions of the chest and abdomen are free of acute abnormality. IMPRESSION: 1. No acute fracture vertebral body height loss in the cervical or thoracic spine though the T1-T3 levels are poorly visualized. 2. Postsurgical changes from prior C5-C7 anterior cervical discectomy and fusion. No evidence of acute hardware complication or failure. 3. Please note: Spine radiography may have limited sensitivity and specificity in the setting of significant trauma. If there is significant mechanism and persisting clinical concern, recommend low threshold for CT imaging. Electronically Signed   By: 07/02/2020 M.D.   On: 11/08/2020 03:37   DG Thoracic Spine 2 View  Result Date: 11/08/2020 CLINICAL DATA:  Fall, body aches, chills, fever and shortness of breath EXAM: CERVICAL SPINE - COMPLETE 4+ VIEW; THORACIC SPINE 2 VIEWS COMPARISON:  CT cervical spine 09/16/2020, MR cervical spine 05/01/2020, CT chest 05/01/2020  FINDINGS: Cervical spine: Postsurgical changes from prior C5-C7 anterior cervical discectomy and fusion. No evidence of acute hardware complication or failure. Mild straightening of normal cervical lordosis. No evidence of traumatic listhesis. No abnormally widened, perched or jumped facets. Normal alignment of the craniocervical and atlantoaxial articulations. Dens is intact. No acute vertebral body fracture or height loss. Multilevel intervertebral disc height loss with spondylitic endplate changes. No severe canal or foraminal narrowing is radiographically evident. No prevertebral swelling or gas. Airways patent. No acute abnormality in the upper chest or imaged lung apices. Thoracic spine: T1-T3 are poorly visualized on lateral radiograph due to the shoulder soft tissues. No visible fracture or height loss in the remaining vertebral levels. Vertebral bodies and posterior elements appear grossly aligned. Mild levocurvature of the lower thoracic spine, apex T10. Included portions of the chest and abdomen are free of acute abnormality. IMPRESSION: 1. No acute fracture vertebral body height loss in the cervical or thoracic spine though the T1-T3 levels are poorly visualized. 2. Postsurgical changes from prior C5-C7 anterior cervical discectomy and fusion. No evidence of acute hardware complication or failure. 3. Please note: Spine radiography  may have limited sensitivity and specificity in the setting of significant trauma. If there is significant mechanism and persisting clinical concern, recommend low threshold for CT imaging. Electronically Signed   By: Kreg Shropshire M.D.   On: 11/08/2020 03:37   CT Cervical Spine Wo Contrast  Result Date: 11/08/2020 CLINICAL DATA:  35 year old male status post fall. Fever, shortness of breath, body aches prior ACDF. EXAM: CT CERVICAL SPINE WITHOUT CONTRAST TECHNIQUE: Multidetector CT imaging of the cervical spine was performed without intravenous contrast. Multiplanar CT  image reconstructions were also generated. COMPARISON:  Cervical radiographs earlier today. Cervical spine CT 09/16/2020 and earlier. FINDINGS: Alignment: Mildly improved cervical lordosis compared to November. Cervicothoracic junction alignment is within normal limits. Bilateral posterior element alignment is within normal limits. Skull base and vertebrae: Visualized skull base is intact. No atlanto-occipital dissociation. Preserved C1-C2 alignment. Postoperative details are below. No acute osseous abnormality identified. Soft tissues and spinal canal: No prevertebral fluid or swelling. No visible canal hematoma. Negative visible noncontrast neck soft tissues. Disc levels: C2-C3 through C4-C5 appears stable and negative aside from some chronic endplate spurring at the latter. C5-C6: Previous ACDF with no evidence of hardware loosening but no convincing interbody arthrodesis. Endplates here appears stable since November. C6-C7: Prior ACDF with no hardware loosening. Scant interbody arthrodesis at this level via left anterolateral endplate osteophyte which does appear improved since November (coronal image 14). Some increased interbody calcification elsewhere. C7-T1: Stable and negative. Upper chest: Apical paraseptal emphysema and mild scarring. Visible upper thoracic levels appear grossly intact, thoracic CT reported separately today. Other: Negative visible noncontrast posterior fossa. Visible mastoid air cells appear clear. IMPRESSION: 1. No acute osseous abnormality in the cervical spine. 2. Prior C5-C6 and C6-C7 ACDF with chronic pseudoarthrosis at C5-C6. Scant but improved interbody arthrodesis at C6-C7 since November. No evidence of hardware loosening. No significant adjacent segment disease suspected. 3. Apical emphysema. Electronically Signed   By: Odessa Fleming M.D.   On: 11/08/2020 05:34   CT Thoracic Spine Wo Contrast  Result Date: 11/08/2020 CLINICAL DATA:  35 year old male status post fall. Fever,  shortness of breath, body aches prior ACDF. EXAM: CT THORACIC SPINE WITHOUT CONTRAST TECHNIQUE: Multidetector CT images of the thoracic were obtained using the standard protocol without intravenous contrast. COMPARISON:  Cervical spine CT today reported separately. CTA chest 05/01/2020. FINDINGS: Limited cervical spine imaging: T1 only partially included on these images. But T1 and all of T2 were included on the cervical spine CT today also. Thoracic spine segmentation:  Appears to be normal. Alignment: Thoracic kyphosis appears stable since the July CTA and within normal limits. There is subtle dextroconvex upper and levoconvex lower thoracic scoliosis. No spondylolisthesis. Vertebrae: Thoracic levels appear intact and negative. Visible upper lumbar levels appear intact. Visible posterior ribs appear intact. No acute osseous abnormality identified. Paraspinal and other soft tissues: Apical paraseptal pulmonary emphysema and mild scarring. Respiratory motion in the lower lungs. Negative visible noncontrast mediastinum and upper abdominal viscera. Thoracic paraspinal soft tissues are within normal limits. Disc levels: Negative.  No CT evidence of thoracic spinal stenosis. IMPRESSION: 1. No osseous abnormality identified in the thoracic spine. 2. Apical Emphysema (ICD10-J43.9). Electronically Signed   By: Odessa Fleming M.D.   On: 11/08/2020 05:41   DG Chest Portable 1 View  Result Date: 11/07/2020 CLINICAL DATA:  Cough shortness of breath chills and body aches EXAM: PORTABLE CHEST 1 VIEW COMPARISON:  Chest radiograph September 16, 2020 FINDINGS: The heart size and mediastinal contours are within normal  limits. Both lungs are clear. The visualized skeletal structures are unremarkable. IMPRESSION: No active disease. Electronically Signed   By: Maudry MayhewJeffrey  Waltz MD   On: 11/07/2020 21:59    Procedures Procedures (including critical care time)  Medications Ordered in ED Medications  ibuprofen (ADVIL) tablet 600 mg (600  mg Oral Given 11/08/20 0336)    ED Course  I have reviewed the triage vital signs and the nursing notes.  Pertinent labs & imaging results that were available during my care of the patient were reviewed by me and considered in my medical decision making (see chart for details).    MDM Rules/Calculators/A&P                          *** Final Clinical Impression(s) / ED Diagnoses Final diagnoses:  None    Rx / DC Orders ED Discharge Orders    None

## 2020-11-08 NOTE — ED Provider Notes (Signed)
[facility] EMERGENCY DEPARTMENT Provider Note   CSN: [id number] Arrival date & time: 11/07/20  1908     History Chief Complaint  Patient presents with  . Body Aches/Chills/Cough    Michael Lester is a 35 y.o. male.  Patient to ED with symptoms of cough, sometimes productive, fever, body aches, chills that started yesterday. He reports he is COVID unvaccinated. No vomiting. He has not felt like eating or drinking. No chest pain or significant SOB. He states that he has neck and upper back pain from a fall earlier in the day after losing his balance. No syncope.   The history is provided by the patient. No language interpreter was used.       History reviewed. No pertinent past medical history.  Patient Active Problem List   Diagnosis Date Noted  . Abrasion of right knee   . AKI (acute kidney injury) (HCC)   . Polysubstance abuse (HCC)   . AMS (altered mental status) 09/16/2020  . Foreign body (FB) in soft tissue   . IVDU (intravenous drug user) 05/02/2020  . Laceration of foot 05/02/2020  . Elevated liver enzymes 05/02/2020  . Elevated troponin 05/02/2020  . Accidental drug overdose   . Non-traumatic rhabdomyolysis   . NSTEMI (non-ST elevated myocardial infarction) (HCC)   . Neck pain 05/01/2020  . Hepatitis C antibody positive in blood 01/21/2020  . Severe sepsis (HCC) 12/30/2019  . Abscess in epidural space of cervical spine 12/21/2019  . Opioid dependence, daily use (HCC) 12/21/2019  . Chronic hepatitis C without hepatic coma (HCC) 12/21/2019  . Osteomyelitis of C6-7 12/20/2019  . Injection of illicit drug within last 12 months 12/20/2019  . Syncope   . Multiple lacerations 11/17/2014    Past Surgical History:  Procedure Laterality Date  . ANTERIOR CERVICAL DECOMP/DISCECTOMY FUSION N/A 12/22/2019   Procedure: cervical five-six, cervical six-seven anterior cervical discectomy with interbody fusion;  Surgeon: Julio Sicks, MD;  Location: [facility] OR;   Service: Neurosurgery;  Laterality: N/A;  . LACERATION REPAIR N/A 11/16/2014   Procedure: REPAIR MULTIPLE LACERATIONS;  Surgeon: Melvenia Beam, MD;  Location: [facility] OR;  Service: ENT;  Laterality: N/A;  . LACERATION REPAIR Left 11/16/2014   Procedure: REPAIR MULTIPLE LACERATIONS;  Surgeon: Manus Rudd, MD;  Location: [facility] OR;  Service: General;  Laterality: Left;  irrigation and suturing of lacerations on upper body       No family history on file.  Social History   Tobacco Use  . Smoking status: Current Every Day Smoker    Packs/day: 0.50  . Smokeless tobacco: Never Used  Vaping Use  . Vaping Use: Never used  Substance Use Topics  . Alcohol use: Not Currently  . Drug use: Yes    Types: Marijuana, Heroin, Cocaine    Comment: heroin and fentanyl    Home Medications Prior to Admission medications   Medication Sig Start Date End Date Taking? Authorizing Provider  folic acid (FOLVITE) 1 MG tablet Take 1 tablet (1 mg total) by mouth daily. 09/18/20   Arnetha Courser, MD  gabapentin (NEURONTIN) 100 MG capsule Take 1 capsule (100 mg total) by mouth 3 (three) times daily. Patient not taking: Reported on 05/01/2020 02/02/20   Leatha Gilding, MD  nicotine (NICODERM CQ - DOSED IN MG/24 HOURS) 14 mg/24hr patch Place 1 patch (14 mg total) onto the skin daily. Patient not taking: Reported on 05/01/2020 02/02/20   Leatha Gilding, MD  thiamine 100 MG tablet Take 1 tablet (  100 mg total) by mouth daily. 09/18/20   Arnetha Courser, MD  vitamin B-12 (CYANOCOBALAMIN) 1000 MCG tablet Take 1 tablet (1,000 mcg total) by mouth daily. 09/18/20   Arnetha Courser, MD    Allergies    Shellfish allergy  Review of Systems   Review of Systems  Constitutional: Positive for chills and fever.  HENT: Positive for congestion.   Respiratory: Positive for cough.   Cardiovascular: Negative.  Negative for chest pain.  Gastrointestinal: Negative.  Negative for abdominal pain, diarrhea and vomiting.  Musculoskeletal:  Positive for back pain and neck pain.  Skin: Negative.  Negative for rash and wound.  Neurological: Positive for headaches. Negative for syncope and weakness.  Psychiatric/Behavioral: Negative for confusion.    Physical Exam Updated Vital Signs BP 134/83 (BP Location: Left Arm)   Pulse 68   Temp 98.1 F (36.7 C) (Oral)   Resp 16   Ht 6\' 2"  (1.88 m)   Wt 85 kg   SpO2 98%   BMI 24.06 kg/m   Physical Exam Vitals and nursing note reviewed.  Constitutional:      General: He is not in acute distress.    Appearance: Normal appearance. He is well-developed and well-nourished. He is ill-appearing. He is not toxic-appearing.  HENT:     Head: Normocephalic.     Mouth/Throat:     Mouth: Mucous membranes are moist.  Eyes:     Conjunctiva/sclera: Conjunctivae normal.  Cardiovascular:     Rate and Rhythm: Normal rate and regular rhythm.     Heart sounds: No murmur heard.   Pulmonary:     Effort: Pulmonary effort is normal.     Breath sounds: Normal breath sounds. No wheezing, rhonchi or rales.     Comments: Actively coughing Chest:     Chest wall: No tenderness.  Abdominal:     General: Bowel sounds are normal.     Palpations: Abdomen is soft.     Tenderness: There is no abdominal tenderness. There is no guarding or rebound.  Musculoskeletal:        General: Normal range of motion.     Cervical back: Normal range of motion and neck supple.     Comments: There is midline cervical and thoracic tenderness as well as paraspinal tenderness of neck and upper back. No bruising or swelling. NO step off deformities.  Grip weak on right, "chronic and unchanged" from previous condition per patient.   Skin:    General: Skin is warm and dry.     Findings: No rash.  Neurological:     Mental Status: He is alert and oriented to person, place, and time.     Sensory: No sensory deficit.  Psychiatric:        Mood and Affect: Mood and affect normal.     ED Results / Procedures / Treatments    Labs (all labs ordered are listed, but only abnormal results are displayed) Labs Reviewed  SARS CORONAVIRUS 2 (TAT 6-24 HRS)  POC SARS CORONAVIRUS 2 AG -  ED    EKG None  Radiology DG Chest Portable 1 View  Result Date: 11/07/2020 CLINICAL DATA:  Cough shortness of breath chills and body aches EXAM: PORTABLE CHEST 1 VIEW COMPARISON:  Chest radiograph September 16, 2020 FINDINGS: The heart size and mediastinal contours are within normal limits. Both lungs are clear. The visualized skeletal structures are unremarkable. IMPRESSION: No active disease. Electronically Signed   By: September 18, 2020 MD   On: 11/07/2020 21:59  Procedures Procedures (including critical care time)  Medications Ordered in ED Medications  ibuprofen (ADVIL) tablet 600 mg (has no administration in time range)    ED Course  I have reviewed the triage vital signs and the nursing notes.  Pertinent labs & imaging results that were available during my care of the patient were reviewed by me and considered in my medical decision making (see chart for details).    MDM Rules/Calculators/A&P                          Patient to ED with febrile  URI symptoms x 1 day. Unvaccinated, however, chart review shows diagnosed COVID infection 3 months ago. CXR negative. Viral panel pending.   Imagine of neck and thoracic back show no acute fracture of c-spine or t-spine however T 1-3 are poorly visualized. He has direct tenderness of this area. CT's ordered to fully evaluate.   Viral panel is negative. CXR negative. VSS.   CT's of c-spine and t-spine are nonacute. He can be discharged home with instructions for supportive care of viral URI and MSK neck and back pain. Ibuprofen provided for pain. Final Clinical Impression(s) / ED Diagnoses Final diagnoses:  None   1. Viral URI 2. Musculoskeletal pain  Rx / DC Orders ED Discharge Orders    None       Elpidio Anis, Cordelia Poche 11/12/20 0506    Dione Booze,  MD 11/12/20 [phone]

## 2021-02-01 ENCOUNTER — Emergency Department
Admission: EM | Admit: 2021-02-01 | Discharge: 2021-02-01 | Disposition: A | Discharge status: Home / Self Care | Payer: Self-pay | Attending: Emergency Medicine | Admitting: Emergency Medicine

## 2021-02-01 ENCOUNTER — Other Ambulatory Visit: Payer: Self-pay

## 2021-02-01 DIAGNOSIS — T40601A Poisoning by unspecified narcotics, accidental (unintentional), initial encounter: Secondary | ICD-10-CM

## 2021-02-01 DIAGNOSIS — R000 Tachycardia, unspecified: Secondary | ICD-10-CM | POA: Insufficient documentation

## 2021-02-01 DIAGNOSIS — F17210 Nicotine dependence, cigarettes, uncomplicated: Secondary | ICD-10-CM | POA: Insufficient documentation

## 2021-02-01 DIAGNOSIS — T402X1A Poisoning by other opioids, accidental (unintentional), initial encounter: Secondary | ICD-10-CM | POA: Insufficient documentation

## 2021-02-01 DIAGNOSIS — X58XXXA Exposure to other specified factors, initial encounter: Secondary | ICD-10-CM | POA: Insufficient documentation

## 2021-02-01 LAB — CBC WITH DIFFERENTIAL/PLATELET
Abs Immature Granulocytes: 0.08 10*3/uL — ABNORMAL HIGH (ref 0.00–0.07)
Basophils Absolute: 0.1 10*3/uL (ref 0.0–0.1)
Basophils Relative: 1 %
Eosinophils Absolute: 0.2 10*3/uL (ref 0.0–0.5)
Eosinophils Relative: 1 %
HCT: 37.1 % — ABNORMAL LOW (ref 39.0–52.0)
Hemoglobin: 12.3 g/dL — ABNORMAL LOW (ref 13.0–17.0)
Immature Granulocytes: 1 %
Lymphocytes Relative: 8 %
Lymphs Abs: 1.3 10*3/uL (ref 0.7–4.0)
MCH: 25.8 pg — ABNORMAL LOW (ref 26.0–34.0)
MCHC: 33.2 g/dL (ref 30.0–36.0)
MCV: 77.9 fL — ABNORMAL LOW (ref 80.0–100.0)
Monocytes Absolute: 1.3 10*3/uL — ABNORMAL HIGH (ref 0.1–1.0)
Monocytes Relative: 8 %
Neutro Abs: 13.6 10*3/uL — ABNORMAL HIGH (ref 1.7–7.7)
Neutrophils Relative %: 81 %
Platelets: 211 10*3/uL (ref 150–400)
RBC: 4.76 MIL/uL (ref 4.22–5.81)
RDW: 19.2 % — ABNORMAL HIGH (ref 11.5–15.5)
WBC: 16.6 10*3/uL — ABNORMAL HIGH (ref 4.0–10.5)
nRBC: 0 % (ref 0.0–0.2)

## 2021-02-01 LAB — COMPREHENSIVE METABOLIC PANEL
ALT: 64 U/L — ABNORMAL HIGH (ref 0–44)
AST: 43 U/L — ABNORMAL HIGH (ref 15–41)
Albumin: 4.7 g/dL (ref 3.5–5.0)
Alkaline Phosphatase: 101 U/L (ref 38–126)
Anion gap: 12 (ref 5–15)
BUN: 18 mg/dL (ref 6–20)
CO2: 21 mmol/L — ABNORMAL LOW (ref 22–32)
Calcium: 9.3 mg/dL (ref 8.9–10.3)
Chloride: 108 mmol/L (ref 98–111)
Creatinine, Ser: 1.5 mg/dL — ABNORMAL HIGH (ref 0.61–1.24)
GFR, Estimated: >60 mL/min (ref >60)
Glucose, Bld: 95 mg/dL (ref 70–99)
Potassium: 3.8 mmol/L (ref 3.5–5.1)
Sodium: 141 mmol/L (ref 135–145)
Total Bilirubin: 1.6 mg/dL — ABNORMAL HIGH (ref 0.3–1.2)
Total Protein: 8 g/dL (ref 6.5–8.1)

## 2021-02-01 LAB — ACETAMINOPHEN LEVEL: Acetaminophen (Tylenol), Serum: <10 ug/mL — ABNORMAL LOW (ref 10–30)

## 2021-02-01 LAB — SALICYLATE LEVEL: Salicylate Lvl: <7 mg/dL — ABNORMAL LOW (ref 7.0–30.0)

## 2021-02-01 MED ORDER — LACTATED RINGERS IV BOLUS
1000.0000 mL | Freq: Once | INTRAVENOUS | Status: AC
  Administered 2021-02-01: 1000 mL via INTRAVENOUS

## 2021-02-01 NOTE — ED Notes (Signed)
VOLUNTARY 

## 2021-02-01 NOTE — Discharge Instructions (Signed)
If you ever want any help with drug use, reach out to [facility] health services. They're a good Buyer, retail for mental health and substance abuse assistance.   Return to the ED with any thoughts of harming yourself, additional overdoses.

## 2021-02-01 NOTE — ED Notes (Signed)
Pt verbalized understanding of d/c instructions at this time. Pt denied further questions. Pt ambulatory to ED entrance, NAD noted, steady gait noted, RR even and unlabored at this time.

## 2021-02-01 NOTE — ED Triage Notes (Signed)
Pt arrived via [facility] from fire station with c/o drug overdose. Per EMS, pt brought to fire dept after snorting . of fentanyl. Per EMS, pt O2 sat in the 70s, given narcan and placed on 4L via [place]  Per EMS, after narcan pt was given 2mg  versed en route to facility.   Pt now on 4L, at 100%.  Per EMS, 138/86, HR 164, CBG 76

## 2021-02-01 NOTE — ED Provider Notes (Signed)
[facility] Emergency Department Provider Note ____________________________________________   Event Date/Time   First MD Initiated Contact with Patient 02/01/21 1618     (approximate)  I have reviewed the triage vital signs and the nursing notes.  HISTORY  Chief Complaint Drug Overdose   HPI Michael Lester is a 35 y.o. malewho presents to the ED for evaluation of accidental drug overdose.  Chart review indicates history of polysubstance abuse.  History of cervical osteomyelitis requiring surgical intervention last year.  Patient reports he has not used any IV drugs since that time.  Patient reports he was clean and abstinent from recreational drugs for multiple months over this winter, but unfortunately relapsing in the past 2 days.  He reports that this was precipitated by the death of his uncle, which was emotionally devastating to him.  Patient reports smoking methamphetamines yesterday and snorting fentanyl yesterday and today.  He reports using a very small amount, but accidentally "overdoing it" with his fentanyl dosing today.  He is tearful and reports significant regret in his relapse back and recreational opiate use.  He adamantly denies any intent of self-harm, suicidality, IVDU or coingestions today beyond fentanyl.  He denies any methamphetamines today.  EMS reports provided the patient to Narcan due to poor mental status and hypoxia with bradypnea, subsequently causing intermittent agitation necessitating 2 mg of Versed prior to arrival to the ED.  Here in the ED, patient reports significant regret of his decisions and he seems to have pretty good insight.  He reports that he is now living with a friend in [place], away from his previous connections here in [place] that remind him of drug use.  He reports motivating factors of having a young child and wanting to live.  He denies desire for rehab at this time.  No past medical history on  file.  Patient Active Problem List   Diagnosis Date Noted  . Abrasion of right knee   . AKI (acute kidney injury) (HCC)   . Polysubstance abuse (HCC)   . AMS (altered mental status) 09/16/2020  . Foreign body (FB) in soft tissue   . IVDU (intravenous drug user) 05/02/2020  . Laceration of foot 05/02/2020  . Elevated liver enzymes 05/02/2020  . Elevated troponin 05/02/2020  . Accidental drug overdose   . Non-traumatic rhabdomyolysis   . NSTEMI (non-ST elevated myocardial infarction) (HCC)   . Neck pain 05/01/2020  . Hepatitis C antibody positive in blood 01/21/2020  . Severe sepsis (HCC) 12/30/2019  . Abscess in epidural space of cervical spine 12/21/2019  . Opioid dependence, daily use (HCC) 12/21/2019  . Chronic hepatitis C without hepatic coma (HCC) 12/21/2019  . Osteomyelitis of C6-7 12/20/2019  . Injection of illicit drug within last 12 months 12/20/2019  . Syncope   . Multiple lacerations 11/17/2014    Past Surgical History:  Procedure Laterality Date  . ANTERIOR CERVICAL DECOMP/DISCECTOMY FUSION N/A 12/22/2019   Procedure: cervical five-six, cervical six-seven anterior cervical discectomy with interbody fusion;  Surgeon: Julio Sicks, MD;  Location: [facility] OR;  Service: Neurosurgery;  Laterality: N/A;  . LACERATION REPAIR N/A 11/16/2014   Procedure: REPAIR MULTIPLE LACERATIONS;  Surgeon: Melvenia Beam, MD;  Location: [facility] OR;  Service: ENT;  Laterality: N/A;  . LACERATION REPAIR Left 11/16/2014   Procedure: REPAIR MULTIPLE LACERATIONS;  Surgeon: Manus Rudd, MD;  Location: [facility] OR;  Service: General;  Laterality: Left;  irrigation and suturing of lacerations on upper body    Prior to Admission  medications   Medication Sig Start Date End Date Taking? Authorizing Provider  benzonatate (TESSALON) 100 MG capsule Take 1 capsule (100 mg total) by mouth every 8 (eight) hours. 11/08/20   Elpidio Anis, PA-C  folic acid (FOLVITE) 1 MG tablet Take 1 tablet (1 mg total) by mouth daily.  09/18/20   Arnetha Courser, MD  gabapentin (NEURONTIN) 100 MG capsule Take 1 capsule (100 mg total) by mouth 3 (three) times daily. Patient not taking: Reported on 05/01/2020 02/02/20   Leatha Gilding, MD  ibuprofen (ADVIL) 600 MG tablet Take 1 tablet (600 mg total) by mouth every 6 (six) hours as needed. 11/08/20   Elpidio Anis, PA-C  nicotine (NICODERM CQ - DOSED IN MG/24 HOURS) 14 mg/24hr patch Place 1 patch (14 mg total) onto the skin daily. Patient not taking: Reported on 05/01/2020 02/02/20   Leatha Gilding, MD  thiamine 100 MG tablet Take 1 tablet (100 mg total) by mouth daily. 09/18/20   Arnetha Courser, MD  vitamin B-12 (CYANOCOBALAMIN) 1000 MCG tablet Take 1 tablet (1,000 mcg total) by mouth daily. 09/18/20   Arnetha Courser, MD    Allergies Shellfish allergy  No family history on file.  Social History Social History   Tobacco Use  . Smoking status: Current Every Day Smoker    Packs/day: 0.50  . Smokeless tobacco: Never Used  Vaping Use  . Vaping Use: Never used  Substance Use Topics  . Alcohol use: Not Currently  . Drug use: Yes    Types: Marijuana, Heroin, Cocaine    Comment: heroin and fentanyl    Review of Systems  Constitutional: No fever/chills Eyes: No visual changes. ENT: No sore throat. Cardiovascular: Denies chest pain. Respiratory: Denies shortness of breath. Gastrointestinal: No abdominal pain.  No nausea, no vomiting.  No diarrhea.  No constipation. Genitourinary: Negative for dysuria. Musculoskeletal: Negative for back pain. Skin: Negative for rash. Neurological: Negative for headaches, focal weakness or numbness.  ____________________________________________   PHYSICAL EXAM:  VITAL SIGNS: Vitals:   02/01/21 1856 02/01/21 2007  BP: (!) 156/141 137/89  Pulse: (!) 128 (!) 120  Resp: 19 18  Temp:    SpO2: 100% 100%      Constitutional: Alert and oriented. Well appearing and in no acute distress. Eyes: Conjunctivae are normal. PERRL.  EOMI. Head: Atraumatic. Nose: No congestion/rhinnorhea. Mouth/Throat: Mucous membranes are moist.  Oropharynx non-erythematous. Neck: No stridor. No cervical spine tenderness to palpation. Cardiovascular: Tachycardic rate, regular rhythm. Grossly normal heart sounds.  Good peripheral circulation. Respiratory: Normal respiratory effort.  No retractions. Lungs CTAB. Gastrointestinal: Soft , nondistended, nontender to palpation. No CVA tenderness. Musculoskeletal: No lower extremity tenderness nor edema.  No joint effusions. No signs of acute trauma. Neurologic:  Normal speech and language. No gross focal neurologic deficits are appreciated. No gait instability noted. Cranial nerves II through XII intact 5/5 strength and sensation in all 4 extremities Skin:  Skin is warm, dry and intact. No rash noted. Psychiatric: Mood and affect are normal. Speech and behavior are normal.  ____________________________________________   LABS (all labs ordered are listed, but only abnormal results are displayed)  Labs Reviewed  CBC WITH DIFFERENTIAL/PLATELET - Abnormal; Notable for the following components:      Result Value   WBC 16.6 (*)    Hemoglobin 12.3 (*)    HCT 37.1 (*)    MCV 77.9 (*)    MCH 25.8 (*)    RDW 19.2 (*)    Neutro Abs 13.6 (*)  Monocytes Absolute 1.3 (*)    Abs Immature Granulocytes 0.08 (*)    All other components within normal limits  COMPREHENSIVE METABOLIC PANEL - Abnormal; Notable for the following components:   CO2 21 (*)    Creatinine, Ser 1.50 (*)    AST 43 (*)    ALT 64 (*)    Total Bilirubin 1.6 (*)    All other components within normal limits  ACETAMINOPHEN LEVEL - Abnormal; Notable for the following components:   Acetaminophen (Tylenol), Serum <10 (*)    All other components within normal limits  SALICYLATE LEVEL - Abnormal; Notable for the following components:   Salicylate Lvl <7.0 (*)    All other components within normal limits    ____________________________________________  12 Lead EKG  Sinus rhythm, rate of 148 bpm.  Normal axis and intervals.  No evidence of acute ischemia.  Sinus tachycardia. ____________________________________________   PROCEDURES and INTERVENTIONS  Procedure(s) performed (including Critical Care):  Procedures  Medications  lactated ringers bolus 1,000 mL (0 mLs Intravenous Stopped 02/01/21 1820)    ____________________________________________   MDM / ED COURSE   35 year old male who is quite pleasant with good insight presents to the ED after accidental opiate overdose and ultimately amenable to outpatient management.  Presents tachycardic, resolving after IVF, and otherwise hemodynamically stable.  Blood work with mild metabolic derangements, likely due to volume depletion, and mild leukocytosis likely reactive in the setting of physiologic stressors today.  EKG is nonischemic and a sinus tachycardia.  Patient required Narcan in the field, but did not require any repeat dosing here in the ED. He has good insight into his condition without intent of self-harm, suicidality, hallucinations or evidence of psychiatric emergency necessitate IVC or emergent psychiatric evaluation.  He is requesting outpatient management and I provide him with local resources to follow-up with subs abuse and counseling.  We discussed abstinence and we discussed return precautions for the ED.  After a 4-hour observation period, patient is stable for outpatient management.   Clinical Course as of 02/01/21 2334  Wed Feb 01, 2021  1854 Heart rate in the mid 90s upon my reevaluation.  He reports he is doing well.  He is sitting up in the side of the bed and has been pleasantly observing the ED around him over the past hour.  Drinking multiple cups of water without difficulty or distress.  No emesis. [DS]  1935 Pt pulls me aside and says thank you for care and not looking down on him. Awaiting his ride, planning for  8p discharge [DS]    Clinical Course User Index [DS] Delton Prairie, MD    ____________________________________________   FINAL CLINICAL IMPRESSION(S) / ED DIAGNOSES  Final diagnoses:  Opiate overdose, accidental or unintentional, initial encounter [facility])     ED Discharge Orders    None       Donaven Criswell   Note:  This document was prepared using Dragon voice recognition software and may include unintentional dictation errors.   Delton Prairie, MD 02/01/21 [phone]

## 2021-02-12 ENCOUNTER — Other Ambulatory Visit: Payer: Self-pay

## 2021-02-12 ENCOUNTER — Inpatient Hospital Stay
Admission: EM | Admit: 2021-02-12 | Discharge: 2021-02-26 | DRG: 917 | Disposition: E | Payer: Self-pay | Attending: Internal Medicine | Admitting: Internal Medicine

## 2021-02-12 ENCOUNTER — Inpatient Hospital Stay: Payer: Self-pay

## 2021-02-12 DIAGNOSIS — E871 Hypo-osmolality and hyponatremia: Secondary | ICD-10-CM | POA: Diagnosis present

## 2021-02-12 DIAGNOSIS — E872 Acidosis: Secondary | ICD-10-CM | POA: Diagnosis present

## 2021-02-12 DIAGNOSIS — R0602 Shortness of breath: Secondary | ICD-10-CM

## 2021-02-12 DIAGNOSIS — I252 Old myocardial infarction: Secondary | ICD-10-CM

## 2021-02-12 DIAGNOSIS — I2510 Atherosclerotic heart disease of native coronary artery without angina pectoris: Secondary | ICD-10-CM | POA: Diagnosis present

## 2021-02-12 DIAGNOSIS — T43621A Poisoning by amphetamines, accidental (unintentional), initial encounter: Secondary | ICD-10-CM | POA: Diagnosis present

## 2021-02-12 DIAGNOSIS — F17210 Nicotine dependence, cigarettes, uncomplicated: Secondary | ICD-10-CM | POA: Diagnosis present

## 2021-02-12 DIAGNOSIS — F1510 Other stimulant abuse, uncomplicated: Secondary | ICD-10-CM | POA: Diagnosis present

## 2021-02-12 DIAGNOSIS — R0902 Hypoxemia: Secondary | ICD-10-CM

## 2021-02-12 DIAGNOSIS — N179 Acute kidney failure, unspecified: Secondary | ICD-10-CM

## 2021-02-12 DIAGNOSIS — Z515 Encounter for palliative care: Secondary | ICD-10-CM

## 2021-02-12 DIAGNOSIS — R1110 Vomiting, unspecified: Secondary | ICD-10-CM

## 2021-02-12 DIAGNOSIS — R7401 Elevation of levels of liver transaminase levels: Secondary | ICD-10-CM

## 2021-02-12 DIAGNOSIS — F1010 Alcohol abuse, uncomplicated: Secondary | ICD-10-CM | POA: Diagnosis present

## 2021-02-12 DIAGNOSIS — R6521 Severe sepsis with septic shock: Secondary | ICD-10-CM | POA: Diagnosis present

## 2021-02-12 DIAGNOSIS — F1110 Opioid abuse, uncomplicated: Secondary | ICD-10-CM | POA: Diagnosis present

## 2021-02-12 DIAGNOSIS — T40411A Poisoning by fentanyl or fentanyl analogs, accidental (unintentional), initial encounter: Principal | ICD-10-CM | POA: Diagnosis present

## 2021-02-12 DIAGNOSIS — Z66 Do not resuscitate: Secondary | ICD-10-CM | POA: Diagnosis present

## 2021-02-12 DIAGNOSIS — G9341 Metabolic encephalopathy: Secondary | ICD-10-CM | POA: Diagnosis present

## 2021-02-12 DIAGNOSIS — Z981 Arthrodesis status: Secondary | ICD-10-CM

## 2021-02-12 DIAGNOSIS — E878 Other disorders of electrolyte and fluid balance, not elsewhere classified: Secondary | ICD-10-CM | POA: Diagnosis present

## 2021-02-12 DIAGNOSIS — G928 Other toxic encephalopathy: Secondary | ICD-10-CM | POA: Diagnosis present

## 2021-02-12 DIAGNOSIS — E162 Hypoglycemia, unspecified: Secondary | ICD-10-CM | POA: Diagnosis present

## 2021-02-12 DIAGNOSIS — J9602 Acute respiratory failure with hypercapnia: Secondary | ICD-10-CM | POA: Diagnosis present

## 2021-02-12 DIAGNOSIS — E440 Moderate protein-calorie malnutrition: Secondary | ICD-10-CM | POA: Insufficient documentation

## 2021-02-12 DIAGNOSIS — A419 Sepsis, unspecified organism: Secondary | ICD-10-CM | POA: Diagnosis present

## 2021-02-12 DIAGNOSIS — J9601 Acute respiratory failure with hypoxia: Secondary | ICD-10-CM

## 2021-02-12 DIAGNOSIS — Z0189 Encounter for other specified special examinations: Secondary | ICD-10-CM

## 2021-02-12 DIAGNOSIS — Z20822 Contact with and (suspected) exposure to covid-19: Secondary | ICD-10-CM | POA: Diagnosis present

## 2021-02-12 DIAGNOSIS — J690 Pneumonitis due to inhalation of food and vomit: Secondary | ICD-10-CM | POA: Diagnosis present

## 2021-02-12 DIAGNOSIS — E875 Hyperkalemia: Secondary | ICD-10-CM | POA: Diagnosis present

## 2021-02-12 DIAGNOSIS — T50902A Poisoning by unspecified drugs, medicaments and biological substances, intentional self-harm, initial encounter: Secondary | ICD-10-CM

## 2021-02-12 DIAGNOSIS — K7290 Hepatic failure, unspecified without coma: Secondary | ICD-10-CM | POA: Diagnosis present

## 2021-02-12 DIAGNOSIS — F1210 Cannabis abuse, uncomplicated: Secondary | ICD-10-CM | POA: Diagnosis present

## 2021-02-12 DIAGNOSIS — I5021 Acute systolic (congestive) heart failure: Secondary | ICD-10-CM | POA: Diagnosis present

## 2021-02-12 DIAGNOSIS — T50901A Poisoning by unspecified drugs, medicaments and biological substances, accidental (unintentional), initial encounter: Secondary | ICD-10-CM | POA: Diagnosis present

## 2021-02-12 DIAGNOSIS — Z91013 Allergy to seafood: Secondary | ICD-10-CM

## 2021-02-12 DIAGNOSIS — B182 Chronic viral hepatitis C: Secondary | ICD-10-CM | POA: Diagnosis present

## 2021-02-12 DIAGNOSIS — J189 Pneumonia, unspecified organism: Secondary | ICD-10-CM | POA: Diagnosis present

## 2021-02-12 DIAGNOSIS — Z6821 Body mass index (BMI) 21.0-21.9, adult: Secondary | ICD-10-CM

## 2021-02-12 LAB — BLOOD GAS, VENOUS
Acid-base deficit: 16.9 mmol/L — ABNORMAL HIGH (ref 0.0–2.0)
Bicarbonate: 11.8 mmol/L — ABNORMAL LOW (ref 20.0–28.0)
O2 Saturation: 62.1 %
Patient temperature: 37
pCO2, Ven: 37 mmHg — ABNORMAL LOW (ref 44.0–60.0)
pH, Ven: 7.11 — CL (ref 7.250–7.430)
pO2, Ven: 45 mmHg (ref 32.0–45.0)

## 2021-02-12 LAB — CBC WITH DIFFERENTIAL/PLATELET
Abs Immature Granulocytes: 0.08 10*3/uL — ABNORMAL HIGH (ref 0.00–0.07)
Basophils Absolute: 0.1 10*3/uL (ref 0.0–0.1)
Basophils Relative: 0 %
Eosinophils Absolute: 0.2 10*3/uL (ref 0.0–0.5)
Eosinophils Relative: 1 %
HCT: 37.2 % — ABNORMAL LOW (ref 39.0–52.0)
Hemoglobin: 12.5 g/dL — ABNORMAL LOW (ref 13.0–17.0)
Immature Granulocytes: 1 %
Lymphocytes Relative: 6 %
Lymphs Abs: 0.8 10*3/uL (ref 0.7–4.0)
MCH: 26.5 pg (ref 26.0–34.0)
MCHC: 33.6 g/dL (ref 30.0–36.0)
MCV: 79 fL — ABNORMAL LOW (ref 80.0–100.0)
Monocytes Absolute: 1.8 10*3/uL — ABNORMAL HIGH (ref 0.1–1.0)
Monocytes Relative: 13 %
Neutro Abs: 11.4 10*3/uL — ABNORMAL HIGH (ref 1.7–7.7)
Neutrophils Relative %: 79 %
Platelets: 279 10*3/uL (ref 150–400)
RBC: 4.71 MIL/uL (ref 4.22–5.81)
RDW: 17.1 % — ABNORMAL HIGH (ref 11.5–15.5)
WBC: 14.3 10*3/uL — ABNORMAL HIGH (ref 4.0–10.5)
nRBC: 0 % (ref 0.0–0.2)

## 2021-02-12 LAB — COMPREHENSIVE METABOLIC PANEL
ALT: 253 U/L — ABNORMAL HIGH (ref 0–44)
ALT: 274 U/L — ABNORMAL HIGH (ref 0–44)
AST: 1385 U/L — ABNORMAL HIGH (ref 15–41)
AST: 1379 U/L — ABNORMAL HIGH (ref 15–41)
Albumin: 4.1 g/dL (ref 3.5–5.0)
Albumin: 3.8 g/dL (ref 3.5–5.0)
Alkaline Phosphatase: 152 U/L — ABNORMAL HIGH (ref 38–126)
Alkaline Phosphatase: 140 U/L — ABNORMAL HIGH (ref 38–126)
Anion gap: 28 — ABNORMAL HIGH (ref 5–15)
Anion gap: 25 — ABNORMAL HIGH (ref 5–15)
BUN: 89 mg/dL — ABNORMAL HIGH (ref 6–20)
BUN: 88 mg/dL — ABNORMAL HIGH (ref 6–20)
CO2: 12 mmol/L — ABNORMAL LOW (ref 22–32)
CO2: 10 mmol/L — ABNORMAL LOW (ref 22–32)
Calcium: 5.8 mg/dL — CL (ref 8.9–10.3)
Calcium: 5.2 mg/dL — CL (ref 8.9–10.3)
Chloride: 87 mmol/L — ABNORMAL LOW (ref 98–111)
Chloride: 91 mmol/L — ABNORMAL LOW (ref 98–111)
Creatinine, Ser: 7.49 mg/dL — ABNORMAL HIGH (ref 0.61–1.24)
Creatinine, Ser: 7.02 mg/dL — ABNORMAL HIGH (ref 0.61–1.24)
GFR, Estimated: 9 mL/min — ABNORMAL LOW (ref >60)
GFR, Estimated: 10 mL/min — ABNORMAL LOW (ref >60)
Glucose, Bld: 36 mg/dL — CL (ref 70–99)
Glucose, Bld: 130 mg/dL — ABNORMAL HIGH (ref 70–99)
Potassium: 7.4 mmol/L (ref 3.5–5.1)
Potassium: 6.5 mmol/L (ref 3.5–5.1)
Sodium: 127 mmol/L — ABNORMAL LOW (ref 135–145)
Sodium: 126 mmol/L — ABNORMAL LOW (ref 135–145)
Total Bilirubin: 1.5 mg/dL — ABNORMAL HIGH (ref 0.3–1.2)
Total Bilirubin: 1.3 mg/dL — ABNORMAL HIGH (ref 0.3–1.2)
Total Protein: 7.6 g/dL (ref 6.5–8.1)
Total Protein: 6.7 g/dL (ref 6.5–8.1)

## 2021-02-12 LAB — URINALYSIS, ROUTINE W REFLEX MICROSCOPIC
Bacteria, UA: NONE SEEN
Bilirubin Urine: NEGATIVE
Glucose, UA: 50 mg/dL — AB
Ketones, ur: 5 mg/dL — AB
Leukocytes,Ua: NEGATIVE
Nitrite: NEGATIVE
Protein, ur: >=300 mg/dL — AB
RBC / HPF: >50 RBC/hpf — ABNORMAL HIGH (ref 0–5)
Specific Gravity, Urine: 1.018 (ref 1.005–1.030)
WBC, UA: >50 WBC/hpf — ABNORMAL HIGH (ref 0–5)
pH: 5 (ref 5.0–8.0)

## 2021-02-12 LAB — BLOOD GAS, ARTERIAL
Acid-base deficit: 15.8 mmol/L — ABNORMAL HIGH (ref 0.0–2.0)
Bicarbonate: 13 mmol/L — ABNORMAL LOW (ref 20.0–28.0)
FIO2: 30
MECHVT: 500 mL
Mechanical Rate: 12
O2 Saturation: 98.3 %
PEEP: 5 cmH2O
Patient temperature: 37
pCO2 arterial: 41 mmHg (ref 32.0–48.0)
pH, Arterial: 7.11 — CL (ref 7.350–7.450)
pO2, Arterial: 143 mmHg — ABNORMAL HIGH (ref 83.0–108.0)

## 2021-02-12 LAB — PHOSPHORUS: Phosphorus: 14.3 mg/dL — ABNORMAL HIGH (ref 2.5–4.6)

## 2021-02-12 LAB — BRAIN NATRIURETIC PEPTIDE: B Natriuretic Peptide: 125.9 pg/mL — ABNORMAL HIGH (ref 0.0–100.0)

## 2021-02-12 LAB — CBG MONITORING, ED
Glucose-Capillary: 35 mg/dL — CL (ref 70–99)
Glucose-Capillary: 134 mg/dL — ABNORMAL HIGH (ref 70–99)

## 2021-02-12 LAB — SALICYLATE LEVEL: Salicylate Lvl: <7 mg/dL — ABNORMAL LOW (ref 7.0–30.0)

## 2021-02-12 LAB — RESP PANEL BY RT-PCR (FLU A&B, COVID) ARPGX2
Influenza A by PCR: NEGATIVE
Influenza B by PCR: NEGATIVE
SARS Coronavirus 2 by RT PCR: NEGATIVE

## 2021-02-12 LAB — TROPONIN I (HIGH SENSITIVITY)
Troponin I (High Sensitivity): 321 ng/L (ref <18)
Troponin I (High Sensitivity): 280 ng/L (ref <18)

## 2021-02-12 LAB — OSMOLALITY, URINE: Osmolality, Ur: 320 mOsm/kg (ref 300–900)

## 2021-02-12 LAB — URINE DRUG SCREEN, QUALITATIVE (ARMC ONLY)
Amphetamines, Ur Screen: POSITIVE — AB
Barbiturates, Ur Screen: NOT DETECTED
Benzodiazepine, Ur Scrn: NOT DETECTED
Cannabinoid 50 Ng, Ur ~~LOC~~: NOT DETECTED
Cocaine Metabolite,Ur ~~LOC~~: NOT DETECTED
MDMA (Ecstasy)Ur Screen: NOT DETECTED
Methadone Scn, Ur: POSITIVE — AB
Opiate, Ur Screen: NOT DETECTED
Phencyclidine (PCP) Ur S: NOT DETECTED
Tricyclic, Ur Screen: NOT DETECTED

## 2021-02-12 LAB — ACETAMINOPHEN LEVEL: Acetaminophen (Tylenol), Serum: <10 ug/mL — ABNORMAL LOW (ref 10–30)

## 2021-02-12 LAB — LIPASE, BLOOD: Lipase: 26 U/L (ref 11–51)

## 2021-02-12 LAB — OSMOLALITY: Osmolality: 304 mOsm/kg — ABNORMAL HIGH (ref 275–295)

## 2021-02-12 LAB — ETHANOL: Alcohol, Ethyl (B): <10 mg/dL (ref <10)

## 2021-02-12 LAB — SODIUM, URINE, RANDOM: Sodium, Ur: 34 mmol/L

## 2021-02-12 LAB — MAGNESIUM: Magnesium: 2.4 mg/dL (ref 1.7–2.4)

## 2021-02-12 MED ORDER — LORAZEPAM 2 MG/ML IJ SOLN
INTRAMUSCULAR | Status: AC
  Filled 2021-02-12: qty 2

## 2021-02-12 MED ORDER — POLYETHYLENE GLYCOL 3350 17 G PO PACK: 17.0000 g | PACK | Freq: Every day | ORAL | Status: DC | PRN

## 2021-02-12 MED ORDER — PATIROMER SORBITEX CALCIUM 8.4 G PO PACK
8.4000 g | PACK | Freq: Every day | ORAL | Status: DC
  Filled 2021-02-12 (×2): qty 1

## 2021-02-12 MED ORDER — LORAZEPAM 2 MG/ML IJ SOLN
INTRAMUSCULAR | Status: AC
  Filled 2021-02-12: qty 1

## 2021-02-12 MED ORDER — ORAL CARE MOUTH RINSE
15.0000 mL | OROMUCOSAL | Status: DC
  Administered 2021-02-13 – 2021-02-15 (×22): 15 mL via OROMUCOSAL
  Filled 2021-02-12 (×7): qty 15

## 2021-02-12 MED ORDER — MIDAZOLAM HCL 2 MG/2ML IJ SOLN
2.0000 mg | INTRAMUSCULAR | Status: DC | PRN
  Administered 2021-02-13 – 2021-02-14 (×6): 2 mg via INTRAVENOUS
  Filled 2021-02-12 (×6): qty 2

## 2021-02-12 MED ORDER — LORAZEPAM 1 MG PO TABS: 1.0000 mg | ORAL_TABLET | ORAL | Status: DC | PRN

## 2021-02-12 MED ORDER — THIAMINE HCL 100 MG/ML IJ SOLN: 100.0000 mg | Freq: Every day | INTRAMUSCULAR | Status: DC

## 2021-02-12 MED ORDER — THIAMINE HCL 100 MG PO TABS
100.0000 mg | ORAL_TABLET | Freq: Every day | ORAL | Status: DC
  Administered 2021-02-13 – 2021-02-14 (×2): 100 mg
  Filled 2021-02-12 (×2): qty 1

## 2021-02-12 MED ORDER — LORAZEPAM 2 MG/ML IJ SOLN
4.0000 mg | Freq: Once | INTRAMUSCULAR | Status: AC
  Administered 2021-02-12: 4 mg via INTRAMUSCULAR

## 2021-02-12 MED ORDER — FOLIC ACID 1 MG PO TABS
1.0000 mg | ORAL_TABLET | Freq: Every day | ORAL | Status: DC
  Administered 2021-02-13 – 2021-02-14 (×2): 1 mg
  Filled 2021-02-12 (×2): qty 1

## 2021-02-12 MED ORDER — SODIUM CHLORIDE 0.9 % IV BOLUS
1000.0000 mL | Freq: Once | INTRAVENOUS | Status: AC
  Administered 2021-02-12: 1000 mL via INTRAVENOUS

## 2021-02-12 MED ORDER — SODIUM CHLORIDE 0.9 % IV SOLN: INTRAVENOUS | Status: DC

## 2021-02-12 MED ORDER — SODIUM BICARBONATE 8.4 % IV SOLN
50.0000 meq | Freq: Once | INTRAVENOUS | Status: AC
  Administered 2021-02-12: 50 meq via INTRAVENOUS
  Filled 2021-02-12: qty 50

## 2021-02-12 MED ORDER — LORAZEPAM 2 MG/ML IJ SOLN
1.0000 mg | INTRAMUSCULAR | Status: DC | PRN
  Filled 2021-02-12: qty 1

## 2021-02-12 MED ORDER — FENTANYL BOLUS VIA INFUSION
50.0000 ug | INTRAVENOUS | Status: DC | PRN
  Filled 2021-02-12: qty 50

## 2021-02-12 MED ORDER — ADULT MULTIVITAMIN W/MINERALS CH: 1.0000 | ORAL_TABLET | Freq: Every day | ORAL | Status: DC

## 2021-02-12 MED ORDER — LORAZEPAM 2 MG/ML IJ SOLN
2.0000 mg | Freq: Once | INTRAMUSCULAR | Status: AC
  Administered 2021-02-12: 2 mg via INTRAMUSCULAR

## 2021-02-12 MED ORDER — DOCUSATE SODIUM 50 MG/5ML PO LIQD
100.0000 mg | Freq: Two times a day (BID) | ORAL | Status: DC
  Administered 2021-02-13 (×2): 100 mg
  Filled 2021-02-12 (×4): qty 10

## 2021-02-12 MED ORDER — FENTANYL CITRATE (PF) 100 MCG/2ML IJ SOLN: 50.0000 ug | Freq: Once | INTRAMUSCULAR | Status: DC

## 2021-02-12 MED ORDER — LORAZEPAM 2 MG/ML IJ SOLN: 2.0000 mg | Freq: Once | INTRAMUSCULAR | Status: DC

## 2021-02-12 MED ORDER — PANTOPRAZOLE SODIUM 40 MG IV SOLR
40.0000 mg | Freq: Every day | INTRAVENOUS | Status: DC
  Administered 2021-02-13 – 2021-02-14 (×3): 40 mg via INTRAVENOUS
  Filled 2021-02-12 (×3): qty 40

## 2021-02-12 MED ORDER — DEXTROSE 50 % IV SOLN
1.0000 | Freq: Once | INTRAVENOUS | Status: AC
  Administered 2021-02-12: 50 mL via INTRAVENOUS
  Filled 2021-02-12: qty 50

## 2021-02-12 MED ORDER — DEXTROSE 5 % IV SOLN: Freq: Once | INTRAVENOUS | Status: DC

## 2021-02-12 MED ORDER — FOLIC ACID 1 MG PO TABS: 1.0000 mg | ORAL_TABLET | Freq: Every day | ORAL | Status: DC

## 2021-02-12 MED ORDER — MIDAZOLAM HCL 2 MG/2ML IJ SOLN
2.0000 mg | INTRAMUSCULAR | Status: AC | PRN
  Administered 2021-02-13 (×3): 2 mg via INTRAVENOUS
  Filled 2021-02-12 (×3): qty 2

## 2021-02-12 MED ORDER — THIAMINE HCL 100 MG PO TABS: 100.0000 mg | ORAL_TABLET | Freq: Every day | ORAL | Status: DC

## 2021-02-12 MED ORDER — STERILE WATER FOR INJECTION IV SOLN
INTRAVENOUS | Status: DC
  Filled 2021-02-12: qty 150
  Filled 2021-02-12 (×2): qty 1000
  Filled 2021-02-12: qty 150
  Filled 2021-02-12: qty 1000
  Filled 2021-02-12: qty 150
  Filled 2021-02-12 (×4): qty 1000

## 2021-02-12 MED ORDER — FENTANYL 2500MCG IN NS 250ML (10MCG/ML) PREMIX INFUSION
25.0000 ug/h | INTRAVENOUS | Status: DC
  Administered 2021-02-12: 50 ug/h via INTRAVENOUS
  Administered 2021-02-13 – 2021-02-14 (×2): 250 ug/h via INTRAVENOUS
  Administered 2021-02-14 – 2021-02-15 (×2): 300 ug/h via INTRAVENOUS
  Filled 2021-02-12 (×6): qty 250

## 2021-02-12 MED ORDER — SODIUM BICARBONATE 8.4 % IV SOLN
50.0000 meq | Freq: Once | INTRAVENOUS | Status: AC
  Administered 2021-02-13: 50 meq via INTRAVENOUS
  Filled 2021-02-12: qty 50

## 2021-02-12 MED ORDER — ROCURONIUM BROMIDE 50 MG/5ML IV SOLN
1.0000 mg/kg | Freq: Once | INTRAVENOUS | Status: AC
  Administered 2021-02-12: 75 mg via INTRAVENOUS
  Filled 2021-02-12: qty 7.5

## 2021-02-12 MED ORDER — CHLORHEXIDINE GLUCONATE 0.12% ORAL RINSE (MEDLINE KIT)
15.0000 mL | Freq: Two times a day (BID) | OROMUCOSAL | Status: DC
  Administered 2021-02-13 – 2021-02-14 (×5): 15 mL via OROMUCOSAL
  Filled 2021-02-12 (×2): qty 15

## 2021-02-12 MED ORDER — ADULT MULTIVITAMIN W/MINERALS CH
1.0000 | ORAL_TABLET | Freq: Every day | ORAL | Status: DC
  Administered 2021-02-13 – 2021-02-14 (×2): 1
  Filled 2021-02-12 (×2): qty 1

## 2021-02-12 MED ORDER — CALCIUM GLUCONATE-NACL 2-0.675 GM/100ML-% IV SOLN
2.0000 g | Freq: Once | INTRAVENOUS | Status: AC
  Administered 2021-02-12: 2000 mg via INTRAVENOUS
  Filled 2021-02-12: qty 100

## 2021-02-12 MED ORDER — DOCUSATE SODIUM 100 MG PO CAPS: 100.0000 mg | ORAL_CAPSULE | Freq: Two times a day (BID) | ORAL | Status: DC | PRN

## 2021-02-12 MED ORDER — PROPOFOL 1000 MG/100ML IV EMUL
0.0000 ug/kg/min | INTRAVENOUS | Status: DC
  Administered 2021-02-13: 10 ug/kg/min via INTRAVENOUS
  Administered 2021-02-14: 20 ug/kg/min via INTRAVENOUS
  Filled 2021-02-12 (×3): qty 100

## 2021-02-12 MED ORDER — DEXTROSE 50 % IV SOLN
INTRAVENOUS | Status: AC
  Administered 2021-02-12: 50 mL via INTRAVENOUS
  Filled 2021-02-12: qty 50

## 2021-02-12 MED ORDER — HEPARIN SODIUM (PORCINE) 5000 UNIT/ML IJ SOLN
5000.0000 [IU] | Freq: Three times a day (TID) | INTRAMUSCULAR | Status: DC
  Administered 2021-02-13 – 2021-02-14 (×7): 5000 [IU] via SUBCUTANEOUS
  Filled 2021-02-12 (×7): qty 1

## 2021-02-12 MED ORDER — POLYETHYLENE GLYCOL 3350 17 G PO PACK
17.0000 g | PACK | Freq: Every day | ORAL | Status: DC
  Administered 2021-02-13 (×2): 17 g
  Filled 2021-02-12 (×2): qty 1

## 2021-02-12 MED ORDER — INSULIN ASPART 100 UNIT/ML IV SOLN
10.0000 [IU] | Freq: Once | INTRAVENOUS | Status: AC
  Administered 2021-02-12: 10 [IU] via INTRAVENOUS
  Filled 2021-02-12: qty 0.1

## 2021-02-12 MED ORDER — KETAMINE HCL 10 MG/ML IJ SOLN
1.0000 mg/kg | Freq: Once | INTRAMUSCULAR | Status: AC
  Administered 2021-02-12: 75 mg via INTRAVENOUS

## 2021-02-12 MED ORDER — THIAMINE HCL 100 MG/ML IJ SOLN
100.0000 mg | Freq: Every day | INTRAMUSCULAR | Status: DC
  Filled 2021-02-12: qty 2

## 2021-02-12 MED ORDER — SODIUM ZIRCONIUM CYCLOSILICATE 5 G PO PACK
10.0000 g | PACK | Freq: Every day | ORAL | Status: DC
  Administered 2021-02-13: 10 g
  Filled 2021-02-12 (×2): qty 1

## 2021-02-12 MED ORDER — DEXTROSE 50 % IV SOLN: 1.0000 | Freq: Once | INTRAVENOUS | Status: AC

## 2021-02-12 NOTE — ED Notes (Signed)
Called pharm for meds  

## 2021-02-12 NOTE — ED Notes (Signed)
Dr Vicente Males made aware of critical glucose, calcium, and troponin

## 2021-02-12 NOTE — H&P (Incomplete)
NAME:  Michael Lester, MRN:  [id number], DOB:  1986-09-06, LOS: 0 ADMISSION DATE:  02/09/2021, CONSULTATION DATE:  01/31/2021 REFERRING MD:  Dr. Cheri Fowler, CHIEF COMPLAINT:  Drug overdose   History of Present Illness:  35 year old male with known past medical history of polysubstance abuse including IV drug abuse, presenting to [facility] ED via EMS after unknown illicit substance ingestion.  Per ED documentation EMS reported the patient had been tachycardic and hypertensive throughout transport.  Patient's mental status has been such that no further history collected, suspected methamphetamine and fentanyl overdose. ED course: Per ED documentation patient has uncontrollable movements, unable to be redirected.  While in the ED the patient throwing himself on the floor, was banging head on boards of the bed and thrashing around.  He received 4 mg of Ativan IM, bed was removed and multiple formats placed for patient safety. Initial vitals: Febrile 100.8, tachypneic at 30, tachycardic at 111, hypotensive at 74/40 with SPO2 95% on room air.  Significant labs: Hyponatremic at 127, severely hyperkalemic at 7.4, hypochloremic at 87, high AG metabolic acidosis with serum CO2 at 12 and AG 28, acute renal failure with BUN/Cr 89/7.49, hypocalcemia at 5.8, Transaminitis: Alk phos 152, AST 1385, ALT 253, total bilirubin 1.5, BNP elevated at 125.9, troponin elevated at 321, leukocytosis at 14.3, osmolality gap Of 16 with serum osmolality at 304, serum alcohol less than 10, hypoglycemia 36.  VBG showing metabolic acidosis: 8.54/62/70/35.0. The patient continued to be altered, nonredirectable due to acute encephalopathy the decision was made for discussing the case with Dr. Cheri Fowler to emergently intubate the patient and placed on mechanical ventilation.  Dr. Juleen China was emergently consulted and is coming to assess the patient bedside.  PCCM consulted for admission. Pertinent  Medical History  Polysubstance abuse including IV  drugs, ETOH & Marijuana Seizures cervical osteomyelitis s/p surgical intervention 2021 Chronic Hep C Accidental drug overdose ( most recent 02/01/21) CAD Prior N-STEMI  Significant Hospital Events: Including procedures, antibiotic start and stop dates in addition to other pertinent events   . 01/30/2021- Admit to ICU with suspected fentanyl/methamphetamine OD and acute renal failure  Interim History / Subjective:  Patient lethargic post ativan lying on a mat on the floor. Care RN bedside reporting difficulty providing care due to agitation when stimulated. Unable to participate in care. Decision made to intubate patient with Dr. Cheri Fowler, ED MD.  Objective   Blood pressure 102/67, pulse 97, temperature (!) 100.8 F (38.2 C), temperature source Axillary, resp. rate 19, height _0  (1.88 m), weight 75 kg, SpO2 100 %.        Intake/Output Summary (Last 24 hours) at 02/23/2021 1945 Last data filed at 02/25/2021 1819 Gross per 24 hour  Intake 200 ml  Output -  Net 200 ml   Filed Weights   02/18/2021 1607  Weight: 75 kg    Examination: General: Adult male, critically ill, lying on floor mat, NAD HEENT: MM pink/moist, anicteric***, atraumatic, neck supple Neuro: lethargic, unable to follow commands, PERRL *** , MAE CV: s1s2 ***RRR, *** on monitor, no r/m/g Pulm: Regular, non labored on *** , breath sounds ***-BUL & ***-BLL GI: soft, ***, non***tender, bs x 4 Skin: limited physical exam- skin visible is covered in scratches/ ecchymosis/ abrasions all in differing levels of healing  Extremities: warm/dry, pulses + 2 R/P, *** edema noted  Labs/imaging that I have personally reviewed  (right click and "Reselect all SmartList Selections" daily)  EKG Interpretation Date:  02/04/2021 EKG Time:  17:35  Rate:  102 Rhythm: Sinus tachycardia QRS Axis:  RAD Intervals: non-specific intra-ventricular block, QRS prolonged and QTc prolonged ST/T Wave abnormalities: Brugada pattern type I in V1 in  the setting of hyperkalemia, diffuse ST depression Narrative Interpretation: Sinus Tachycardia with Brugada type 1 pattern and non specific intraventricular block in the setting of hyperkalemia  Na+/ K+: 127/ 7.4- treatment ordered BUN/Cr.: 89/ 7.49 Serum CO2/ AG: 12/ 28  Hgb: 12.5 Troponin: 321 >> 280 BNP: 125.9  WBC/ TMAX: 14.3/ 100.8 Lactic: pending  VBG: 7.11/37/45/11.8 CXR : pending  Resolved Hospital Problem list     Assessment & Plan:  Acute Renal Failure in the setting of suspected drug overdose High AG Metabolic Acidosis Acute Hyponatremia- 127 > 126 Hyperkalemia- 7.4 > 6.5 Hypocalcemia - 5.8 > 5.2 Cr on 02/01/21: 1.5 , Cr on VEHMCNOBS:9.62, salicylate level negative - Bicarb, calcium gluconate 2 g, insulin, D50 & lokelma ordered - 2nd L of NS bolus ordered - Strict I/O's: alert provider if UOP < 0.5 mL/kg/hr - gentle IVF hydration: NS @ 50 mL/h - Daily BMP, replace electrolytes PRN - Avoid nephrotoxic agents as able, ensure adequate renal perfusion - Nephrology following, appreciate input > spoke with Dr. Juleen China who suspects pre-renal insult, will hold off on emergent HD for tonight. Agrees with shifting measures, IVF & bicarb gtt - Obtain urine lytes - consider renal US  Acute Respiratory Failure secondary to Acute Encephalopathy in the setting of Drug Overdose  PMHx: polysubstance abuse (IVDU: methamphetamines & opioids & ETOH) - Ventilator settings: PRVC  8 mL/kg, 30% FiO2, 5 PEEP, continue ventilator support & lung protective strategies - Wean PEEP & FiO2 as tolerated, maintain SpO2 > 90% - Head of bed elevated 30 degrees, VAP protocol in place - Plateau pressures less than 30 cm H20  - Intermittent chest x-ray & ABG PRN - Daily WUA with SBT as tolerated  - Ensure adequate pulmonary hygiene  - F/u cultures, trend lactic (PCT unhelpful with acute renal failure) - PAD protocol in place: continue Fentanyl drip & Propofol drip  Transaminitis due to suspected  Shock liver in the setting of Chronic Hepatitis C - RUQ [place] - trend hepatic function - tylenol level negative   Best practice (right click and "Reselect all SmartList Selections" daily)  Diet:  NPO Pain/Anxiety/Delirium protocol (if indicated): Yes (RASS goal -2) VAP protocol (if indicated): Yes DVT prophylaxis: Subcutaneous Heparin GI prophylaxis: PPI Glucose control:  SSI No Central venous access:  N/A Arterial line:  N/A Foley:  Yes, and it is still needed Mobility:  bed rest  PT consulted: N/A Last date of multidisciplinary goals of care discussion 01/28/2021- pt unable to participate in care, called NOK in epic- father Code Status:  full code Disposition: ICU  Labs   CBC: Recent Labs  Lab 02/05/2021 1754  WBC 14.3*  NEUTROABS 11.4*  HGB 12.5*  HCT 37.2*  MCV 79.0*  PLT 836    Basic Metabolic Panel: Recent Labs  Lab 02/24/2021 1754  NA 127*  K 7.4*  CL 87*  CO2 12*  GLUCOSE 36*  BUN 89*  CREATININE 7.49*  CALCIUM 5.8*   GFR: Estimated Creatinine Clearance: 14.3 mL/min (A) (by C-G formula based on SCr of 7.49 mg/dL (H)). Recent Labs  Lab 02/17/2021 1754  WBC 14.3*    Liver Function Tests: Recent Labs  Lab 02/02/2021 1754  AST 1,385*  ALT 253*  ALKPHOS 152*  BILITOT 1.5*  PROT 7.6  ALBUMIN 4.1   No results for input(s): LIPASE,  AMYLASE in the last 168 hours. No results for input(s): AMMONIA in the last 168 hours.  ABG No results found for: PHART, PCO2ART, PO2ART, HCO3, TCO2, ACIDBASEDEF, O2SAT   Coagulation Profile: No results for input(s): INR, PROTIME in the last 168 hours.  Cardiac Enzymes: No results for input(s): CKTOTAL, CKMB, CKMBINDEX, TROPONINI in the last 168 hours.  HbA1C: Hgb A1c MFr Bld  Date/Time Value Ref Range Status  05/02/2020 01:11 AM 5.5 4.8 - 5.6 % Final    Comment:    (NOTE) Pre diabetes:          5.7%-6.4%  Diabetes:              >6.4%  Glycemic control for   <7.0% adults with diabetes     CBG: Recent Labs   Lab 02/23/2021 1836 02/08/2021 1933  GLUCAP 35* 134*    Review of Systems: Positives in BOLD ***  Gen: Denies fever, chills, weight change, fatigue, night sweats HEENT: Denies blurred vision, double vision, hearing loss, tinnitus, sinus congestion, rhinorrhea, sore throat, neck stiffness, dysphagia PULM: Denies shortness of breath, cough, sputum production, hemoptysis, wheezing CV: Denies chest pain, edema, orthopnea, paroxysmal nocturnal dyspnea, palpitations GI: Denies abdominal pain, nausea, vomiting, diarrhea, hematochezia, melena, constipation, change in bowel habits GU: Denies dysuria, hematuria, polyuria, oliguria, urethral discharge Endocrine: Denies hot or cold intolerance, polyuria, polyphagia or appetite change Derm: Denies rash, dry skin, scaling or peeling skin change Heme: Denies easy bruising, bleeding, bleeding gums Neuro: Denies headache, numbness, weakness, slurred speech, loss of memory or consciousness  Past Medical History:  He,  has no past medical history on file.   Surgical History:   Past Surgical History:  Procedure Laterality Date  . ANTERIOR CERVICAL DECOMP/DISCECTOMY FUSION N/A 12/22/2019   Procedure: cervical five-six, cervical six-seven anterior cervical discectomy with interbody fusion;  Surgeon: Earnie Larsson, MD;  Location: [place];  Service: Neurosurgery;  Laterality: N/A;  . LACERATION REPAIR N/A 11/16/2014   Procedure: REPAIR MULTIPLE LACERATIONS;  Surgeon: Ruby Cola, MD;  Location: [place];  Service: ENT;  Laterality: N/A;  . LACERATION REPAIR Left 11/16/2014   Procedure: REPAIR MULTIPLE LACERATIONS;  Surgeon: Donnie Mesa, MD;  Location: [place];  Service: General;  Laterality: Left;  irrigation and suturing of lacerations on upper body     Social History:   reports that he has been smoking. He has been smoking about 0.50 packs per day. He has never used smokeless tobacco. He reports previous alcohol use. He reports current drug use. Drugs: Marijuana,  Heroin, and Cocaine.   Family History:  His family history is not on file.   Allergies Allergies  Allergen Reactions  . Shellfish Allergy Anaphylaxis     Home Medications  Prior to Admission medications   Medication Sig Start Date End Date Taking? Authorizing Provider  benzonatate (TESSALON) 100 MG capsule Take 1 capsule (100 mg total) by mouth every 8 (eight) hours. 11/08/20   Charlann Lange, PA-C  folic acid (FOLVITE) 1 MG tablet Take 1 tablet (1 mg total) by mouth daily. 09/18/20   Lorella Nimrod, MD  gabapentin (NEURONTIN) 100 MG capsule Take 1 capsule (100 mg total) by mouth 3 (three) times daily. Patient not taking: Reported on 05/01/2020 02/02/20   Caren Griffins, MD  ibuprofen (ADVIL) 600 MG tablet Take 1 tablet (600 mg total) by mouth every 6 (six) hours as needed. 11/08/20   Charlann Lange, PA-C  nicotine (NICODERM CQ - DOSED IN MG/24 HOURS) 14 mg/24hr patch Place 1 patch (  14 mg total) onto the skin daily. Patient not taking: Reported on 05/01/2020 02/02/20   Caren Griffins, MD  thiamine 100 MG tablet Take 1 tablet (100 mg total) by mouth daily. 09/18/20   Lorella Nimrod, MD  vitamin B-12 (CYANOCOBALAMIN) 1000 MCG tablet Take 1 tablet (1,000 mcg total) by mouth daily. 09/18/20   Lorella Nimrod, MD     Critical care time: ***       Domingo Pulse [place], AGACNP-BC Acute Care Nurse Practitioner [facility]   [phone] / [phone] Please see Amion for pager details.

## 2021-02-12 NOTE — ED Notes (Signed)
Unable to keep pt hooked up at this time due to pt moving around and getting cords wrapped around his neck. Sitter remains within eyesight. Pt continues to thrash about violently.

## 2021-02-12 NOTE — Progress Notes (Signed)
RT Note: Patient intubated with Dr Vicente Males. Initial vent settings VT 500 R12 30% peep 5. ET tube 8.0 25 at lip. Per Dr Vicente Males, et tube was pulled back to 23 cm after xray review. Rate on vent was increased to 15 after post intubation abg. O2 decreased to 25%. Patient tolerated interventions well.

## 2021-02-12 NOTE — H&P (Addendum)
NAME:  Michael Lester, MRN:  [id number], DOB:  10-25-1986, LOS: 0 ADMISSION DATE:  01/28/2021, CONSULTATION DATE:  02/23/2021 REFERRING MD:  Dr. Cheri Fowler, CHIEF COMPLAINT:  Drug overdose   History of Present Illness:  35 year old male with known past medical history of polysubstance abuse including IV drug abuse, presenting to [facility] ED via EMS after unknown illicit substance ingestion.  Per ED documentation EMS reported the patient had been tachycardic and hypertensive throughout transport.  Patient's mental status has been such that no further history collected, suspected methamphetamine and fentanyl overdose. ED course: Per ED documentation patient has uncontrollable movements, unable to be redirected.  While in the ED the patient throwing himself on the floor, was banging head on boards of the bed and thrashing around.  He received 4 mg of Ativan IM, bed was removed and multiple formats placed for patient safety. Initial vitals: Febrile 100.8, tachypneic at 30, tachycardic at 111, hypotensive at 74/40 with SPO2 95% on room air.  Significant labs: Hyponatremic at 127, severely hyperkalemic at 7.4, hypochloremic at 87, high AG metabolic acidosis with serum CO2 at 12 and AG 28, acute renal failure with BUN/Cr 89/7.49, hypocalcemia at 5.8, Transaminitis: Alk phos 152, AST 1385, ALT 253, total bilirubin 1.5, BNP elevated at 125.9, troponin elevated at 321, leukocytosis at 14.3, osmolality gap Of 16 with serum osmolality at 304, serum alcohol less than 10, hypoglycemia 36.  VBG showing metabolic acidosis: 0.23/34/35/68.6. The patient continued to be altered, nonredirectable due to acute encephalopathy the decision was made for discussing the case with Dr. Cheri Fowler to emergently intubate the patient and placed on mechanical ventilation.  Dr. Juleen China was emergently consulted and is coming to assess the patient bedside.  PCCM consulted for admission. Pertinent  Medical History  Polysubstance abuse including IV  drugs, ETOH & Marijuana Seizures cervical osteomyelitis s/p surgical intervention 2021 Chronic Hep C Accidental drug overdose ( most recent 02/01/21) CAD Prior N-STEMI  Significant Hospital Events: Including procedures, antibiotic start and stop dates in addition to other pertinent events   . 01/27/2021- Admit to ICU with suspected fentanyl/methamphetamine OD and acute renal failure  Interim History / Subjective:  Patient lethargic post ativan lying on a mat on the floor. Care RN bedside reporting difficulty providing care due to agitation when stimulated. Unable to participate in care. Decision made to intubate patient with Dr. Cheri Fowler, ED MD.  Objective   Blood pressure 102/67, pulse 97, temperature (!) 100.8 F (38.2 C), temperature source Axillary, resp. rate 19, height 6' 2"  (1.88 m), weight 75 kg, SpO2 100 %.        Intake/Output Summary (Last 24 hours) at 02/01/2021 1945 Last data filed at 01/30/2021 1819 Gross per 24 hour  Intake 200 ml  Output --  Net 200 ml   Filed Weights   01/29/2021 1607  Weight: 75 kg    Examination: General: Adult male, critically ill, lying on floor mat, NAD HEENT: MM pink/dry, anicteric, atraumatic, neck supple Neuro: lethargic, unable to follow commands, PERRL +3, MAE CV: s1s2 RRR, ST on monitor, +systolic murmur auscultated no r/g Pulm: Regular, non labored on RA, breath sounds clear-dimished-BUL & diminished-BLL GI: soft, flat, non tender, bs x 4 Skin: limited physical exam- skin visible is covered in scratches/ ecchymosis/ abrasions all in differing levels of healing  Extremities: warm/dry, pulses + 2 R/P, trace edema noted  Labs/imaging that I have personally reviewed  (right click and "Reselect all SmartList Selections" daily)  EKG Interpretation Date:  02/21/2021 EKG Time:  17:35 Rate:  102 Rhythm: Sinus tachycardia QRS Axis:  RAD Intervals: non-specific intra-ventricular block, QRS prolonged and QTc prolonged ST/T Wave abnormalities:  Brugada pattern type I in V1 in the setting of hyperkalemia, diffuse ST depression, peaked T waves Narrative Interpretation: Sinus Tachycardia with Brugada type 1 pattern and non specific intraventricular block in the setting of hyperkalemia  Na+/ K+: 127/ 7.4- treatment ordered BUN/Cr.: 89/ 7.49 Serum CO2/ AG: 12/ 28  Hgb: 12.5 Troponin: 321 >> 280 BNP: 125.9  WBC/ TMAX: 14.3/ 100.8 Lactic: pending  VBG: 7.11/37/45/11.8 CXR : pending Resolved Hospital Problem list     Assessment & Plan:  Acute Renal Failure in the setting of suspected drug overdose High AG Metabolic Acidosis Acute Hyponatremia- 127 > 126 Hyperkalemia- 7.4 > 6.5 Hypocalcemia - 5.8 > 5.2 Cr on 02/01/21: 1.5 , Cr on ZOXWRUEAV:4.09, salicylate level negative - Bicarb, calcium gluconate 2 g, insulin, D50 & lokelma ordered - 2nd L of NS bolus ordered - repeat EKG 12 lead post shifting measures - Strict I/O's: alert provider if UOP < 0.5 mL/kg/hr - gentle IVF hydration: NS @ 50 mL/h - Frequent BMP until electrolytes stabilized, replace electrolytes PRN - Avoid nephrotoxic agents as able, ensure adequate renal perfusion - Nephrology following, appreciate input > spoke with Dr. Juleen China who suspects pre-renal insult, will hold off on emergent HD for tonight. Agrees with shifting measures, IVF & bicarb gtt - Obtain urine lytes - consider renal US  Acute Respiratory Failure secondary to Acute Encephalopathy in the setting of Drug Overdose  PMHx: polysubstance abuse (IVDU: methamphetamines & opioids & ETOH) - Ventilator settings: PRVC  8 mL/kg, 30% FiO2, 5 PEEP, continue ventilator support & lung protective strategies - Wean PEEP & FiO2 as tolerated, maintain SpO2 > 90% - Head of bed elevated 30 degrees, VAP protocol in place - Plateau pressures less than 30 cm H20  - Intermittent chest x-ray & ABG PRN - Daily WUA with SBT as tolerated  - Ensure adequate pulmonary hygiene  - F/u cultures, trend lactic (PCT unhelpful  with acute renal failure) - PAD protocol in place: continue Fentanyl drip & Propofol drip  Suspected Drug Overdose Acute encephalopathy  PMHx: polysubstance abuse, Seizures UDS pending, suspected fentanyl & methamphetamine overdose. "threw self onto floor" in ED per documentation Unclear if patient was taking antiepileptics regularly (prescribed gabapentin TID, but reported not taking this medication last July 2021) - CT head ordered - Seizure & fall precautions - supportive care - CIWA protocol in place with Ativan PRN - daily folic acid, thiamine, multivitamin  Leukocytosis Lactic pending, PCT unhelpful in the setting of acute renal failure. Patient borderline febrile at 100.8 on arrival to ED, meeting SIRS criteria with vitals initially but responded favorably to ativan. Will hold off on Abx for now, low threshold to start - f/u cultures - UA pending - CXR pending - trend lactic  Transaminitis due to suspected Shock liver in the setting of Chronic Hepatitis C - RUQ [place] - trend hepatic function - tylenol level negative  Elevated Troponin due to demand ischemia vs N-STEMI : 321 > 280 PMHx: CAD Due to relatively flat nature of troponin with peak at 321 in the setting of suspected drug overdose, acute renal failure and severe hyperkalemia with EKG changes I suspect demand ischemia and will hold off on heparin drip at this time - Echocardiogram ordered - continuous cardiac monitoring  Best practice (right click and "Reselect all SmartList Selections" daily)  Diet:  NPO Pain/Anxiety/Delirium protocol (if indicated):  Yes (RASS goal -2) VAP protocol (if indicated): Yes DVT prophylaxis: Subcutaneous Heparin GI prophylaxis: PPI Glucose control:  SSI No Central venous access:  N/A Arterial line:  N/A Foley:  Yes, and it is still needed Mobility:  bed rest  PT consulted: N/A Last date of multidisciplinary goals of care discussion 02/08/2021- pt unable to participate in care, called NOK  in epic- father Code Status:  full code Disposition: ICU  Labs   CBC: Recent Labs  Lab 02/23/2021 1754  WBC 14.3*  NEUTROABS 11.4*  HGB 12.5*  HCT 37.2*  MCV 79.0*  PLT 588    Basic Metabolic Panel: Recent Labs  Lab 01/27/2021 1754  NA 127*  K 7.4*  CL 87*  CO2 12*  GLUCOSE 36*  BUN 89*  CREATININE 7.49*  CALCIUM 5.8*   GFR: Estimated Creatinine Clearance: 14.3 mL/min (A) (by C-G formula based on SCr of 7.49 mg/dL (H)). Recent Labs  Lab 02/22/2021 1754  WBC 14.3*    Liver Function Tests: Recent Labs  Lab 02/01/2021 1754  AST 1,385*  ALT 253*  ALKPHOS 152*  BILITOT 1.5*  PROT 7.6  ALBUMIN 4.1   No results for input(s): LIPASE, AMYLASE in the last 168 hours. No results for input(s): AMMONIA in the last 168 hours.  ABG No results found for: PHART, PCO2ART, PO2ART, HCO3, TCO2, ACIDBASEDEF, O2SAT   Coagulation Profile: No results for input(s): INR, PROTIME in the last 168 hours.  Cardiac Enzymes: No results for input(s): CKTOTAL, CKMB, CKMBINDEX, TROPONINI in the last 168 hours.  HbA1C: Hgb A1c MFr Bld  Date/Time Value Ref Range Status  05/02/2020 01:11 AM 5.5 4.8 - 5.6 % Final    Comment:    (NOTE) Pre diabetes:          5.7%-6.4%  Diabetes:              >6.4%  Glycemic control for   <7.0% adults with diabetes     CBG: Recent Labs  Lab 02/06/2021 1836 02/08/2021 1933  GLUCAP 35* 134*    Review of Systems:   UTA- patient unable to participate   Past Medical History:  He,  has no past medical history on file.   Surgical History:   Past Surgical History:  Procedure Laterality Date  . ANTERIOR CERVICAL DECOMP/DISCECTOMY FUSION N/A 12/22/2019   Procedure: cervical five-six, cervical six-seven anterior cervical discectomy with interbody fusion;  Surgeon: Earnie Larsson, MD;  Location: [place];  Service: Neurosurgery;  Laterality: N/A;  . LACERATION REPAIR N/A 11/16/2014   Procedure: REPAIR MULTIPLE LACERATIONS;  Surgeon: Ruby Cola, MD;   Location: [place];  Service: ENT;  Laterality: N/A;  . LACERATION REPAIR Left 11/16/2014   Procedure: REPAIR MULTIPLE LACERATIONS;  Surgeon: Donnie Mesa, MD;  Location: [place];  Service: General;  Laterality: Left;  irrigation and suturing of lacerations on upper body     Social History:   reports that he has been smoking. He has been smoking about 0.50 packs per day. He has never used smokeless tobacco. He reports previous alcohol use. He reports current drug use. Drugs: Marijuana, Heroin, and Cocaine.   Family History:  His family history is not on file.   Allergies Allergies  Allergen Reactions  . Shellfish Allergy Anaphylaxis     Home Medications  Prior to Admission medications   Medication Sig Start Date End Date Taking? Authorizing Provider  benzonatate (TESSALON) 100 MG capsule Take 1 capsule (100 mg total) by mouth every 8 (eight) hours. 11/08/20  Charlann Lange, PA-C  folic acid (FOLVITE) 1 MG tablet Take 1 tablet (1 mg total) by mouth daily. 09/18/20   Lorella Nimrod, MD  gabapentin (NEURONTIN) 100 MG capsule Take 1 capsule (100 mg total) by mouth 3 (three) times daily. Patient not taking: Reported on 05/01/2020 02/02/20   Caren Griffins, MD  ibuprofen (ADVIL) 600 MG tablet Take 1 tablet (600 mg total) by mouth every 6 (six) hours as needed. 11/08/20   Charlann Lange, PA-C  nicotine (NICODERM CQ - DOSED IN MG/24 HOURS) 14 mg/24hr patch Place 1 patch (14 mg total) onto the skin daily. Patient not taking: Reported on 05/01/2020 02/02/20   Caren Griffins, MD  thiamine 100 MG tablet Take 1 tablet (100 mg total) by mouth daily. 09/18/20   Lorella Nimrod, MD  vitamin B-12 (CYANOCOBALAMIN) 1000 MCG tablet Take 1 tablet (1,000 mcg total) by mouth daily. 09/18/20   Lorella Nimrod, MD     Critical care time: 56 minutes       Venetia Night, AGACNP-BC Acute Care Nurse Practitioner [facility]   [phone] / [phone] Please see Amion for pager  details.

## 2021-02-12 NOTE — ED Notes (Addendum)
Pt fell asleep and vitals were taken. MD aware of BP and temp. Unable to get IV at this time due to pt waking back up and moving around.

## 2021-02-12 NOTE — ED Triage Notes (Addendum)
Pt comes EMS with possible meth and fentanyl OD. Pt has uncontrollable movements and is hard to redirect but is calm in words.

## 2021-02-12 NOTE — ED Notes (Signed)
Bed removed from room. Mattress on floor. Multiple fall mats cushioning floor and a second mattress to keep pt from hitting head on floor. Continues to roll around.

## 2021-02-12 NOTE — ED Notes (Signed)
Pt continues rolling around on floor banging head on tile. Difficult to redirect. Verbal order for 4mg  ativan given at this time in right gluteal area.

## 2021-02-12 NOTE — ED Notes (Signed)
Pt threw himself on the floor witnessed by tech. MD aware.

## 2021-02-12 NOTE — ED Notes (Signed)
Pt banging head on boards of bed. MD aware. Sitter at door watching pt.

## 2021-02-12 NOTE — ED Notes (Signed)
CRITICAL LAB: potassium is 6.5, calcium is 5.2,  Lab, Dr. Vicente Males notified, orders received

## 2021-02-12 NOTE — Consult Note (Signed)
PHARMACY CONSULT NOTE - FOLLOW UP  Pharmacy Consult for Electrolyte Monitoring and Replacement   Recent Labs: Potassium (mmol/L)  Date Value  02/05/2021 6.5 (HH)   Magnesium (mg/dL)  Date Value  72/90/2111 3.1 (H)   Calcium (mg/dL)  Date Value  55/20/8022 5.2 (LL)   Albumin (g/dL)  Date Value  33/61/2244 3.8   Phosphorus (mg/dL)  Date Value  97/53/0051 6.8 (H)   Sodium (mmol/L)  Date Value  01/29/2021 126 (L)     Assessment: 35 year old male with known past medical history of polysubstance abuse including IV drug abuse, presenting to [facility] ED via EMS after unknown illicit substance ingestion.  Goal of Therapy:  WNL  Plan:  Medical team ordered lokelma and Ca gluconate.  F/u with AM labs.   Ronnald Ramp ,PharmD Clinical Pharmacist 02/14/2021 9:52 PM

## 2021-02-12 NOTE — ED Notes (Signed)
Received novolog att

## 2021-02-12 NOTE — ED Notes (Signed)
Dr Vicente Males made aware of critical potassium

## 2021-02-12 NOTE — ED Provider Notes (Signed)
[facility] Emergency Department Provider Note   ____________________________________________   Event Date/Time   First MD Initiated Contact with Patient 02/20/2021 1554     (approximate)  I have reviewed the triage vital signs and the nursing notes.   HISTORY  Chief Complaint Drug Overdose    HPI Michael Lester is a 35 y.o. male with a past medical history of polysubstance abuse as well as IV drug abuse who presents via EMS after an unknown illicit substance ingestion.  EMS state patient has been tachycardic and hypertensive throughout their transport.  Further history and review of systems are unable to be obtained at this point given patient's mental status         History reviewed. No pertinent past medical history.  Patient Active Problem List   Diagnosis Date Noted  . Abrasion of right knee   . AKI (acute kidney injury) (HCC)   . Polysubstance abuse (HCC)   . AMS (altered mental status) 09/16/2020  . Foreign body (FB) in soft tissue   . IVDU (intravenous drug user) 05/02/2020  . Laceration of foot 05/02/2020  . Elevated liver enzymes 05/02/2020  . Elevated troponin 05/02/2020  . Accidental drug overdose   . Non-traumatic rhabdomyolysis   . NSTEMI (non-ST elevated myocardial infarction) (HCC)   . Neck pain 05/01/2020  . Hepatitis C antibody positive in blood 01/21/2020  . Severe sepsis (HCC) 12/30/2019  . Abscess in epidural space of cervical spine 12/21/2019  . Opioid dependence, daily use (HCC) 12/21/2019  . Chronic hepatitis C without hepatic coma (HCC) 12/21/2019  . Osteomyelitis of C6-7 12/20/2019  . Injection of illicit drug within last 12 months 12/20/2019  . Syncope   . Multiple lacerations 11/17/2014    Past Surgical History:  Procedure Laterality Date  . ANTERIOR CERVICAL DECOMP/DISCECTOMY FUSION N/A 12/22/2019   Procedure: cervical five-six, cervical six-seven anterior cervical discectomy with interbody fusion;  Surgeon:  Julio Sicks, MD;  Location: [facility] OR;  Service: Neurosurgery;  Laterality: N/A;  . LACERATION REPAIR N/A 11/16/2014   Procedure: REPAIR MULTIPLE LACERATIONS;  Surgeon: Melvenia Beam, MD;  Location[facility] OR;  Service: ENT;  Laterality: N/A;  . LACERATION REPAIR Left 11/16/2014   Procedure: REPAIR MULTIPLE LACERATIONS;  Surgeon: Manus Rudd, MD;  Location: [facility] OR;  Service: General;  Laterality: Left;  irrigation and suturing of lacerations on upper body    Prior to Admission medications   Medication Sig Start Date End Date Taking? Authorizing Provider  benzonatate (TESSALON) 100 MG capsule Take 1 capsule (100 mg total) by mouth every 8 (eight) hours. 11/08/20   Elpidio Anis, PA-C  folic acid (FOLVITE) 1 MG tablet Take 1 tablet (1 mg total) by mouth daily. 09/18/20   Arnetha Courser, MD  gabapentin (NEURONTIN) 100 MG capsule Take 1 capsule (100 mg total) by mouth 3 (three) times daily. Patient not taking: Reported on 05/01/2020 02/02/20   Leatha Gilding, MD  ibuprofen (ADVIL) 600 MG tablet Take 1 tablet (600 mg total) by mouth every 6 (six) hours as needed. 11/08/20   Elpidio Anis, PA-C  nicotine (NICODERM CQ - DOSED IN MG/24 HOURS) 14 mg/24hr patch Place 1 patch (14 mg total) onto the skin daily. Patient not taking: Reported on 05/01/2020 02/02/20   Leatha Gilding, MD  thiamine 100 MG tablet Take 1 tablet (100 mg total) by mouth daily. 09/18/20   Arnetha Courser, MD  vitamin B-12 (CYANOCOBALAMIN) 1000 MCG tablet Take 1 tablet (1,000 mcg total) by mouth daily.  09/18/20   Arnetha Courser, MD    Allergies Shellfish allergy  History reviewed. No pertinent family history.  Social History Social History   Tobacco Use  . Smoking status: Current Every Day Smoker    Packs/day: 0.50  . Smokeless tobacco: Never Used  Vaping Use  . Vaping Use: Never used  Substance Use Topics  . Alcohol use: Not Currently  . Drug use: Yes    Types: Marijuana, Heroin, Cocaine    Comment: heroin and fentanyl    Review  of Systems Unable to assess ____________________________________________   PHYSICAL EXAM:  VITAL SIGNS: ED Triage Vitals [02/23/2021 1607]  Enc Vitals Group     BP 118/89     Pulse Rate (!) 120     Resp (!) 25     Temp      Temp src      SpO2      Weight 165 lb 5.5 oz (75 kg)     Height 6\' 2"  (1.88 m)     Head Circumference      Peak Flow      Pain Score 0     Pain Loc      Pain Edu?      Excl. in GC?    Constitutional: Writhing around on bed acutely agitated and slurred speech. Eyes: Conjunctivae are injected. PERRL. Head: Multiple abrasions and contusions Nose: No congestion/rhinnorhea. Mouth/Throat: Mucous membranes are dry. Neck: Supple Cardiovascular: Tachycardic regular rhythm.  Good peripheral circulation. Respiratory: Increased respirations Gastrointestinal: No distention. Musculoskeletal: No obvious deformities Neurologic: Pressured garbled speech.  Moving all extremities spontaneously Skin:  Skin is warm and dry. No rash noted. Psychiatric: Uncooperative.  Yelling at staff  ____________________________________________   LABS (all labs ordered are listed, but only abnormal results are displayed)  Labs Reviewed - No data to display ____________________________________________  EKG  ED ECG REPORT I, , the attending physician, personally viewed and interpreted this ECG.  Date: 02/14/2021 EKG Time: 1735 Rate: 102 Rhythm: normal sinus rhythm QRS Axis: normal Intervals: normal ST/T Wave abnormalities: normal Narrative Interpretation: no evidence of acute ischemia   PROCEDURES  Procedure(s) performed (including Critical Care):  Procedure Name: Intubation Date/Time: 02/13/2021 3:56 PM Performed by: 02/14/2021, MD Pre-anesthesia Checklist: Patient identified, Patient being monitored, Emergency Drugs available, Timeout performed and Suction available Oxygen Delivery Method: Non-rebreather mask Preoxygenation: Pre-oxygenation with  100% oxygen Induction Type: Rapid sequence Ventilation: Mask ventilation without difficulty Laryngoscope Size: Glidescope Grade View: Grade I Tube size: 8.0 mm Number of attempts: 1 Airway Equipment and Method: Video-laryngoscopy Placement Confirmation: ETT inserted through vocal cords under direct vision,  CO2 detector and Breath sounds checked- equal and bilateral Secured at: 23 cm Tube secured with: ETT holder Dental Injury: Teeth and Oropharynx as per pre-operative assessment     .Critical Care Performed by: Merwyn Katos, MD Authorized by: Merwyn Katos, MD   Critical care provider statement:    Critical care time (minutes):  45   Critical care time was exclusive of:  Separately billable procedures and treating other patients   Critical care was necessary to treat or prevent imminent or life-threatening deterioration of the following conditions:  Cardiac failure, dehydration and metabolic crisis   Critical care was time spent personally by me on the following activities:  Discussions with consultants, evaluation of patient's response to treatment, examination of patient, ordering and performing treatments and interventions, ordering and review of laboratory studies, ordering and review of radiographic studies, pulse oximetry, re-evaluation  of patient's condition, obtaining history from patient or surrogate and review of old charts   I assumed direction of critical care for this patient from another provider in my specialty: no     Care discussed with: admitting provider   .1-3 Lead EKG Interpretation Performed by: Merwyn Katos, MD Authorized by: Merwyn Katos, MD     Interpretation: abnormal     ECG rate:  121   ECG rate assessment: tachycardic     Rhythm: sinus tachycardia     Ectopy: none     Conduction: normal       ____________________________________________   INITIAL IMPRESSION / ASSESSMENT AND PLAN / ED COURSE  As part of my medical decision making, I  reviewed the following data within the electronic MEDICAL RECORD NUMBER Nursing notes reviewed and incorporated, Labs reviewed, EKG interpreted, Old chart reviewed, and Notes from prior ED visits reviewed and incorporated      +agitation +diaphoresis +mydriasis +tachycardia Questionable sympathomimetic toxicity.  Airway maintained. I have low suspicion for thyroid storm, malignant hyperthermia, serotonin syndrome, anticholinergic toxicity, NMS, sepsis, hypothyroidism, ASA, ICH.  Workup: POCT glucose, CBC, BMP, CK, ASA, Salicylate, Lactate, UA. EKG to check for prolongation of QTc or QRS intervals. Interventions: Lorazepam 2mg  IM, bicarb drip, intubation Laboratory evaluation shows evidence of acute renal failure, acute hepatitis, and persistent altered mental status Disposition: Given patient's acute metabolic failure, patient will require admission to the ICU     ____________________________________________   FINAL CLINICAL IMPRESSION(S) / ED DIAGNOSES  Final diagnoses:  None     ED Discharge Orders    None       Note:  This document was prepared using Dragon voice recognition software and may include unintentional dictation errors.   , MD 02/13/21 [phone]

## 2021-02-13 ENCOUNTER — Inpatient Hospital Stay: Payer: Self-pay

## 2021-02-13 DIAGNOSIS — E440 Moderate protein-calorie malnutrition: Secondary | ICD-10-CM | POA: Insufficient documentation

## 2021-02-13 LAB — COMPREHENSIVE METABOLIC PANEL
ALT: 307 U/L — ABNORMAL HIGH (ref 0–44)
AST: 1328 U/L — ABNORMAL HIGH (ref 15–41)
Albumin: 3.3 g/dL — ABNORMAL LOW (ref 3.5–5.0)
Alkaline Phosphatase: 130 U/L — ABNORMAL HIGH (ref 38–126)
Anion gap: 22 — ABNORMAL HIGH (ref 5–15)
BUN: 103 mg/dL — ABNORMAL HIGH (ref 6–20)
CO2: 15 mmol/L — ABNORMAL LOW (ref 22–32)
Calcium: 5.5 mg/dL — CL (ref 8.9–10.3)
Chloride: 90 mmol/L — ABNORMAL LOW (ref 98–111)
Creatinine, Ser: 8.21 mg/dL — ABNORMAL HIGH (ref 0.61–1.24)
GFR, Estimated: 8 mL/min — ABNORMAL LOW (ref >60)
Glucose, Bld: 179 mg/dL — ABNORMAL HIGH (ref 70–99)
Potassium: 6.2 mmol/L — ABNORMAL HIGH (ref 3.5–5.1)
Sodium: 127 mmol/L — ABNORMAL LOW (ref 135–145)
Total Bilirubin: 1.2 mg/dL (ref 0.3–1.2)
Total Protein: 6.2 g/dL — ABNORMAL LOW (ref 6.5–8.1)

## 2021-02-13 LAB — BLOOD CULTURE ID PANEL (REFLEXED) - BCID2

## 2021-02-13 LAB — BASIC METABOLIC PANEL
Anion gap: 21 — ABNORMAL HIGH (ref 5–15)
BUN: 98 mg/dL — ABNORMAL HIGH (ref 6–20)
CO2: 16 mmol/L — ABNORMAL LOW (ref 22–32)
Calcium: 5.5 mg/dL — CL (ref 8.9–10.3)
Chloride: 91 mmol/L — ABNORMAL LOW (ref 98–111)
Creatinine, Ser: 7.54 mg/dL — ABNORMAL HIGH (ref 0.61–1.24)
GFR, Estimated: 9 mL/min — ABNORMAL LOW (ref >60)
Glucose, Bld: 157 mg/dL — ABNORMAL HIGH (ref 70–99)
Potassium: 6.6 mmol/L (ref 3.5–5.1)
Sodium: 128 mmol/L — ABNORMAL LOW (ref 135–145)

## 2021-02-13 LAB — CBC
HCT: 33.9 % — ABNORMAL LOW (ref 39.0–52.0)
Hemoglobin: 11.7 g/dL — ABNORMAL LOW (ref 13.0–17.0)
MCH: 26.8 pg (ref 26.0–34.0)
MCHC: 34.5 g/dL (ref 30.0–36.0)
MCV: 77.6 fL — ABNORMAL LOW (ref 80.0–100.0)
Platelets: 200 10*3/uL (ref 150–400)
RBC: 4.37 MIL/uL (ref 4.22–5.81)
RDW: 17.2 % — ABNORMAL HIGH (ref 11.5–15.5)
WBC: 8.5 10*3/uL (ref 4.0–10.5)
nRBC: 0 % (ref 0.0–0.2)

## 2021-02-13 LAB — MRSA PCR SCREENING: MRSA by PCR: POSITIVE — AB

## 2021-02-13 LAB — GLUCOSE, CAPILLARY
Glucose-Capillary: 109 mg/dL — ABNORMAL HIGH (ref 70–99)
Glucose-Capillary: 157 mg/dL — ABNORMAL HIGH (ref 70–99)
Glucose-Capillary: 200 mg/dL — ABNORMAL HIGH (ref 70–99)
Glucose-Capillary: 40 mg/dL — CL (ref 70–99)
Glucose-Capillary: 80 mg/dL (ref 70–99)
Glucose-Capillary: 95 mg/dL (ref 70–99)
Glucose-Capillary: 96 mg/dL (ref 70–99)

## 2021-02-13 LAB — HEPATITIS B CORE ANTIBODY, TOTAL: Hep B Core Total Ab: NONREACTIVE

## 2021-02-13 LAB — HEPATITIS C ANTIBODY: HCV Ab: REACTIVE — AB

## 2021-02-13 LAB — LACTIC ACID, PLASMA
Lactic Acid, Venous: 1.4 mmol/L (ref 0.5–1.9)
Lactic Acid, Venous: 1.6 mmol/L (ref 0.5–1.9)

## 2021-02-13 LAB — HEPATITIS B CORE ANTIBODY, IGM: Hep B C IgM: NONREACTIVE

## 2021-02-13 LAB — HIV ANTIBODY (ROUTINE TESTING W REFLEX): HIV Screen 4th Generation wRfx: NONREACTIVE

## 2021-02-13 LAB — MAGNESIUM: Magnesium: 2.6 mg/dL — ABNORMAL HIGH (ref 1.7–2.4)

## 2021-02-13 LAB — PHOSPHORUS: Phosphorus: 11.7 mg/dL — ABNORMAL HIGH (ref 2.5–4.6)

## 2021-02-13 LAB — HEPATITIS B SURFACE ANTIGEN: Hepatitis B Surface Ag: NONREACTIVE

## 2021-02-13 LAB — TRIGLYCERIDES: Triglycerides: 84 mg/dL (ref <150)

## 2021-02-13 MED ORDER — STERILE WATER FOR INJECTION IJ SOLN
INTRAMUSCULAR | Status: AC
  Filled 2021-02-13: qty 10

## 2021-02-13 MED ORDER — VITAL 1.5 CAL PO LIQD
1000.0000 mL | ORAL | Status: DC
  Administered 2021-02-13 – 2021-02-14 (×2): 1000 mL

## 2021-02-13 MED ORDER — CHLORHEXIDINE GLUCONATE CLOTH 2 % EX PADS
6.0000 | MEDICATED_PAD | Freq: Every day | CUTANEOUS | Status: DC
  Administered 2021-02-13 – 2021-02-14 (×2): 6 via TOPICAL

## 2021-02-13 MED ORDER — SODIUM CHLORIDE 0.9 % IV SOLN
2.0000 g | INTRAVENOUS | Status: DC
  Administered 2021-02-13 – 2021-02-14 (×2): 2 g via INTRAVENOUS
  Filled 2021-02-13 (×3): qty 20

## 2021-02-13 MED ORDER — LACTATED RINGERS IV BOLUS
1000.0000 mL | INTRAVENOUS | Status: AC
  Administered 2021-02-13: 1000 mL via INTRAVENOUS

## 2021-02-13 MED ORDER — CHLORHEXIDINE GLUCONATE CLOTH 2 % EX PADS: 6.0000 | MEDICATED_PAD | Freq: Every day | CUTANEOUS | Status: DC

## 2021-02-13 MED ORDER — FREE WATER
30.0000 mL | Status: DC
  Administered 2021-02-13 – 2021-02-14 (×8): 30 mL

## 2021-02-13 MED ORDER — DEXTROSE 50 % IV SOLN
1.0000 | Freq: Once | INTRAVENOUS | Status: AC
  Administered 2021-02-14: 50 mL via INTRAVENOUS
  Filled 2021-02-13: qty 50

## 2021-02-13 MED ORDER — CALCIUM GLUCONATE-NACL 2-0.675 GM/100ML-% IV SOLN
2.0000 g | Freq: Once | INTRAVENOUS | Status: AC
  Administered 2021-02-13: 2000 mg via INTRAVENOUS
  Filled 2021-02-13: qty 100

## 2021-02-13 MED ORDER — DEXTROSE 50 % IV SOLN
INTRAVENOUS | Status: AC
  Administered 2021-02-13: 50 mL
  Filled 2021-02-13: qty 50

## 2021-02-13 MED ORDER — INSULIN ASPART 100 UNIT/ML IV SOLN
10.0000 [IU] | Freq: Once | INTRAVENOUS | Status: AC
  Administered 2021-02-13: 10 [IU] via INTRAVENOUS
  Filled 2021-02-13: qty 0.1

## 2021-02-13 MED ORDER — SODIUM ZIRCONIUM CYCLOSILICATE 5 G PO PACK
10.0000 g | PACK | Freq: Four times a day (QID) | ORAL | Status: AC
  Administered 2021-02-13 – 2021-02-14 (×6): 10 g
  Filled 2021-02-13 (×5): qty 2

## 2021-02-13 MED ORDER — NOREPINEPHRINE 16 MG/250ML-% IV SOLN
0.0000 ug/min | INTRAVENOUS | Status: DC
  Administered 2021-02-13: 15 ug/min via INTRAVENOUS
  Administered 2021-02-14: 30 ug/min via INTRAVENOUS
  Administered 2021-02-14: 60 ug/min via INTRAVENOUS
  Administered 2021-02-14: 90 ug/min via INTRAVENOUS
  Administered 2021-02-14 – 2021-02-15 (×4): 200 ug/min via INTRAVENOUS
  Filled 2021-02-13 (×11): qty 250

## 2021-02-13 MED ORDER — VECURONIUM BROMIDE 10 MG IV SOLR
10.0000 mg | Freq: Once | INTRAVENOUS | Status: AC
  Administered 2021-02-13: 10 mg via INTRAVENOUS
  Filled 2021-02-13: qty 10

## 2021-02-13 MED ORDER — DEXTROSE 50 % IV SOLN
1.0000 | Freq: Once | INTRAVENOUS | Status: AC
  Administered 2021-02-13: 50 mL via INTRAVENOUS
  Filled 2021-02-13: qty 50

## 2021-02-13 MED ORDER — MUPIROCIN 2 % EX OINT
1.0000 "application " | TOPICAL_OINTMENT | Freq: Two times a day (BID) | CUTANEOUS | Status: DC
  Administered 2021-02-13 – 2021-02-14 (×3): 1 via NASAL
  Filled 2021-02-13 (×2): qty 22

## 2021-02-13 MED ORDER — IBUPROFEN 100 MG/5ML PO SUSP
400.0000 mg | Freq: Once | ORAL | Status: DC
  Filled 2021-02-13: qty 20

## 2021-02-13 NOTE — Progress Notes (Signed)
PHARMACY - PHYSICIAN COMMUNICATION CRITICAL VALUE ALERT - BLOOD CULTURE IDENTIFICATION (BCID)  Michael Lester is an 35 y.o. male who presented to [facility] on 02/04/2021 with a chief complaint of AMS, drug OD  Assessment:  4/17 blood cultures 1 of 4 GPC, Streptococcus species detected on BCID.  Patient with substance abuse disorder  Name of physician (or Provider) Contacted: Dr. Belia Heman  Current antibiotics: none  Changes to prescribed antibiotics recommended:  Recommendations accepted by provider - repeat blood cultures and start ceftriaxone  Results for orders placed or performed during the hospital encounter of 02/18/2021  Blood Culture ID Panel (Reflexed) (Collected: 02/17/2021  7:48 PM)  Result Value Ref Range   Enterococcus faecalis NOT DETECTED NOT DETECTED   Enterococcus Faecium NOT DETECTED NOT DETECTED   Listeria monocytogenes NOT DETECTED NOT DETECTED   Staphylococcus species NOT DETECTED NOT DETECTED   Staphylococcus aureus (BCID) NOT DETECTED NOT DETECTED   Staphylococcus epidermidis NOT DETECTED NOT DETECTED   Staphylococcus lugdunensis NOT DETECTED NOT DETECTED   Streptococcus species DETECTED (A) NOT DETECTED   Streptococcus agalactiae NOT DETECTED NOT DETECTED   Streptococcus pneumoniae NOT DETECTED NOT DETECTED   Streptococcus pyogenes NOT DETECTED NOT DETECTED   A.calcoaceticus-baumannii NOT DETECTED NOT DETECTED   Bacteroides fragilis NOT DETECTED NOT DETECTED   Enterobacterales NOT DETECTED NOT DETECTED   Enterobacter cloacae complex NOT DETECTED NOT DETECTED   Escherichia coli NOT DETECTED NOT DETECTED   Klebsiella aerogenes NOT DETECTED NOT DETECTED   Klebsiella oxytoca NOT DETECTED NOT DETECTED   Klebsiella pneumoniae NOT DETECTED NOT DETECTED   Proteus species NOT DETECTED NOT DETECTED   Salmonella species NOT DETECTED NOT DETECTED   Serratia marcescens NOT DETECTED NOT DETECTED   Haemophilus influenzae NOT DETECTED NOT DETECTED   Neisseria meningitidis NOT  DETECTED NOT DETECTED   Pseudomonas aeruginosa NOT DETECTED NOT DETECTED   Stenotrophomonas maltophilia NOT DETECTED NOT DETECTED   Candida albicans NOT DETECTED NOT DETECTED   Candida auris NOT DETECTED NOT DETECTED   Candida glabrata NOT DETECTED NOT DETECTED   Candida krusei NOT DETECTED NOT DETECTED   Candida parapsilosis NOT DETECTED NOT DETECTED   Candida tropicalis NOT DETECTED NOT DETECTED   Cryptococcus neoformans/gattii NOT DETECTED NOT DETECTED    Juliette Alcide, PharmD, BCPS.   Work Cell: [phone] 02/13/2021 10:09 AM

## 2021-02-13 NOTE — Progress Notes (Signed)
Initial Nutrition Assessment  DOCUMENTATION CODES:   Non-severe (moderate) malnutrition in context of chronic illness  INTERVENTION:   Vital 1.5@ 53ml/hr- Initiate at 50ml/hr and increase by 78ml/hr q 8 hours until goal rate is reached.   Pro-Source 105ml TID via tube, provides 40kcal and 11g of protein per serving   Propofol: 9.0 ml/hr- provides 238kcal/day   Free water flushes 41ml q4 hours to maintain tube patency   Regimen provides 2280kcal/day, 130g/day protein and 1250ml/day free water (with propofol provides 2518kcal/day)   Pt at high refeed risk; recommend monitor potassium, magnesium and phosphorus labs daily until stable  NUTRITION DIAGNOSIS:   Moderate Malnutrition related to social / environmental circumstances (substance abuse) as evidenced by mild fat depletion,moderate fat depletion,moderate muscle depletion.  GOAL:   Provide needs based on ASPEN/SCCM guidelines  MONITOR:   Skin,Vent status,Labs,Weight trends,TF tolerance,I & O's  REASON FOR ASSESSMENT:   Malnutrition Screening Tool    ASSESSMENT:   35 y/o male with h/o substance abuse, hepatitis C and NSTEMI who is admitted with overdose and AKI   Pt sedated and ventilated. OGT in place. Plan is to initiate tube feeds today. Pt is likely at refeed risk. Per chart, pt appears weight stable pta.   Medications reviewed and include: colace, folic acid, heparin, MVI, protonix, miralax, lokelma, thiamine, ceftriaxone, propofol, Na Bicarbonate  Labs reviewed: Na 127(L), K 6.2(H), Cl 90(L), BUN 103(H), creat 8.21(H), Ca 5.5(L) adj. 6.06(L), P 11.7(H), Mg 2.6(H), alkphos 130(H), AST 1328(H), ALT 307(H) cbgs- 200, 157, 109 x 24 hrs AIC 5.5- 05/02/20  Patient is currently intubated on ventilator support MV: 12.5 L/min Temp (24hrs), Avg:99.2 F (37.3 C), Min:93.56 F (34.2 C), Max:101.12 F (38.4 C)  Propofol: 9.0 ml/hr- provides 238kcal/day   MAP- >58-63mmHg  UOP-33ml   NUTRITION - FOCUSED PHYSICAL  EXAM:  Flowsheet Row Most Recent Value  Orbital Region Mild depletion  Upper Arm Region Moderate depletion  Thoracic and Lumbar Region Mild depletion  Buccal Region Mild depletion  Temple Region Severe depletion  Clavicle Bone Region Moderate depletion  Clavicle and Acromion Bone Region Mild depletion  Scapular Bone Region Unable to assess  Dorsal Hand Unable to assess  Patellar Region Moderate depletion  Anterior Thigh Region Moderate depletion  Posterior Calf Region Moderate depletion  Edema (RD Assessment) None  Hair Reviewed  Eyes Reviewed  Mouth Reviewed  Skin Reviewed  Nails Reviewed     Diet Order:   Diet Order            Diet NPO time specified  Diet effective now                EDUCATION NEEDS:   No education needs have been identified at this time  Skin:  Skin Assessment: Reviewed RN Assessment  Last BM:  4/17  Height:   Ht Readings from Last 1 Encounters:  02/13/21 6\' 2"  (1.88 m)    Weight:   Wt Readings from Last 1 Encounters:  02/13/21 75 kg    Ideal Body Weight:  86.36 kg  BMI:  Body mass index is 21.23 kg/m.  Estimated Nutritional Needs:   Kcal:  2265kcal/day  Protein:  115-130g/day  Fluid:  2.3-2.6L/day  01/30/2021 MS, RD, LDN Please refer to [facility] for RD and/or RD on-call/weekend/after hours pager

## 2021-02-13 NOTE — Procedures (Addendum)
Central Venous Catheter Insertion Procedure Note  Michael Lester  [id number]  11/28/1985  Date:02/13/21  Time:1:26 AM   Provider Performing:Britton L Rust-Chester   Procedure: Insertion of Non-tunneled Central Venous [phone]) with US guidance ([id number])   Indication(s) Medication administration and Difficult access  Consent Risks of the procedure as well as the alternatives and risks of each were explained to the patient and/or caregiver.  Consent for the procedure was obtained and is signed in the bedside chart  Anesthesia Topical only with 1% lidocaine   Timeout Verified patient identification, verified procedure, site/side was marked, verified correct patient position, special equipment/implants available, medications/allergies/relevant history reviewed, required imaging and test results available.  Sterile Technique Maximal sterile technique including full sterile barrier drape, hand hygiene, sterile gown, sterile gloves, mask, hair covering, sterile ultrasound probe cover (if used).  Procedure Description Area of catheter insertion was cleaned with chlorhexidine and draped in sterile fashion.  With real-time ultrasound guidance a central venous catheter was placed into the left internal jugular vein. Nonpulsatile blood flow and easy flushing noted in all ports.  The catheter was sutured in place and sterile dressing applied.  Complications/Tolerance None; patient tolerated the procedure well. Chest X-ray is ordered to verify placement for internal jugular or subclavian cannulation.   Chest x-ray is not ordered for femoral cannulation.  EBL Minimal  Specimen(s) None   Michael Lester, AGACNP-BC Acute Care Nurse Practitioner [facility]   [phone] / [phone] Please see Amion for pager details.   I was immediately available for procedure   Lucie Leather, M.D.  [facility]  Medical  Director [facility] Medical Director [facility] Cardio-Pulmonary Department

## 2021-02-13 NOTE — Procedures (Signed)
Central Venous Dailysis Catheter Placement: TRIPLE LUMEN    Indication(s) Hemo Dialysis/CRRT  Consent verbal from father Risks of the procedure as well as the alternatives and risks of each were explained to the patient and/or caregiver.  Consent for the procedure was obtained and is signed in the bedside chart  Consent:emergent   Anesthesia Topical only with 1% lidocaine    Timeout Verified patient identification, verified procedure, site/side was marked, verified correct patient position, special equipment/implants available, medications/allergies/relevant history reviewed, required imaging and test results available. Patient comfort was obtained.     Sterile Technique Maximal sterile technique including full sterile barrier drape, hand hygiene, sterile gown, sterile gloves, mask, hair covering, sterile ultrasound probe cover (if used).   Hand washing performed prior to starting the procedure.    Procedure Description Area of catheter insertion was cleaned with chlorhexidine and draped in sterile fashion.  With real-time ultrasound guidance a central venous catheter was placed into the vein. Nonpulsatile blood flow and easy flushing noted in all ports.  The catheter was sutured in place and sterile dressing applied.   A triple lumen catheter was placed in RT femoral Vein There was good blood return, catheter caps were placed on lumens, catheter flushed easily, the line was secured and a sterile dressing and BIO-PATCH applied.   Ultrasound was used to visualize vasculature and guidance of needle.   Number of Attempts: 1 Complications:none  Estimated Blood Loss: none Operator: Charise Leinbach.   Lucie Leather, M.D.  [facility]  Medical Director [facility] Medical Director [facility] Cardio-Pulmonary Department

## 2021-02-13 NOTE — Consult Note (Signed)
[facility]  CONSULT NOTE    Date: 02/13/2021                  Patient Name:  Michael Lester  MRN: [id number]  DOB: April 13, 1986  Age / Sex: 35 y.o., male         PCP: Patient, [name] (Inactive)                 Service Requesting Consult: Dr. Mortimer Fries                 Reason for Consult: Acute kidney injury            History of Present Illness: Michael Lester admitted to [facility] on 02/20/2021 for Shortness of breath [R06.02] Overdose [T50.901A] Transaminitis [R74.01] Acute kidney injury [facility]) [N17.9]   Patient was doing illicit substances including methanphetamines. Patient was confused and combative. He was then intubated for airway protection. Found to have hyperkalemia, hyponatremia, anion gap acidosis, and acute kidney injury. Nephrology consulted. UOP 50.   Worsening kidney function this morning and acute liver injury.   Father is available on the phone. Father is actually admitted to 44 for metastatic prostate cancer.    Medications: Outpatient medications: Medications Prior to Admission  Medication Sig Dispense Refill Last Dose  . benzonatate (TESSALON) 100 MG capsule Take 1 capsule (100 mg total) by mouth every 8 (eight) hours. (Patient not taking: Reported on 02/16/2021) 21 capsule 0 Not Taking at Unknown time  . folic acid (FOLVITE) 1 MG tablet Take 1 tablet (1 mg total) by mouth daily. (Patient not taking: Reported on 02/16/2021) 90 tablet 0 Not Taking at Unknown time  . gabapentin (NEURONTIN) 100 MG capsule Take 1 capsule (100 mg total) by mouth 3 (three) times daily. (Patient not taking: No sig reported) 90 capsule 0 Not Taking at Unknown time  . ibuprofen (ADVIL) 600 MG tablet Take 1 tablet (600 mg total) by mouth every 6 (six) hours as needed. (Patient not taking: Reported on 02/25/2021) 30 tablet 0 Not Taking at Unknown time  . nicotine (NICODERM CQ - DOSED IN MG/24 HOURS) 14 mg/24hr patch Place 1 patch (14 mg total) onto the skin daily.  (Patient not taking: No sig reported) 28 patch 0 Not Taking at Unknown time  . thiamine 100 MG tablet Take 1 tablet (100 mg total) by mouth daily. (Patient not taking: Reported on 02/18/2021) 90 tablet 0 Not Taking at Unknown time  . vitamin B-12 (CYANOCOBALAMIN) 1000 MCG tablet Take 1 tablet (1,000 mcg total) by mouth daily. (Patient not taking: Reported on 02/09/2021) 90 tablet 0 Not Taking at Unknown time    Current medications: Current Facility-Administered Medications  Medication Dose Route Frequency Provider Last Rate Last Admin  . 0.9 %  sodium chloride infusion   Intravenous Continuous Rust-Chester, Britton L, NP 100 mL/hr at 02/13/21 0355 Rate Change at 02/13/21 0355  . cefTRIAXone (ROCEPHIN) 2 g in sodium chloride 0.9 % 100 mL IVPB  2 g Intravenous Q24H Flora Lipps, MD      . chlorhexidine gluconate (MEDLINE KIT) (PERIDEX) 0.12 % solution 15 mL  15 mL Mouth Rinse BID Rust-Chester, Britton L, NP   15 mL at 02/13/21 0819  . Chlorhexidine Gluconate Cloth 2 % PADS 6 each  6 each Topical Q0600 Rust-Chester, Huel Cote, NP   6 each at 02/13/21 0000  . Chlorhexidine Gluconate Cloth 2 % PADS 6 each  6 each Topical Daily Rust-Chester, Huel Cote, NP  6 each at 02/13/21 1106  . dextrose 5 % solution   Intravenous Once Naaman Plummer, MD      . docusate (COLACE) 50 MG/5ML liquid 100 mg  100 mg Per Tube BID Rust-Chester, Britton L, NP   100 mg at 02/13/21 1100  . docusate sodium (COLACE) capsule 100 mg  100 mg Oral BID PRN Rust-Chester, Britton L, NP      . fentaNYL (SUBLIMAZE) bolus via infusion 50 mcg  50 mcg Intravenous Q1H PRN Naaman Plummer, MD      . fentaNYL (SUBLIMAZE) injection 50 mcg  50 mcg Intravenous Once Naaman Plummer, MD      . fentaNYL 2533mg in NS 2532m(1040mml) infusion-PREMIX  25-400 mcg/hr Intravenous Continuous BraNaaman PlummerD 10 mL/hr at 02/13/21 0700 100 mcg/hr at 02/13/21 0700  . folic acid (FOLVITE) tablet 1 mg  1 mg Per Tube Daily Rust-Chester, Britton L, NP   1 mg  at 02/13/21 1120  . heparin injection 5,000 Units  5,000 Units Subcutaneous Q8H Rust-Chester, Britton L, NP   5,000 Units at 02/13/21 [id number] LORazepam (ATIVAN) tablet 1-4 mg  1-4 mg Oral Q1H PRN Rust-Chester, BriHuel CoteP       Or  . LORazepam (ATIVAN) injection 1-4 mg  1-4 mg Intravenous Q1H PRN Rust-Chester, Britton L, NP      . MEDLINE mouth rinse  15 mL Mouth Rinse 10 times per day Rust-Chester, Britton L, NP   15 mL at 02/13/21 1100  . midazolam (VERSED) injection 2 mg  2 mg Intravenous Q15 min PRN BraNaaman PlummerD   2 mg at 02/13/21 0659  . midazolam (VERSED) injection 2 mg  2 mg Intravenous Q2H PRN BraNaaman PlummerD   2 mg at 02/13/21 0333  . multivitamin with minerals tablet 1 tablet  1 tablet Per Tube Daily Rust-Chester, Britton L, NP   1 tablet at 02/13/21 1120  . mupirocin ointment (BACTROBAN) 2 % 1 application  1 application Nasal BID Rust-Chester, Britton L, NP      . pantoprazole (PROTONIX) injection 40 mg  40 mg Intravenous QHS Rust-Chester, Britton L, NP   40 mg at 02/13/21 0152  . polyethylene glycol (MIRALAX / GLYCOLAX) packet 17 g  17 g Oral Daily PRN Rust-Chester, Britton L, NP      . polyethylene glycol (MIRALAX / GLYCOLAX) packet 17 g  17 g Per Tube Daily Rust-Chester, Britton L, NP   17 g at 02/13/21 1120  . propofol (DIPRIVAN) 1000 MG/100ML infusion  0-50 mcg/kg/min Intravenous Continuous Rust-Chester, Britton L, NP 4.5 mL/hr at 02/13/21 0702 10 mcg/kg/min at 02/13/21 0702  . sodium bicarbonate 150 mEq in sterile water 1,150 mL infusion   Intravenous Continuous Rust-Chester, Britton L, NP 125 mL/hr at 02/13/21 0230 Rate Change at 02/13/21 0230  . sodium zirconium cyclosilicate (LOKELMA) packet 10 g  10 g Per Tube Q6H Rust-Chester, Britton L, NP   10 g at 02/13/21 0900  . thiamine tablet 100 mg  100 mg Per Tube Daily Rust-Chester, Britton L, NP   100 mg at 02/13/21 1119   Or  . thiamine (B-1) injection 100 mg  100 mg Intravenous Daily Rust-Chester, BriHuel CoteP           Allergies: Allergies  Allergen Reactions  . Shellfish Allergy Anaphylaxis      Past Medical History: History reviewed. No pertinent past medical history.   Past Surgical History: Past Surgical History:  Procedure Laterality  Date  . ANTERIOR CERVICAL DECOMP/DISCECTOMY FUSION N/A 12/22/2019   Procedure: cervical five-six, cervical six-seven anterior cervical discectomy with interbody fusion;  Surgeon: Earnie Larsson, MD;  Location: [facility];  Service: Neurosurgery;  Laterality: N/A;  . LACERATION REPAIR N/A 11/16/2014   Procedure: REPAIR MULTIPLE LACERATIONS;  Surgeon: Ruby Cola, MD;  Location: [place];  Service: ENT;  Laterality: N/A;  . LACERATION REPAIR Left 11/16/2014   Procedure: REPAIR MULTIPLE LACERATIONS;  Surgeon: Donnie Mesa, MD;  Location: [place];  Service: General;  Laterality: Left;  irrigation and suturing of lacerations on upper body     Family History: History reviewed. No pertinent family history.   Social History: Social History   Socioeconomic History  . Marital status: Divorced    Spouse name: Not on file  . Number of children: Not on file  . Years of education: Not on file  . Highest education level: Not on file  Occupational History  . Not on file  Tobacco Use  . Smoking status: Current Every Day Smoker    Packs/day: 0.50  . Smokeless tobacco: Never Used  Vaping Use  . Vaping Use: Never used  Substance and Sexual Activity  . Alcohol use: Not Currently  . Drug use: Yes    Types: Marijuana, Heroin, Cocaine    Comment: heroin and fentanyl  . Sexual activity: Not on file  Other Topics Concern  . Not on file  Social History Narrative  . Not on file   Social Determinants of Health   Financial Resource Strain: Not on file  Food Insecurity: Not on file  Transportation Needs: Not on file  Physical Activity: Not on file  Stress: Not on file  Social Connections: Not on file  Intimate Partner Violence: Not on file     Review of  Systems: Review of Systems  Unable to perform ROS: Critical illness    Vital Signs: Blood pressure (!) 95/47, pulse (!) 117, temperature 100.04 F (37.8 C), resp. rate (!) 23, height _0  (1.88 m), weight 75 kg, SpO2 90 %.  Weight trends: Filed Weights   01/31/2021 1607 02/13/21 0354  Weight: 75 kg 75 kg    Physical Exam: General: Critically ill  Head: ETT   Eyes: Anicteric, PERRL  Neck: trachea midline  Lungs:  PRVC FiO2 45%  Heart: tachycardia  Abdomen:  Soft   Extremities:  no peripheral edema.  Neurologic: Intubated and sedated  Skin: No lesions  Access: none     Lab results: Basic Metabolic Panel: Recent Labs  Lab 02/19/2021 1935 02/13/21 0118 02/13/21 0527  NA 126* 128* 127*  K 6.5* 6.6* 6.2*  CL 91* 91* 90*  CO2 10* 16* 15*  GLUCOSE 130* 157* 179*  BUN 88* 98* 103*  CREATININE 7.02* 7.54* 8.21*  CALCIUM 5.2* 5.5* 5.5*  MG 2.4  --  2.6*  PHOS 14.3*  --  11.7*    Liver Function Tests: Recent Labs  Lab 02/25/2021 1754 02/19/2021 1935 02/13/21 0527  AST 1,385* 1,379* 1,328*  ALT 253* 274* 307*  ALKPHOS 152* 140* 130*  BILITOT 1.5* 1.3* 1.2  PROT 7.6 6.7 6.2*  ALBUMIN 4.1 3.8 3.3*   Recent Labs  Lab 02/14/2021 1754  LIPASE 26   No results for input(s): AMMONIA in the last 168 hours.  CBC: Recent Labs  Lab 02/21/2021 1754 02/13/21 0527  WBC 14.3* 8.5  NEUTROABS 11.4*  --   HGB 12.5* 11.7*  HCT 37.2* 33.9*  MCV 79.0* 77.6*  PLT 279 200  Cardiac Enzymes: No results for input(s): CKTOTAL, CKMB, CKMBINDEX, TROPONINI in the last 168 hours.  BNP: Invalid input(s): POCBNP  CBG: Recent Labs  Lab 02/08/2021 1836 02/17/2021 1933 02/13/21 0349 02/13/21 0814 02/13/21 1125  GLUCAP 35* 134* 200* 157* 109*    Microbiology: Results for orders placed or performed during the hospital encounter of 02/22/2021  CULTURE, BLOOD (ROUTINE X 2) w Reflex to ID Panel     Status: None (Preliminary result)   Collection Time: 02/09/2021  7:48 PM   Specimen:  BLOOD  Result Value Ref Range Status   Specimen Description BLOOD LEFT HAND  Final   Special Requests   Final    BOTTLES DRAWN AEROBIC AND ANAEROBIC Blood Culture adequate volume   Culture   Final    NO GROWTH < 12 HOURS Performed at [facility], [address] [place], [place] [address]    Report Status PENDING  Incomplete  CULTURE, BLOOD (ROUTINE X 2) w Reflex to ID Panel     Status: None (Preliminary result)   Collection Time: 02/07/2021  7:48 PM   Specimen: BLOOD  Result Value Ref Range Status   Specimen Description BLOOD LEFT HAND  Final   Special Requests   Final    BOTTLES DRAWN AEROBIC AND ANAEROBIC Blood Culture adequate volume   Culture  Setup Time   Final    Organism ID to follow GRAM POSITIVE COCCI ANAEROBIC BOTTLE ONLY CRITICAL RESULT CALLED TO, READ BACK BY AND VERIFIED WITH: [facility] SLAUGHTER AT 0800 02/13/21 [place] Performed at [facility], [address] [place],  [address]    Culture GRAM POSITIVE COCCI  Final   Report Status PENDING  Incomplete  Blood Culture ID Panel (Reflexed)     Status: Abnormal   Collection Time: 02/09/2021  7:48 PM  Result Value Ref Range Status   Enterococcus faecalis NOT DETECTED NOT DETECTED Final   Enterococcus Faecium NOT DETECTED NOT DETECTED Final   Listeria monocytogenes NOT DETECTED NOT DETECTED Final   Staphylococcus species NOT DETECTED NOT DETECTED Final   Staphylococcus aureus (BCID) NOT DETECTED NOT DETECTED Final   Staphylococcus epidermidis NOT DETECTED NOT DETECTED Final   Staphylococcus lugdunensis NOT DETECTED NOT DETECTED Final   Streptococcus species DETECTED (A) NOT DETECTED Final    Comment: Not Enterococcus species, Streptococcus agalactiae, Streptococcus pyogenes, or Streptococcus pneumoniae. CRITICAL RESULT CALLED TO, READ BACK BY AND VERIFIED WITH:  MYRA SLAUGHTER AT 0800 02/13/21 SDR    Streptococcus agalactiae NOT DETECTED NOT DETECTED Final   Streptococcus pneumoniae NOT DETECTED NOT  DETECTED Final   Streptococcus pyogenes NOT DETECTED NOT DETECTED Final   A.calcoaceticus-baumannii NOT DETECTED NOT DETECTED Final   Bacteroides fragilis NOT DETECTED NOT DETECTED Final   Enterobacterales NOT DETECTED NOT DETECTED Final   Enterobacter cloacae complex NOT DETECTED NOT DETECTED Final   Escherichia coli NOT DETECTED NOT DETECTED Final   Klebsiella aerogenes NOT DETECTED NOT DETECTED Final   Klebsiella oxytoca NOT DETECTED NOT DETECTED Final   Klebsiella pneumoniae NOT DETECTED NOT DETECTED Final   Proteus species NOT DETECTED NOT DETECTED Final   Salmonella species NOT DETECTED NOT DETECTED Final   Serratia marcescens NOT DETECTED NOT DETECTED Final   Haemophilus influenzae NOT DETECTED NOT DETECTED Final   Neisseria meningitidis NOT DETECTED NOT DETECTED Final   Pseudomonas aeruginosa NOT DETECTED NOT DETECTED Final   Stenotrophomonas maltophilia NOT DETECTED NOT DETECTED Final   Candida albicans NOT DETECTED NOT DETECTED Final   Candida auris NOT DETECTED NOT DETECTED  Final   Candida glabrata NOT DETECTED NOT DETECTED Final   Candida krusei NOT DETECTED NOT DETECTED Final   Candida parapsilosis NOT DETECTED NOT DETECTED Final   Candida tropicalis NOT DETECTED NOT DETECTED Final   Cryptococcus neoformans/gattii NOT DETECTED NOT DETECTED Final    Comment: Performed at [facility], [address] [place], [place] [address]  Resp Panel by RT-PCR (Flu A&B, Covid)     Status: None   Collection Time: 02/16/2021  7:57 PM  Result Value Ref Range Status   SARS Coronavirus 2 by RT PCR NEGATIVE NEGATIVE Final    Comment: (NOTE) SARS-CoV-2 target nucleic acids are NOT DETECTED.  The SARS-CoV-2 RNA is generally detectable in upper respiratory specimens during the acute phase of infection. The lowest concentration of SARS-CoV-2 viral copies this assay can detect is 138 copies/mL. A negative result does not preclude SARS-Cov-2 infection and should not be used as the  sole basis for treatment or other patient management decisions. A negative result may occur with  improper specimen collection/handling, submission of specimen other than nasopharyngeal swab, presence of viral mutation(s) within the areas targeted by this assay, and inadequate number of viral copies(<138 copies/mL). A negative result must be combined with clinical observations, patient history, and epidemiological information. The expected result is Negative.  Fact Sheet for Patients:  EntrepreneurPulse.com.au  Fact Sheet for Healthcare Providers:  IncredibleEmployment.be  This test is no t yet approved or cleared by the [place] FDA and  has been authorized for detection and/or diagnosis of SARS-CoV-2 by FDA under an Emergency Use Authorization (EUA). This EUA will remain  in effect (meaning this test can be used) for the duration of the COVID-19 declaration under Section 564(b)(1) of the Act, 21 U.S.C.section 360bbb-3(b)(1), unless the authorization is terminated  or revoked sooner.       Influenza A by PCR NEGATIVE NEGATIVE Final   Influenza B by PCR NEGATIVE NEGATIVE Final    Comment: (NOTE) The Xpert Xpress SARS-CoV-2/FLU/RSV plus assay is intended as an aid in the diagnosis of influenza from Nasopharyngeal swab specimens and should not be used as a sole basis for treatment. Nasal washings and aspirates are unacceptable for Xpert Xpress SARS-CoV-2/FLU/RSV testing.  Fact Sheet for Patients: EntrepreneurPulse.com.au  Fact Sheet for Healthcare Providers: IncredibleEmployment.be  This test is not yet approved or cleared by the [place] FDA and has been authorized for detection and/or diagnosis of SARS-CoV-2 by FDA under an Emergency Use Authorization (EUA). This EUA will remain in effect (meaning this test can be used) for the duration of the COVID-19 declaration under Section 564(b)(1) of the  Act, 21 U.S.C. section 360bbb-3(b)(1), unless the authorization is terminated or revoked.  Performed at [facility], [address] [place], [place] [address]   MRSA PCR Screening     Status: Abnormal   Collection Time: 02/13/21  1:00 AM   Specimen: Nasopharyngeal  Result Value Ref Range Status   MRSA by PCR POSITIVE (A) NEGATIVE Final    Comment:        The GeneXpert MRSA Assay (FDA approved for NASAL specimens only), is one component of a comprehensive MRSA colonization surveillance program. It is not intended to diagnose MRSA infection nor to guide or monitor treatment for MRSA infections. RESULT CALLED TO, READ BACK BY AND VERIFIED WITH: NOTIFIED Donnamae Jude RN 02/13/2021 0209 [place] Performed at [facility], [address] [place], [place] [address]     Coagulation Studies: No results for input(s): LABPROT, INR  in the last 72 hours.  Urinalysis: Recent Labs    02/01/2021 2235  COLORURINE AMBER*  LABSPEC 1.018  PHURINE 5.0  GLUCOSEU 50*  HGBUR LARGE*  BILIRUBINUR NEGATIVE  KETONESUR 5*  PROTEINUR >=300*  NITRITE NEGATIVE  LEUKOCYTESUR NEGATIVE      Imaging: DG Abd 1 View  Result Date: 02/13/2021 CLINICAL DATA:  OG tube placement EXAM: ABDOMEN - 1 VIEW COMPARISON:  None. FINDINGS: OG tube tip is in the stomach. Nonobstructive bowel gas pattern. Lung bases clear. IMPRESSION: OG tube tip in the stomach. Electronically Signed   By: Rolm Baptise M.D.   On: 02/13/2021 01:59   CT HEAD WO CONTRAST  Result Date: 02/13/2021 CLINICAL DATA:  Altered mental status EXAM: CT HEAD WITHOUT CONTRAST TECHNIQUE: Contiguous axial images were obtained from the base of the skull through the vertex without intravenous contrast. COMPARISON:  09/16/2020 FINDINGS: Brain: No acute intracranial abnormality. Specifically, no hemorrhage, hydrocephalus, mass lesion, acute infarction, or significant intracranial injury. Vascular: No hyperdense vessel or unexpected  calcification. Skull: No acute calvarial abnormality. Sinuses/Orbits: No acute findings Other: Soft tissue swelling over the right orbit and forehead. IMPRESSION: No acute intracranial abnormality. Electronically Signed   By: Rolm Baptise M.D.   On: 02/13/2021 00:32   US RENAL  Result Date: 02/13/2021 CLINICAL DATA:  AKI EXAM: RENAL / URINARY TRACT ULTRASOUND COMPLETE COMPARISON:  None. FINDINGS: Right Kidney: Renal measurements: 9.2 x 4.7 x 5 cm = volume: 112.1 mL. Echogenicity within normal limits. No mass or hydronephrosis visualized. Left Kidney: Renal measurements: 9.9 x 4.7 x 4.3 cm = volume: 105.8 mL. Echogenicity within normal limits. No mass or hydronephrosis visualized. Bladder: Decompressed by Foley. Other: None. IMPRESSION: No hydronephrosis. Electronically Signed   By: Macy Mis M.D.   On: 02/13/2021 09:05   DG Chest Port 1 View  Result Date: 02/13/2021 CLINICAL DATA:  Central line placement EXAM: PORTABLE CHEST 1 VIEW COMPARISON:  02/11/2021 FINDINGS: Endotracheal tube is 7.6 cm above the carina. Left central line is been placed with the tip in the SVC. NG tube remains in the stomach. No pneumothorax. Heart is normal size. Lungs clear. No effusions. IMPRESSION: Left central line tip in the SVC.  No pneumothorax. No acute cardiopulmonary disease. Electronically Signed   By: Rolm Baptise M.D.   On: 02/13/2021 01:58   DG Chest Port 1 View  Result Date: 02/06/2021 CLINICAL DATA:  Drug overdose, respiratory failure EXAM: PORTABLE CHEST 1 VIEW COMPARISON:  11/07/2020 FINDINGS: Endotracheal tube is seen 5.0 cm above the carina. Nasogastric tube extends into the upper abdomen. Lungs are clear. No pneumothorax or pleural effusion. Cervical fusion hardware noted. No acute bone abnormality. IMPRESSION: Support tubes in appropriate position. No focal pulmonary infiltrate. Electronically Signed   By: Fidela Salisbury MD   On: 02/04/2021 21:56   US Abdomen Limited RUQ (LIVER/GB)  Result Date:  02/13/2021 CLINICAL DATA:  Transaminitis EXAM: ULTRASOUND ABDOMEN LIMITED RIGHT UPPER QUADRANT COMPARISON:  None. FINDINGS: Gallbladder: No gallstones or wall thickening visualized. No sonographic Murphy sign noted by sonographer. Common bile duct: Diameter: 5 mm, normal Liver: No focal lesion identified. Within normal limits in parenchymal echogenicity. Portal vein is patent on color Doppler imaging with normal direction of blood flow towards the liver. Other: None. IMPRESSION: No significant abnormality. Electronically Signed   By: Macy Mis M.D.   On: 02/13/2021 09:01      Assessment & Plan: Michael Lester is a 35 y.o. whtie male with substance abuse, who was admitted  to [facility] on 02/05/2021 for Shortness of breath [R06.02] Overdose [T50.901A] Transaminitis [R74.01] Acute kidney injury ([place]) [N17.9]  1. Acute kidney injury: baseline creatinine of 1.5, GFR > 60 on 02/01/21.  2. Hyperkalemia 3. Metabolic acidosis 4. Hyponatremia 5. Proteinuria 6. Hypocalcemia  Patient is critically ill. Patient will need emergent hemodialysis. ICU consulted for catheter placement and will initiate dialysis this afternoon.   LOS: 1 Miranda Garber 4/18/202211:27 AM

## 2021-02-13 NOTE — Consult Note (Addendum)
PHARMACY CONSULT NOTE - FOLLOW UP  Pharmacy Consult for Electrolyte Monitoring and Replacement   Recent Labs: Potassium (mmol/L)  Date Value  02/13/2021 6.2 (H)   Magnesium (mg/dL)  Date Value  70/10/7492 2.6 (H)   Calcium (mg/dL)  Date Value  49/67/5916 5.5 (LL)   Albumin (g/dL)  Date Value  38/46/6599 3.3 (L)   Phosphorus (mg/dL)  Date Value  35/70/1779 11.7 (H)   Sodium (mmol/L)  Date Value  02/13/2021 127 (L)   Corr Ca: 6.06 mg/dL  Assessment: 35 year old male with known past medical history of polysubstance abuse including IV drug abuse, presenting to [facility] ED via EMS after unknown illicit substance ingestion.  Vital 1.5 @30ml /hr Free water 39ml q4h Nephrology following - pt to have catheter placed for HD  Goal of Therapy:  Electrolytes WNL  Plan:   Corr Ca 6.06 - pt received 2g IV Ca gluconate  Na 127 - continue 0.9% NS @100ml /hr and Na bicarb 31m in SW @125ml /hr  K 6.6>6.2 - continue lokelma 10g q6h x6 doses (colace BID and miralax QD)  Phos 11.7 - pt to have catheter placed for dialysis  Re-check electrolytes with AM labs and replace as needed   , PharmD Pharmacy Resident  02/13/2021 7:25 AM

## 2021-02-13 NOTE — Progress Notes (Signed)
eLink Physician-Brief Progress Note Patient Name: Michael Lester DOB: May 22, 1986 MRN: [id number]   Date of Service  02/13/2021  HPI/Events of Note  Patient admitted with altered mental status, acute respiratory failure, and multiple organ systems dysfunction due to suspected illicit drug intoxication. Organ dysfunction includes acute liver and kidney injury, r/o NSTEMI and electrolyte abnormalities.  eICU Interventions  New  Patient Evaluation completed.        Thomasene Lot Sussie Minor 02/13/2021, 1:47 AM

## 2021-02-13 NOTE — Progress Notes (Signed)
NAME:  Michael Lester, MRN:  [id number], DOB:  06/21/1986, LOS: 1 ADMISSION DATE:  02/23/2021   History of Present Illness:  35 year old male with known past medical history of polysubstance abuse including IV drug abuse, presenting to [facility] ED via EMS after unknown illicit substance ingestion.  Per ED documentation EMS reported the patient had been tachycardic and hypertensive throughout transport.  Patient's mental status has been such that no further history collected, suspected methamphetamine and fentanyl overdose. ED course: Per ED documentation patient has uncontrollable movements, unable to be redirected.  While in the ED the patient throwing himself on the floor, was banging head on boards of the bed and thrashing around.  He received 4 mg of Ativan IM, bed was removed and multiple formats placed for patient safety. Initial vitals: Febrile 100.8, tachypneic at 30, tachycardic at 111, hypotensive at 74/40 with SPO2 95% on room air.  Significant labs: Hyponatremic at 127, severely hyperkalemic at 7.4, hypochloremic at 87, high AG metabolic acidosis with serum CO2 at 12 and AG 28, acute renal failure with BUN/Cr 89/7.49, hypocalcemia at 5.8, Transaminitis: Alk phos 152, AST 1385, ALT 253, total bilirubin 1.5, BNP elevated at 125.9, troponin elevated at 321, leukocytosis at 14.3, osmolality gap Of 16 with serum osmolality at 304, serum alcohol less than 10, hypoglycemia 36.  VBG showing metabolic acidosis: 6.62/94/76/54.6. The patient continued to be altered, nonredirectable due to acute encephalopathy the decision was made for discussing the case with Dr. Cheri Fowler to emergently intubate the patient and placed on mechanical ventilation.  Dr. Juleen China was emergently consulted and is coming to assess the patient bedside.  PCCM consulted for admission. Pertinent  Medical History  Polysubstance abuse including IV drugs, ETOH & Marijuana Seizures cervical osteomyelitis s/p surgical intervention  2021 Chronic Hep C Accidental drug overdose ( most recent 02/01/21) CAD Prior N-STEMI  Significant Hospital Events: Including procedures, antibiotic start and stop dates in addition to other pertinent events    02/17/2021- Admit to ICU with suspected fentanyl/methamphetamine OD and acute renal failure    Interim History / Subjective:  Remains on vent Critically ill multiorgan failure     Micro Data:  COVID NEG MRSA +   Antimicrobials:   Antibiotics Given (last 72 hours)    None          Objective   Blood pressure (!) 95/47, pulse (!) 117, temperature 100.04 F (37.8 C), resp. rate (!) 23, height 6' 2"  (1.88 m), weight 75 kg, SpO2 90 %.    Vent Mode: PRVC FiO2 (%):  [25 %-45 %] 45 % Set Rate:  [2 bmp-20 bmp] 20 bmp Vt Set:  [500 mL] 500 mL PEEP:  [5 cmH20] 5 cmH20 Plateau Pressure:  [22 cmH20] 22 cmH20   Intake/Output Summary (Last 24 hours) at 02/13/2021 1057 Last data filed at 02/13/2021 0600 Gross per 24 hour  Intake 2200.98 ml  Output 50 ml  Net 2150.98 ml   Filed Weights   02/21/2021 1607 02/13/21 0354  Weight: 75 kg 75 kg      REVIEW OF SYSTEMS  PATIENT IS UNABLE TO PROVIDE COMPLETE REVIEW OF SYSTEMS DUE TO SEVERE CRITICAL ILLNESS AND TOXIC METABOLIC ENCEPHALOPATHY   PHYSICAL EXAMINATION:  GENERAL:critically ill appearing, +resp distress HEAD: Normocephalic, atraumatic.  EYES: Pupils equal, round, reactive to light.  No scleral icterus.  MOUTH: Moist mucosal membrane. NECK: Supple. PULMONARY: +rhonchi, +wheezing CARDIOVASCULAR: S1 and S2. Regular rate and rhythm. No murmurs, rubs, or gallops.  GASTROINTESTINAL: Soft, nontender, -distended. Positive  bowel sounds.  MUSCULOSKELETAL: No swelling, clubbing, or edema.  NEUROLOGIC: obtunded SKIN:intact,warm,dry   Labs/imaging that I havepersonally reviewed  (right click and "Reselect all SmartList Selections" daily)      ASSESSMENT AND PLAN SYNOPSIS   ADMITTED FOR Multiorgan failure  failure from polysubstance abuse and OD  RESP, LIVER, RENAL, SEVERE ACIDOSIS  DISTRIBUTIVE SHOCK FROM DRUG ABUSE    Severe ACUTE Hypoxic and Hypercapnic Respiratory Failure -continue Mechanical Ventilator support -continue Bronchodilator Therapy -Wean Fio2 and PEEP as tolerated -VAP/VENT bundle implementation -will perform SAT/SBT when respiratory parameters are met   CARDIAC FAILURE-acute combined systolic/diastolic dysfunction Check ECHO -follow up cardiac enzymes as indicated   CARDIAC ICU monitoring   ACUTE KIDNEY INJURY/Renal Failure -continue Foley Catheter-assess need -Avoid nephrotoxic agents -Follow urine output, BMP -Ensure adequate renal perfusion, optimize oxygenation -Renal dose medications vasc cath to be placed and to start HD   NEUROLOGY Acute toxic metabolic encephalopathy, need for sedation Goal RASS -2 to -3   ENDO - ICU hypoglycemic\Hyperglycemia protocol -check FSBS per protocol   GI GI PROPHYLAXIS as indicated  NUTRITIONAL STATUS DIET-->TF's as tolerated Constipation protocol as indicated   ELECTROLYTES -follow labs as needed -replace as needed -pharmacy consultation and following    Best practice (right click and "Reselect all SmartList Selections" daily)  Diet:  NPO Pain/Anxiety/Delirium protocol (if indicated): Yes (RASS goal -2) VAP protocol (if indicated): Yes DVT prophylaxis: Subcutaneous Heparin GI prophylaxis: PPI Glucose control:  SSI No Central venous access:  N/A Arterial line:  N/A Foley:  Yes, and it is still needed Mobility:  bed rest  PT consulted: N/A Last date of multidisciplinary goals of care discussion 02/11/2021- pt unable to participate in care, called NOK in epic- father Code Status:  full code Disposition: ICU  Labs   CBC: Recent Labs  Lab 02/18/2021 1754 02/13/21 0527  WBC 14.3* 8.5  NEUTROABS 11.4*  --   HGB 12.5* 11.7*  HCT 37.2* 33.9*  MCV 79.0* 77.6*  PLT 279 482    Basic Metabolic  Panel: Recent Labs  Lab 02/17/2021 1754 01/30/2021 1935 02/13/21 0118 02/13/21 0527  NA 127* 126* 128* 127*  K 7.4* 6.5* 6.6* 6.2*  CL 87* 91* 91* 90*  CO2 12* 10* 16* 15*  GLUCOSE 36* 130* 157* 179*  BUN 89* 88* 98* 103*  CREATININE 7.49* 7.02* 7.54* 8.21*  CALCIUM 5.8* 5.2* 5.5* 5.5*  MG  --  2.4  --  2.6*  PHOS  --  14.3*  --  11.7*   GFR: Estimated Creatinine Clearance: 13.1 mL/min (A) (by C-G formula based on SCr of 8.21 mg/dL (H)). Recent Labs  Lab 02/25/2021 1754 02/13/21 0118 02/13/21 0257 02/13/21 0527  WBC 14.3*  --   --  8.5  LATICACIDVEN  --  1.4 1.6  --     Liver Function Tests: Recent Labs  Lab 02/11/2021 1754 02/07/2021 1935 02/13/21 0527  AST 1,385* 1,379* 1,328*  ALT 253* 274* 307*  ALKPHOS 152* 140* 130*  BILITOT 1.5* 1.3* 1.2  PROT 7.6 6.7 6.2*  ALBUMIN 4.1 3.8 3.3*   Recent Labs  Lab 02/10/2021 1754  LIPASE 26   No results for input(s): AMMONIA in the last 168 hours.  ABG    Component Value Date/Time   PHART 7.27 (L) 02/13/2021 0548   PCO2ART 28 (L) 02/13/2021 0548   PO2ART 93 02/13/2021 0548   HCO3 12.9 (L) 02/13/2021 0548   ACIDBASEDEF 12.6 (H) 02/13/2021 0548   O2SAT 96.0 02/13/2021 0548  Coagulation Profile: No results for input(s): INR, PROTIME in the last 168 hours.  Cardiac Enzymes: No results for input(s): CKTOTAL, CKMB, CKMBINDEX, TROPONINI in the last 168 hours.  HbA1C: Hgb A1c MFr Bld  Date/Time Value Ref Range Status  05/02/2020 01:11 AM 5.5 4.8 - 5.6 % Final    Comment:    (NOTE) Pre diabetes:          5.7%-6.4%  Diabetes:              >6.4%  Glycemic control for   <7.0% adults with diabetes     CBG: Recent Labs  Lab 01/27/2021 1836 02/14/2021 1933 02/13/21 0349 02/13/21 0814  GLUCAP 35* 134* 200* 157*    Allergies Allergies  Allergen Reactions  . Shellfish Allergy Anaphylaxis       DVT/GI PRX  assessed I Assessed the need for Labs I Assessed the need for Foley I Assessed the need for Central  Venous Line Family Discussion when available I Assessed the need for Mobilization I made an Assessment of medications to be adjusted accordingly Safety Risk assessment completed  CASE DISCUSSED IN MULTIDISCIPLINARY ROUNDS WITH ICU TEAM     Critical Care Time devoted to patient care services described in this note is 65 minutes.   Overall, patient is critically ill, prognosis is guarded.  Patient with Multiorgan failure and at high risk for cardiac arrest and death.    Corrin Parker, M.D.  [facility]  Medical Director [facility] Director [facility] Cardio-Pulmonary Department

## 2021-02-13 NOTE — TOC Initial Note (Addendum)
Transition of Care [facility]) - Initial/Assessment Note    Patient Details  Name: Michael Lester MRN: [id number] Date of Birth: 05-07-1986  Transition of Care [facility]) CM/SW Contact:     Cellar, RN Phone Number: 02/13/2021, 12:03 PM  Clinical Narrative:                 Unable to assess patient and father is currently patient in room 245. No other contacts at this time. Father is aware patient is in hospital.    Father, Michael Lester has wife at bedside-Billie who confirmed she is not at all involved with her stepsons care. All questions/concerns are to be directed to father, Michael Lester.      Patient Goals and CMS Choice        Expected Discharge Plan and Services                                                Prior Living Arrangements/Services                       Activities of Daily Living      Permission Sought/Granted                  Emotional Assessment              Admission diagnosis:  Shortness of breath [R06.02] Overdose [T50.901A] Transaminitis [R74.01] Acute kidney injury (HCC) [N17.9] Patient Active Problem List   Diagnosis Date Noted  . Overdose 22-Feb-2021  . Abrasion of right knee   . AKI (acute kidney injury) (HCC)   . Polysubstance abuse (HCC)   . AMS (altered mental status) 09/16/2020  . Foreign body (FB) in soft tissue   . IVDU (intravenous drug user) 05/02/2020  . Laceration of foot 05/02/2020  . Elevated liver enzymes 05/02/2020  . Elevated troponin 05/02/2020  . Accidental drug overdose   . Non-traumatic rhabdomyolysis   . NSTEMI (non-ST elevated myocardial infarction) (HCC)   . Neck pain 05/01/2020  . Hepatitis C antibody positive in blood 01/21/2020  . Severe sepsis (HCC) 12/30/2019  . Abscess in epidural space of cervical spine 12/21/2019  . Opioid dependence, daily use (HCC) 12/21/2019  . Chronic hepatitis C without hepatic coma (HCC) 12/21/2019  . Osteomyelitis of C6-7 12/20/2019  . Injection of illicit drug  within last 12 months 12/20/2019  . Syncope   . Multiple lacerations 11/17/2014   PCP:  Patient, [name] (Inactive) Pharmacy:   [id number] [place], [place] - [address] [place] [address] Phone: [phone] Fax: [phone]  [facility] [address] [place] [address] Phone: [phone] Fax: [phone]     Social Determinants of Health (SDOH) Interventions    Readmission Risk Interventions Readmission Risk Prevention Plan 09/16/2020  Transportation Screening Complete  Medication Review [facility]) Complete  PCP or Specialist appointment within 3-5 days of discharge Complete  HRI or Home Care Consult Complete  SW Recovery Care/Counseling Consult Complete  Palliative Care Screening Not Applicable  Skilled Nursing Facility Not Applicable  Some recent data might be hidden

## 2021-02-14 ENCOUNTER — Inpatient Hospital Stay: Payer: Self-pay

## 2021-02-14 ENCOUNTER — Inpatient Hospital Stay
Admit: 2021-02-14 | Discharge: 2021-02-14 | Disposition: A | Discharge status: Home / Self Care | Payer: Self-pay | Attending: Pulmonary Disease | Admitting: Pulmonary Disease

## 2021-02-14 DIAGNOSIS — E872 Acidosis: Secondary | ICD-10-CM

## 2021-02-14 DIAGNOSIS — Z7189 Other specified counseling: Secondary | ICD-10-CM

## 2021-02-14 LAB — BLOOD GAS, ARTERIAL
Acid-base deficit: 12.6 mmol/L — ABNORMAL HIGH (ref 0.0–2.0)
Acid-base deficit: 13.7 mmol/L — ABNORMAL HIGH (ref 0.0–2.0)
Bicarbonate: 12.9 mmol/L — ABNORMAL LOW (ref 20.0–28.0)
Bicarbonate: 16.5 mmol/L — ABNORMAL LOW (ref 20.0–28.0)
FIO2: 0.25
FIO2: 0.6
MECHVT: 500 mL
O2 Saturation: 96 %
O2 Saturation: 93.1 %
PEEP: 0.5 cmH2O
PEEP: 10 cmH2O
Patient temperature: 37
Patient temperature: 37
RATE: 20 resp/min
RATE: 24 resp/min
pCO2 arterial: 28 mmHg — ABNORMAL LOW (ref 32.0–48.0)
pCO2 arterial: 57 mmHg — ABNORMAL HIGH (ref 32.0–48.0)
pH, Arterial: 7.27 — ABNORMAL LOW (ref 7.350–7.450)
pH, Arterial: 7.07 — CL (ref 7.350–7.450)
pO2, Arterial: 93 mmHg (ref 83.0–108.0)
pO2, Arterial: 93 mmHg (ref 83.0–108.0)

## 2021-02-14 LAB — RENAL FUNCTION PANEL
Albumin: 1.9 g/dL — ABNORMAL LOW (ref 3.5–5.0)
Anion gap: 29 — ABNORMAL HIGH (ref 5–15)
BUN: 56 mg/dL — ABNORMAL HIGH (ref 6–20)
CO2: 15 mmol/L — ABNORMAL LOW (ref 22–32)
Calcium: 5 mg/dL — CL (ref 8.9–10.3)
Chloride: 87 mmol/L — ABNORMAL LOW (ref 98–111)
Creatinine, Ser: 6.54 mg/dL — ABNORMAL HIGH (ref 0.61–1.24)
GFR, Estimated: 10 mL/min — ABNORMAL LOW (ref >60)
Glucose, Bld: 174 mg/dL — ABNORMAL HIGH (ref 70–99)
Phosphorus: 17 mg/dL — ABNORMAL HIGH (ref 2.5–4.6)
Potassium: 6.5 mmol/L (ref 3.5–5.1)
Sodium: 131 mmol/L — ABNORMAL LOW (ref 135–145)

## 2021-02-14 LAB — BASIC METABOLIC PANEL
Anion gap: 23 — ABNORMAL HIGH (ref 5–15)
BUN: 95 mg/dL — ABNORMAL HIGH (ref 6–20)
CO2: 21 mmol/L — ABNORMAL LOW (ref 22–32)
Calcium: 4.5 mg/dL — CL (ref 8.9–10.3)
Chloride: 87 mmol/L — ABNORMAL LOW (ref 98–111)
Creatinine, Ser: 7.9 mg/dL — ABNORMAL HIGH (ref 0.61–1.24)
GFR, Estimated: 8 mL/min — ABNORMAL LOW (ref >60)
Glucose, Bld: 194 mg/dL — ABNORMAL HIGH (ref 70–99)
Potassium: 6.4 mmol/L (ref 3.5–5.1)
Sodium: 131 mmol/L — ABNORMAL LOW (ref 135–145)

## 2021-02-14 LAB — GLUCOSE, CAPILLARY
Glucose-Capillary: 83 mg/dL (ref 70–99)
Glucose-Capillary: 51 mg/dL — ABNORMAL LOW (ref 70–99)
Glucose-Capillary: 54 mg/dL — ABNORMAL LOW (ref 70–99)
Glucose-Capillary: 197 mg/dL — ABNORMAL HIGH (ref 70–99)
Glucose-Capillary: 52 mg/dL — ABNORMAL LOW (ref 70–99)
Glucose-Capillary: 176 mg/dL — ABNORMAL HIGH (ref 70–99)
Glucose-Capillary: 109 mg/dL — ABNORMAL HIGH (ref 70–99)
Glucose-Capillary: 142 mg/dL — ABNORMAL HIGH (ref 70–99)
Glucose-Capillary: 71 mg/dL (ref 70–99)
Glucose-Capillary: 13 mg/dL — CL (ref 70–99)
Glucose-Capillary: 44 mg/dL — CL (ref 70–99)
Glucose-Capillary: 148 mg/dL — ABNORMAL HIGH (ref 70–99)
Glucose-Capillary: 143 mg/dL — ABNORMAL HIGH (ref 70–99)
Glucose-Capillary: 68 mg/dL — ABNORMAL LOW (ref 70–99)
Glucose-Capillary: 161 mg/dL — ABNORMAL HIGH (ref 70–99)

## 2021-02-14 LAB — CBC
HCT: 33.5 % — ABNORMAL LOW (ref 39.0–52.0)
HCT: 27.5 % — ABNORMAL LOW (ref 39.0–52.0)
Hemoglobin: 11.1 g/dL — ABNORMAL LOW (ref 13.0–17.0)
Hemoglobin: 8.4 g/dL — ABNORMAL LOW (ref 13.0–17.0)
MCH: 26.5 pg (ref 26.0–34.0)
MCH: 26.4 pg (ref 26.0–34.0)
MCHC: 33.1 g/dL (ref 30.0–36.0)
MCHC: 30.5 g/dL (ref 30.0–36.0)
MCV: 80 fL (ref 80.0–100.0)
MCV: 86.5 fL (ref 80.0–100.0)
Platelets: 186 10*3/uL (ref 150–400)
Platelets: 72 10*3/uL — ABNORMAL LOW (ref 150–400)
RBC: 4.19 MIL/uL — ABNORMAL LOW (ref 4.22–5.81)
RBC: 3.18 MIL/uL — ABNORMAL LOW (ref 4.22–5.81)
RDW: 17.7 % — ABNORMAL HIGH (ref 11.5–15.5)
RDW: 18.6 % — ABNORMAL HIGH (ref 11.5–15.5)
WBC: 11.4 10*3/uL — ABNORMAL HIGH (ref 4.0–10.5)
WBC: 11 10*3/uL — ABNORMAL HIGH (ref 4.0–10.5)
nRBC: 0.2 % (ref 0.0–0.2)
nRBC: 0.8 % — ABNORMAL HIGH (ref 0.0–0.2)

## 2021-02-14 LAB — ECHOCARDIOGRAM COMPLETE
AV Mean grad: 3 mmHg
AV Peak grad: 6 mmHg
Ao pk vel: 1.22 m/s
Area-P 1/2: 3.39 cm2
Height: 74 in
Single Plane A4C EF: 34.9 %
Weight: 2828.94 oz

## 2021-02-14 LAB — BLOOD GAS, VENOUS
Acid-base deficit: 8.6 mmol/L — ABNORMAL HIGH (ref 0.0–2.0)
Bicarbonate: 20.6 mmol/L (ref 20.0–28.0)
FIO2: 0.8
MECHVT: 500 mL
O2 Saturation: 92 %
PEEP: 10 cmH2O
Patient temperature: 37
RATE: 20 resp/min
pCO2, Ven: 59 mmHg (ref 44.0–60.0)
pH, Ven: 7.15 — CL (ref 7.250–7.430)
pO2, Ven: 82 mmHg — ABNORMAL HIGH (ref 32.0–45.0)

## 2021-02-14 LAB — MAGNESIUM: Magnesium: 2.1 mg/dL (ref 1.7–2.4)

## 2021-02-14 LAB — PHOSPHORUS: Phosphorus: 10.7 mg/dL — ABNORMAL HIGH (ref 2.5–4.6)

## 2021-02-14 LAB — LACTIC ACID, PLASMA: Lactic Acid, Venous: >11 mmol/L (ref 0.5–1.9)

## 2021-02-14 LAB — PROCALCITONIN: Procalcitonin: 85.56 ng/mL

## 2021-02-14 LAB — HEPATITIS B SURFACE ANTIBODY, QUANTITATIVE: Hep B S AB Quant (Post): 11.6 m[IU]/mL (ref >9.9)

## 2021-02-14 MED ORDER — PROSOURCE TF PO LIQD
45.0000 mL | Freq: Three times a day (TID) | ORAL | Status: DC
  Filled 2021-02-14: qty 45

## 2021-02-14 MED ORDER — MIDODRINE HCL 5 MG PO TABS: 10.0000 mg | ORAL_TABLET | Freq: Three times a day (TID) | ORAL | Status: DC

## 2021-02-14 MED ORDER — PRISMASOL BGK 0/2.5 32-2.5 MEQ/L EC SOLN
Status: DC
  Filled 2021-02-14: qty 5000

## 2021-02-14 MED ORDER — SODIUM BICARBONATE 8.4 % IV SOLN
INTRAVENOUS | Status: DC
  Filled 2021-02-14 (×2): qty 1000
  Filled 2021-02-14: qty 150
  Filled 2021-02-14: qty 1000

## 2021-02-14 MED ORDER — HEPARIN SODIUM (PORCINE) 1000 UNIT/ML DIALYSIS
1000.0000 [IU] | INTRAMUSCULAR | Status: DC | PRN
  Filled 2021-02-14 (×2): qty 6

## 2021-02-14 MED ORDER — SODIUM BICARBONATE 8.4 % IV SOLN
100.0000 meq | Freq: Once | INTRAVENOUS | Status: AC
  Administered 2021-02-14: 100 meq via INTRAVENOUS

## 2021-02-14 MED ORDER — EPINEPHRINE HCL 5 MG/250ML IV SOLN IN NS
0.5000 ug/min | INTRAVENOUS | Status: DC
  Administered 2021-02-14: 5 ug/min via INTRAVENOUS
  Administered 2021-02-15: 20 ug/min via INTRAVENOUS
  Filled 2021-02-14 (×2): qty 250

## 2021-02-14 MED ORDER — DEXTROSE 50 % IV SOLN
1.0000 | Freq: Once | INTRAVENOUS | Status: AC
  Administered 2021-02-14: 50 mL via INTRAVENOUS
  Filled 2021-02-14: qty 50

## 2021-02-14 MED ORDER — DEXTROSE 10 % IV SOLN: INTRAVENOUS | Status: DC

## 2021-02-14 MED ORDER — SODIUM CHLORIDE 0.9 % FOR CRRT
INTRAVENOUS_CENTRAL | Status: DC | PRN
  Filled 2021-02-14: qty 1000

## 2021-02-14 MED ORDER — SODIUM CHLORIDE 0.9 % IV SOLN
500.0000 mg | INTRAVENOUS | Status: DC
  Administered 2021-02-14: 500 mg via INTRAVENOUS
  Filled 2021-02-14 (×2): qty 500

## 2021-02-14 MED ORDER — METRONIDAZOLE IN NACL 5-0.79 MG/ML-% IV SOLN
500.0000 mg | Freq: Three times a day (TID) | INTRAVENOUS | Status: DC
  Administered 2021-02-14: 500 mg via INTRAVENOUS
  Filled 2021-02-14 (×2): qty 100

## 2021-02-14 MED ORDER — DEXTROSE 50 % IV SOLN: 25.0000 g | INTRAVENOUS | Status: AC

## 2021-02-14 MED ORDER — HYDROCORTISONE NA SUCCINATE PF 100 MG IJ SOLR
50.0000 mg | Freq: Four times a day (QID) | INTRAMUSCULAR | Status: DC
  Administered 2021-02-14 (×3): 50 mg via INTRAVENOUS
  Filled 2021-02-14 (×3): qty 2

## 2021-02-14 MED ORDER — VASOPRESSIN 20 UNITS/100 ML INFUSION FOR SHOCK
0.0000 [IU]/min | INTRAVENOUS | Status: DC
  Administered 2021-02-14 – 2021-02-15 (×3): 0.03 [IU]/min via INTRAVENOUS
  Filled 2021-02-14 (×2): qty 100

## 2021-02-14 NOTE — Progress Notes (Signed)
*  PRELIMINARY RESULTS* Echocardiogram 2D Echocardiogram has been performed.  Michael Lester Michael Lester Michael Lester Michael Lester 02/14/2021, 8:42 AM

## 2021-02-14 NOTE — Progress Notes (Signed)
Dr. Wynelle Link in to examine pt at 1050, states to keep map greater than 60. Per md if map less than 60, take pt off dialysis.

## 2021-02-14 NOTE — Consult Note (Signed)
PHARMACY CONSULT NOTE - FOLLOW UP  Pharmacy Consult for Electrolyte Monitoring and Replacement   Recent Labs: Potassium (mmol/L)  Date Value  02/14/2021 6.4 (HH)   Magnesium (mg/dL)  Date Value  85/88/5027 2.1   Calcium (mg/dL)  Date Value  74/09/8785 4.5 (LL)   Albumin (g/dL)  Date Value  76/72/0947 3.3 (L)   Phosphorus (mg/dL)  Date Value  09/62/8366 10.7 (H)   Sodium (mmol/L)  Date Value  02/14/2021 131 (L)   Corr Ca: 5.06 mg/dL  Assessment: 35 year old male with known past medical history of polysubstance abuse including IV drug abuse, presenting to [facility] ED via EMS after unknown illicit substance ingestion.  Vital 1.5 @30ml /hr Free water 28ml q4h  Nephrology following. Evaluating daily for dialysis, s/p treatment 4/18 and 4/19.  Goal of Therapy:  Electrolytes WNL  Plan:   Dialyzed today  Continue to follow Nephrology's plan closely  5/19, PharmD Clinical Pharmacist 02/14/2021 1:35 PM

## 2021-02-14 NOTE — Progress Notes (Signed)
Dr. Wynelle Link updated via secure text of blood pressure, hr and pressor rates

## 2021-02-14 NOTE — Procedures (Signed)
Arterial Catheter Insertion Procedure Note  Michael Lester  [id number]  12/09/1985  Date:02/14/21  Time:10:11 PM    Provider Performing: Judithe Modest    Procedure: Insertion of Arterial Line (56256) with US guidance (38937)   Indication(s) Blood pressure monitoring and/or need for frequent ABGs  Consent Unable to obtain consent due to emergent nature of procedure.  Anesthesia None   Time Out Verified patient identification, verified procedure, site/side was marked, verified correct patient position, special equipment/implants available, medications/allergies/relevant history reviewed, required imaging and test results available.   Sterile Technique Maximal sterile technique including full sterile barrier drape, hand hygiene, sterile gown, sterile gloves, mask, hair covering, sterile ultrasound probe cover (if used).   Procedure Description Area of catheter insertion was cleaned with chlorhexidine and draped in sterile fashion. With real-time ultrasound guidance an arterial catheter was placed into the left femoral artery.  Appropriate arterial tracings confirmed on monitor.     Complications/Tolerance None; patient tolerated the procedure well.   EBL Minimal   Specimen(s) None  BIOPATCH applied to the insertion site.    Harlon Ditty, AGACNP-BC [facility] Prefer epic messenger for cross cover needs If after hours, please call E-link

## 2021-02-14 NOTE — Progress Notes (Signed)
Dr. Wynelle Link notified of increased HR, no new orders received.

## 2021-02-14 NOTE — Progress Notes (Signed)
Patient presenting with new emesis. NP Harlon Ditty notified. VO received by NP to stop TFs and place OGT to LIWS. Orders executed.

## 2021-02-14 NOTE — Progress Notes (Signed)
 NAME:  Michael Lester, MRN:  [id number], DOB:  09/16/1986, LOS: 2 ADMISSION DATE:  01/27/2021  History of Present Illness:  35-year-old male with known past medical history of polysubstance abuse including IV drug abuse,presenting to [facility] ED via EMS after unknown illicit substance ingestion.Per ED documentation EMS reported the patient had been tachycardic and hypertensive throughout transport.Patient's mental status has been such that no further history collected,suspected methamphetamine and fentanyl overdose. ED course: Per ED documentation patient has uncontrollable movements,unable to be redirected.While in the ED the patient throwing himself on the floor,was banging head on boards of the bed and thrashing around.He received 4 mg of Ativan IM,bed was removed and multiple formats placed for patient safety. Initial vitals:Febrile 100.8,tachypneic at 30,tachycardic at 111,hypotensive at 74/40 with SPO2 95% on room air. Significant labs:Hyponatremic at 127,severely hyperkalemic at 7.4,hypochloremic at 87,high AG metabolic acidosis with serum CO2 at 12 and AG 28,acute renal failure with BUN/Cr89/7.49,hypocalcemiaat 5.8, Transaminitis:Alk phos 152,AST 1385,ALT 253,total bilirubin 1.5,BNP elevated at 125.9,troponin elevated at 321,leukocytosis at 14.3,osmolality gap Of 16 with serum osmolality at 304,serum alcohol less than 10,hypoglycemia 36.VBG showing metabolic acidosis:7.11/37/45/11.8. The patient continued to be altered,nonredirectable due to acute encephalopathy the decision was made for discussing the case with Dr.Bradler to emergently intubate the patient and placed on mechanical ventilation.Dr. Kolluru was emergently consulted and is coming to assess the patient bedside.  PCCM consulted for admission. Pertinent Medical History  Polysubstance abuse including IV drugs, ETOH & Marijuana Seizures cervical osteomyelitis s/p surgical intervention  2021 Chronic Hep C Accidental drug overdose ( most recent 02/01/21) CAD Prior N-STEMI  Significant Hospital Events: Including procedures, antibiotic start and stop dates in addition to other pertinent events   02/25/2021- Admit to ICU with suspected fentanyl/methamphetamine OD and acute renal failure  4/18 severe hypoxia, VASC CATH PLACED RT FEMORAL, +HD  4/19 severe shock on pressors,     Interim History / Subjective:  Remains critically ill multiorgan failure Severe resp failure    Micro Data:  COVID NEG MRSA +   Antimicrobials:   Anti-infectives (From admission, onward)   Start     Dose/Rate Route Frequency Ordered Stop   02/14/21 0515  azithromycin (ZITHROMAX) 500 mg in sodium chloride 0.9 % 250 mL IVPB        500 mg 250 mL/hr over 60 Minutes Intravenous Every 24 hours 02/14/21 0417     02/13/21 1200  cefTRIAXone (ROCEPHIN) 2 g in sodium chloride 0.9 % 100 mL IVPB        2 g 200 mL/hr over 30 Minutes Intravenous Every 24 hours 02/13/21 1002              Objective   Blood pressure (!) 89/53, pulse (!) 107, temperature (!) 96.8 F (36 C), resp. rate (!) 23, height 6' 2" (1.88 m), weight 80.2 kg, SpO2 96 %.    Vent Mode: PRVC FiO2 (%):  [50 %-100 %] 80 % Set Rate:  [20 bmp-24 bmp] 24 bmp Vt Set:  [500 mL] 500 mL PEEP:  [5 cmH20-10 cmH20] 10 cmH20 Plateau Pressure:  [20 cmH20] 20 cmH20   Intake/Output Summary (Last 24 hours) at 02/14/2021 0730 Last data filed at 02/14/2021 0600 Gross per 24 hour  Intake 6025.42 ml  Output 55 ml  Net 5970.42 ml   Filed Weights   02/24/2021 1607 02/13/21 0354 02/14/21 0500  Weight: 75 kg 75 kg 80.2 kg      REVIEW OF SYSTEMS  PATIENT IS UNABLE TO PROVIDE COMPLETE REVIEW OF   SYSTEMS DUE TO SEVERE CRITICAL ILLNESS AND TOXIC METABOLIC ENCEPHALOPATHY   PHYSICAL EXAMINATION:  GENERAL:critically ill appearing, +resp distress HEAD: Normocephalic, atraumatic.  EYES: Pupils equal, round, reactive to light.  No  scleral icterus.  MOUTH: Moist mucosal membrane. NECK: Supple. PULMONARY: +rhonchi, +wheezing CARDIOVASCULAR: S1 and S2. Regular rate and rhythm. No murmurs, rubs, or gallops.  GASTROINTESTINAL: Soft, nontender, -distended. Positive bowel sounds.  MUSCULOSKELETAL: No swelling, clubbing, or edema.  NEUROLOGIC: obtunded SKIN:intact,warm,dry   Labs/imaging that I havepersonally reviewed  (right click and "Reselect all SmartList Selections" daily)  .     ASSESSMENT AND PLAN SYNOPSIS   ADMITTED FOR MULTIORGAN FAILURE DUE TO POLYSUBSTANCE ABUSE AND DRUG OD AND POISONING WITH SEVERE HYPOXIC RESPIRATORY FAILURE,ASPIRATION PNEUMONIA WITH LIVER FAILURE, RENAL FAILURE SEVERE METABOLIC ACIDOSIS WITH DISTRIBUTIVE SHOCK FROM DRUG ABUSE   Severe ACUTE Hypoxic and Hypercapnic Respiratory Failure -continue Mechanical Ventilator support -continue Bronchodilator Therapy -Wean Fio2 and PEEP as tolerated -VAP/VENT bundle implementation Vent Mode: PRVC FiO2 (%):  [50 %-100 %] 80 % Set Rate:  [20 bmp-24 bmp] 24 bmp Vt Set:  [500 mL] 500 mL PEEP:  [5 cmH20-10 cmH20] 10 cmH20 Plateau Pressure:  [20 cmH20] 20 cmH20   SEVERE DRUG/ALCOHOL WITHDRAWAL -Therapy with Thiamine and MVI  CARDIAC FAILURE-acute combined systolic/diastolic dysfunction Check ECHO   CARDIAC ICU monitoring   ACUTE KIDNEY INJURY/Renal Failure -continue Foley Catheter-assess need -Avoid nephrotoxic agents -Follow urine output, BMP -Ensure adequate renal perfusion, optimize oxygenation -Renal dose medications HD as needed   NEUROLOGY Acute toxic metabolic encephalopathy, need for sedation Goal RASS -2 to -3   SEPTIC SHOCK -use vasopressors to keep MAP>65 as needed -follow ABG and LA -follow up cultures -emperic ABX -consider stress dose steroids -aggressive IV fluid resuscitation  INFECTIOUS DISEASE  -continue antibiotics as prescribed -follow up cultures   ENDO - ICU hypoglycemic\Hyperglycemia  protocol -check FSBS per protocol   GI GI PROPHYLAXIS as indicated  NUTRITIONAL STATUS DIET-->TF's as tolerated Constipation protocol as indicated   ELECTROLYTES -follow labs as needed -replace as needed -pharmacy consultation and following    Best practice (right click and "Reselect all SmartList Selections" daily)  Diet:NPO Pain/Anxiety/Delirium protocol (if indicated):Yes(RASS goal -2) VAP protocol (if indicated):Yes DVT prophylaxis:Subcutaneous Heparin GI prophylaxis:PPI Glucose control:SSINo Central venous access:N/A Arterial line:N/A Foley:Yes, and it is still needed Mobility:bed rest PT consulted:N/A Last date of multidisciplinary goals of care discussion04/19/2022- pt unable to participate in care, called NOK in epic- father Code Status:full code Disposition:ICU    Labs   CBC: Recent Labs  Lab 02/01/2021 1754 02/13/21 0527 02/14/21 0448  WBC 14.3* 8.5 11.4*  NEUTROABS 11.4*  --   --   HGB 12.5* 11.7* 11.1*  HCT 37.2* 33.9* 33.5*  MCV 79.0* 77.6* 80.0  PLT 279 200 710    Basic Metabolic Panel: Recent Labs  Lab 02/02/2021 1754 02/18/2021 1935 02/13/21 0118 02/13/21 0527 02/14/21 0448  NA 127* 126* 128* 127* 131*  K 7.4* 6.5* 6.6* 6.2* 6.4*  CL 87* 91* 91* 90* 87*  CO2 12* 10* 16* 15* 21*  GLUCOSE 36* 130* 157* 179* 194*  BUN 89* 88* 98* 103* 95*  CREATININE 7.49* 7.02* 7.54* 8.21* 7.90*  CALCIUM 5.8* 5.2* 5.5* 5.5* 4.5*  MG  --  2.4  --  2.6* 2.1  PHOS  --  14.3*  --  11.7* 10.7*   GFR: Estimated Creatinine Clearance: 14.5 mL/min (A) (by C-G formula based on SCr of 7.9 mg/dL (H)). Recent Labs  Lab 02/12/21 1754 02/13/21 0118 02/13/21  0257 02/13/21 0527 02/14/21 0448  PROCALCITON  --   --   --   --  85.56  WBC 14.3*  --   --  8.5 11.4*  LATICACIDVEN  --  1.4 1.6  --   --     Liver Function Tests: Recent Labs  Lab 02/17/2021 1754 02/17/2021 1935 02/13/21 0527  AST 1,385* 1,379* 1,328*  ALT 253* 274* 307*   ALKPHOS 152* 140* 130*  BILITOT 1.5* 1.3* 1.2  PROT 7.6 6.7 6.2*  ALBUMIN 4.1 3.8 3.3*   Recent Labs  Lab 02/04/2021 1754  LIPASE 26   No results for input(s): AMMONIA in the last 168 hours.  ABG    Component Value Date/Time   PHART 7.27 (L) 02/13/2021 0548   PCO2ART 28 (L) 02/13/2021 0548   PO2ART 93 02/13/2021 0548   HCO3 20.6 02/13/2021 0548   HCO3 12.9 (L) 02/13/2021 0548   ACIDBASEDEF 8.6 (H) 02/13/2021 0548   ACIDBASEDEF 12.6 (H) 02/13/2021 0548   O2SAT 92.0 02/13/2021 0548   O2SAT 96.0 02/13/2021 0548     Coagulation Profile: No results for input(s): INR, PROTIME in the last 168 hours.  Cardiac Enzymes: No results for input(s): CKTOTAL, CKMB, CKMBINDEX, TROPONINI in the last 168 hours.  HbA1C: Hgb A1c MFr Bld  Date/Time Value Ref Range Status  05/02/2020 01:11 AM 5.5 4.8 - 5.6 % Final    Comment:    (NOTE) Pre diabetes:          5.7%-6.4%  Diabetes:              >6.4%  Glycemic control for   <7.0% adults with diabetes     CBG: Recent Labs  Lab 02/14/21 0343 02/14/21 0345 02/14/21 0438 02/14/21 0441 02/14/21 0600  GLUCAP 54* 51* 52* 197* 176*    Allergies Allergies  Allergen Reactions  . Shellfish Allergy Anaphylaxis       DVT/GI PRX  assessed I Assessed the need for Labs I Assessed the need for Foley I Assessed the need for Central Venous Line Family Discussion when available I Assessed the need for Mobilization I made an Assessment of medications to be adjusted accordingly Safety Risk assessment completed  CASE DISCUSSED IN MULTIDISCIPLINARY ROUNDS WITH ICU TEAM     Critical Care Time devoted to patient care services described in this note is 58 minutes.   Overall, patient is critically ill, prognosis is guarded.  Patient with Multiorgan failure and at high risk for cardiac arrest and death.     David , M.D.  [facility]  Medical Director ICU-[facility] [place] Medical Director [facility]  Cardio-Pulmonary Department        

## 2021-02-14 NOTE — Progress Notes (Addendum)
BRIEF CRITICAL CARE NOTE  Brief Pt Description 35 y.o. Male admitted with Polysubstance drug overdose leading to Acute Hypoxic Respiratory Failure with Aspiration Pneumonia, severe septic shock, and Multiorgan failure (liver, Kidney) with severe metabolic acidosis.   Tonight pt severely hypotensive with increasing vasopressor requirements, including addition of EPI drip.  Discussed with Dr. Belia Heman, concern for possible severe metabolic acidosis contributing to shock.  Will obtain lactic acid, ABG, CBC, and Renal panel.  Will also place arterial line for BP monitoring.  Will also add Flagyl to current ABX regimen of Azithromycin and Rocephin for possible intraabdominal/Aspiration coverage.  Once Arterial line placed, SBP 130's and MAP >65 (manuel BP cuff was not correlating).  Awaiting lab results.  Pt may possibly need CRRT.  Dr. Belia Heman discussing with Dr. Wynelle Link.   22:30 ABG resulted: pH 6.92/pCO2 57/Bicarb 11.7/pO2 197.  Awaiting renal panel and CBC results.  Will increase vent rate to 28 (currently on 24), give 2 amps Bicarb, increase bicarb gtt to 150 ml/hr. Dr. Belia Heman discussed with Dr. Wynelle Link, plan to start on CRRT.  Dr. Wynelle Link to obtain consent for CRRT from family.    Pt is critically ill, prognosis is extremely guarded.  High risk for cardiac arrest and death.    Critical Care time: 30 minutes  Harlon Ditty, AGACNP-BC [facility] Prefer epic messenger for cross cover needs If after hours, please call E-link

## 2021-02-14 NOTE — Progress Notes (Signed)
[facility]  ROUNDING NOTE   Subjective:   UOP 81m Now on norepinephrine, vasopressin.   Intermittent Hemodialysis scheduled for today  Objective:  Vital signs in last 24 hours:  Temp:  [96.8 F (36 C)-101.12 F (38.4 C)] 96.8 F (36 C) (04/19 0845) Pulse Rate:  [105-128] 106 (04/19 0845) Resp:  [11-33] 18 (04/19 0845) BP: (77-119)/(37-88) 119/88 (04/19 0830) SpO2:  [83 %-100 %] 96 % (04/19 0845) FiO2 (%):  [50 %-100 %] 60 % (04/19 0843) Weight:  [80.2 kg] 80.2 kg (04/19 0500)  Weight change: 5.2 kg Filed Weights   02/17/2021 1607 02/13/21 0354 02/14/21 0500  Weight: 75 kg 75 kg 80.2 kg    Intake/Output: I/O last 3 completed shifts: In: 8026.4 [I.V.:4282.5; NG/GT:400; IV Piggyback:3343.9] Out: 105 [Urine:105]   Intake/Output this shift:  No intake/output data recorded.  Physical Exam: General: Critically ill  Head: ETT   Eyes: Anicteric   Neck:   trachea midline  Lungs:  PRVC FiO2 60%  Heart: tachycardia  Abdomen:  Soft    Extremities:  no peripheral edema.  Neurologic: Intubated and sedated  Skin: No lesions  Access: Right femoral temp HD catheter 48/31   Basic Metabolic Panel: Recent Labs  Lab 02/13/2021 1754 02/20/2021 1935 02/13/21 0118 02/13/21 0527 02/14/21 0448  NA 127* 126* 128* 127* 131*  K 7.4* 6.5* 6.6* 6.2* 6.4*  CL 87* 91* 91* 90* 87*  CO2 12* 10* 16* 15* 21*  GLUCOSE 36* 130* 157* 179* 194*  BUN 89* 88* 98* 103* 95*  CREATININE 7.49* 7.02* 7.54* 8.21* 7.90*  CALCIUM 5.8* 5.2* 5.5* 5.5* 4.5*  MG  --  2.4  --  2.6* 2.1  PHOS  --  14.3*  --  11.7* 10.7*    Liver Function Tests: Recent Labs  Lab 01/31/2021 1754 02/11/2021 1935 02/13/21 0527  AST 1,385* 1,379* 1,328*  ALT 253* 274* 307*  ALKPHOS 152* 140* 130*  BILITOT 1.5* 1.3* 1.2  PROT 7.6 6.7 6.2*  ALBUMIN 4.1 3.8 3.3*   Recent Labs  Lab 02/16/2021 1754  LIPASE 26   No results for input(s): AMMONIA in the last 168 hours.  CBC: Recent Labs  Lab 02/11/2021 1754  02/13/21 0527 02/14/21 0448  WBC 14.3* 8.5 11.4*  NEUTROABS 11.4*  --   --   HGB 12.5* 11.7* 11.1*  HCT 37.2* 33.9* 33.5*  MCV 79.0* 77.6* 80.0  PLT 279 200 186    Cardiac Enzymes: No results for input(s): CKTOTAL, CKMB, CKMBINDEX, TROPONINI in the last 168 hours.  BNP: Invalid input(s): POCBNP  CBG: Recent Labs  Lab 02/14/21 0345 02/14/21 0438 02/14/21 0441 02/14/21 0600 02/14/21 0755  GLUCAP 51* 52* 197* 176* 142*    Microbiology: Results for orders placed or performed during the hospital encounter of 02/16/2021  CULTURE, BLOOD (ROUTINE X 2) w Reflex to ID Panel     Status: None (Preliminary result)   Collection Time: 01/30/2021  7:48 PM   Specimen: BLOOD  Result Value Ref Range Status   Specimen Description BLOOD LEFT HAND  Final   Special Requests   Final    BOTTLES DRAWN AEROBIC AND ANAEROBIC Blood Culture adequate volume   Culture   Final    NO GROWTH 2 DAYS [facility]formed [facility] [address], [place] [address]   Report Status PENDING  Incomplete  CULTURE, BLOOD (ROUTINE X 2) w Reflex to ID Panel     Status: None (Preliminary result)   Collection Time:  01/30/2021  7:48 PM   Specimen: BLOOD  Result Value Ref Range Status   Specimen Description   Final    BLOOD LEFT HAND Performed at [facility], [address] [place], [place] [address]    Special Requests   Final    BOTTLES DRAWN AEROBIC AND ANAEROBIC Blood Culture adequate volume Performed at [address] [place], [place] [address]    Culture  Setup Time   Final    GRAM POSITIVE COCCI ANAEROBIC BOTTLE ONLY CRITICAL RESULT CALLED TO, READ BACK BY AND VERIFIED WITH: MYRA SLAUGHTER AT 0800 02/13/21 SDR    Culture   Final    GRAM POSITIVE COCCI IDENTIFICATION TO FOLLOW Performed at [facility], [place] [address] [place], [place] [address]    Report Status PENDING  Incomplete  Blood Culture ID Panel (Reflexed)     Status: Abnormal    Collection Time: 02/04/2021  7:48 PM  Result Value Ref Range Status   Enterococcus faecalis NOT DETECTED NOT DETECTED Final   Enterococcus Faecium NOT DETECTED NOT DETECTED Final   Listeria monocytogenes NOT DETECTED NOT DETECTED Final   Staphylococcus species NOT DETECTED NOT DETECTED Final   Staphylococcus aureus (BCID) NOT DETECTED NOT DETECTED Final   Staphylococcus epidermidis NOT DETECTED NOT DETECTED Final   Staphylococcus lugdunensis NOT DETECTED NOT DETECTED Final   Streptococcus species DETECTED (A) NOT DETECTED Final    Comment: Not Enterococcus species, Streptococcus agalactiae, Streptococcus pyogenes, or Streptococcus pneumoniae. CRITICAL RESULT CALLED TO, READ BACK BY AND VERIFIED WITH:  MYRA SLAUGHTER AT 0800 02/13/21 SDR    Streptococcus agalactiae NOT DETECTED NOT DETECTED Final   Streptococcus pneumoniae NOT DETECTED NOT DETECTED Final   Streptococcus pyogenes NOT DETECTED NOT DETECTED Final   A.calcoaceticus-baumannii NOT DETECTED NOT DETECTED Final   Bacteroides fragilis NOT DETECTED NOT DETECTED Final   Enterobacterales NOT DETECTED NOT DETECTED Final   Enterobacter cloacae complex NOT DETECTED NOT DETECTED Final   Escherichia coli NOT DETECTED NOT DETECTED Final   Klebsiella aerogenes NOT DETECTED NOT DETECTED Final   Klebsiella oxytoca NOT DETECTED NOT DETECTED Final   Klebsiella pneumoniae NOT DETECTED NOT DETECTED Final   Proteus species NOT DETECTED NOT DETECTED Final   Salmonella species NOT DETECTED NOT DETECTED Final   Serratia marcescens NOT DETECTED NOT DETECTED Final   Haemophilus influenzae NOT DETECTED NOT DETECTED Final   Neisseria meningitidis NOT DETECTED NOT DETECTED Final   Pseudomonas aeruginosa NOT DETECTED NOT DETECTED Final   Stenotrophomonas maltophilia NOT DETECTED NOT DETECTED Final   Candida albicans NOT DETECTED NOT DETECTED Final   Candida auris NOT DETECTED NOT DETECTED Final   Candida glabrata NOT DETECTED NOT DETECTED Final   Candida  krusei NOT DETECTED NOT DETECTED Final   Candida parapsilosis NOT DETECTED NOT DETECTED Final   Candida tropicalis NOT DETECTED NOT DETECTED Final   Cryptococcus neoformans/gattii NOT DETECTED NOT DETECTED Final    Comment: Performed at [facility], [address] [place], [place] [address]  Resp Panel by RT-PCR (Flu A&B, Covid)     Status: None   Collection Time: 02/17/2021  7:57 PM  Result Value Ref Range Status   SARS Coronavirus 2 by RT PCR NEGATIVE NEGATIVE Final    Comment: (NOTE) SARS-CoV-2 target nucleic acids are NOT DETECTED.  The SARS-CoV-2 RNA is generally detectable in upper respiratory specimens during the acute phase of infection. The lowest concentration of SARS-CoV-2 viral copies this assay can detect is 138 copies/mL. A negative  result does not preclude SARS-Cov-2 infection and should not be used as the sole basis for treatment or other patient management decisions. A negative result may occur with  improper specimen collection/handling, submission of specimen other than nasopharyngeal swab, presence of viral mutation(s) within the areas targeted by this assay, and inadequate number of viral copies(<138 copies/mL). A negative result must be combined with clinical observations, patient history, and epidemiological information. The expected result is Negative.  Fact Sheet for Patients:  EntrepreneurPulse.com.au  Fact Sheet for Healthcare Providers:  IncredibleEmployment.be  This test is no t yet approved or cleared by the [place] FDA and  has been authorized for detection and/or diagnosis of SARS-CoV-2 by FDA under an Emergency Use Authorization (EUA). This EUA will remain  in effect (meaning this test can be used) for the duration of the COVID-19 declaration under Section 564(b)(1) of the Act, 21 U.S.C.section 360bbb-3(b)(1), unless the authorization is terminated  or revoked sooner.       Influenza A by PCR  NEGATIVE NEGATIVE Final   Influenza B by PCR NEGATIVE NEGATIVE Final    Comment: (NOTE) The Xpert Xpress SARS-CoV-2/FLU/RSV plus assay is intended as an aid in the diagnosis of influenza from Nasopharyngeal swab specimens and should not be used as a sole basis for treatment. Nasal washings and aspirates are unacceptable for Xpert Xpress SARS-CoV-2/FLU/RSV testing.  Fact Sheet for Patients: EntrepreneurPulse.com.au  Fact Sheet for Healthcare Providers: IncredibleEmployment.be  This test is not yet approved or cleared by the [place] FDA and has been authorized for detection and/or diagnosis of SARS-CoV-2 by FDA under an Emergency Use Authorization (EUA). This EUA will remain in effect (meaning this test can be used) for the duration of the COVID-19 declaration under Section 564(b)(1) of the Act, 21 U.S.C. section 360bbb-3(b)(1), unless the authorization is terminated or revoked.  Performed at [facility], [address] [place], [place] [address]   MRSA PCR Screening     Status: Abnormal   Collection Time: 02/13/21  1:00 AM   Specimen: Nasopharyngeal  Result Value Ref Range Status   MRSA by PCR POSITIVE (A) NEGATIVE Final    Comment:        The GeneXpert MRSA Assay (FDA approved for NASAL specimens only), is one component of a comprehensive MRSA colonization surveillance program. It is not intended to diagnose MRSA infection nor to guide or monitor treatment for MRSA infections. RESULT CALLED TO, READ BACK BY AND VERIFIED WITH: NOTIFIED Donnamae Jude RN 02/13/2021 0209 [place] Performed at [facility], [address] [place], [place] [address]   CULTURE, BLOOD (ROUTINE X 2) w Reflex to ID Panel     Status: None (Preliminary result)   Collection Time: 02/13/21 11:23 AM   Specimen: BLOOD  Result Value Ref Range Status   Specimen Description BLOOD BLOOD LEFT WRIST  Final   Special Requests   Final    BOTTLES DRAWN  AEROBIC ONLY Blood Culture adequate volume   Culture   Final    NO GROWTH < 24 HOURS Performed at [facility], [address], [place], [place] [address]    Report Status PENDING  Incomplete  CULTURE, BLOOD (ROUTINE X 2) w Reflex to ID Panel     Status: None (Preliminary result)   Collection Time: 02/13/21 11:25 AM   Specimen: BLOOD  Result Value Ref Range Status   Specimen Description BLOOD LEFT IJ  Final   Special Requests   Final    BOTTLES DRAWN AEROBIC AND  ANAEROBIC Blood Culture adequate volume   Culture   Final    NO GROWTH < 24 HOURS Performed at [facility], [address] [place], [place] [address]    Report Status PENDING  Incomplete    Coagulation Studies: No results for input(s): LABPROT, INR in the last 72 hours.  Urinalysis: Recent Labs    01/31/2021 2235  COLORURINE AMBER*  LABSPEC 1.018  PHURINE 5.0  GLUCOSEU 50*  HGBUR LARGE*  BILIRUBINUR NEGATIVE  KETONESUR 5*  PROTEINUR >=300*  NITRITE NEGATIVE  LEUKOCYTESUR NEGATIVE      Imaging: DG Abd 1 View  Result Date: 02/13/2021 CLINICAL DATA:  OG tube placement EXAM: ABDOMEN - 1 VIEW COMPARISON:  None. FINDINGS: OG tube tip is in the stomach. Nonobstructive bowel gas pattern. Lung bases clear. IMPRESSION: OG tube tip in the stomach. Electronically Signed   By: Rolm Baptise M.D.   On: 02/13/2021 01:59   CT HEAD WO CONTRAST  Result Date: 02/13/2021 CLINICAL DATA:  Altered mental status EXAM: CT HEAD WITHOUT CONTRAST TECHNIQUE: Contiguous axial images were obtained from the base of the skull through the vertex without intravenous contrast. COMPARISON:  09/16/2020 FINDINGS: Brain: No acute intracranial abnormality. Specifically, no hemorrhage, hydrocephalus, mass lesion, acute infarction, or significant intracranial injury. Vascular: No hyperdense vessel or unexpected calcification. Skull: No acute calvarial abnormality. Sinuses/Orbits: No acute findings Other: Soft tissue swelling over the  right orbit and forehead. IMPRESSION: No acute intracranial abnormality. Electronically Signed   By: Rolm Baptise M.D.   On: 02/13/2021 00:32   US RENAL  Result Date: 02/13/2021 CLINICAL DATA:  AKI EXAM: RENAL / URINARY TRACT ULTRASOUND COMPLETE COMPARISON:  None. FINDINGS: Right Kidney: Renal measurements: 9.2 x 4.7 x 5 cm = volume: 112.1 mL. Echogenicity within normal limits. No mass or hydronephrosis visualized. Left Kidney: Renal measurements: 9.9 x 4.7 x 4.3 cm = volume: 105.8 mL. Echogenicity within normal limits. No mass or hydronephrosis visualized. Bladder: Decompressed by Foley. Other: None. IMPRESSION: No hydronephrosis. Electronically Signed   By: Macy Mis M.D.   On: 02/13/2021 09:05   DG Chest Port 1 View  Result Date: 02/14/2021 CLINICAL DATA:  Acute respiratory failure EXAM: PORTABLE CHEST 1 VIEW COMPARISON:  Radiograph 09/16/2020 FINDINGS: Endotracheal tube tip terminates in the upper trachea, 7.2 cm from the carina. Transesophageal tube tip and side port distal to the GE junction, below the margins of imaging. Left IJ approach central venous catheter tip terminates at the superior cavoatrial junction. Telemetry leads and external support devices overlie the chest. New patchy and reticular opacities are present in both lungs with more focal consolidation in the right lung base. Mild airways thickening. No pneumothorax. No discernible layering effusion. Stable cardiomediastinal contours. No acute osseous or soft tissue abnormality. IMPRESSION: 1. Endotracheal tube tip terminates in the upper trachea, 7.2 cm from the carina. Could consider advancing 2-3 cm to the mid trachea. 2. Transesophageal tube tip and side port distal to the GE junction. 3. Left IJ catheter terminus at the superior cavoatrial junction. 4. New patchy and reticular opacities in both mid to lower lungs with more focal consolidation in the right lung base. Could reflect developing infection, aspiration or edema.  Electronically Signed   By: Lovena Le M.D.   On: 02/14/2021 03:14   DG Chest Port 1 View  Result Date: 02/13/2021 CLINICAL DATA:  Central line placement EXAM: PORTABLE CHEST 1 VIEW COMPARISON:  02/24/2021 FINDINGS: Endotracheal tube is 7.6 cm above the carina. Left central line is been  placed with the tip in the SVC. NG tube remains in the stomach. No pneumothorax. Heart is normal size. Lungs clear. No effusions. IMPRESSION: Left central line tip in the SVC.  No pneumothorax. No acute cardiopulmonary disease. Electronically Signed   By: Rolm Baptise M.D.   On: 02/13/2021 01:58   DG Chest Port 1 View  Result Date: 02/25/2021 CLINICAL DATA:  Drug overdose, respiratory failure EXAM: PORTABLE CHEST 1 VIEW COMPARISON:  11/07/2020 FINDINGS: Endotracheal tube is seen 5.0 cm above the carina. Nasogastric tube extends into the upper abdomen. Lungs are clear. No pneumothorax or pleural effusion. Cervical fusion hardware noted. No acute bone abnormality. IMPRESSION: Support tubes in appropriate position. No focal pulmonary infiltrate. Electronically Signed   By: Fidela Salisbury MD   On: 02/11/2021 21:56   US Abdomen Limited RUQ (LIVER/GB)  Result Date: 02/13/2021 CLINICAL DATA:  Transaminitis EXAM: ULTRASOUND ABDOMEN LIMITED RIGHT UPPER QUADRANT COMPARISON:  None. FINDINGS: Gallbladder: No gallstones or wall thickening visualized. No sonographic Murphy sign noted by sonographer. Common bile duct: Diameter: 5 mm, normal Liver: No focal lesion identified. Within normal limits in parenchymal echogenicity. Portal vein is patent on color Doppler imaging with normal direction of blood flow towards the liver. Other: None. IMPRESSION: No significant abnormality. Electronically Signed   By: Macy Mis M.D.   On: 02/13/2021 09:01     Medications:   . sodium chloride Stopped (02/13/21 0900)  . azithromycin 500 mg (02/14/21 0501)  . cefTRIAXone (ROCEPHIN)  IV 2 g (02/13/21 1305)  . dextrose    . dextrose    .  fentaNYL infusion INTRAVENOUS 250 mcg/hr (02/14/21 0600)  . norepinephrine (LEVOPHED) Adult infusion 40 mcg/min (02/14/21 0600)  . propofol (DIPRIVAN) infusion 20 mcg/kg/min (02/14/21 0600)  .  sodium bicarbonate (isotonic) infusion in sterile water 125 mL/hr at 02/13/21 2241  . vasopressin 0.03 Units/min (02/14/21 5701)   . chlorhexidine gluconate (MEDLINE KIT)  15 mL Mouth Rinse BID  . Chlorhexidine Gluconate Cloth  6 each Topical Q0600  . Chlorhexidine Gluconate Cloth  6 each Topical Daily  . dextrose  1 ampule Intravenous Once  . docusate  100 mg Per Tube BID  . feeding supplement (VITAL 1.5 CAL)  1,000 mL Per Tube Q24H  . fentaNYL (SUBLIMAZE) injection  50 mcg Intravenous Once  . folic acid  1 mg Per Tube Daily  . free water  30 mL Per Tube Q4H  . heparin  5,000 Units Subcutaneous Q8H  . hydrocortisone sod succinate (SOLU-CORTEF) inj  50 mg Intravenous Q6H  . mouth rinse  15 mL Mouth Rinse 10 times per day  . multivitamin with minerals  1 tablet Per Tube Daily  . mupirocin ointment  1 application Nasal BID  . pantoprazole (PROTONIX) IV  40 mg Intravenous QHS  . polyethylene glycol  17 g Per Tube Daily  . sodium zirconium cyclosilicate  10 g Per Tube Q6H  . thiamine  100 mg Per Tube Daily   Or  . thiamine  100 mg Intravenous Daily   docusate sodium, fentaNYL, LORazepam **OR** LORazepam, midazolam, polyethylene glycol  Assessment/ Plan:  Michael Lester is a 35 y.o. white male with substance abuse, who was admitted to [facility] on 02/06/2021 for Shortness of breath [R06.02] Overdose [T50.901A] Transaminitis [R74.01] Acute kidney injury ([place]) [N17.9]  1. Acute kidney injury: baseline creatinine of 1.5, GFR > 60 on 02/01/21.  2. Hyperkalemia 3. Metabolic acidosis 4. Hyponatremia 5. Proteinuria 6. Hypocalcemia  Patient is critically ill.  Intermittent  hemodialysis treatment.  If patient hemodynamically unstable, will transition to continuous dialysis.    LOS: 2 Mahogany [place] 4/19/20229:18 AM

## 2021-02-14 NOTE — Consult Note (Addendum)
Consultation Note Date: 02/14/2021   Patient Name: Michael Lester  DOB: 05/13/1986  MRN: [id number]  Age / Sex: 35 y.o., male  PCP: Patient, [name] (Inactive) Referring Physician: Flora Lipps, MD  Reason for Consultation: Establishing goals of care  HPI/Patient Profile: 35 year old male with known past medical history of polysubstance abuse including IV drug abuse, presenting to [facility] ED via EMS after unknown illicit substance ingestion.  Clinical Assessment and Goals of Care: Patient is in bed on ventilator. His ex-wife is at bedside. She states they have small children together. She states he has a daughter that is an adult but he has not seen her since she was 37 years old, and she has no idea how to reach her. She states he has not talked to her or his father since Christmas. She states they had to let him go from their lives for self and family preservation due to his addictions. She states they have received calls when he was in the hospital or was in jail. She states he will use "pretty much any drug put in front of him." She states she has warned him drugs would kill him and he responded "I'm tired of this world, f**k it all."   She states she has been well updated and understands he is likely at end of life either during the hospitalization or shortly after given his life style. She states his father was just discharged from this hospital today and needs to get settled in. She will bring him Thursday at 12:00 for a family meeting.     SUMMARY OF RECOMMENDATIONS   Family meeting at 12:00 Thursday.  Prognosis:   Very poor.       Primary Diagnoses: Present on Admission: . Overdose   I have reviewed the medical record, interviewed the patient and family, and examined the patient. The following aspects are pertinent.  History reviewed. No pertinent past medical history. Social History    Socioeconomic History  . Marital status: Divorced    Spouse name: Not on file  . Number of children: Not on file  . Years of education: Not on file  . Highest education level: Not on file  Occupational History  . Not on file  Tobacco Use  . Smoking status: Current Every Day Smoker    Packs/day: 0.50  . Smokeless tobacco: Never Used  Vaping Use  . Vaping Use: Never used  Substance and Sexual Activity  . Alcohol use: Not Currently  . Drug use: Yes    Types: Marijuana, Heroin, Cocaine    Comment: heroin and fentanyl  . Sexual activity: Not on file  Other Topics Concern  . Not on file  Social History Narrative  . Not on file   Social Determinants of Health   Financial Resource Strain: Not on file  Food Insecurity: Not on file  Transportation Needs: Not on file  Physical Activity: Not on file  Stress: Not on file  Social Connections: Not on file   History reviewed. No pertinent  family history. Scheduled Meds: . chlorhexidine gluconate (MEDLINE KIT)  15 mL Mouth Rinse BID  . Chlorhexidine Gluconate Cloth  6 each Topical Q0600  . Chlorhexidine Gluconate Cloth  6 each Topical Daily  . dextrose  1 ampule Intravenous Once  . docusate  100 mg Per Tube BID  . feeding supplement (PROSource TF)  45 mL Per Tube TID  . feeding supplement (VITAL 1.5 CAL)  1,000 mL Per Tube Q24H  . fentaNYL (SUBLIMAZE) injection  50 mcg Intravenous Once  . folic acid  1 mg Per Tube Daily  . free water  30 mL Per Tube Q4H  . heparin  5,000 Units Subcutaneous Q8H  . hydrocortisone sod succinate (SOLU-CORTEF) inj  50 mg Intravenous Q6H  . mouth rinse  15 mL Mouth Rinse 10 times per day  . multivitamin with minerals  1 tablet Per Tube Daily  . mupirocin ointment  1 application Nasal BID  . pantoprazole (PROTONIX) IV  40 mg Intravenous QHS  . polyethylene glycol  17 g Per Tube Daily  . thiamine  100 mg Per Tube Daily   Or  . thiamine  100 mg Intravenous Daily   Continuous Infusions: . sodium  chloride Stopped (02/13/21 0900)  . azithromycin 500 mg (02/14/21 0501)  . cefTRIAXone (ROCEPHIN)  IV 2 g (02/14/21 1300)  . dextrose    . dextrose    . fentaNYL infusion INTRAVENOUS 300 mcg/hr (02/14/21 1012)  . norepinephrine (LEVOPHED) Adult infusion 60 mcg/min (02/14/21 1512)  . propofol (DIPRIVAN) infusion 10 mcg/kg/min (02/14/21 1003)  .  sodium bicarbonate (isotonic) infusion in sterile water 125 mL/hr at 02/14/21 1000  . vasopressin 0.03 Units/min (02/14/21 1400)   PRN Meds:.docusate sodium, fentaNYL, LORazepam **OR** LORazepam, midazolam, polyethylene glycol Medications Prior to Admission:  Prior to Admission medications   Medication Sig Start Date End Date Taking? Authorizing Provider  benzonatate (TESSALON) 100 MG capsule Take 1 capsule (100 mg total) by mouth every 8 (eight) hours. Patient not taking: Reported on 01/29/2021 11/08/20   Charlann Lange, PA-C  folic acid (FOLVITE) 1 MG tablet Take 1 tablet (1 mg total) by mouth daily. Patient not taking: Reported on 02/01/2021 09/18/20   Lorella Nimrod, MD  gabapentin (NEURONTIN) 100 MG capsule Take 1 capsule (100 mg total) by mouth 3 (three) times daily. Patient not taking: No sig reported 02/02/20   Caren Griffins, MD  ibuprofen (ADVIL) 600 MG tablet Take 1 tablet (600 mg total) by mouth every 6 (six) hours as needed. Patient not taking: Reported on 02/08/2021 11/08/20   Charlann Lange, PA-C  nicotine (NICODERM CQ - DOSED IN MG/24 HOURS) 14 mg/24hr patch Place 1 patch (14 mg total) onto the skin daily. Patient not taking: No sig reported 02/02/20   Caren Griffins, MD  thiamine 100 MG tablet Take 1 tablet (100 mg total) by mouth daily. Patient not taking: Reported on 01/30/2021 09/18/20   Lorella Nimrod, MD  vitamin B-12 (CYANOCOBALAMIN) 1000 MCG tablet Take 1 tablet (1,000 mcg total) by mouth daily. Patient not taking: Reported on 02/02/2021 09/18/20   Lorella Nimrod, MD   Allergies  Allergen Reactions  . Shellfish Allergy  Anaphylaxis   Review of Systems  Unable to perform ROS   Physical Exam Constitutional:      Comments: Eyes closed. On ventilator.      Vital Signs: BP (!) 85/53   Pulse (!) 110   Temp 98.06 F (36.7 C)   Resp 19   Ht 6' 2"  (1.88 m)  Wt 80.2 kg   SpO2 91%   BMI 22.70 kg/m  Pain Scale: CPOT   Pain Score: 0-No pain   SpO2: SpO2: 91 % O2 Device:SpO2: 91 % O2 Flow Rate: .   IO: Intake/output summary:   Intake/Output Summary (Last 24 hours) at 02/14/2021 1533 Last data filed at 02/14/2021 1215 Gross per 24 hour  Intake 4925.42 ml  Output 555 ml  Net 4370.42 ml    LBM: Last BM Date: 02/14/21 Baseline Weight: Weight: 75 kg Most recent weight: Weight: 80.2 kg      Time In: 3:00 Time Out: 3:30 Time Total: 30 min Greater than 50%  of this time was spent counseling and coordinating care related to the above assessment and plan.  Signed by: Asencion Gowda, NP   Please contact Palliative Medicine Team phone at [phone] for questions and concerns.  For individual provider: See Shea Evans

## 2021-02-15 ENCOUNTER — Inpatient Hospital Stay: Payer: Self-pay

## 2021-02-15 LAB — BLOOD CULTURE ID PANEL (REFLEXED) - BCID2

## 2021-02-15 LAB — CULTURE, BLOOD (ROUTINE X 2): Special Requests: ADEQUATE

## 2021-02-15 LAB — GLUCOSE, CAPILLARY
Glucose-Capillary: 14 mg/dL — CL (ref 70–99)
Glucose-Capillary: <10 mg/dL — CL (ref 70–99)
Glucose-Capillary: 13 mg/dL — CL (ref 70–99)

## 2021-02-15 LAB — LACTIC ACID, PLASMA: Lactic Acid, Venous: >11 mmol/L (ref 0.5–1.9)

## 2021-02-15 MED ORDER — MORPHINE BOLUS VIA INFUSION
2.0000 mg | INTRAVENOUS | Status: DC | PRN
Start: 2021-02-15 — End: 2021-02-15
  Filled 2021-02-15: qty 2

## 2021-02-15 MED ORDER — MORPHINE 100MG IN NS 100ML (1MG/ML) PREMIX INFUSION
1.0000 mg/h | INTRAVENOUS | Status: DC
Start: 2021-02-15 — End: 2021-02-15
  Administered 2021-02-15: 4 mg/h via INTRAVENOUS
  Filled 2021-02-15: qty 100

## 2021-02-15 MED ORDER — LORAZEPAM 2 MG/ML IJ SOLN: 2.0000 mg | INTRAMUSCULAR | Status: DC | PRN

## 2021-02-15 MED ORDER — GLYCOPYRROLATE 0.2 MG/ML IJ SOLN: 0.4000 mg | INTRAMUSCULAR | Status: DC | PRN

## 2021-02-15 MED ORDER — PHENYLEPHRINE CONCENTRATED 100MG/250ML (0.4 MG/ML) INFUSION SIMPLE
0.0000 ug/min | INTRAVENOUS | Status: DC
  Administered 2021-02-15: 100 ug/min via INTRAVENOUS
  Filled 2021-02-15: qty 250

## 2021-02-15 MED ORDER — SODIUM BICARBONATE 8.4 % IV SOLN
100.0000 meq | Freq: Once | INTRAVENOUS | Status: AC
  Administered 2021-02-15: 100 meq via INTRAVENOUS

## 2021-02-17 LAB — CULTURE, BLOOD (ROUTINE X 2)
Culture: NO GROWTH
Special Requests: ADEQUATE

## 2021-02-17 LAB — CULTURE, RESPIRATORY W GRAM STAIN: Gram Stain: NONE SEEN

## 2021-02-18 LAB — CULTURE, BLOOD (ROUTINE X 2)
Culture: NO GROWTH
Special Requests: ADEQUATE

## 2021-02-20 LAB — CULTURE, BLOOD (ROUTINE X 2): Special Requests: ADEQUATE

## 2021-02-26 NOTE — Progress Notes (Signed)
Pt is maxed out on all pressors (vaso, levo, and NEO), BP continues to decrease, with an arterial BP of 78/30, HR 99. Epi is maxed out at 20 mcg, Bicarb still running and pt too unstable to begin CRRT, NP contacted/updated family, pt made into DNR, family currently at bedside.

## 2021-02-26 NOTE — Death Summary Note (Signed)
DEATH SUMMARY   Patient Details  Name: Michael Lester MRN: [id number] DOB: 17-Mar-1986  Admission/Discharge Information   Admit Date:  2021/03/03  Date of Death:  Mar 06, 2021  Time of Death:  04:37  Length of Stay: 3  Referring Physician: Patient, [name] (Inactive)   Reason(s) for Hospitalization  Drug Overdose Septic shock Acute Hypoxic Respiratory Failure Aspiration Pneumonia Acute Kidney Injury Hyperkalemia Anion gap metabolic acidosis Lactic Acidosis Acute HFrEF Liver Failure Acute Metabolic Encephalopathy  Diagnoses  Preliminary cause of death:   Drug Overdose Secondary Diagnoses (including complications and co-morbidities):  Active Problems:   Overdose   Malnutrition of moderate degree Septic shock Acute Hypoxic Respiratory Failure Aspiration Pneumonia Acute Kidney Injury Hyperkalemia Anion gap metabolic acidosis Lactic Acidosis Acute HFrEF Liver Failure Acute Metabolic Encephalopathy  Brief Hospital Course (including significant findings, care, treatment, and services provided and events leading to death)  Michael Lester is a 35 y.o. year old male with known past medical history of polysubstance abuse including IV drug abuse, presenting to [facility] ED via EMS after unknown illicit substance ingestion.  Per ED documentation EMS reported the patient had been tachycardic and hypertensive throughout transport.  Patient's mental status has been such that no further history collected, suspected methamphetamine and fentanyl overdose. ED course: Per ED documentation patient has uncontrollable movements, unable to be redirected.  While in the ED the patient throwing himself on the floor, was banging head on boards of the bed and thrashing around.  He received 4 mg of Ativan IM, bed was removed and multiple formats placed for patient safety. Initial vitals: Febrile 100.8, tachypneic at 30, tachycardic at 111, hypotensive at 74/40 with SPO2 95% on room air.  Significant labs:  Hyponatremic at 127, severely hyperkalemic at 7.4, hypochloremic at 87, high AG metabolic acidosis with serum CO2 at 12 and AG 28, acute renal failure with BUN/Cr 89/7.49, hypocalcemia at 5.8, Transaminitis: Alk phos 152, AST 1385, ALT 253, total bilirubin 1.5, BNP elevated at 125.9, troponin elevated at 321, leukocytosis at 14.3, osmolality gap Of 16 with serum osmolality at 304, serum alcohol less than 10, hypoglycemia 36.  VBG showing metabolic acidosis: 2.77/82/42/35.3. The patient continued to be altered, nonredirectable due to acute encephalopathy the decision was made for discussing the case with Dr. Cheri Fowler to emergently intubate the patient and placed on mechanical ventilation.  Dr. Juleen China was emergently consulted and is coming to assess the patient bedside.  PCCM consulted for admission.  Temporary dialysis catheter was placed for emergent hemodialysis due to acute kidney injury, metabolic acidosis and hyperkalemia.  He developed severe septic shock due to aspiration pneumonia requiring multiple vasopressors.  On 02/14/21 he developed refractory shock and severe metabolic acidosis.  He required Levophed, Vasopressin, Epinephrine, and Phenylephrine drips, along with bicarb gtt.  Lactic acid >11.0, KUB concerning for developing ileus/SBO, but no free air indicative of perforation.  Decision was made to initiate CRRT.  However given his refractory shock, unable to tolerate CRRT.  Family was updated on his rapid decline, refractory shock, and progressive multiorgan failure despite maximal medical therapy.  Family elected to make pt a DNR/DNI, and to withdraw care and  transition to comfort measures.  Pt expired shortly after care withdrawn.   Pertinent Labs and Studies  Significant Diagnostic Studies DG Abd 1 View  Result Date: 2021/03/06 CLINICAL DATA:  Vomiting EXAM: ABDOMEN - 1 VIEW COMPARISON:  02/13/2021 radiograph, 02/13/2021 ultrasound FINDINGS: Increasing diffuse and fairly uniform  gaseous distention of the small bowel throughout  the mid abdomen. Could reflect developing ileus or obstruction. Much of the distal colon is largely decompressed. Foley catheter in place. Right femoral approach lower extremity dual lumen central venous catheter is noted terminating in the IVC. A left lower extremity PICC terminates in the region of the left iliac vein as well. Telemetry leads overlie the upper abdomen. Partial visualization of the transesophageal tube which projects over the left upper quadrant with high attenuation material in the gastric lumen. IMPRESSION: 1. Increasing diffuse and fairly uniform gaseous distention of the small bowel throughout the mid abdomen. Could reflect developing ileus or obstruction. 2. High attenuation material in the gastric lumen. Electronically Signed   By: Lovena Le M.D.   On: 18-Feb-2021 00:13   DG Abd 1 View  Result Date: 02/13/2021 CLINICAL DATA:  OG tube placement EXAM: ABDOMEN - 1 VIEW COMPARISON:  None. FINDINGS: OG tube tip is in the stomach. Nonobstructive bowel gas pattern. Lung bases clear. IMPRESSION: OG tube tip in the stomach. Electronically Signed   By: Rolm Baptise M.D.   On: 02/13/2021 01:59   CT HEAD WO CONTRAST  Result Date: 02/13/2021 CLINICAL DATA:  Altered mental status EXAM: CT HEAD WITHOUT CONTRAST TECHNIQUE: Contiguous axial images were obtained from the base of the skull through the vertex without intravenous contrast. COMPARISON:  09/16/2020 FINDINGS: Brain: No acute intracranial abnormality. Specifically, no hemorrhage, hydrocephalus, mass lesion, acute infarction, or significant intracranial injury. Vascular: No hyperdense vessel or unexpected calcification. Skull: No acute calvarial abnormality. Sinuses/Orbits: No acute findings Other: Soft tissue swelling over the right orbit and forehead. IMPRESSION: No acute intracranial abnormality. Electronically Signed   By: Rolm Baptise M.D.   On: 02/13/2021 00:32   US RENAL  Result  Date: 02/13/2021 CLINICAL DATA:  AKI EXAM: RENAL / URINARY TRACT ULTRASOUND COMPLETE COMPARISON:  None. FINDINGS: Right Kidney: Renal measurements: 9.2 x 4.7 x 5 cm = volume: 112.1 mL. Echogenicity within normal limits. No mass or hydronephrosis visualized. Left Kidney: Renal measurements: 9.9 x 4.7 x 4.3 cm = volume: 105.8 mL. Echogenicity within normal limits. No mass or hydronephrosis visualized. Bladder: Decompressed by Foley. Other: None. IMPRESSION: No hydronephrosis. Electronically Signed   By: Macy Mis M.D.   On: 02/13/2021 09:05   DG Chest Port 1 View  Result Date: 02/14/2021 CLINICAL DATA:  Hypoxia EXAM: PORTABLE CHEST 1 VIEW COMPARISON:  Film from earlier in the same day. FINDINGS: Cardiac shadow is within normal limits. Endotracheal tube and gastric catheter are again seen and stable. Left jugular catheter is noted at the cavoatrial junction. No pneumothorax is seen. Lungs are well aerated bilaterally. Patchy airspace opacity is noted worst in the right base relatively stable from the prior exam given some changes in patient positioning. No bony abnormality is noted. IMPRESSION: Bilateral airspace opacities stable from the previous exam. Tubes and lines as described. Electronically Signed   By: Inez Catalina M.D.   On: 02/14/2021 19:16   DG Chest Port 1 View  Result Date: 02/14/2021 CLINICAL DATA:  Acute respiratory failure EXAM: PORTABLE CHEST 1 VIEW COMPARISON:  Radiograph 09/16/2020 FINDINGS: Endotracheal tube tip terminates in the upper trachea, 7.2 cm from the carina. Transesophageal tube tip and side port distal to the GE junction, below the margins of imaging. Left IJ approach central venous catheter tip terminates at the superior cavoatrial junction. Telemetry leads and external support devices overlie the chest. New patchy and reticular opacities are present in both lungs with more focal consolidation in the right lung base. Mild  airways thickening. No pneumothorax. No discernible  layering effusion. Stable cardiomediastinal contours. No acute osseous or soft tissue abnormality. IMPRESSION: 1. Endotracheal tube tip terminates in the upper trachea, 7.2 cm from the carina. Could consider advancing 2-3 cm to the mid trachea. 2. Transesophageal tube tip and side port distal to the GE junction. 3. Left IJ catheter terminus at the superior cavoatrial junction. 4. New patchy and reticular opacities in both mid to lower lungs with more focal consolidation in the right lung base. Could reflect developing infection, aspiration or edema. Electronically Signed   By: Lovena Le M.D.   On: 02/14/2021 03:14   DG Chest Port 1 View  Result Date: 02/13/2021 CLINICAL DATA:  Central line placement EXAM: PORTABLE CHEST 1 VIEW COMPARISON:  02/02/2021 FINDINGS: Endotracheal tube is 7.6 cm above the carina. Left central line is been placed with the tip in the SVC. NG tube remains in the stomach. No pneumothorax. Heart is normal size. Lungs clear. No effusions. IMPRESSION: Left central line tip in the SVC.  No pneumothorax. No acute cardiopulmonary disease. Electronically Signed   By: Rolm Baptise M.D.   On: 02/13/2021 01:58   DG Chest Port 1 View  Result Date: 02/14/2021 CLINICAL DATA:  Drug overdose, respiratory failure EXAM: PORTABLE CHEST 1 VIEW COMPARISON:  11/07/2020 FINDINGS: Endotracheal tube is seen 5.0 cm above the carina. Nasogastric tube extends into the upper abdomen. Lungs are clear. No pneumothorax or pleural effusion. Cervical fusion hardware noted. No acute bone abnormality. IMPRESSION: Support tubes in appropriate position. No focal pulmonary infiltrate. Electronically Signed   By: Fidela Salisbury MD   On: 02/23/2021 21:56   ECHOCARDIOGRAM COMPLETE  Result Date: 02/14/2021    ECHOCARDIOGRAM REPORT   Patient Name:   Michael Lester Date of Exam: 02/14/2021 Medical Rec #:  [id number]     Height:       74.0 in Accession #:    [id number]    Weight:       176.8 lb Date of Birth:  01/25/1986      BSA:          2.063 m Patient Age:    21 years      BP:           113/80 mmHg Patient Gender: M             HR:           106 bpm. Exam Location:  [facility] Procedure: 2D Echo, Limited Color Doppler and Cardiac Doppler Indications:     I42.9 Cardiomyopathy-unspecified  History:         Patient has prior history of Echocardiogram examinations, most                  recent 05/02/2020. Polysubstance abuse.  Sonographer:     Burle (AE) Referring Phys:  [id number] BRITTON L R[place] Diagnosing Phys: Harrell Gave End MD  Sonographer Comments: Technically challenging study due to limited acoustic windows, no parasternal window and echo performed with patient supine and on artificial respirator. IMPRESSIONS  1. Left ventricular ejection fraction, by estimation, is 35 to 40%. The left ventricle has moderately decreased function. Left ventricular endocardial border not optimally defined to evaluate regional wall motion. Left ventricular diastolic parameters are indeterminate.  2. Right ventricular systolic function is normal. The right ventricular size is normal.  3. The mitral valve was not well visualized. Unable to accurately assess mitral valve regurgitation. No evidence of mitral stenosis.  4. The aortic  valve was not well visualized. Aortic valve regurgitation is not visualized. No aortic stenosis is present.  5. The inferior vena cava is normal in size with <50% respiratory variability, suggesting right atrial pressure of 8 mmHg. FINDINGS  Left Ventricle: Left ventricular size and wall thickness are not well assessed. Left ventricular ejection fraction, by estimation, is 35 to 40%. The left ventricle has moderately decreased function. Left ventricular endocardial border not optimally defined to evaluate regional wall motion. Left ventricular diastolic parameters are indeterminate. Right Ventricle: The right ventricular size is normal. No increase in right ventricular wall thickness. Right ventricular systolic  function is normal. Left Atrium: Left atrial size was normal in size. Right Atrium: Right atrial size was normal in size. Pericardium: There is no evidence of pericardial effusion. Mitral Valve: The mitral valve was not well visualized. Unable to accurately assess mitral valve regurgitation. No evidence of mitral valve stenosis. MV peak gradient, 3.0 mmHg. The mean mitral valve gradient is 2.0 mmHg. Tricuspid Valve: The tricuspid valve is not well visualized. Tricuspid valve regurgitation is mild. Aortic Valve: The aortic valve was not well visualized. Aortic valve regurgitation is not visualized. No aortic stenosis is present. Aortic valve mean gradient measures 3.0 mmHg. Aortic valve peak gradient measures 6.0 mmHg. Pulmonic Valve: The pulmonic valve was not well visualized. Aorta: The aortic root was not well visualized. Venous: The inferior vena cava is normal in size with less than 50% respiratory variability, suggesting right atrial pressure of 8 mmHg. IAS/Shunts: The interatrial septum was not well visualized.   LV Volumes (MOD) LV vol d, MOD A4C: 175.0 ml Diastology LV vol s, MOD A4C: 114.0 ml LV e' medial:    0.09 cm/s LV SV MOD A4C:     175.0 ml LV E/e' medial:  9.3                             LV e' lateral:   0.14 cm/s                             LV E/e' lateral: 5.6  LEFT ATRIUM           Index       RIGHT ATRIUM           Index LA Vol (A4C): 29.1 ml 14.11 ml/m RA Area:     10.90 cm                                   RA Volume:   21.70 ml  10.52 ml/m  AORTIC VALVE AV Vmax:           122.00 cm/s AV Vmean:          84.800 cm/s AV VTI:            0.179 m AV Peak Grad:      6.0 mmHg AV Mean Grad:      3.0 mmHg LVOT Vmax:         94.60 cm/s LVOT Vmean:        60.900 cm/s LVOT VTI:          0.133 m LVOT/AV VTI ratio: 0.74 MITRAL VALVE MV Area (PHT): 3.39 cm   SHUNTS MV Peak grad:  3.0 mmHg   Systemic VTI: 0.13 m MV Mean grad:  2.0 mmHg MV Vmax:  0.86 m/s MV Vmean:      61.1 cm/s MV Decel Time: 224 msec  MV E velocity: 0.81 cm/s Nelva Bush MD Electronically signed by Nelva Bush MD Signature Date/Time: 02/14/2021/12:12:43 PM    Final    US Abdomen Limited RUQ (LIVER/GB)  Result Date: 02/13/2021 CLINICAL DATA:  Transaminitis EXAM: ULTRASOUND ABDOMEN LIMITED RIGHT UPPER QUADRANT COMPARISON:  None. FINDINGS: Gallbladder: No gallstones or wall thickening visualized. No sonographic Murphy sign noted by sonographer. Common bile duct: Diameter: 5 mm, normal Liver: No focal lesion identified. Within normal limits in parenchymal echogenicity. Portal vein is patent on color Doppler imaging with normal direction of blood flow towards the liver. Other: None. IMPRESSION: No significant abnormality. Electronically Signed   By: Macy Mis M.D.   On: 02/13/2021 09:01    Microbiology Recent Results (from the past 240 hour(s))  CULTURE, BLOOD (ROUTINE X 2) w Reflex to ID Panel     Status: None (Preliminary result)   Collection Time: 02/05/2021  7:48 PM   Specimen: BLOOD  Result Value Ref Range Status   Specimen Description BLOOD LEFT HAND  Final   Special Requests   Final    BOTTLES DRAWN AEROBIC AND ANAEROBIC Blood Culture adequate volume   Culture   Final    NO GROWTH 2 DAYS Performed at [facility], [address] [place], [place] [address]    Report Status PENDING  Incomplete  CULTURE, BLOOD (ROUTINE X 2) w Reflex to ID Panel     Status: None (Preliminary result)   Collection Time: 02/21/2021  7:48 PM   Specimen: BLOOD  Result Value Ref Range Status   Specimen Description   Final    BLOOD LEFT HAND Performed at [facility], [address], [place], [place] [address]    Special Requests   Final    BOTTLES DRAWN AEROBIC AND ANAEROBIC Blood Culture adequate volume Performed at [facility], [address] [place], [place] [address]    Culture  Setup Time   Final    GRAM POSITIVE COCCI ANAEROBIC BOTTLE ONLY CRITICAL RESULT CALLED TO, READ BACK BY AND  VERIFIED WITH: MYRA SLAUGHTER AT 0800 02/13/21 SDR    Culture   Final    GRAM POSITIVE COCCI IDENTIFICATION TO FOLLOW Performed at [facility], [place] [address], [place], [place] [address]    Report Status PENDING  Incomplete  Blood Culture ID Panel (Reflexed)     Status: Abnormal   Collection Time: 02/17/2021  7:48 PM  Result Value Ref Range Status   Enterococcus faecalis NOT DETECTED NOT DETECTED Final   Enterococcus Faecium NOT DETECTED NOT DETECTED Final   Listeria monocytogenes NOT DETECTED NOT DETECTED Final   Staphylococcus species NOT DETECTED NOT DETECTED Final   Staphylococcus aureus (BCID) NOT DETECTED NOT DETECTED Final   Staphylococcus epidermidis NOT DETECTED NOT DETECTED Final   Staphylococcus lugdunensis NOT DETECTED NOT DETECTED Final   Streptococcus species DETECTED (A) NOT DETECTED Final    Comment: Not Enterococcus species, Streptococcus agalactiae, Streptococcus pyogenes, or Streptococcus pneumoniae. CRITICAL RESULT CALLED TO, READ BACK BY AND VERIFIED WITH:  MYRA SLAUGHTER AT 0800 02/13/21 SDR    Streptococcus agalactiae NOT DETECTED NOT DETECTED Final   Streptococcus pneumoniae NOT DETECTED NOT DETECTED Final   Streptococcus pyogenes NOT DETECTED NOT DETECTED Final   A.calcoaceticus-baumannii NOT DETECTED NOT DETECTED Final   Bacteroides fragilis NOT DETECTED NOT DETECTED Final   Enterobacterales NOT DETECTED NOT DETECTED Final   Enterobacter cloacae complex NOT  DETECTED NOT DETECTED Final   Escherichia coli NOT DETECTED NOT DETECTED Final   Klebsiella aerogenes NOT DETECTED NOT DETECTED Final   Klebsiella oxytoca NOT DETECTED NOT DETECTED Final   Klebsiella pneumoniae NOT DETECTED NOT DETECTED Final   Proteus species NOT DETECTED NOT DETECTED Final   Salmonella species NOT DETECTED NOT DETECTED Final   Serratia marcescens NOT DETECTED NOT DETECTED Final   Haemophilus influenzae NOT DETECTED NOT DETECTED Final   Neisseria meningitidis NOT DETECTED NOT  DETECTED Final   Pseudomonas aeruginosa NOT DETECTED NOT DETECTED Final   Stenotrophomonas maltophilia NOT DETECTED NOT DETECTED Final   Candida albicans NOT DETECTED NOT DETECTED Final   Candida auris NOT DETECTED NOT DETECTED Final   Candida glabrata NOT DETECTED NOT DETECTED Final   Candida krusei NOT DETECTED NOT DETECTED Final   Candida parapsilosis NOT DETECTED NOT DETECTED Final   Candida tropicalis NOT DETECTED NOT DETECTED Final   Cryptococcus neoformans/gattii NOT DETECTED NOT DETECTED Final    Comment: Performed at [facility], [address] [place], [place] [address]  Resp Panel by RT-PCR (Flu A&B, Covid)     Status: None   Collection Time: 02/11/2021  7:57 PM  Result Value Ref Range Status   SARS Coronavirus 2 by RT PCR NEGATIVE NEGATIVE Final    Comment: (NOTE) SARS-CoV-2 target nucleic acids are NOT DETECTED.  The SARS-CoV-2 RNA is generally detectable in upper respiratory specimens during the acute phase of infection. The lowest concentration of SARS-CoV-2 viral copies this assay can detect is 138 copies/mL. A negative result does not preclude SARS-Cov-2 infection and should not be used as the sole basis for treatment or other patient management decisions. A negative result may occur with  improper specimen collection/handling, submission of specimen other than nasopharyngeal swab, presence of viral mutation(s) within the areas targeted by this assay, and inadequate number of viral copies(<138 copies/mL). A negative result must be combined with clinical observations, patient history, and epidemiological information. The expected result is Negative.  Fact Sheet for Patients:  EntrepreneurPulse.com.au  Fact Sheet for Healthcare Providers:  IncredibleEmployment.be  This test is no t yet approved or cleared by the [place] FDA and  has been authorized for detection and/or diagnosis of SARS-CoV-2 by FDA under an  Emergency Use Authorization (EUA). This EUA will remain  in effect (meaning this test can be used) for the duration of the COVID-19 declaration under Section 564(b)(1) of the Act, 21 U.S.C.section 360bbb-3(b)(1), unless the authorization is terminated  or revoked sooner.       Influenza A by PCR NEGATIVE NEGATIVE Final   Influenza B by PCR NEGATIVE NEGATIVE Final    Comment: (NOTE) The Xpert Xpress SARS-CoV-2/FLU/RSV plus assay is intended as an aid in the diagnosis of influenza from Nasopharyngeal swab specimens and should not be used as a sole basis for treatment. Nasal washings and aspirates are unacceptable for Xpert Xpress SARS-CoV-2/FLU/RSV testing.  Fact Sheet for Patients: EntrepreneurPulse.com.au  Fact Sheet for Healthcare Providers: IncredibleEmployment.be  This test is not yet approved or cleared by the [place] FDA and has been authorized for detection and/or diagnosis of SARS-CoV-2 by FDA under an Emergency Use Authorization (EUA). This EUA will remain in effect (meaning this test can be used) for the duration of the COVID-19 declaration under Section 564(b)(1) of the Act, 21 U.S.C. section 360bbb-3(b)(1), unless the authorization is terminated or revoked.  Performed at [facility], [address] [place], [place] [address]   MRSA PCR Screening  Status: Abnormal   Collection Time: 02/13/21  1:00 AM   Specimen: Nasopharyngeal  Result Value Ref Range Status   MRSA by PCR POSITIVE (A) NEGATIVE Final    Comment:        The GeneXpert MRSA Assay (FDA approved for NASAL specimens only), is one component of a comprehensive MRSA colonization surveillance program. It is not intended to diagnose MRSA infection nor to guide or monitor treatment for MRSA infections. RESULT CALLED TO, READ BACK BY AND VERIFIED WITH: NOTIFIED Donnamae Jude RN 02/13/2021 0209 [place] Performed at [facility], [address] [place], [place] [address]   CULTURE, BLOOD (ROUTINE X 2) w Reflex to ID Panel     Status: None (Preliminary result)   Collection Time: 02/13/21 11:23 AM   Specimen: BLOOD  Result Value Ref Range Status   Specimen Description BLOOD BLOOD LEFT WRIST  Final   Special Requests   Final    BOTTLES DRAWN AEROBIC ONLY Blood Culture adequate volume   Culture   Final    NO GROWTH < 24 HOURS Performed at [facility], [place], [address] [place], [place] [address]    Report Status PENDING  Incomplete  CULTURE, BLOOD (ROUTINE X 2) w Reflex to ID Panel     Status: None (Preliminary result)   Collection Time: 02/13/21 11:25 AM   Specimen: BLOOD  Result Value Ref Range Status   Specimen Description BLOOD LEFT IJ  Final   Special Requests   Final    BOTTLES DRAWN AEROBIC AND ANAEROBIC Blood Culture adequate volume   Culture   Final    NO GROWTH < 24 HOURS Performed at [facility], [address], [place], [place] [address]    Report Status PENDING  Incomplete  Culture, Respiratory w Gram Stain     Status: None (Preliminary result)   Collection Time: 02/14/21  8:50 AM   Specimen: Tracheal Aspirate; Respiratory  Result Value Ref Range Status   Specimen Description   Final    TRACHEAL ASPIRATE Performed at [facility], [address], [place], [place] [address]    Special Requests   Final    NONE Performed at [facility], [address] [place], [place] [address]    Gram Stain   Final    NO WBC SEEN FEW GRAM POSITIVE COCCI IN PAIRS Performed at [facility], [place] [address] [place],  [address]    Culture PENDING  Incomplete   Report Status PENDING  Incomplete    Lab Basic Metabolic Panel: Recent Labs  Lab 02/02/2021 1935 02/13/21 0118 02/13/21 0527 02/14/21 0448 02/14/21 2215  NA 126* 128* 127* 131* 131*  K 6.5* 6.6* 6.2* 6.4* 6.5*  CL 91* 91* 90* 87* 87*  CO2 10* 16* 15* 21* 15*  GLUCOSE 130* 157* 179* 194* 174*  BUN 88*  98* 103* 95* 56*  CREATININE 7.02* 7.54* 8.21* 7.90* 6.54*  CALCIUM 5.2* 5.5* 5.5* 4.5* 5.0*  MG 2.4  --  2.6* 2.1  --   PHOS 14.3*  --  11.7* 10.7* 17.0*   Liver Function Tests: Recent Labs  Lab 02/07/2021 1754 01/31/2021 1935 02/13/21 0527 02/14/21 2215  AST 1,385* 1,379* 1,328*  --   ALT 253* 274* 307*  --   ALKPHOS 152* 140* 130*  --   BILITOT 1.5* 1.3* 1.2  --   PROT 7.6 6.7 6.2*  --   ALBUMIN 4.1 3.8 3.3* 1.9*   Recent Labs  Lab 02/17/2021  1754  LIPASE 26   No results for input(s): AMMONIA in the last 168 hours. CBC: Recent Labs  Lab 02/25/2021 1754 02/13/21 0527 02/14/21 0448 02/14/21 2215  WBC 14.3* 8.5 11.4* 11.0*  NEUTROABS 11.4*  --   --   --   HGB 12.5* 11.7* 11.1* 8.4*  HCT 37.2* 33.9* 33.5* 27.5*  MCV 79.0* 77.6* 80.0 86.5  PLT 279 200 186 72*   Cardiac Enzymes: No results for input(s): CKTOTAL, CKMB, CKMBINDEX, TROPONINI in the last 168 hours. Sepsis Labs: Recent Labs  Lab 02/22/2021 1754 02/13/21 0118 02/13/21 0257 02/13/21 0527 02/14/21 0448 02/14/21 2213 02/14/21 2215 02/26/21 0033  PROCALCITON  --   --   --   --  85.56  --   --   --   WBC 14.3*  --   --  8.5 11.4*  --  11.0*  --   LATICACIDVEN  --  1.4 1.6  --   --  >11.0*  --  >11.0*    Procedures/Operations  02/16/2021: Endotracheal intubation 02/13/21: Left IJ CVC placed 02/13/21: Right Femoral HD catheter placed 02/14/21: Left Femoral Arterial Line placed       Darel Hong, AGACNP-BC [place] epic messenger for cross cover needs If after hours, please call E-link  Bradly Bienenstock 02-26-21, 4:45 AM

## 2021-02-26 NOTE — Progress Notes (Signed)
Pt belongings left at bedside were stored on unit and awaiting family notice. includes shirt, sweater, slides and two quarters.

## 2021-02-26 NOTE — Progress Notes (Signed)
Orders for comfort care placed by NP, morphine drip was started, pt was extubated on 0410, interventions were discontinued and patient passed away on 0437. Father, stepmother and ex-wife remained at bedside.

## 2021-02-26 NOTE — Progress Notes (Signed)
Report received from [facility] T. RN

## 2021-02-26 NOTE — Progress Notes (Signed)
Family requested spiritual support as they recognize that "Michael Lester" is at the end of life. They shared a life review and recognize that "Michael Lester" has had a long hard life and the best we can offer him is to let his body go. Family desired prayer to aid in meaning making, comfort and assurance. Prayer offered. Family is understanding and accepting of Michael Lester status and desire he be made comfortable. Father is also dealing with his own cancer diagnosis.     02/08/2021 0200  Clinical Encounter Type  Visited With Patient;Family  Visit Type Critical Care  Referral From Nurse  Spiritual Encounters  Spiritual Needs Prayer;Grief support

## 2021-02-26 NOTE — Progress Notes (Signed)
Pt's family (pt's father, step mother, and pt's ex-wife) at bedside.  Discussed his declining status, multiorgan failure, and that he is in the dying process with associated suffering.    They express they do no want him to suffer any longer, and request that we transition to COMFORT MEASURES ONLY and to WITHDRAW CARE.  DNR/DNI.    Will order for morphine gtt and prn Ativan.      Harlon Ditty, AGACNP-BC [facility] Prefer epic messenger for cross cover needs If after hours, please call E-link

## 2021-02-26 NOTE — Progress Notes (Addendum)
Pt's lactic acid >11.0, K 6.5, Bicarb 15, BUN 56, Cr. 6.54, AG 29, WBC 11, Hgb 8.4.    KUB obtained and shows increasing diffuse and fairly uniform gaseous distention of the small bowel concerning for developing ileus or obstruction (NO FREE AIR).  Pt is currently maxed out on 3 vasopressors and BP remains very labile, is not stable for trip to CT currently.  OG tube is to LIS, and Flagyl was added previously for intraabdominal coverage.   Called and spoke with pt's father Anden Bartolo of pt's decline with multiorgan failure and severe hypotension.  We discussed that pt is CRITICALLY ill with high likelihood for cardiac arrest, and will likely not survive the next 12-24 hours.  Prognosis is extremely guarded.  We discussed code status and that pt is currently receiving maximum medical therapy but continues to decline.  Mr. Azbill is in agreement with changing code status to DNR/DNI, but would like to continue all other aggressive medical care for now and see what happens.  He is en route to hospital, will allow for end of life visitation policy.  Palliative Care is to meet with family later today.      Harlon Ditty, AGACNP-BC [facility] Prefer epic messenger for cross cover needs If after hours, please call E-link

## 2021-02-26 DEATH — deceased

## 2021-03-07 LAB — BLOOD GAS, ARTERIAL
Acid-base deficit: 20.4 mmol/L — ABNORMAL HIGH (ref 0.0–2.0)
Bicarbonate: 11.7 mmol/L — ABNORMAL LOW (ref 20.0–28.0)
FIO2: 100
O2 Saturation: 99 %
PEEP: 10 cmH2O
Patient temperature: 37
RATE: 24 resp/min
pCO2 arterial: 57 mmHg — ABNORMAL HIGH (ref 32.0–48.0)
pH, Arterial: 6.92 — CL (ref 7.350–7.450)
pO2, Arterial: 197 mmHg — ABNORMAL HIGH (ref 83.0–108.0)

## 2021-09-01 IMAGING — CT CT MAXILLOFACIAL W/ CM
3 series · 15 of 47 positions shown, 18 images · IV contrast (omnipaque)
Comparison: None.

CLINICAL DATA: 34-year-old male with left side face and lip
swelling and redness onset this morning.

EXAM:
CT MAXILLOFACIAL WITH CONTRAST
TECHNIQUE: Multidetector CT imaging of the maxillofacial structures was
performed with intravenous contrast. Multiplanar CT image
reconstructions were also generated.
CONTRAST:  75mL OMNIPAQUE IOHEXOL 300 MG/ML  SOLN

[Series 3: facialbone 2.0 st · axial · 0.39mm/px · z∈[-258,-100]mm · 9 of 93 slices shown, 12 images]
[im 7/93  brain]
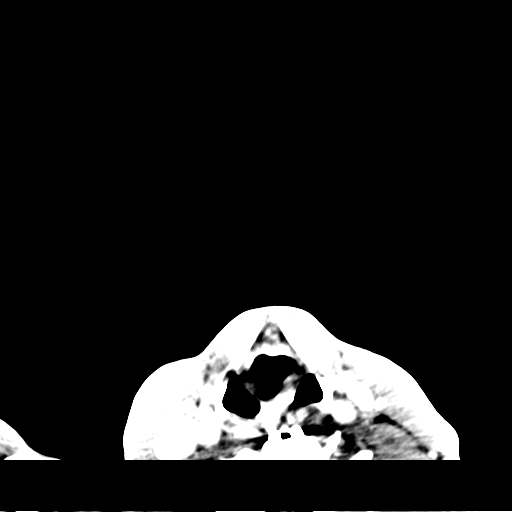
[im 7/93  bone]
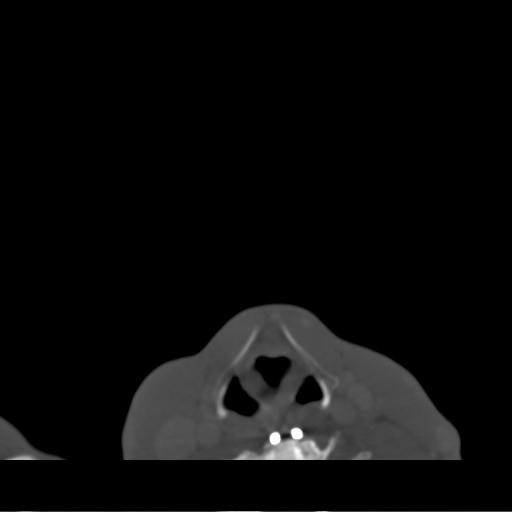
[im 16/93  bone]
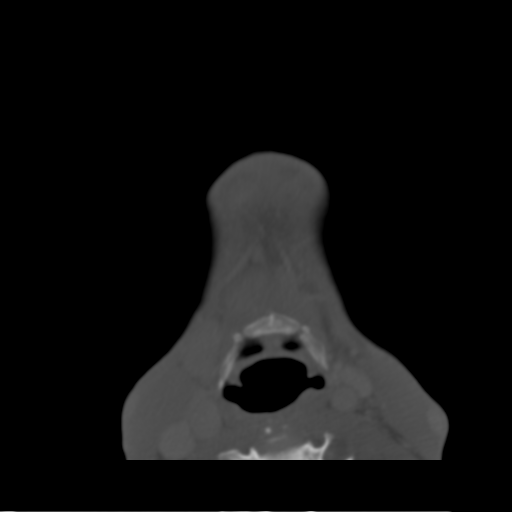
[im 26/93  bone]
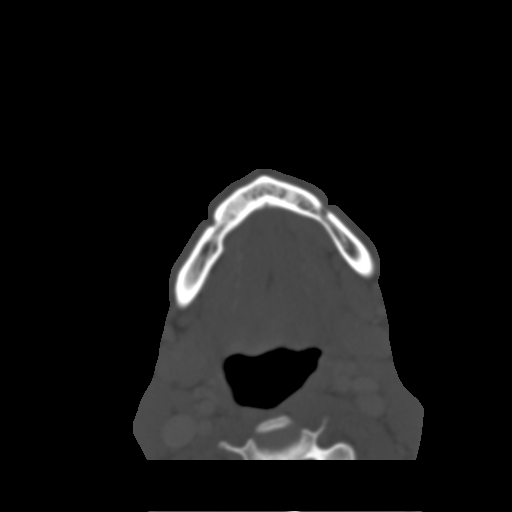
[im 35/93  bone]
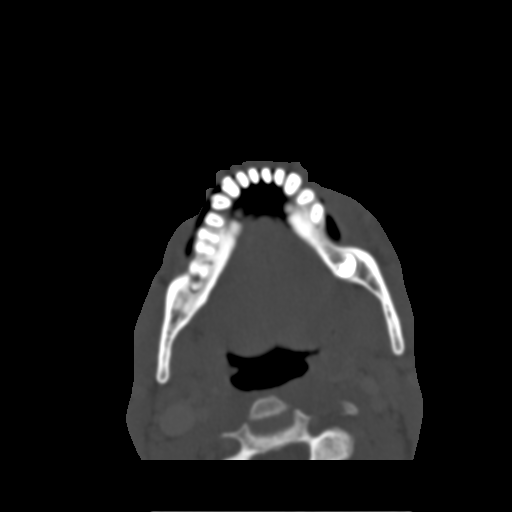
[im 48/93  brain]
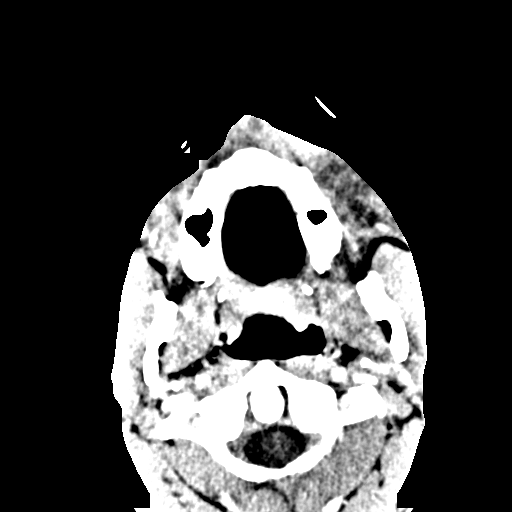
[im 48/93  bone]
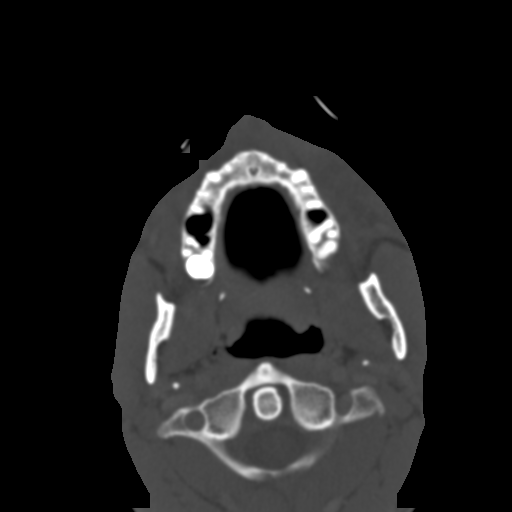
[im 58/93  bone]
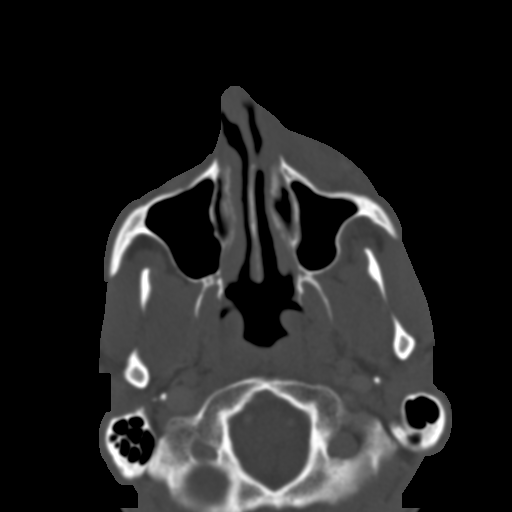
[im 67/93  bone]
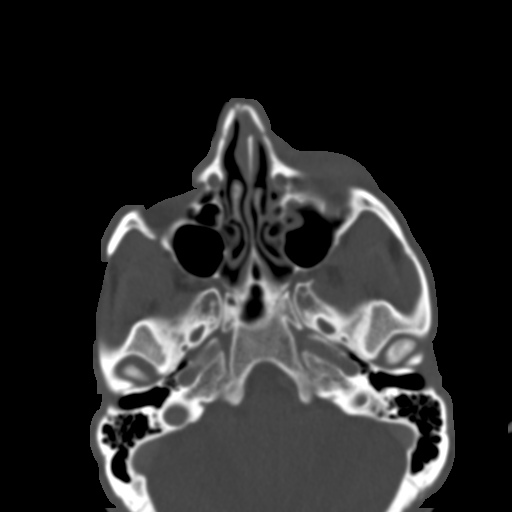
[im 77/93  bone]
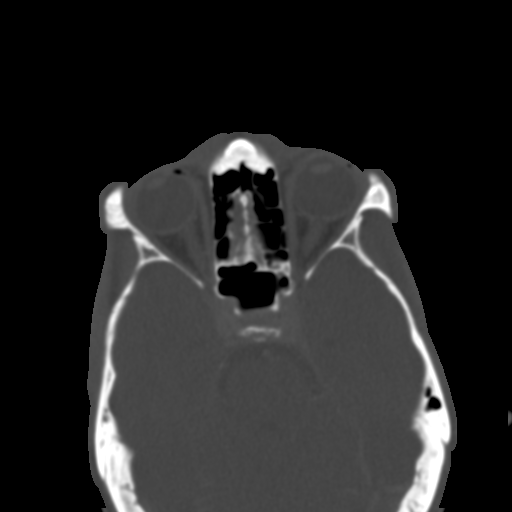
[im 86/93  brain]
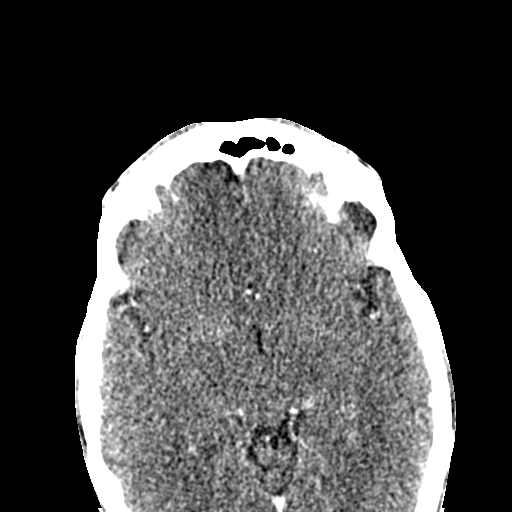
[im 86/93  bone]
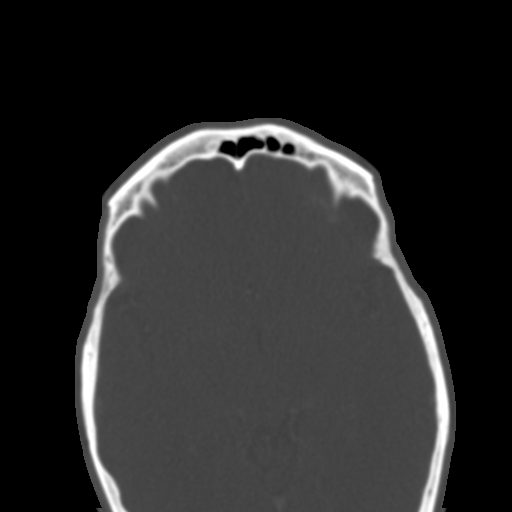

[Series 7: facialbone 2.0 cor st · coronal · 0.33mm/px · 3 of 81 slices shown]
[im 27/81  bone]
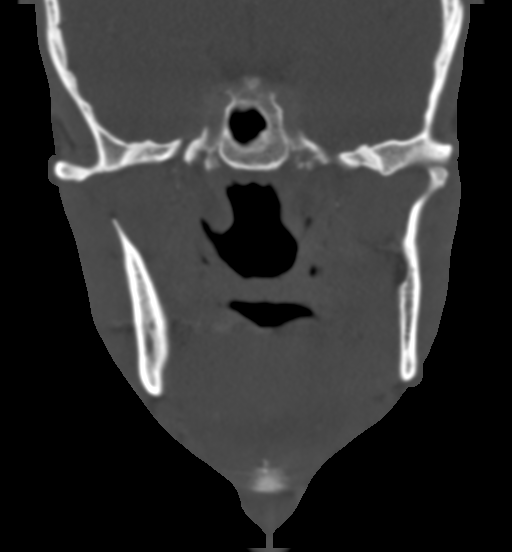
[im 36/81  bone]
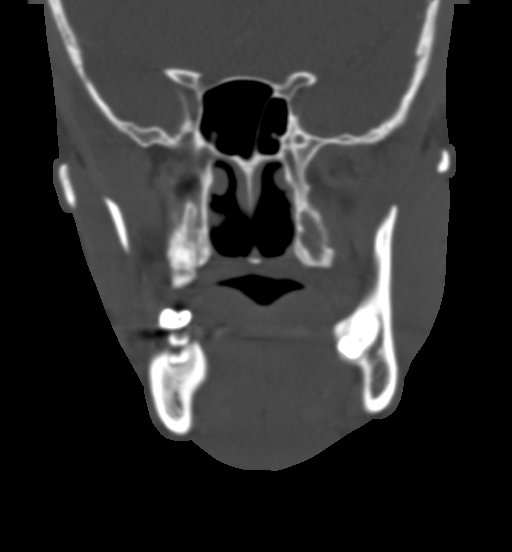
[im 45/81  bone]
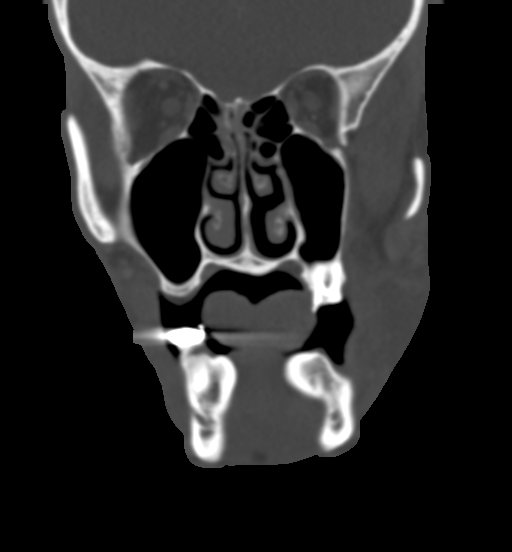

[Series 8: facialbone 2.0 sag st · sagittal · 0.36mm/px · 3 of 76 slices shown]
[im 26/76  bone]
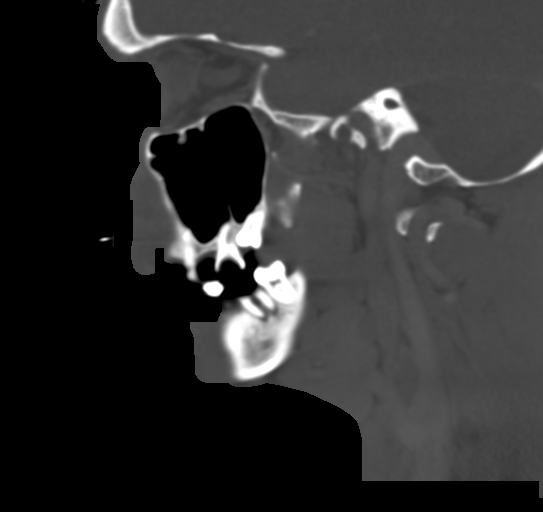
[im 38/76  bone]
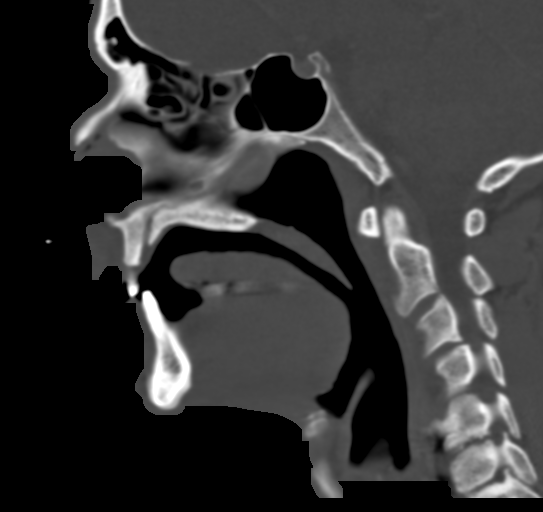
[im 51/76  bone]
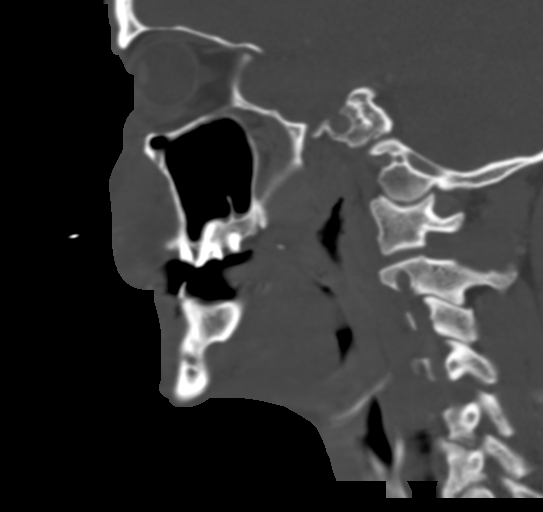

[15 of 47 positions shown; findings below may reference images not displayed]

FINDINGS: Osseous: Carious dentition throughout. But no periapical lucency or
cortical breakthrough identified at the left maxilla or mandible
alveolar process.

Mandible is intact and normally located. No acute osseous
abnormality identified. Partially visible prior cervical ACDF
beginning at C5. Scant if any C5-C6 arthrodesis.

Orbits: Intact orbital walls. Moderate left orbit preseptal and
premalar soft tissue swelling and stranding. No periorbital gas or
fluid collection. The right orbit appear spared. Mildly Disconjugate
gaze. The globes appear intact. No postseptal inflammation
identified.

Sinuses: Paranasal sinuses are clear bilaterally. Tympanic cavities
and mastoids are clear.

Soft tissues: Bulky soft tissue swelling and stranding along the
left anterior face extending to the left nasal labial fold. No soft
tissue gas. No organized or drainable fluid collection identified.

The left masticator space and other deep soft tissue spaces appear
spared. Reactive appearing left level 1 B lymph node measuring 10 mm
short axis on series 3, image 25. Visible level 2 nodes appear
fairly symmetric and within normal limits.

Limited intracranial: Major vascular structures in the visible neck
and at the skull base are patent including the left IJ. Cavernous
sinus enhancement appears symmetric. Negative visible brain
parenchyma.
IMPRESSION: Left face and preseptal periorbital cellulitis.

Carious dentition, but no obvious dental source.

No postseptal involvement or abscess identified.

Reactive left level 1 lymph nodes.
